# Patient Record
Sex: Female | Born: 1935
Health system: Southern US, Community
[De-identification: ages and names within clinical notes are randomized; demographics above are authoritative.]

## PROBLEM LIST (undated history)

## (undated) DIAGNOSIS — D131 Benign neoplasm of stomach: Secondary | ICD-10-CM

## (undated) DIAGNOSIS — M79606 Pain in leg, unspecified: Secondary | ICD-10-CM

## (undated) DIAGNOSIS — E039 Hypothyroidism, unspecified: Secondary | ICD-10-CM

## (undated) DIAGNOSIS — J40 Bronchitis, not specified as acute or chronic: Secondary | ICD-10-CM

## (undated) DIAGNOSIS — I10 Essential (primary) hypertension: Secondary | ICD-10-CM

## (undated) DIAGNOSIS — D649 Anemia, unspecified: Secondary | ICD-10-CM

## (undated) HISTORY — PX: NASAL SINUS SURGERY: SHX719

## (undated) HISTORY — PX: OTHER SURGICAL HISTORY: SHX169

---

## 1998-07-16 ENCOUNTER — Emergency Department (HOSPITAL_COMMUNITY): Admission: EM | Admit: 1998-07-16 | Discharge: 1998-07-16 | Payer: Self-pay | Admitting: Emergency Medicine

## 1999-11-30 ENCOUNTER — Encounter: Payer: Self-pay | Admitting: Family Medicine

## 1999-11-30 ENCOUNTER — Observation Stay (HOSPITAL_COMMUNITY): Admission: EM | Admit: 1999-11-30 | Discharge: 1999-12-01 | Payer: Self-pay | Admitting: Emergency Medicine

## 2000-09-25 ENCOUNTER — Emergency Department (HOSPITAL_COMMUNITY): Admission: EM | Admit: 2000-09-25 | Discharge: 2000-09-25 | Payer: Self-pay | Admitting: Emergency Medicine

## 2000-09-25 ENCOUNTER — Encounter: Payer: Self-pay | Admitting: Emergency Medicine

## 2000-11-03 ENCOUNTER — Encounter: Payer: Self-pay | Admitting: Emergency Medicine

## 2000-11-03 ENCOUNTER — Emergency Department (HOSPITAL_COMMUNITY): Admission: EM | Admit: 2000-11-03 | Discharge: 2000-11-03 | Payer: Self-pay | Admitting: Emergency Medicine

## 2002-02-09 ENCOUNTER — Encounter: Payer: Self-pay | Admitting: Emergency Medicine

## 2002-02-09 ENCOUNTER — Emergency Department (HOSPITAL_COMMUNITY): Admission: EM | Admit: 2002-02-09 | Discharge: 2002-02-09 | Payer: Self-pay | Admitting: Internal Medicine

## 2002-03-10 ENCOUNTER — Ambulatory Visit (HOSPITAL_COMMUNITY): Admission: RE | Admit: 2002-03-10 | Discharge: 2002-03-10 | Payer: Self-pay | Admitting: Cardiology

## 2002-03-10 ENCOUNTER — Encounter: Payer: Self-pay | Admitting: Cardiology

## 2002-11-09 ENCOUNTER — Inpatient Hospital Stay (HOSPITAL_COMMUNITY): Admission: EM | Admit: 2002-11-09 | Discharge: 2002-11-12 | Payer: Self-pay

## 2002-11-09 ENCOUNTER — Encounter: Payer: Self-pay | Admitting: Emergency Medicine

## 2007-07-15 ENCOUNTER — Ambulatory Visit (HOSPITAL_COMMUNITY): Admission: RE | Admit: 2007-07-15 | Discharge: 2007-07-15 | Payer: Self-pay | Admitting: Cardiology

## 2010-09-18 ENCOUNTER — Inpatient Hospital Stay (HOSPITAL_COMMUNITY): Admission: EM | Admit: 2010-09-18 | Discharge: 2010-09-27 | Payer: Self-pay | Admitting: Emergency Medicine

## 2010-09-21 ENCOUNTER — Encounter (INDEPENDENT_AMBULATORY_CARE_PROVIDER_SITE_OTHER): Payer: Self-pay | Admitting: Cardiology

## 2010-11-12 ENCOUNTER — Emergency Department (HOSPITAL_COMMUNITY)
Admission: EM | Admit: 2010-11-12 | Discharge: 2010-11-13 | Payer: Self-pay | Source: Home / Self Care | Admitting: Emergency Medicine

## 2011-02-06 ENCOUNTER — Other Ambulatory Visit: Payer: Self-pay | Admitting: Gastroenterology

## 2011-02-14 LAB — URINALYSIS, ROUTINE W REFLEX MICROSCOPIC
Ketones, ur: NEGATIVE mg/dL
Nitrite: NEGATIVE
Protein, ur: NEGATIVE mg/dL
pH: 7 (ref 5.0–8.0)

## 2011-02-14 LAB — POCT I-STAT, CHEM 8
Calcium, Ion: 1.21 mmol/L (ref 1.12–1.32)
Chloride: 105 mEq/L (ref 96–112)
HCT: 36 % (ref 36.0–46.0)
TCO2: 25 mmol/L (ref 0–100)

## 2011-02-15 LAB — TYPE AND SCREEN
ABO/RH(D): O POS
Antibody Screen: NEGATIVE
Unit division: 0

## 2011-02-15 LAB — BASIC METABOLIC PANEL
BUN: 1 mg/dL — ABNORMAL LOW (ref 6–23)
BUN: 2 mg/dL — ABNORMAL LOW (ref 6–23)
BUN: 2 mg/dL — ABNORMAL LOW (ref 6–23)
BUN: 5 mg/dL — ABNORMAL LOW (ref 6–23)
Calcium: 8.5 mg/dL (ref 8.4–10.5)
Calcium: 8.7 mg/dL (ref 8.4–10.5)
Calcium: 8.8 mg/dL (ref 8.4–10.5)
Calcium: 9 mg/dL (ref 8.4–10.5)
Calcium: 9.1 mg/dL (ref 8.4–10.5)
Chloride: 107 mEq/L (ref 96–112)
Chloride: 111 mEq/L (ref 96–112)
Chloride: 112 mEq/L (ref 96–112)
Creatinine, Ser: 0.92 mg/dL (ref 0.4–1.2)
Creatinine, Ser: 0.94 mg/dL (ref 0.4–1.2)
Creatinine, Ser: 1.02 mg/dL (ref 0.4–1.2)
Creatinine, Ser: 1.26 mg/dL — ABNORMAL HIGH (ref 0.4–1.2)
GFR calc Af Amer: 47 mL/min — ABNORMAL LOW (ref >60)
GFR calc Af Amer: 49 mL/min — ABNORMAL LOW (ref >60)
GFR calc Af Amer: 56 mL/min — ABNORMAL LOW (ref >60)
GFR calc Af Amer: >60 mL/min (ref >60)
GFR calc non Af Amer: 39 mL/min — ABNORMAL LOW (ref >60)
GFR calc non Af Amer: 47 mL/min — ABNORMAL LOW (ref >60)
GFR calc non Af Amer: 52 mL/min — ABNORMAL LOW (ref >60)
GFR calc non Af Amer: 53 mL/min — ABNORMAL LOW (ref >60)
GFR calc non Af Amer: 57 mL/min — ABNORMAL LOW (ref >60)
GFR calc non Af Amer: 59 mL/min — ABNORMAL LOW (ref >60)
Glucose, Bld: 108 mg/dL — ABNORMAL HIGH (ref 70–99)
Glucose, Bld: 119 mg/dL — ABNORMAL HIGH (ref 70–99)
Glucose, Bld: 91 mg/dL (ref 70–99)
Potassium: 3.4 mEq/L — ABNORMAL LOW (ref 3.5–5.1)
Potassium: 3.7 mEq/L (ref 3.5–5.1)
Potassium: 3.8 mEq/L (ref 3.5–5.1)
Sodium: 140 mEq/L (ref 135–145)
Sodium: 141 mEq/L (ref 135–145)
Sodium: 143 mEq/L (ref 135–145)

## 2011-02-15 LAB — CBC
HCT: 25.1 % — ABNORMAL LOW (ref 36.0–46.0)
HCT: 25.4 % — ABNORMAL LOW (ref 36.0–46.0)
HCT: 27 % — ABNORMAL LOW (ref 36.0–46.0)
Hemoglobin: 8.5 g/dL — ABNORMAL LOW (ref 12.0–15.0)
Hemoglobin: 8.5 g/dL — ABNORMAL LOW (ref 12.0–15.0)
Hemoglobin: 8.6 g/dL — ABNORMAL LOW (ref 12.0–15.0)
Hemoglobin: 9.1 g/dL — ABNORMAL LOW (ref 12.0–15.0)
MCH: 28.9 pg (ref 26.0–34.0)
MCHC: 32.7 g/dL (ref 30.0–36.0)
MCHC: 32.9 g/dL (ref 30.0–36.0)
MCHC: 33.1 g/dL (ref 30.0–36.0)
MCHC: 33.3 g/dL (ref 30.0–36.0)
MCHC: 33.6 g/dL (ref 30.0–36.0)
MCV: 87.4 fL (ref 78.0–100.0)
MCV: 88.5 fL (ref 78.0–100.0)
MCV: 88.9 fL (ref 78.0–100.0)
MCV: 89 fL (ref 78.0–100.0)
Platelets: 132 10*3/uL — ABNORMAL LOW (ref 150–400)
Platelets: 135 10*3/uL — ABNORMAL LOW (ref 150–400)
Platelets: 156 10*3/uL (ref 150–400)
Platelets: 207 10*3/uL (ref 150–400)
Platelets: 248 10*3/uL (ref 150–400)
RBC: 2.37 MIL/uL — ABNORMAL LOW (ref 3.87–5.11)
RBC: 2.87 MIL/uL — ABNORMAL LOW (ref 3.87–5.11)
RBC: 2.93 MIL/uL — ABNORMAL LOW (ref 3.87–5.11)
RBC: 3.09 MIL/uL — ABNORMAL LOW (ref 3.87–5.11)
RDW: 14.5 % (ref 11.5–15.5)
RDW: 14.7 % (ref 11.5–15.5)
RDW: 14.9 % (ref 11.5–15.5)
RDW: 15 % (ref 11.5–15.5)
WBC: 10.4 10*3/uL (ref 4.0–10.5)
WBC: 11.2 10*3/uL — ABNORMAL HIGH (ref 4.0–10.5)
WBC: 7.8 10*3/uL (ref 4.0–10.5)
WBC: 9.4 10*3/uL (ref 4.0–10.5)
WBC: 9.6 10*3/uL (ref 4.0–10.5)

## 2011-02-15 LAB — DIFFERENTIAL
Eosinophils Relative: 1 % (ref 0–5)
Lymphocytes Relative: 11 % — ABNORMAL LOW (ref 12–46)
Monocytes Absolute: 0.3 10*3/uL (ref 0.1–1.0)
Monocytes Relative: 3 % (ref 3–12)
Neutrophils Relative %: 85 % — ABNORMAL HIGH (ref 43–77)

## 2011-02-15 LAB — URINE MICROSCOPIC-ADD ON

## 2011-02-15 LAB — URINALYSIS, ROUTINE W REFLEX MICROSCOPIC
Bilirubin Urine: NEGATIVE
Ketones, ur: NEGATIVE mg/dL
Nitrite: NEGATIVE
Protein, ur: NEGATIVE mg/dL
Specific Gravity, Urine: 1.017 (ref 1.005–1.030)
Urobilinogen, UA: 0.2 mg/dL (ref 0.0–1.0)

## 2011-02-15 LAB — APTT: aPTT: 33 seconds (ref 24–37)

## 2011-02-15 LAB — POCT CARDIAC MARKERS: Myoglobin, poc: 102 ng/mL (ref 12–200)

## 2011-02-15 LAB — RENAL FUNCTION PANEL
Albumin: 2.8 g/dL — ABNORMAL LOW (ref 3.5–5.2)
BUN: 28 mg/dL — ABNORMAL HIGH (ref 6–23)
Chloride: 111 mEq/L (ref 96–112)
Glucose, Bld: 107 mg/dL — ABNORMAL HIGH (ref 70–99)
Potassium: 3.8 mEq/L (ref 3.5–5.1)
Sodium: 139 mEq/L (ref 135–145)

## 2011-02-15 LAB — MAGNESIUM
Magnesium: 1.4 mg/dL — ABNORMAL LOW (ref 1.5–2.5)
Magnesium: 1.7 mg/dL (ref 1.5–2.5)

## 2011-02-15 LAB — MRSA PCR SCREENING: MRSA by PCR: NEGATIVE

## 2011-02-15 LAB — PROTIME-INR: INR: 1.15 (ref 0.00–1.49)

## 2011-02-15 LAB — ABO/RH: ABO/RH(D): O POS

## 2011-02-15 LAB — PHOSPHORUS: Phosphorus: 3.1 mg/dL (ref 2.3–4.6)

## 2011-04-21 NOTE — Discharge Summary (Signed)
NAME:  Monique Lin, Monique Lin                            ACCOUNT NO.:  [id number]   MEDICAL RECORD NO.:  [id number]                   PATIENT TYPE:  INP   LOCATION:  5729                                 FACILITY:  [facility]   PHYSICIAN:  Mohan N. [name], M.D.              DATE OF BIRTH:  1927-04-10   DATE OF ADMISSION:  11/09/2002  DATE OF DISCHARGE:  11/12/2002                                 DISCHARGE SUMMARY   ADMISSION DIAGNOSES:  1. Exacerbation of asthmatic bronchitis.  2. Hypertension.  3. Morbid obesity.   FINAL DIAGNOSES:  1. Status post exacerbation of asthmatic bronchitis.  2. Hypertension.  3. Hypercholesterolemia.  4. Morbid obesity.  5. Hypothyroidism.  6. Elevated blood sugar, rule out diabetes mellitus.   DISCHARGE MEDICATIONS:  1. Hyzaar 100/25 mg one tablet daily.  2. Cardura 2 mg half tablet daily at night for one week and then one tablet     daily at night.  3. Synthroid 0.05 mg one tablet daily.  4. Lipitor 10 mg one tablet daily.  5. Advair Diskus 100/50 mcg one inhalation twice daily.  6. Prednisone 10 mg one tablet twice daily for two days, then one tablet     daily for two days, and then half tablet daily for one week.  7. Tequin 400 mg one tablet daily for five days.   ACTIVITY:  As tolerated.   DIET:  Low salt, low cholesterol, 1800 calorie ADA diet.  The patient has  been advised to avoid sweets.   FOLLOW UP:  The patient will follow up with me in one week.   CONDITION ON DISCHARGE:  Stable.   HISTORY OF PRESENT ILLNESS:  The patient is a 77 year old black female with  past medical history significant for hypertension, hypercholesterolemia,  hypothyroidism, morbid obesity, history of asthmatic bronchitis in the past.  She was admitted by Dr. Doylene Canard on November 09, 2002, with shortness of  breath associated with pleuritic chest pain, cough, and cold.  The patient  denies any angina, chest pain, denies any nausea or vomiting, diaphoresis.  She denies  fever or chills.   PAST MEDICAL HISTORY:  As above.   PAST SURGICAL HISTORY:  She had tubal ligation in 39 years ago.   HOME MEDICATIONS:  1. Coreg 3.125 mg b.i.d.  2. Hyzaar 50/12.5 mg p.o. q.d.  3. Synthroid.  4. Albuterol inhaler.   ALLERGIES:  She is allergic to CODEINE, she has stomach cramps.   SOCIAL HISTORY:  She is widowed.  No history of smoking. She is retired.   FAMILY HISTORY:  Mother died of MI at the age of 35.  Father not known.   PHYSICAL EXAMINATION:  GENERAL APPEARANCE:  She was awake, alert and  oriented x3.  VITAL SIGNS:  Blood pressure 152/68, pulse 93.  HEENT:  Conjunctivae pink.  NECK:  No JVD.  LUNGS:  Clear.  CARDIOVASCULAR:  S1  and S2 was normal.  ABDOMEN:  Soft.  EXTREMITIES:  There was no clubbing or cyanosis.  There was trace edema.  NEUROLOGIC:  Grossly intact.   LABORATORY AND ACCESSORY DATA:  Chest x-ray showed hyperinflation of lungs  and bronchial wall thickening comparable with chronic bronchitis. Hemoglobin  12.6, hematocrit 37.6, white count 5.7.  Glucose 114, BUN 14, creatinine  1.9.  Blood cultures were negative.  EKG showed normal sinus rhythm.   HOSPITAL COURSE:  The patient was admitted to the regular floor and was  started on IV antibiotics and steroids and beta agonist with improvement in  her symptoms.  IV antibiotics were switched to p.o. Tequin.  The patient did  not have any episodes of fever or wheezing during the hospital stay. Steroid  dose gradually being tapered off.  The patient will be discharged home on  the above medications and will be followed up in my office in one week.                                                Allegra Lai. [name], M.D.    MNH/MEDQ  D:  11/12/2002  T:  11/12/2002  Job:  [id number]

## 2012-10-14 ENCOUNTER — Emergency Department (HOSPITAL_COMMUNITY)
Admission: EM | Admit: 2012-10-14 | Discharge: 2012-10-14 | Disposition: A | Discharge status: Home / Self Care | Payer: Medicare Other | Attending: Emergency Medicine | Admitting: Emergency Medicine

## 2012-10-14 ENCOUNTER — Encounter (HOSPITAL_COMMUNITY): Payer: Self-pay

## 2012-10-14 DIAGNOSIS — Z79899 Other long term (current) drug therapy: Secondary | ICD-10-CM | POA: Insufficient documentation

## 2012-10-14 DIAGNOSIS — IMO0002 Reserved for concepts with insufficient information to code with codable children: Secondary | ICD-10-CM | POA: Insufficient documentation

## 2012-10-14 DIAGNOSIS — J40 Bronchitis, not specified as acute or chronic: Secondary | ICD-10-CM | POA: Insufficient documentation

## 2012-10-14 DIAGNOSIS — M79609 Pain in unspecified limb: Secondary | ICD-10-CM | POA: Insufficient documentation

## 2012-10-14 DIAGNOSIS — M5416 Radiculopathy, lumbar region: Secondary | ICD-10-CM

## 2012-10-14 DIAGNOSIS — Z7982 Long term (current) use of aspirin: Secondary | ICD-10-CM | POA: Insufficient documentation

## 2012-10-14 DIAGNOSIS — R609 Edema, unspecified: Secondary | ICD-10-CM | POA: Insufficient documentation

## 2012-10-14 DIAGNOSIS — Z8709 Personal history of other diseases of the respiratory system: Secondary | ICD-10-CM | POA: Insufficient documentation

## 2012-10-14 DIAGNOSIS — M79606 Pain in leg, unspecified: Secondary | ICD-10-CM

## 2012-10-14 DIAGNOSIS — I10 Essential (primary) hypertension: Secondary | ICD-10-CM | POA: Insufficient documentation

## 2012-10-14 HISTORY — DX: Bronchitis, not specified as acute or chronic: J40

## 2012-10-14 HISTORY — DX: Essential (primary) hypertension: I10

## 2012-10-14 MED ORDER — PREDNISONE 20 MG PO TABS: 20.0000 mg | ORAL_TABLET | Freq: Every day | ORAL | Status: DC

## 2012-10-14 NOTE — ED Notes (Addendum)
Pt transported to vascular.  °

## 2012-10-14 NOTE — ED Provider Notes (Signed)
History     CSN: [id number]  Arrival date & time 10/14/12  1349   First MD Initiated Contact with Patient 10/14/12 1751      Chief Complaint  Patient presents with  . Leg Pain    (Consider location/radiation/quality/duration/timing/severity/associated sxs/prior treatment) HPI Comments: 78 year old female with a history of bronchitis and hypertension presents emergency department complaining of left leg pain.  Onset of symptoms began around November 1 and are described as calf cramping.  Pain radiates from calf up to the back of thigh and is worse with walking and weight bearing.  Associated symptoms include bilateral leg swelling. Patient has been evaluated by her primary care physician Dr. [name] who has sent patient to the emergency department to rule out DVT with venous Doppler.  Patient denies any chest pain, shortness of breath, palpitations, cough, hemoptysis, fever, night sweats, chills, recent travel, replacement therapy.  No other complaints at this time.  The history is provided by the patient.    Past Medical History  Diagnosis Date  . Hypertension   . Bronchitis     History reviewed. No pertinent past surgical history.  History reviewed. No pertinent family history.  History  Substance Use Topics  . Smoking status: Never Smoker   . Smokeless tobacco: Not on file  . Alcohol Use: No    OB History    Grav Para Term Preterm Abortions TAB SAB Ect Mult Living                  Review of Systems  All other systems reviewed and are negative.    Allergies  Codeine  Home Medications   Current Outpatient Rx  Name  Route  Sig  Dispense  Refill  . AMLODIPINE BESYLATE 5 MG PO TABS   Oral   Take 5 mg by mouth daily.         . ASPIRIN 81 MG PO CHEW   Oral   Chew 81 mg by mouth daily.         . ATORVASTATIN CALCIUM 20 MG PO TABS   Oral   Take 20 mg by mouth daily.         Marland Kitchen CARVEDILOL 25 MG PO TABS   Oral   Take 25 mg by mouth 2 (two) times daily  with a meal.         . DOXAZOSIN MESYLATE 4 MG PO TABS   Oral   Take 4 mg by mouth at bedtime.         Marland Kitchen GABAPENTIN 300 MG PO CAPS   Oral   Take 300 mg by mouth 2 (two) times daily.         Marland Kitchen LEVOTHYROXINE SODIUM 50 MCG PO TABS   Oral   Take 50 mcg by mouth daily.         Marland Kitchen LOSARTAN POTASSIUM 25 MG PO TABS   Oral   Take 25 mg by mouth daily.         Marland Kitchen RAMIPRIL 10 MG PO CAPS   Oral   Take 10 mg by mouth 2 (two) times daily.           BP 146/76  Pulse 79  Temp 98.4 F (36.9 C) (Oral)  Resp 18  SpO2 95%  Physical Exam  Nursing note and vitals reviewed. Constitutional: She appears well-developed and well-nourished. No distress.  HENT:  Head: Normocephalic and atraumatic.  Eyes: Conjunctivae normal and EOM are normal. Pupils are equal, round, and reactive to  light.  Neck: Normal range of motion. Neck supple. Normal carotid pulses and no JVD present. Carotid bruit is not present. No rigidity. Normal range of motion present.  Cardiovascular: Normal rate, regular rhythm, S1 normal, S2 normal, normal heart sounds, intact distal pulses and normal pulses.  Exam reveals no gallop and no friction rub.   No murmur heard.      2+ pitting edema bilaterally, RRR, no aberrant sounds on auscultations, distal pulses intact, no carotid bruit or JVD.   Pulmonary/Chest: Effort normal and breath sounds normal. No accessory muscle usage or stridor. No respiratory distress. She exhibits no tenderness and no bony tenderness.  Abdominal: Bowel sounds are normal.       Soft non tender. Non pulsatile aorta.   Musculoskeletal:       Tenderness to palpation over left calf, popliteal area and posterior thigh.  No pain with range of motion.  Skin: Skin is warm, dry and intact. No rash noted. She is not diaphoretic. No cyanosis. Nails show no clubbing.    ED Course  Procedures (including critical care time)  Labs Reviewed - No data to display No results found.   No diagnosis  found.  VASCULAR LAB  PRELIMINARY PRELIMINARY PRELIMINARY PRELIMINARY  Left lower extremity venous duplex completed.  Preliminary report: Left leg is negative for deep and superficial vein thrombosis. Negative for Baker's cyst on left.  NICHOLS, FRANCES,  10/14/2012, 6:11 PM   MDM  Left leg pain, ? Lumbar radiculopathy , swelling  78 year old female presented to ER her [facility] care physician recommendation (Dr. [name]) for a venous Doppler to rule out DVT of left lower extremity.  As above no DVT seen.  Patient is to followup with primary care for other possible etiologies of left leg pain.  Patient appears reliable source for followup and is agreeable with plan of discharge.  Dc w 20 mg pred x 5 days for back pain.        Verl Dicker, PA-C 10/14/12 [place], PA-C 10/14/12 [id number]

## 2012-10-14 NOTE — ED Notes (Signed)
Pt c/o pain in the back of her calf radiating up to her lower back x4 weeks. States it is a 3/10 when laying in bed and an 8/10 when she stands up.

## 2012-10-14 NOTE — Progress Notes (Signed)
VASCULAR LAB PRELIMINARY  PRELIMINARY  PRELIMINARY  PRELIMINARY  Left lower extremity venous duplex completed.    Preliminary report:  Left leg is negative for deep and superficial vein thrombosis.  Negative for Baker's cyst on left.  Emmi Wertheim, [place], 10/14/2012, 6:11 PM

## 2012-10-14 NOTE — ED Notes (Signed)
Pt c/o left leg cramping and pain x several weeks intermittently; pt sent here by Dr [name] for eval and venous doppler

## 2012-10-14 NOTE — ED Notes (Signed)
Pt returned from vascular

## 2012-10-14 NOTE — ED Provider Notes (Signed)
Monique Lin is a 78 y.o. female  who is here for a tight feeling in her left leg. No recent trauma. She takes Tylenol with improvement of the pain. On exam, the left leg is nontender to patient. There is mild peripheral edema bh lower legs. Normal range of motion of the low back..  Evaluation is consistent with lumbar radiculopathy, mild. No associated symptoms to be concerning for cauda equina syndrome. She is stable for discharge   Medical screening examination/treatment/procedure(s) were conducted as a shared visit with non-physician practitioner(s) and myself.  I personally evaluated the patient during the encounter  Richarda Blade, MD 10/15/12 [phone]

## 2012-12-19 ENCOUNTER — Emergency Department (HOSPITAL_COMMUNITY): Payer: Medicare Other

## 2012-12-19 ENCOUNTER — Encounter (HOSPITAL_COMMUNITY): Payer: Self-pay | Admitting: Emergency Medicine

## 2012-12-19 DIAGNOSIS — Z7982 Long term (current) use of aspirin: Secondary | ICD-10-CM | POA: Insufficient documentation

## 2012-12-19 DIAGNOSIS — J40 Bronchitis, not specified as acute or chronic: Secondary | ICD-10-CM | POA: Insufficient documentation

## 2012-12-19 DIAGNOSIS — I10 Essential (primary) hypertension: Secondary | ICD-10-CM | POA: Insufficient documentation

## 2012-12-19 DIAGNOSIS — Z79899 Other long term (current) drug therapy: Secondary | ICD-10-CM | POA: Insufficient documentation

## 2012-12-19 NOTE — ED Notes (Addendum)
Pt st's she has bronchitis and it has gotton worse over past 1-2 weeks.  Productive cough onset earlier tonight. Pt speaking in full sentences

## 2012-12-20 ENCOUNTER — Emergency Department (HOSPITAL_COMMUNITY)
Admission: EM | Admit: 2012-12-20 | Discharge: 2012-12-20 | Disposition: A | Discharge status: Home / Self Care | Payer: Medicare Other | Attending: Emergency Medicine | Admitting: Emergency Medicine

## 2012-12-20 DIAGNOSIS — J40 Bronchitis, not specified as acute or chronic: Secondary | ICD-10-CM

## 2012-12-20 LAB — CBC
Hemoglobin: 11.8 g/dL — ABNORMAL LOW (ref 12.0–15.0)
MCH: 30.1 pg (ref 26.0–34.0)
MCV: 87 fL (ref 78.0–100.0)
RBC: 3.92 MIL/uL (ref 3.87–5.11)

## 2012-12-20 LAB — POCT I-STAT, CHEM 8
BUN: 20 mg/dL (ref 6–23)
Calcium, Ion: 1.3 mmol/L (ref 1.13–1.30)
Creatinine, Ser: 1.2 mg/dL — ABNORMAL HIGH (ref 0.50–1.10)
TCO2: 23 mmol/L (ref 0–100)

## 2012-12-20 MED ORDER — PREDNISONE 20 MG PO TABS
60.0000 mg | ORAL_TABLET | Freq: Once | ORAL | Status: AC
  Administered 2012-12-20: 60 mg via ORAL
  Filled 2012-12-20: qty 3

## 2012-12-20 MED ORDER — PREDNISONE 10 MG PO TABS: 50.0000 mg | ORAL_TABLET | Freq: Every day | ORAL | Status: DC

## 2012-12-20 MED ORDER — ALBUTEROL SULFATE (5 MG/ML) 0.5% IN NEBU
2.5000 mg | INHALATION_SOLUTION | Freq: Once | RESPIRATORY_TRACT | Status: AC
  Administered 2012-12-20: 2.5 mg via RESPIRATORY_TRACT
  Filled 2012-12-20: qty 20

## 2012-12-20 NOTE — ED Provider Notes (Addendum)
History     CSN: [id number]  Arrival date & time 12/19/12  2305   First MD Initiated Contact with Patient 12/20/12 0022      Chief Complaint  Patient presents with  . Cough    (Consider location/radiation/quality/duration/timing/severity/associated sxs/prior treatment) Patient is a 79 y.o. female presenting with cough. The history is provided by the patient.  Cough This is a recurrent problem. The current episode started 3 to 5 hours ago. The problem occurs constantly. The cough is non-productive. Pertinent negatives include no chest pain, no chills and no sweats. Treatments tried: albuterol neb. The treatment provided no relief. She is not a smoker. Her past medical history is significant for bronchitis.    Past Medical History  Diagnosis Date  . Hypertension   . Bronchitis     History reviewed. No pertinent past surgical history.  No family history on file.  History  Substance Use Topics  . Smoking status: Never Smoker   . Smokeless tobacco: Not on file  . Alcohol Use: No    OB History    Grav Para Term Preterm Abortions TAB SAB Ect Mult Living                  Review of Systems  Constitutional: Negative for chills.  Respiratory: Positive for cough.   Cardiovascular: Negative for chest pain.  All other systems reviewed and are negative.    Allergies  Codeine  Home Medications   Current Outpatient Rx  Name  Route  Sig  Dispense  Refill  . AMLODIPINE BESYLATE 5 MG PO TABS   Oral   Take 5 mg by mouth daily.         . ASPIRIN 81 MG PO CHEW   Oral   Chew 81 mg by mouth daily.         . ATORVASTATIN CALCIUM 20 MG PO TABS   Oral   Take 20 mg by mouth daily.         Marland Kitchen CARVEDILOL 25 MG PO TABS   Oral   Take 25 mg by mouth 2 (two) times daily with a meal.         . DOXAZOSIN MESYLATE 4 MG PO TABS   Oral   Take 4 mg by mouth at bedtime.         Marland Kitchen GABAPENTIN 300 MG PO CAPS   Oral   Take 300 mg by mouth 2 (two) times daily.         Marland Kitchen  LEVOTHYROXINE SODIUM 50 MCG PO TABS   Oral   Take 50 mcg by mouth daily.         Marland Kitchen LOSARTAN POTASSIUM 25 MG PO TABS   Oral   Take 25 mg by mouth daily.         Marland Kitchen RAMIPRIL 10 MG PO CAPS   Oral   Take 10 mg by mouth 2 (two) times daily.           BP 116/98  Pulse 99  Temp 98.4 F (36.9 C) (Oral)  Resp 20  SpO2 100%  Physical Exam  Constitutional: She is oriented to person, place, and time. She appears well-developed and well-nourished.  HENT:  Head: Normocephalic and atraumatic.  Eyes: Conjunctivae normal and EOM are normal. Pupils are equal, round, and reactive to light.  Neck: Normal range of motion.  Cardiovascular: Normal rate, regular rhythm and normal heart sounds.   Pulmonary/Chest: Effort normal and breath sounds normal.  Abdominal: Soft. Bowel sounds  are normal.  Musculoskeletal: Normal range of motion.  Neurological: She is alert and oriented to person, place, and time.  Skin: Skin is warm and dry.  Psychiatric: She has a normal mood and affect. Her behavior is normal.    ED Course  Procedures (including critical care time)   Labs Reviewed  CBC   Dg Chest 2 View  12/20/2012  *RADIOLOGY REPORT*  Clinical Data: Cough, shortness of breath.  CHEST - 2 VIEW  Comparison: 09/18/2010  Findings: Mild cardiomegaly.  Tortuous atheromatous aorta.  Stable linear scarring or subsegmental atelectasis at the left lung base. No new infiltrate or overt edema.  No effusion.  Mild hyperinflation.  Spurring in the lower thoracic spine.  IMPRESSION:  1.  Stable cardiomegaly.  No acute disease.   Original Report Authenticated By: D. Wallace Going, MD      No diagnosis found.    Date: 12/20/2012  Rate: 81  Rhythm: normal sinus rhythm  QRS Axis: normal  Intervals: normal  ST/T Wave abnormalities: normal  Conduction Disutrbances: none  Narrative Interpretation: unremarkable    MDM  + bronchitis,  Will check labs,  Xray,  reassess  Improved.  Will dc to fu with  Pmd,   Ret new/worsening sxs     Kiarra Kidd Ferne Reus, MD 12/20/12 0129  Oaklen Thiam Ferne Reus, MD 12/20/12 [place], MD 12/20/12 [phone]

## 2012-12-20 NOTE — ED Notes (Signed)
Pt. States "I'm having a bronchitis flare up. I've had bronchitis for 25 years". Pt. States she used nebulizer x4 with no relief. Pt. Breathing labored, o2 100% RA.

## 2012-12-20 NOTE — ED Notes (Signed)
Pt. States "I feel like I can breathe a lot better."

## 2012-12-21 ENCOUNTER — Encounter (HOSPITAL_COMMUNITY): Payer: Self-pay | Admitting: *Deleted

## 2012-12-21 DIAGNOSIS — H9319 Tinnitus, unspecified ear: Secondary | ICD-10-CM | POA: Insufficient documentation

## 2012-12-21 DIAGNOSIS — I10 Essential (primary) hypertension: Secondary | ICD-10-CM | POA: Insufficient documentation

## 2012-12-21 DIAGNOSIS — R42 Dizziness and giddiness: Secondary | ICD-10-CM | POA: Insufficient documentation

## 2012-12-21 DIAGNOSIS — Z8709 Personal history of other diseases of the respiratory system: Secondary | ICD-10-CM | POA: Insufficient documentation

## 2012-12-21 DIAGNOSIS — Z79899 Other long term (current) drug therapy: Secondary | ICD-10-CM | POA: Insufficient documentation

## 2012-12-21 DIAGNOSIS — Z7982 Long term (current) use of aspirin: Secondary | ICD-10-CM | POA: Insufficient documentation

## 2012-12-21 NOTE — ED Notes (Signed)
Dizzy since last Tuesday, loosing in balance; off/on. Comes and goes.  ringing in the ear; having bronchitis for x 2 weeks.

## 2012-12-21 NOTE — ED Notes (Signed)
Patient asked for family to be in room with her.

## 2012-12-22 ENCOUNTER — Emergency Department (HOSPITAL_COMMUNITY)
Admission: EM | Admit: 2012-12-22 | Discharge: 2012-12-22 | Disposition: A | Discharge status: Home / Self Care | Payer: Medicare Other | Attending: Emergency Medicine | Admitting: Emergency Medicine

## 2012-12-22 ENCOUNTER — Emergency Department (HOSPITAL_COMMUNITY): Payer: Medicare Other

## 2012-12-22 DIAGNOSIS — R42 Dizziness and giddiness: Secondary | ICD-10-CM

## 2012-12-22 DIAGNOSIS — H9319 Tinnitus, unspecified ear: Secondary | ICD-10-CM

## 2012-12-22 LAB — POCT I-STAT, CHEM 8
BUN: 20 mg/dL (ref 6–23)
Calcium, Ion: 1.27 mmol/L (ref 1.13–1.30)
Chloride: 108 mEq/L (ref 96–112)
Glucose, Bld: 108 mg/dL — ABNORMAL HIGH (ref 70–99)

## 2012-12-22 MED ORDER — MECLIZINE HCL 50 MG PO TABS: 50.0000 mg | ORAL_TABLET | Freq: Three times a day (TID) | ORAL | Status: DC | PRN

## 2012-12-22 MED ORDER — MECLIZINE HCL 12.5 MG PO TABS: 25.0000 mg | ORAL_TABLET | Freq: Three times a day (TID) | ORAL | Status: DC | PRN

## 2012-12-22 NOTE — ED Notes (Signed)
Patient transported to CT 

## 2012-12-22 NOTE — ED Provider Notes (Signed)
History     CSN: [id number]  Arrival date & time 12/21/12  2152   First MD Initiated Contact with Patient 12/22/12 0031      Chief Complaint  Patient presents with  . Dizziness  . Tinnitus    (Consider location/radiation/quality/duration/timing/severity/associated sxs/prior treatment) HPI Comments: 79 year old female who presents with a complaint of dizziness described as "swimmy headedness" as well as a feeling of fullness behind her bilateral ears and tinnitus. This has been ongoing for approximately one week, she has been having difficulty with maintaining her balance occasionally but this is short lived and she is able to ambulate with a cane despite this feeling. She has no changes in her speech, changes in her vision and denies any focal weakness or numbness to her arms or legs. She does not have any history of stroke though she does have a history of bronchitis for which she was recently seen and treated approximately 36 hours ago.  Her symptoms are mild, intermittent, nothing seems to make it better or worse. She denies vertigo and denies positional changes.  The history is provided by the patient.    Past Medical History  Diagnosis Date  . Hypertension   . Bronchitis     History reviewed. No pertinent past surgical history.  No family history on file.  History  Substance Use Topics  . Smoking status: Never Smoker   . Smokeless tobacco: Not on file  . Alcohol Use: No    OB History    Grav Para Term Preterm Abortions TAB SAB Ect Mult Living                  Review of Systems  All other systems reviewed and are negative.    Allergies  Codeine  Home Medications   Current Outpatient Rx  Name  Route  Sig  Dispense  Refill  . AMLODIPINE BESYLATE 5 MG PO TABS   Oral   Take 5 mg by mouth daily.         . ASPIRIN 81 MG PO CHEW   Oral   Chew 81 mg by mouth daily.         . ATORVASTATIN CALCIUM 20 MG PO TABS   Oral   Take 20 mg by mouth daily.        . AZITHROMYCIN 250 MG PO TABS   Oral   Take 250 mg by mouth daily.         Marland Kitchen CARVEDILOL 25 MG PO TABS   Oral   Take 25 mg by mouth 2 (two) times daily with a meal.         . VITAMIN B-12 PO   Oral   Take 1 tablet by mouth daily.         Marland Kitchen DOXAZOSIN MESYLATE 4 MG PO TABS   Oral   Take 4 mg by mouth at bedtime.         Marland Kitchen FOLIC ACID 1 MG PO TABS   Oral   Take 1 mg by mouth daily.         Marland Kitchen GABAPENTIN 300 MG PO CAPS   Oral   Take 300 mg by mouth 2 (two) times daily.         Marland Kitchen LEVOTHYROXINE SODIUM 50 MCG PO TABS   Oral   Take 50 mcg by mouth daily.         Marland Kitchen LOSARTAN POTASSIUM 25 MG PO TABS   Oral   Take 25 mg by mouth  daily.         Marland Kitchen PREDNISONE 10 MG PO TABS   Oral   Take 5 tablets (50 mg total) by mouth daily.   20 tablet   0   . RAMIPRIL 10 MG PO CAPS   Oral   Take 10 mg by mouth 2 (two) times daily.         Marland Kitchen MECLIZINE HCL 12.5 MG PO TABS   Oral   Take 2 tablets (25 mg total) by mouth 3 (three) times daily as needed.   30 tablet   0     BP 149/72  Pulse 83  Temp 97.9 F (36.6 C) (Oral)  Resp 18  SpO2 94%  Physical Exam  Nursing note and vitals reviewed. Constitutional: She appears well-developed and well-nourished. No distress.  HENT:  Head: Normocephalic and atraumatic.  Mouth/Throat: Oropharynx is clear and moist. No oropharyngeal exudate.       Oropharynx is clear, nostrils are clear, tympanic membranes are clear bilaterally without effusions infections erythema or bulging of the tympanic membranes.  Eyes: Conjunctivae normal and EOM are normal. Pupils are equal, round, and reactive to light. Right eye exhibits no discharge. Left eye exhibits no discharge. No scleral icterus.  Neck: Normal range of motion. Neck supple. No JVD present. No thyromegaly present.  Cardiovascular: Normal rate, regular rhythm, normal heart sounds and intact distal pulses.  Exam reveals no gallop and no friction rub.   No murmur  heard. Pulmonary/Chest: Effort normal and breath sounds normal. No respiratory distress. She has no wheezes. She has no rales.  Abdominal: Soft. Bowel sounds are normal. She exhibits no distension and no mass. There is no tenderness.  Musculoskeletal: Normal range of motion. She exhibits no edema and no tenderness.  Lymphadenopathy:    She has no cervical adenopathy.  Neurological: She is alert. Coordination normal.       Neurologic exam:  Speech clear, pupils equal round reactive to light, extraocular movements intact  Normal peripheral visual fields Cranial nerves III through XII normal including no facial droop Follows commands, moves all extremities x4, normal strength to bilateral upper and lower extremities at all major muscle groups including grip Sensation normal to light touch and pinprick Coordination intact, no limb ataxia, finger-nose-finger normal Rapid alternating movements normal No pronator drift Gait normal, I ambulated with her through the emergency department without any difficulty. No loss of balance. No dizziness.   Skin: Skin is warm and dry. No rash noted. No erythema.  Psychiatric: She has a normal mood and affect. Her behavior is normal.    ED Course  Procedures (including critical care time)  Labs Reviewed  POCT I-STAT, CHEM 8 - Abnormal; Notable for the following:    Glucose, Bld 108 (*)     All other components within normal limits   Ct Head Wo Contrast  12/22/2012  *RADIOLOGY REPORT*  Clinical Data: 79 year old female with severe headache.  CT HEAD WITHOUT CONTRAST  Technique:  Contiguous axial images were obtained from the base of the skull through the vertex without contrast.  Comparison: 11/13/2010 CT  Findings: Mild chronic small vessel white matter ischemic changes are again noted.  No acute intracranial abnormalities are identified, including mass lesion or mass effect, hydrocephalus, extra-axial fluid collection, midline shift, hemorrhage, or acute  infarction.  The visualized bony calvarium is unremarkable. Fluid within the maxillary and sphenoid sinuses noted with opacified left frontal sinus.  IMPRESSION: No evidence of acute intracranial abnormality.  Acute paranasal sinusitis.  Mild chronic small vessel white matter ischemic changes.   Original Report Authenticated By: Margarette Canada, M.D.      1. Tinnitus   2. Dizziness       MDM  Overall the patient appears well, she has mild hypertension but has intermittent neurologic symptoms which are likely related to her in her ear, possibly Mnire's disease. We'll obtain a CT scan and basic labs to rule out other sources of the patient's intermittent off-balance sensation.   The patient symptoms have not been present throughout her entire emergency department stay. Other than some tinnitus. She has a stable gait, CT scan and lab work normal, patient stable for discharge, meclizine when necessary dizziness. Followup with ENT, referral given     Johnna Acosta, MD 12/22/12 [phone]

## 2013-02-03 ENCOUNTER — Other Ambulatory Visit: Payer: Self-pay | Admitting: Otolaryngology

## 2013-02-03 DIAGNOSIS — J329 Chronic sinusitis, unspecified: Secondary | ICD-10-CM

## 2013-05-17 ENCOUNTER — Emergency Department (HOSPITAL_COMMUNITY)
Admission: EM | Admit: 2013-05-17 | Discharge: 2013-05-17 | Disposition: A | Discharge status: Home / Self Care | Payer: Medicare Other | Attending: Emergency Medicine | Admitting: Emergency Medicine

## 2013-05-17 ENCOUNTER — Encounter (HOSPITAL_COMMUNITY): Payer: Self-pay | Admitting: Emergency Medicine

## 2013-05-17 ENCOUNTER — Emergency Department (HOSPITAL_COMMUNITY): Payer: Medicare Other

## 2013-05-17 DIAGNOSIS — R059 Cough, unspecified: Secondary | ICD-10-CM | POA: Insufficient documentation

## 2013-05-17 DIAGNOSIS — Z8709 Personal history of other diseases of the respiratory system: Secondary | ICD-10-CM | POA: Insufficient documentation

## 2013-05-17 DIAGNOSIS — Z79899 Other long term (current) drug therapy: Secondary | ICD-10-CM | POA: Insufficient documentation

## 2013-05-17 DIAGNOSIS — R05 Cough: Secondary | ICD-10-CM | POA: Insufficient documentation

## 2013-05-17 DIAGNOSIS — I10 Essential (primary) hypertension: Secondary | ICD-10-CM | POA: Insufficient documentation

## 2013-05-17 DIAGNOSIS — J9801 Acute bronchospasm: Secondary | ICD-10-CM | POA: Insufficient documentation

## 2013-05-17 DIAGNOSIS — Z85028 Personal history of other malignant neoplasm of stomach: Secondary | ICD-10-CM | POA: Insufficient documentation

## 2013-05-17 DIAGNOSIS — J189 Pneumonia, unspecified organism: Secondary | ICD-10-CM | POA: Insufficient documentation

## 2013-05-17 DIAGNOSIS — Z7982 Long term (current) use of aspirin: Secondary | ICD-10-CM | POA: Insufficient documentation

## 2013-05-17 HISTORY — DX: Benign neoplasm of stomach: D13.1

## 2013-05-17 HISTORY — DX: Pain in leg, unspecified: M79.606

## 2013-05-17 MED ORDER — CEFDINIR 300 MG PO CAPS: 300.0000 mg | ORAL_CAPSULE | Freq: Two times a day (BID) | ORAL | Status: DC

## 2013-05-17 MED ORDER — ALBUTEROL SULFATE (5 MG/ML) 0.5% IN NEBU
2.5000 mg | INHALATION_SOLUTION | Freq: Once | RESPIRATORY_TRACT | Status: AC
  Administered 2013-05-17: 2.5 mg via RESPIRATORY_TRACT
  Filled 2013-05-17: qty 0.5

## 2013-05-17 MED ORDER — PREDNISONE 20 MG PO TABS: ORAL_TABLET | ORAL | Status: DC

## 2013-05-17 MED ORDER — ALBUTEROL SULFATE (5 MG/ML) 0.5% IN NEBU
5.0000 mg | INHALATION_SOLUTION | Freq: Once | RESPIRATORY_TRACT | Status: AC
  Administered 2013-05-17: 5 mg via RESPIRATORY_TRACT

## 2013-05-17 MED ORDER — ALBUTEROL SULFATE (5 MG/ML) 0.5% IN NEBU
INHALATION_SOLUTION | RESPIRATORY_TRACT | Status: AC
  Administered 2013-05-17: 5 mg via RESPIRATORY_TRACT
  Filled 2013-05-17: qty 1

## 2013-05-17 NOTE — ED Notes (Signed)
Respiratory in room.

## 2013-05-17 NOTE — ED Notes (Signed)
NAD noted at time of d/c home 

## 2013-05-17 NOTE — ED Notes (Signed)
Pt in room from XR. Dr.Pollina in room. Pt c/o congestion and cough.

## 2013-05-17 NOTE — ED Provider Notes (Signed)
History     CSN: [id number]  Arrival date & time 05/17/13  [id number]   First MD Initiated Contact with Patient 05/17/13 1112      Chief Complaint  Patient presents with  . Wheezing    (Consider location/radiation/quality/duration/timing/severity/associated sxs/prior treatment) HPI Comments: Patient presents to the ER for evaluation of wheezing, cough and shortness of breath. Patient has had these symptoms for several days. She saw her family doctor 3 days ago and was started on Zithromax. She started to improve today, then the symptoms started all over again. She has not had any fever. She has been using albuterol at home without improvement. She has a nebulizer this morning before coming to the ER and then was given an additional nebulizer arrival to the ER. She reports that her wheezing has improved after these treatments today.  She has been experiencing nasal congestion, difficulty breathing through her nose. This has caused her to feel short of breath.  Patient is a 79 y.o. female presenting with wheezing.  Wheezing Associated symptoms: cough     Past Medical History  Diagnosis Date  . Hypertension   . Bronchitis   . Leg pain   . Stomach tumor (benign)     Past Surgical History  Procedure Laterality Date  . Stomach tumor removal    . Nasal sinus surgery      No family history on file.  History  Substance Use Topics  . Smoking status: Never Smoker   . Smokeless tobacco: Not on file  . Alcohol Use: No    OB History   Grav Para Term Preterm Abortions TAB SAB Ect Mult Living                  Review of Systems  HENT: Positive for congestion.   Respiratory: Positive for cough and wheezing.   All other systems reviewed and are negative.    Allergies  Codeine  Home Medications   Current Outpatient Rx  Name  Route  Sig  Dispense  Refill  . amLODipine (NORVASC) 5 MG tablet   Oral   Take 5 mg by mouth daily.         Marland Kitchen aspirin 81 MG chewable tablet    Oral   Chew 81 mg by mouth daily.         Marland Kitchen atorvastatin (LIPITOR) 20 MG tablet   Oral   Take 20 mg by mouth daily.         . carvedilol (COREG) 25 MG tablet   Oral   Take 25 mg by mouth 2 (two) times daily with a meal.         . Cyanocobalamin (VITAMIN B-12 PO)   Oral   Take 1 tablet by mouth daily.         Marland Kitchen doxazosin (CARDURA) 4 MG tablet   Oral   Take 4 mg by mouth at bedtime.         . gabapentin (NEURONTIN) 300 MG capsule   Oral   Take 300 mg by mouth 2 (two) times daily.         Marland Kitchen levothyroxine (SYNTHROID, LEVOTHROID) 50 MCG tablet   Oral   Take 50 mcg by mouth daily.         . ramipril (ALTACE) 10 MG capsule   Oral   Take 10 mg by mouth 2 (two) times daily.           BP 128/78  Pulse 77  Temp(Src) 98.3 F (36.8  C) (Oral)  Resp 22  SpO2 99%  Physical Exam  Constitutional: She is oriented to person, place, and time. She appears well-developed and well-nourished. No distress.  HENT:  Head: Normocephalic and atraumatic.  Right Ear: Hearing normal.  Left Ear: Hearing normal.  Nose: Nose normal.  Mouth/Throat: Oropharynx is clear and moist and mucous membranes are normal.  Eyes: Conjunctivae and EOM are normal. Pupils are equal, round, and reactive to light.  Neck: Normal range of motion. Neck supple.  Cardiovascular: Regular rhythm, S1 normal and S2 normal.  Exam reveals no gallop and no friction rub.   No murmur heard. Pulmonary/Chest: Effort normal. No respiratory distress. She has wheezes. She exhibits no tenderness.  Faint bilateral expiratory wheezing  Abdominal: Soft. Normal appearance and bowel sounds are normal. There is no hepatosplenomegaly. There is no tenderness. There is no rebound, no guarding, no tenderness at McBurney's point and negative Murphy's sign. No hernia.  Musculoskeletal: Normal range of motion.  Neurological: She is alert and oriented to person, place, and time. She has normal strength. No cranial nerve deficit or  sensory deficit. Coordination normal. GCS eye subscore is 4. GCS verbal subscore is 5. GCS motor subscore is 6.  Skin: Skin is warm, dry and intact. No rash noted. No cyanosis.  Psychiatric: She has a normal mood and affect. Her speech is normal and behavior is normal. Thought content normal.    ED Course  Procedures (including critical care time)  Labs Reviewed - No data to display Dg Chest 2 View (if Patient Has Fever And/or Copd)  05/17/2013   *RADIOLOGY REPORT*  Clinical Data: Wheezing, cough  CHEST - 2 VIEW  Comparison:  12/19/2012  Findings: Cardiomediastinal silhouette is stable.  No pulmonary edema.  There is left basilar atelectasis or infiltrate.  Stable degenerative changes thoracic spine.  IMPRESSION: No pulmonary edema.  Left basilar atelectasis or infiltrate.   Original Report Authenticated By: Lahoma Crocker, M.D.     Diagnosis: Pneumonia versus bronchitis    MDM  Patient presents to ER with respiratory symptoms for several days. She was placed on Zithromax 3 days ago. Patient has persistent symptoms. After 2 nebulizer treatments today, which has almost resolved. An additional treatment given here in the ER before discharge. X-ray shows either atelectasis or infiltrate in the left base on the lateral view. Zithromax likely will cover this if it is pneumonia, but will add Omnicef for broad-spectrum coverage. Her oxygen saturations are 95% on room air, she does not require admission. Will be given prednisone 40 mg daily for 5 days in addition to the antibiotic coverage and continue bronchodilators. Follow up with Doctor [name] this week.        Orpah Greek, MD 05/17/13 1159

## 2013-05-17 NOTE — ED Notes (Signed)
Pt presents with audible wheezing/ Family reports symptoms onset Wednesday. Pt seen by Dr [name] and given medications.

## 2018-03-17 ENCOUNTER — Other Ambulatory Visit: Payer: Self-pay

## 2018-03-17 ENCOUNTER — Emergency Department (HOSPITAL_COMMUNITY): Payer: Medicare Other

## 2018-03-17 ENCOUNTER — Encounter (HOSPITAL_COMMUNITY): Payer: Self-pay | Admitting: Emergency Medicine

## 2018-03-17 ENCOUNTER — Emergency Department (HOSPITAL_COMMUNITY)
Admission: EM | Admit: 2018-03-17 | Discharge: 2018-03-17 | Disposition: A | Discharge status: Home / Self Care | Payer: Medicare Other | Attending: Emergency Medicine | Admitting: Emergency Medicine

## 2018-03-17 DIAGNOSIS — R0602 Shortness of breath: Secondary | ICD-10-CM | POA: Diagnosis present

## 2018-03-17 DIAGNOSIS — J302 Other seasonal allergic rhinitis: Secondary | ICD-10-CM | POA: Diagnosis not present

## 2018-03-17 DIAGNOSIS — Z79899 Other long term (current) drug therapy: Secondary | ICD-10-CM | POA: Diagnosis not present

## 2018-03-17 DIAGNOSIS — Z7982 Long term (current) use of aspirin: Secondary | ICD-10-CM | POA: Diagnosis not present

## 2018-03-17 DIAGNOSIS — J45901 Unspecified asthma with (acute) exacerbation: Secondary | ICD-10-CM | POA: Insufficient documentation

## 2018-03-17 DIAGNOSIS — M2550 Pain in unspecified joint: Secondary | ICD-10-CM | POA: Diagnosis not present

## 2018-03-17 DIAGNOSIS — I10 Essential (primary) hypertension: Secondary | ICD-10-CM | POA: Diagnosis not present

## 2018-03-17 LAB — CBC
HEMATOCRIT: 33.8 % — AB (ref 36.0–46.0)
Hemoglobin: 11.4 g/dL — ABNORMAL LOW (ref 12.0–15.0)
MCH: 29.5 pg (ref 26.0–34.0)
MCHC: 33.7 g/dL (ref 30.0–36.0)
MCV: 87.3 fL (ref 78.0–100.0)
Platelets: 214 10*3/uL (ref 150–400)
RBC: 3.87 MIL/uL (ref 3.87–5.11)
RDW: 14.6 % (ref 11.5–15.5)
WBC: 4.9 10*3/uL (ref 4.0–10.5)

## 2018-03-17 LAB — I-STAT TROPONIN, ED: TROPONIN I, POC: 0.01 ng/mL (ref 0.00–0.08)

## 2018-03-17 LAB — BASIC METABOLIC PANEL
Anion gap: 11 (ref 5–15)
BUN: 8 mg/dL (ref 6–20)
CALCIUM: 9.4 mg/dL (ref 8.9–10.3)
CO2: 20 mmol/L — ABNORMAL LOW (ref 22–32)
CREATININE: 0.92 mg/dL (ref 0.44–1.00)
Chloride: 106 mmol/L (ref 101–111)
GFR calc Af Amer: >60 mL/min (ref >60)
GFR, EST NON AFRICAN AMERICAN: 54 mL/min — AB (ref >60)
Glucose, Bld: 111 mg/dL — ABNORMAL HIGH (ref 65–99)
POTASSIUM: 3.6 mmol/L (ref 3.5–5.1)
SODIUM: 137 mmol/L (ref 135–145)

## 2018-03-17 MED ORDER — PREDNISONE 20 MG PO TABS
60.0000 mg | ORAL_TABLET | Freq: Once | ORAL | Status: AC
Start: 2018-03-17 — End: 2018-03-17
  Administered 2018-03-17: 60 mg via ORAL
  Filled 2018-03-17: qty 3

## 2018-03-17 MED ORDER — PREDNISONE 20 MG PO TABS: 20.0000 mg | ORAL_TABLET | Freq: Two times a day (BID) | ORAL | 0 refills | Status: DC

## 2018-03-17 MED ORDER — ALBUTEROL SULFATE (2.5 MG/3ML) 0.083% IN NEBU
5.0000 mg | INHALATION_SOLUTION | Freq: Once | RESPIRATORY_TRACT | Status: AC
  Administered 2018-03-17: 5 mg via RESPIRATORY_TRACT
  Filled 2018-03-17: qty 6

## 2018-03-17 MED ORDER — TRAMADOL HCL 50 MG PO TABS: 50.0000 mg | ORAL_TABLET | Freq: Four times a day (QID) | ORAL | 0 refills | Status: DC | PRN

## 2018-03-17 MED ORDER — ACETAMINOPHEN 325 MG PO TABS
650.0000 mg | ORAL_TABLET | Freq: Once | ORAL | Status: AC
  Administered 2018-03-17: 650 mg via ORAL
  Filled 2018-03-17: qty 2

## 2018-03-17 NOTE — ED Notes (Signed)
Pt understood dc material. NAD noted. Scripts given dc

## 2018-03-17 NOTE — ED Notes (Signed)
Updated on wait for treatment room.  Warm blankets given.

## 2018-03-17 NOTE — Discharge Instructions (Addendum)
Your symptoms are likely from bronchitis.  We are prescribing prednisone to help with that.  Continue using your nebulizer for cough or trouble breathing.  To help your joint pain we are prescribing tramadol, but the best medicine for joint pain is usually Tylenol.  Also try using heat on your sore joints to help with the discomfort.

## 2018-03-17 NOTE — ED Triage Notes (Signed)
Patient presents to ED for assessment of shortness of breath, cough, congestion, and back and shoulder pain ("I've got arthritis") x 2 weeks, but worse last night.  Patient states she did not get any sleep from coughing last night.  Also states some mid sternal chest pain, intermittent, comes with coughing.

## 2018-03-17 NOTE — ED Provider Notes (Signed)
[facility] EM[facility] DEPARTMENT Provider Note   CSN: [id number] Arrival date & time: 03/17/18  1445     History   Chief Complaint Chief Complaint  Patient presents with  . Shortness of Breath  . Neck Pain    HPI Monique Lin is a 84 y.o. female.  She presents for evaluation of shortness of breath, cough and trouble breathing.  She states that she "always has bronchitis and sinusitis," and has been more troubled by in the last 2 days.  She denies nausea, vomiting, fever, chills, head, neck, chest or back pain.  She has been using her nebulizer at home.  She has been taking her usual medications.  No other recent illnesses and no recent evaluations by her PCP.  There are no other known modifying factors.  HPI  Past Medical History:  Diagnosis Date  . Bronchitis   . Hypertension   . Leg pain   . Stomach tumor (benign)     Patient Active Problem List   Diagnosis Date Noted  . Bronchitis     Past Surgical History:  Procedure Laterality Date  . NASAL SINUS SURGERY    . stomach tumor removal       OB History   None      Home Medications    Prior to Admission medications   Medication Sig Start Date End Date Taking? Authorizing Provider  albuterol (PROVENTIL HFA;VENTOLIN HFA) 108 (90 BASE) MCG/ACT inhaler Inhale 2 puffs into the lungs every 6 (six) hours as needed for wheezing.   Yes [provider]  albuterol (PROVENTIL) (2.5 MG/3ML) 0.083% nebulizer solution Take 2.5 mg by nebulization every 6 (six) hours as needed for wheezing.   Yes [provider]  amLODipine (NORVASC) 5 MG tablet Take 5 mg by mouth daily.   Yes [provider]  aspirin EC 81 MG tablet Take 81 mg by mouth daily.   Yes [provider]  atorvastatin (LIPITOR) 20 MG tablet Take 20 mg by mouth daily.   Yes [provider]  carvedilol (COREG) 25 MG tablet Take 25 mg by mouth 2 (two) times daily with a meal.   Yes [provider]    Cyanocobalamin (VITAMIN B-12 PO) Take 500 mg by mouth daily.    Yes [provider]  doxazosin (CARDURA) 4 MG tablet Take 4 mg by mouth daily.    Yes [provider]  fluticasone (FLONASE) 50 MCG/ACT nasal spray Place 2 sprays into the nose daily.   Yes [provider]  FOLIC ACID PO Take 884 mg by mouth daily.   Yes [provider]  gabapentin (NEURONTIN) 300 MG capsule Take 300 mg by mouth 3 (three) times daily.    Yes [provider]  levothyroxine (SYNTHROID, LEVOTHROID) 50 MCG tablet Take 50 mcg by mouth daily.   Yes [provider]  Liniments (SALONPAS PAIN RELIEF PATCH EX) Apply 1 patch topically daily as needed (pain).   Yes [provider]  losartan-hydrochlorothiazide (HYZAAR) 50-12.5 MG per tablet Take 1 tablet by mouth daily.   Yes [provider]  trolamine salicylate (ASPERCREME) 10 % cream Apply 1 application topically as needed (pain).   Yes [provider]  aspirin 81 MG chewable tablet Chew 81 mg by mouth daily.    [provider]  cefdinir (OMNICEF) 300 MG capsule Take 1 capsule (300 mg total) by mouth 2 (two) times daily. Patient not taking: Reported on 03/17/2018 05/17/13   Orpah Greek,  MD  predniSONE (DELTASONE) 20 MG tablet Take 1 tablet (20 mg total) by mouth 2 (two) times daily. 03/17/18   Daleen Bo, MD  traMADol (ULTRAM) 50 MG tablet Take 1 tablet (50 mg total) by mouth every 6 (six) hours as needed. 03/17/18   Daleen Bo, MD    Family History History reviewed. No pertinent family history.  Social History Social History   Tobacco Use  . Smoking status: Never Smoker  . Smokeless tobacco: Never Used  Substance Use Topics  . Alcohol use: No  . Drug use: No     Allergies   Codeine   Review of Systems Review of Systems  All other systems reviewed and are negative.    Physical Exam Updated Vital Signs BP (!) 175/88   Pulse 99   Temp 97.9 F (36.6  C) (Oral)   Resp (!) 23   SpO2 97%   Physical Exam  Constitutional: She is oriented to person, place, and time. She appears well-developed. She does not appear ill.  Elderly, frail  HENT:  Head: Normocephalic and atraumatic.  Right Ear: External ear normal.  Left Ear: External ear normal.  Eyes: Pupils are equal, round, and reactive to light. Conjunctivae and EOM are normal.  Neck: Normal range of motion and phonation normal. Neck supple.  Cardiovascular: Normal rate, regular rhythm and normal heart sounds.  Pulmonary/Chest: Effort normal. She exhibits no bony tenderness.  Somewhat decreased air movement bilaterally with scattered wheezes.  No rhonchi or rales.  Abdominal: Soft. There is no tenderness.  Musculoskeletal: Normal range of motion. She exhibits no edema or deformity.  Neurological: She is alert and oriented to person, place, and time. No cranial nerve deficit or sensory deficit. She exhibits normal muscle tone. Coordination normal.  Skin: Skin is warm, dry and intact.  Psychiatric: She has a normal mood and affect. Her behavior is normal. Judgment and thought content normal.  Nursing note and vitals reviewed.    ED Treatments / Results  Labs (all labs ordered are listed, but only abnormal results are displayed) Labs Reviewed  BASIC METABOLIC PANEL - Abnormal; Notable for the following components:      Result Value   CO2 20 (*)    Glucose, Bld 111 (*)    GFR calc non Af Amer 54 (*)    All other components within normal limits  CBC - Abnormal; Notable for the following components:   Hemoglobin 11.4 (*)    HCT 33.8 (*)    All other components within normal limits  I-STAT TROPONIN, ED    EKG EKG Interpretation  Date/Time:  Sunday March 17 2018 15:39:23 EDT Ventricular Rate:  96 PR Interval:  156 QRS Duration: 70 QT Interval:  342 QTC Calculation: 432 R Axis:   88 Text Interpretation:  Normal sinus rhythm Cannot rule out Anterior infarct , age undetermined  Abnormal ECG since last tracing no significant change Confirmed by Daleen Bo [phone]) on 03/17/2018 6:47:58 PM   Radiology Dg Chest 2 View  Result Date: 03/17/2018 CLINICAL DATA:  Shortness of breath with cough and congestion EXAM: CHEST - 2 VIEW COMPARISON:  05/17/2013 FINDINGS: Normal heart size and mediastinal contours. Streaky opacity in the retrocardiac and right infrahilar lung that was also seen previously. There is airway thickening and lower lung scarring on a 2011 abdominal CT. No acute osseous finding. IMPRESSION: 1. No acute finding when compared to prior. 2. Chronic lower lobe scarring seen since at least 2011 abdominal CT. Electronically Signed  By: Monte Fantasia M.D.   On: 03/17/2018 16:00    Procedures Procedures (including critical care time)  Medications Ordered in ED Medications  predniSONE (DELTASONE) tablet 60 mg (60 mg Oral Given 03/17/18 1912)  albuterol (PROVENTIL) (2.5 MG/3ML) 0.083% nebulizer solution 5 mg (5 mg Nebulization Given 03/17/18 1913)     Initial Impression / Assessment and Plan / ED Course  I have reviewed the triage vital signs and the nursing notes.  Pertinent labs & imaging results that were available during my care of the patient were reviewed by me and considered in my medical decision making (see chart for details).      Patient Vitals for the past 24 hrs:  BP Temp Temp src Pulse Resp SpO2  03/17/18 2030 (!) 175/88 - - 99 (!) 23 97 %  03/17/18 1900 (!) 198/96 - - (!) 101 16 97 %  03/17/18 1830 (!) 198/90 - - 99 14 99 %  03/17/18 1811 - 97.9 F (36.6 C) Oral - - -  03/17/18 1800 (!) 194/97 - - (!) 105 12 99 %  03/17/18 1754 (!) 192/84 - - (!) 102 - 99 %  03/17/18 1527 (!) 175/81 98.4 F (36.9 C) Oral 98 18 100 %    8:56 PM Reevaluation with update and discussion. After initial assessment and treatment, an updated evaluation reveals at this time the patient has improved air movement on auscultation.  The patient's daughter states  that the patient has diffuse arthralgia and wants her to have a narcotic pain reliever.  We discussed possibilities of treatment and agreed on tramadol.  All questions answered. [place] decision making-evaluation consistent with bronchitis, nonspecific but possibly related to seasonal allergy.  Patient plans on taking Allegra, for seasonal allergy.  She is to use her nebulizer at home, and given a prescription for prednisone.  Nonspecific arthralgias will be treated symptomatically.  Nursing Notes Reviewed/ Care Coordinated Applicable Imaging Reviewed Interpretation of Laboratory Data incorporated into ED treatment  The patient appears reasonably screened and/or stabilized for discharge and I doubt any other medical condition or other [facility] requiring further screening, evaluation, or treatment in the ED at this time prior to discharge.  Plan: Home Medications-continue usual medications, use Allegra for antihistamine; Home Treatments-rest, fluids; return here if the recommended treatment, does not improve the symptoms; Recommended follow up-PCP checkup 1 week and as needed   Final Clinical Impressions(s) / ED Diagnoses   Final diagnoses:  Moderate asthma with exacerbation, unspecified whether persistent  Seasonal allergies  Arthralgia, unspecified joint    ED Discharge Orders        Ordered    predniSONE (DELTASONE) 20 MG tablet  2 times daily     03/17/18 2054    traMADol (ULTRAM) 50 MG tablet  Every 6 hours PRN     03/17/18 2054       Daleen Bo, MD 03/17/18 2057

## 2018-04-01 ENCOUNTER — Inpatient Hospital Stay (HOSPITAL_COMMUNITY): Payer: Medicare Other

## 2018-04-01 ENCOUNTER — Inpatient Hospital Stay (HOSPITAL_COMMUNITY)
Admission: EM | Admit: 2018-04-01 | Discharge: 2018-04-25 | DRG: 871 | Disposition: A | Discharge status: Skilled Nursing Facility | Payer: Medicare Other | Attending: Cardiology | Admitting: Cardiology

## 2018-04-01 ENCOUNTER — Other Ambulatory Visit: Payer: Self-pay

## 2018-04-01 ENCOUNTER — Emergency Department (HOSPITAL_COMMUNITY): Payer: Medicare Other

## 2018-04-01 DIAGNOSIS — Z6835 Body mass index (BMI) 35.0-35.9, adult: Secondary | ICD-10-CM | POA: Diagnosis not present

## 2018-04-01 DIAGNOSIS — R6520 Severe sepsis without septic shock: Secondary | ICD-10-CM

## 2018-04-01 DIAGNOSIS — J90 Pleural effusion, not elsewhere classified: Secondary | ICD-10-CM

## 2018-04-01 DIAGNOSIS — I10 Essential (primary) hypertension: Secondary | ICD-10-CM | POA: Diagnosis present

## 2018-04-01 DIAGNOSIS — E876 Hypokalemia: Secondary | ICD-10-CM | POA: Diagnosis not present

## 2018-04-01 DIAGNOSIS — J189 Pneumonia, unspecified organism: Secondary | ICD-10-CM

## 2018-04-01 DIAGNOSIS — J45998 Other asthma: Secondary | ICD-10-CM | POA: Diagnosis present

## 2018-04-01 DIAGNOSIS — R0902 Hypoxemia: Secondary | ICD-10-CM | POA: Diagnosis present

## 2018-04-01 DIAGNOSIS — E039 Hypothyroidism, unspecified: Secondary | ICD-10-CM | POA: Diagnosis present

## 2018-04-01 DIAGNOSIS — E872 Acidosis: Secondary | ICD-10-CM | POA: Diagnosis present

## 2018-04-01 DIAGNOSIS — G9340 Encephalopathy, unspecified: Secondary | ICD-10-CM | POA: Diagnosis not present

## 2018-04-01 DIAGNOSIS — J9809 Other diseases of bronchus, not elsewhere classified: Secondary | ICD-10-CM | POA: Diagnosis present

## 2018-04-01 DIAGNOSIS — I959 Hypotension, unspecified: Secondary | ICD-10-CM | POA: Diagnosis present

## 2018-04-01 DIAGNOSIS — N179 Acute kidney failure, unspecified: Secondary | ICD-10-CM | POA: Diagnosis present

## 2018-04-01 DIAGNOSIS — R6521 Severe sepsis with septic shock: Secondary | ICD-10-CM | POA: Diagnosis present

## 2018-04-01 DIAGNOSIS — J869 Pyothorax without fistula: Secondary | ICD-10-CM | POA: Diagnosis present

## 2018-04-01 DIAGNOSIS — Z4682 Encounter for fitting and adjustment of non-vascular catheter: Secondary | ICD-10-CM

## 2018-04-01 DIAGNOSIS — J9601 Acute respiratory failure with hypoxia: Secondary | ICD-10-CM

## 2018-04-01 DIAGNOSIS — R0600 Dyspnea, unspecified: Secondary | ICD-10-CM | POA: Diagnosis not present

## 2018-04-01 DIAGNOSIS — Z9889 Other specified postprocedural states: Secondary | ICD-10-CM

## 2018-04-01 DIAGNOSIS — J42 Unspecified chronic bronchitis: Secondary | ICD-10-CM | POA: Diagnosis present

## 2018-04-01 DIAGNOSIS — R0603 Acute respiratory distress: Secondary | ICD-10-CM | POA: Diagnosis present

## 2018-04-01 DIAGNOSIS — J9690 Respiratory failure, unspecified, unspecified whether with hypoxia or hypercapnia: Secondary | ICD-10-CM

## 2018-04-01 DIAGNOSIS — J9811 Atelectasis: Secondary | ICD-10-CM | POA: Diagnosis present

## 2018-04-01 DIAGNOSIS — Z7982 Long term (current) use of aspirin: Secondary | ICD-10-CM

## 2018-04-01 DIAGNOSIS — R509 Fever, unspecified: Secondary | ICD-10-CM

## 2018-04-01 DIAGNOSIS — I2510 Atherosclerotic heart disease of native coronary artery without angina pectoris: Secondary | ICD-10-CM | POA: Diagnosis present

## 2018-04-01 DIAGNOSIS — A419 Sepsis, unspecified organism: Principal | ICD-10-CM

## 2018-04-01 DIAGNOSIS — Z9689 Presence of other specified functional implants: Secondary | ICD-10-CM

## 2018-04-01 DIAGNOSIS — J181 Lobar pneumonia, unspecified organism: Secondary | ICD-10-CM | POA: Diagnosis present

## 2018-04-01 DIAGNOSIS — J918 Pleural effusion in other conditions classified elsewhere: Secondary | ICD-10-CM | POA: Diagnosis present

## 2018-04-01 DIAGNOSIS — I472 Ventricular tachycardia: Secondary | ICD-10-CM | POA: Diagnosis present

## 2018-04-01 DIAGNOSIS — E785 Hyperlipidemia, unspecified: Secondary | ICD-10-CM | POA: Diagnosis present

## 2018-04-01 DIAGNOSIS — R54 Age-related physical debility: Secondary | ICD-10-CM | POA: Diagnosis present

## 2018-04-01 DIAGNOSIS — D638 Anemia in other chronic diseases classified elsewhere: Secondary | ICD-10-CM | POA: Diagnosis present

## 2018-04-01 LAB — DIFFERENTIAL
BASOS ABS: 0 10*3/uL (ref 0.0–0.1)
Basophils Relative: 0 %
Eosinophils Absolute: 0 10*3/uL (ref 0.0–0.7)
Eosinophils Relative: 0 %
LYMPHS ABS: 1.6 10*3/uL (ref 0.7–4.0)
Lymphocytes Relative: 6 %
MONO ABS: 1.8 10*3/uL — AB (ref 0.1–1.0)
Monocytes Relative: 7 %
NEUTROS PCT: 87 %
Neutro Abs: 22.5 10*3/uL — ABNORMAL HIGH (ref 1.7–7.7)

## 2018-04-01 LAB — URINALYSIS, ROUTINE W REFLEX MICROSCOPIC
GLUCOSE, UA: NEGATIVE mg/dL
Hgb urine dipstick: NEGATIVE
Ketones, ur: NEGATIVE mg/dL
LEUKOCYTES UA: NEGATIVE
Nitrite: NEGATIVE
Protein, ur: 30 mg/dL — AB
Specific Gravity, Urine: 1.02 (ref 1.005–1.030)
pH: 5 (ref 5.0–8.0)

## 2018-04-01 LAB — COMPREHENSIVE METABOLIC PANEL
ALBUMIN: 2.3 g/dL — AB (ref 3.5–5.0)
ALT: 16 U/L (ref 14–54)
ANION GAP: 14 (ref 5–15)
AST: 29 U/L (ref 15–41)
Alkaline Phosphatase: 106 U/L (ref 38–126)
BUN: 47 mg/dL — ABNORMAL HIGH (ref 6–20)
CHLORIDE: 100 mmol/L — AB (ref 101–111)
CO2: 20 mmol/L — AB (ref 22–32)
Calcium: 9 mg/dL (ref 8.9–10.3)
Creatinine, Ser: 3.59 mg/dL — ABNORMAL HIGH (ref 0.44–1.00)
GFR calc Af Amer: 12 mL/min — ABNORMAL LOW (ref >60)
GFR calc non Af Amer: 10 mL/min — ABNORMAL LOW (ref >60)
GLUCOSE: 150 mg/dL — AB (ref 65–99)
POTASSIUM: 4.3 mmol/L (ref 3.5–5.1)
SODIUM: 134 mmol/L — AB (ref 135–145)
Total Bilirubin: 1.5 mg/dL — ABNORMAL HIGH (ref 0.3–1.2)
Total Protein: 6.9 g/dL (ref 6.5–8.1)

## 2018-04-01 LAB — I-STAT CHEM 8, ED
BUN: 40 mg/dL — ABNORMAL HIGH (ref 6–20)
CREATININE: 3.5 mg/dL — AB (ref 0.44–1.00)
Calcium, Ion: 1.12 mmol/L — ABNORMAL LOW (ref 1.15–1.40)
Chloride: 104 mmol/L (ref 101–111)
Glucose, Bld: 147 mg/dL — ABNORMAL HIGH (ref 65–99)
HEMATOCRIT: 31 % — AB (ref 36.0–46.0)
HEMOGLOBIN: 10.5 g/dL — AB (ref 12.0–15.0)
POTASSIUM: 4.3 mmol/L (ref 3.5–5.1)
Sodium: 136 mmol/L (ref 135–145)
TCO2: 22 mmol/L (ref 22–32)

## 2018-04-01 LAB — RAPID URINE DRUG SCREEN, HOSP PERFORMED
AMPHETAMINES: NOT DETECTED
BENZODIAZEPINES: NOT DETECTED
Barbiturates: NOT DETECTED
COCAINE: NOT DETECTED
OPIATES: NOT DETECTED
TETRAHYDROCANNABINOL: NOT DETECTED

## 2018-04-01 LAB — CBC
HCT: 29.4 % — ABNORMAL LOW (ref 36.0–46.0)
HEMOGLOBIN: 9.8 g/dL — AB (ref 12.0–15.0)
MCH: 30 pg (ref 26.0–34.0)
MCHC: 33.3 g/dL (ref 30.0–36.0)
MCV: 89.9 fL (ref 78.0–100.0)
Platelets: 268 10*3/uL (ref 150–400)
RBC: 3.27 MIL/uL — ABNORMAL LOW (ref 3.87–5.11)
RDW: 14.4 % (ref 11.5–15.5)
WBC: 25.9 10*3/uL — AB (ref 4.0–10.5)

## 2018-04-01 LAB — CBG MONITORING, ED: Glucose-Capillary: 131 mg/dL — ABNORMAL HIGH (ref 65–99)

## 2018-04-01 LAB — PROCALCITONIN: Procalcitonin: 2.63 ng/mL

## 2018-04-01 LAB — I-STAT TROPONIN, ED: Troponin i, poc: 0 ng/mL (ref 0.00–0.08)

## 2018-04-01 LAB — I-STAT CG4 LACTIC ACID, ED: Lactic Acid, Venous: 1.97 mmol/L — ABNORMAL HIGH (ref 0.5–1.9)

## 2018-04-01 LAB — APTT: APTT: 39 s — AB (ref 24–36)

## 2018-04-01 LAB — ETHANOL

## 2018-04-01 LAB — PROTIME-INR
INR: 1.36
Prothrombin Time: 16.6 seconds — ABNORMAL HIGH (ref 11.4–15.2)

## 2018-04-01 MED ORDER — SODIUM CHLORIDE 0.9 % IV BOLUS
2000.0000 mL | Freq: Once | INTRAVENOUS | Status: AC
  Administered 2018-04-01: 2000 mL via INTRAVENOUS

## 2018-04-01 MED ORDER — SODIUM CHLORIDE 0.9 % IV SOLN
INTRAVENOUS | Status: DC
  Administered 2018-04-02 – 2018-04-08 (×7): via INTRAVENOUS

## 2018-04-01 MED ORDER — ASPIRIN 81 MG PO CHEW
81.0000 mg | CHEWABLE_TABLET | Freq: Every day | ORAL | Status: DC
  Administered 2018-04-02 – 2018-04-25 (×22): 81 mg via ORAL
  Filled 2018-04-01 (×22): qty 1

## 2018-04-01 MED ORDER — LEVOTHYROXINE SODIUM 50 MCG PO TABS
50.0000 ug | ORAL_TABLET | Freq: Every day | ORAL | Status: DC
  Administered 2018-04-03 – 2018-04-25 (×20): 50 ug via ORAL
  Filled 2018-04-01 (×22): qty 1

## 2018-04-01 MED ORDER — ALBUTEROL (5 MG/ML) CONTINUOUS INHALATION SOLN
10.0000 mg/h | INHALATION_SOLUTION | Freq: Once | RESPIRATORY_TRACT | Status: AC
  Administered 2018-04-01: 10 mg/h via RESPIRATORY_TRACT
  Filled 2018-04-01: qty 20

## 2018-04-01 MED ORDER — LEVALBUTEROL HCL 1.25 MG/0.5ML IN NEBU
1.2500 mg | INHALATION_SOLUTION | Freq: Three times a day (TID) | RESPIRATORY_TRACT | Status: DC
  Administered 2018-04-02 – 2018-04-08 (×20): 1.25 mg via RESPIRATORY_TRACT
  Filled 2018-04-01 (×20): qty 0.5

## 2018-04-01 MED ORDER — SODIUM CHLORIDE 0.9 % IV SOLN
500.0000 mg | INTRAVENOUS | Status: DC
  Administered 2018-04-01: 500 mg via INTRAVENOUS
  Filled 2018-04-01: qty 500

## 2018-04-01 MED ORDER — PANTOPRAZOLE SODIUM 40 MG PO TBEC: 40.0000 mg | DELAYED_RELEASE_TABLET | Freq: Every day | ORAL | Status: DC

## 2018-04-01 MED ORDER — SODIUM CHLORIDE 0.9 % IV SOLN
2.0000 g | INTRAVENOUS | Status: DC
  Administered 2018-04-01: 2 g via INTRAVENOUS
  Filled 2018-04-01: qty 20

## 2018-04-01 MED ORDER — ENOXAPARIN SODIUM 30 MG/0.3ML ~~LOC~~ SOLN
30.0000 mg | SUBCUTANEOUS | Status: DC
  Administered 2018-04-02 – 2018-04-03 (×2): 30 mg via SUBCUTANEOUS
  Filled 2018-04-01 (×5): qty 0.3

## 2018-04-01 NOTE — ED Triage Notes (Signed)
Pt's daughter st's pt has increased weakness over past week.  Today they noticed pt leaning to left.  Pt also has had a congested cough.  Grips = bil and strong.  Dr. Sabra Heck to triage to see pt.

## 2018-04-01 NOTE — H&P (Signed)
Monique Lin is an 84 y.o. female.   Chief Complaint: cough associated with shortness of breath and generalized weakness for one week IDP:OEUMPNT is 84 year old female with past medical history significant for hypertension, hypothyroidism, hyperlipidemia, history of bronchial asthma, morbid obesity, history of GI stromal tumor in the past history of upper GI bleed in the past, came to the ER complaining of cough associated with left sided chest pain or difficulty breathing and generalized weakness and poor appetite for last 7 days. Patient denies any fever but states has been feeling cold workup in the ED showed patient was noted to be hypotensive and hypoxic x-ray showed large left-sided pleural effusion with white count of 26,000 patient received 2 L of IV fluids and IV Rocephin and Zithromax with improvement in her blood pressure and her oxygenation. Patient also was noted to have acute renal failure with creatinine of 3.59. Patient was seen approximately 2 weeks ago in ED and was noted to have abnormal x-ray and creatinine and was treated for bronchitis.  Past Medical History:  Diagnosis Date  . Bronchitis   . Hypertension   . Leg pain   . Stomach tumor (benign)     Past Surgical History:  Procedure Laterality Date  . NASAL SINUS SURGERY    . stomach tumor removal      No family history on file. Social History:  reports that she has never smoked. She has never used smokeless tobacco. She reports that she does not drink alcohol or use drugs.  Allergies:  Allergies  Allergen Reactions  . Codeine Other (See Comments)    Stomach cramps  . Penicillins Other (See Comments)    "Bad stomach cramps" Has patient had a PCN reaction causing immediate rash, facial/tongue/throat swelling, SOB or lightheadedness with hypotension: Unk Has patient had a PCN reaction causing severe rash involving mucus membranes or skin necrosis: Unk Has patient had a PCN reaction that required hospitalization:  Unk Has patient had a PCN reaction occurring within the last 10 years: No If all of the above answers are "NO", then may proceed with Cephalosporin use.       (Not in a hospital admission)  Results for orders placed or performed during the hospital encounter of 04/01/18 (from the past 48 hour(s))  CBG monitoring, ED     Status: Abnormal   Collection Time: 04/01/18  7:04 PM  Result Value Ref Range   Glucose-Capillary 131 (H) 65 - 99 mg/dL  Ethanol     Status: None   Collection Time: 04/01/18  7:28 PM  Result Value Ref Range   Alcohol, Ethyl (B) <10 <10 mg/dL    Comment:        LOWEST DETECTABLE LIMIT FOR SERUM ALCOHOL IS 10 mg/dL FOR MEDICAL PURPOSES ONLY Performed at [facility], [place] [address], [place], [place] [address]   Protime-INR     Status: Abnormal   Collection Time: 04/01/18  7:28 PM  Result Value Ref Range   Prothrombin Time 16.6 (H) 11.4 - 15.2 seconds   INR 1.36     Comment: Performed at [facility] [address], [place], [place] [address]  APTT     Status: Abnormal   Collection Time: 04/01/18  7:28 PM  Result Value Ref Range   aPTT 39 (H) 24 - 36 seconds    Comment:        IF BASELINE aPTT IS ELEVATED, SUGGEST PATIENT RISK ASSESSMENT BE USED TO DETERMINE APPROPRIATE ANTICOAGULANT THERAPY. Performed  at [facility], [place] [address] [place], [place] [address]   CBC     Status: Abnormal   Collection Time: 04/01/18  7:28 PM  Result Value Ref Range   WBC 25.9 (H) 4.0 - 10.5 K/uL    Comment: REPEATED TO VERIFY   RBC 3.27 (L) 3.87 - 5.11 MIL/uL   Hemoglobin 9.8 (L) 12.0 - 15.0 g/dL   HCT 29.4 (L) 36.0 - 46.0 %   MCV 89.9 78.0 - 100.0 fL   MCH 30.0 26.0 - 34.0 pg   MCHC 33.3 30.0 - 36.0 g/dL   RDW 14.4 11.5 - 15.5 %   Platelets 268 150 - 400 K/uL    Comment: Performed at [facility], [place] [address], [place], [place] [address]  Differential     Status: Abnormal   Collection Time: 04/01/18  7:28 PM  Result Value Ref Range    Neutrophils Relative % 87 %   Lymphocytes Relative 6 %   Monocytes Relative 7 %   Eosinophils Relative 0 %   Basophils Relative 0 %   Neutro Abs 22.5 (H) 1.7 - 7.7 K/uL   Lymphs Abs 1.6 0.7 - 4.0 K/uL   Monocytes Absolute 1.8 (H) 0.1 - 1.0 K/uL   Eosinophils Absolute 0.0 0.0 - 0.7 K/uL   Basophils Absolute 0.0 0.0 - 0.1 K/uL   WBC Morphology MILD LEFT SHIFT (1-5% METAS, OCC MYELO, OCC BANDS)     Comment: Performed at [facility] [address] [place], [place] [address]  Comprehensive metabolic panel     Status: Abnormal   Collection Time: 04/01/18  7:28 PM  Result Value Ref Range   Sodium 134 (L) 135 - 145 mmol/L   Potassium 4.3 3.5 - 5.1 mmol/L   Chloride 100 (L) 101 - 111 mmol/L   CO2 20 (L) 22 - 32 mmol/L   Glucose, Bld 150 (H) 65 - 99 mg/dL   BUN 47 (H) 6 - 20 mg/dL   Creatinine, Ser 3.59 (H) 0.44 - 1.00 mg/dL   Calcium 9.0 8.9 - 10.3 mg/dL   Total Protein 6.9 6.5 - 8.1 g/dL   Albumin 2.3 (L) 3.5 - 5.0 g/dL   AST 29 15 - 41 U/L   ALT 16 14 - 54 U/L   Alkaline Phosphatase 106 38 - 126 U/L   Total Bilirubin 1.5 (H) 0.3 - 1.2 mg/dL   GFR calc non Af Amer 10 (L) >60 mL/min   GFR calc Af Amer 12 (L) >60 mL/min    Comment: (NOTE) The eGFR has been calculated using the CKD EPI equation. This calculation has not been validated in all clinical situations. eGFR's persistently <60 mL/min signify possible Chronic Kidney Disease.    Anion gap 14 5 - 15    Comment: Performed at [facility] [address], [place], [place] [address]  I-stat troponin, ED     Status: None   Collection Time: 04/01/18  7:35 PM  Result Value Ref Range   Troponin i, poc 0.00 0.00 - 0.08 ng/mL   Comment 3            Comment: Due to the release kinetics of cTnI, a negative result within the first hours of the onset of symptoms does not rule out myocardial infarction with certainty. If myocardial infarction is still suspected, repeat the test at appropriate intervals.   I-Stat Chem 8,  ED     Status: Abnormal   Collection Time: 04/01/18  7:37 PM  Result Value Ref Range   Sodium 136 135 - 145 mmol/L   Potassium 4.3 3.5 - 5.1 mmol/L   Chloride 104 101 - 111 mmol/L   BUN 40 (H) 6 - 20 mg/dL   Creatinine, Ser 3.50 (H) 0.44 - 1.00 mg/dL   Glucose, Bld 147 (H) 65 - 99 mg/dL   Calcium, Ion 1.12 (L) 1.15 - 1.40 mmol/L   TCO2 22 22 - 32 mmol/L   Hemoglobin 10.5 (L) 12.0 - 15.0 g/dL   HCT 31.0 (L) 36.0 - 46.0 %  Urine rapid drug screen (hosp performed)     Status: None   Collection Time: 04/01/18  7:37 PM  Result Value Ref Range   Opiates NONE DETECTED NONE DETECTED   Cocaine NONE DETECTED NONE DETECTED   Benzodiazepines NONE DETECTED NONE DETECTED   Amphetamines NONE DETECTED NONE DETECTED   Tetrahydrocannabinol NONE DETECTED NONE DETECTED   Barbiturates NONE DETECTED NONE DETECTED    Comment: (NOTE) DRUG SCREEN FOR MEDICAL PURPOSES ONLY.  IF CONFIRMATION IS NEEDED FOR ANY PURPOSE, NOTIFY LAB WITHIN 5 DAYS. LOWEST DETECTABLE LIMITS FOR URINE DRUG SCREEN Drug Class                     Cutoff (ng/mL) Amphetamine and metabolites    1000 Barbiturate and metabolites    200 Benzodiazepine                 262 Tricyclics and metabolites     300 Opiates and metabolites        300 Cocaine and metabolites        300 THC                            50 Performed at [facility], [place] [address], [place], [place] [address]   Urinalysis, Routine w reflex microscopic     Status: Abnormal   Collection Time: 04/01/18  7:37 PM  Result Value Ref Range   Color, Urine AMBER (A) YELLOW    Comment: BIOCHEMICALS MAY BE AFFECTED BY COLOR   APPearance CLOUDY (A) CLEAR   Specific Gravity, Urine 1.020 1.005 - 1.030   pH 5.0 5.0 - 8.0   Glucose, UA NEGATIVE NEGATIVE mg/dL   Hgb urine dipstick NEGATIVE NEGATIVE   Bilirubin Urine MODERATE (A) NEGATIVE   Ketones, ur NEGATIVE NEGATIVE mg/dL   Protein, ur 30 (A) NEGATIVE mg/dL   Nitrite NEGATIVE NEGATIVE   Leukocytes, UA NEGATIVE  NEGATIVE   RBC / HPF 0-5 0 - 5 RBC/hpf   WBC, UA 0-5 0 - 5 WBC/hpf   Bacteria, UA RARE (A) NONE SEEN   Squamous Epithelial / LPF 0-5 0 - 5    Comment: Please note change in reference range.   Mucus PRESENT    Hyaline Casts, UA PRESENT    Amorphous Crystal PRESENT     Comment: Performed at [facility], [place] [address], [place], [place] [address]  I-Stat CG4 Lactic Acid, ED  (not at  [facility])     Status: Abnormal   Collection Time: 04/01/18  7:37 PM  Result Value Ref Range   Lactic Acid, Venous 1.97 (H) 0.5 - 1.9 mmol/L   Ct Head Wo Contrast  Result Date: 04/01/2018 CLINICAL DATA:  Increased weakness.  Leaning to the left. EXAM: CT HEAD WITHOUT CONTRAST TECHNIQUE: Contiguous axial images were obtained from the base of the skull through the vertex without  intravenous contrast. COMPARISON:  December 22, 2012 FINDINGS: Brain: No subdural, epidural, or subarachnoid hemorrhage. Cerebellum, brainstem, and basal cisterns are normal. Ventricles and sulci are unchanged. White matter changes are identified. No acute cortical ischemia or infarct. No mass effect or midline shift. Vascular: Calcified atherosclerosis in the intracranial carotids. Skull: Normal. Negative for fracture or focal lesion. Sinuses/Orbits: Fluid and mucosal thickening in the paranasal sinuses is increased since 2014. Mastoid air cells and middle ears are normal. Other: None. IMPRESSION: 1. Significant sinus disease as above. 2. No acute intracranial abnormality.  Chronic changes. Electronically Signed   By: Dorise Bullion III M.D   On: 04/01/2018 21:01   Dg Chest Port 1 View  Result Date: 04/01/2018 CLINICAL DATA:  Cough and hypotension EXAM: PORTABLE CHEST 1 VIEW COMPARISON:  03/17/2018 FINDINGS: Moderate to large left pleural effusion. Atelectasis or pneumonia at the lingula and left base. Cardiomegaly with vascular calcification. No pneumothorax. IMPRESSION: Moderate to large left pleural effusion with atelectasis or pneumonia at  the lingula and left base. Electronically Signed   By: Donavan Foil M.D.   On: 04/01/2018 19:48    Review of Systems  Constitutional: Positive for chills and malaise/fatigue. Negative for fever.  HENT: Negative for hearing loss.   Eyes: Negative for blurred vision.  Respiratory: Positive for cough, sputum production and shortness of breath.   Cardiovascular: Positive for chest pain. Negative for palpitations and orthopnea.  Gastrointestinal: Negative for abdominal pain, nausea and vomiting.  Genitourinary: Negative for dysuria.  Neurological: Positive for dizziness and weakness.    Blood pressure 98/65, pulse 98, resp. rate (!) 22, height 5' 2.5" (1.588 m), SpO2 99 %. Physical Exam  Constitutional: She is oriented to person, place, and time.  Eyes: Pupils are equal, round, and reactive to light. Conjunctivae are normal. Left eye exhibits no discharge.  Neck: Neck supple.  Cardiovascular: Normal rate and regular rhythm.  Murmur (soft systolic murmur noted) heard. Respiratory:  Clear to auscultation right lung field. Left lung dullness to percussion decreased breath sounds and rhonchi noted  GI: Soft. Bowel sounds are normal. She exhibits no distension. There is no tenderness. There is no rebound.  Musculoskeletal:  No clubbing cyanosis trace edema noted  Neurological: She is alert and oriented to person, place, and time.     Assessment/Plan Left lower lobe pneumonia with large  pleural  effusion rule out empyema Marked leukocytosis secondary to above Status post septic shock Acute renal injury History of hypertension Hyperlipidemia Hypothyroidism Anemia of chronic disease History of bronchial asthma History of GI stromal tumor in the past History of GI bleed in the past Plan As per orders Pulmonary consult [name], MD 04/01/2018, 9:13 PM

## 2018-04-01 NOTE — Consult Note (Signed)
PULMONARY / CRITICAL CARE MEDICINE   Name: Monique Lin MRN: [id number] DOB: 04-29-1927    ADMISSION DATE:  04/01/2018 CONSULTATION DATE:  04/01/2018  REFERRING MD:  Dr. [name]   CHIEF COMPLAINT:  SOB  HISTORY OF PRESENT ILLNESS:   Patient is a 84 y.o female with a PMHx significant for GIST s/p resection in 2011, hypothyroidism, HTN, and chronic bronchitis who presented to the ED with 2 weeks of progressive SOB. She was evaluated in the ED on 4/14 when her SOB initially started and at the time CXR was obtained that was without focal infiltration or opacity. She was treated acutely for bronchitis with prednisone and antihistamines. Initially she felt better until 4/21 when she began to develop generalized weakness, decreased appetite, and worsening respiratory symptoms. Today she developed altered mental status which prompted the daughter to bring her to the ED for further evaluation. She otherwise denies fevers, rhinorrhea, sore throat, abdominal pain, diarrhea, dysuria, increased urinary frequency, myalgias, arthralgias.   In the ED with patient was found to be afebrile and hemodynamically stable. Labs were significant for an elevated creatine to 3.5 from a baseline of ~1.0 and a leukocytosis. UA illustrated amorphous crystals and proteinuria but no signs of infection. CXR was remarkable for significant LL infiltrate/opacity with associated pleural effusion.   PAST MEDICAL HISTORY :  She  has a past medical history of Bronchitis, Hypertension, Leg pain, and Stomach tumor (benign).  PAST SURGICAL HISTORY: She  has a past surgical history that includes stomach tumor removal and Nasal sinus surgery.  Allergies  Allergen Reactions  . Codeine Other (See Comments)    Stomach cramps  . Penicillins Other (See Comments)    "Bad stomach cramps" Has patient had a PCN reaction causing immediate rash, facial/tongue/throat swelling, SOB or lightheadedness with hypotension: Unk Has patient had a  PCN reaction causing severe rash involving mucus membranes or skin necrosis: Unk Has patient had a PCN reaction that required hospitalization: Unk Has patient had a PCN reaction occurring within the last 10 years: No If all of the above answers are "NO", then may proceed with Cephalosporin use.     No current facility-administered medications on file prior to encounter.    Current Outpatient Medications on File Prior to Encounter  Medication Sig  . albuterol (PROVENTIL HFA;VENTOLIN HFA) 108 (90 BASE) MCG/ACT inhaler Inhale 2 puffs into the lungs every 6 (six) hours as needed for wheezing or shortness of breath.   Marland Kitchen albuterol (PROVENTIL) (2.5 MG/3ML) 0.083% nebulizer solution Take 2.5 mg by nebulization every 6 (six) hours as needed for wheezing.  Marland Kitchen amLODipine (NORVASC) 5 MG tablet Take 5 mg by mouth daily.  Marland Kitchen aspirin EC 81 MG tablet Take 81 mg by mouth daily.  Marland Kitchen atorvastatin (LIPITOR) 20 MG tablet Take 20 mg by mouth daily.  . carvedilol (COREG) 25 MG tablet Take 25 mg by mouth 2 (two) times daily with a meal.  . Cholecalciferol (VITAMIN D-3 PO) Take 1 capsule by mouth daily.  . Cyanocobalamin (VITAMIN B-12 PO) Take 500 mcg by mouth daily.   Marland Kitchen doxazosin (CARDURA) 4 MG tablet Take 4 mg by mouth at bedtime.   . fluticasone (FLONASE) 50 MCG/ACT nasal spray Place 2 sprays into the nose daily as needed for allergies or rhinitis.   Marland Kitchen FOLIC ACID PO Take 1 tablet by mouth daily.   Marland Kitchen gabapentin (NEURONTIN) 300 MG capsule Take 300 mg by mouth 3 (three) times daily.   Marland Kitchen levothyroxine (SYNTHROID, LEVOTHROID) 50 MCG  tablet Take 50 mcg by mouth daily.  . Liniments (SALONPAS PAIN RELIEF PATCH EX) Apply 1 patch topically daily as needed (pain).  Marland Kitchen losartan-hydrochlorothiazide (HYZAAR) 50-12.5 MG per tablet Take 1 tablet by mouth daily.  . traMADol (ULTRAM) 50 MG tablet Take 1 tablet (50 mg total) by mouth every 6 (six) hours as needed. (Patient taking differently: Take 50 mg by mouth See admin  instructions. Take 50 mg by mouth once a day as needed for pain and 50 mg at bedtime scheduled)  . trolamine salicylate (ASPERCREME) 10 % cream Apply 1 application topically as needed (pain).  . cefdinir (OMNICEF) 300 MG capsule Take 1 capsule (300 mg total) by mouth 2 (two) times daily. (Patient not taking: Reported on 04/01/2018)   FAMILY HISTORY:  Her has no family status information on file.   SOCIAL HISTORY: She  reports that she has never smoked. She has never used smokeless tobacco. She reports that she does not drink alcohol or use drugs.  REVIEW OF SYSTEMS:   12 point ROS preformed. All negative aside from those mentioned in the HPI.  SUBJECTIVE:  Patient is feeling well.   VITAL SIGNS: BP (!) 98/54   Pulse 93   Resp (!) 21   Ht 5' 2.5" (1.588 m)   SpO2 93%   HEMODYNAMICS:    VENTILATOR SETTINGS:    INTAKE / OUTPUT: No intake/output data recorded.  PHYSICAL EXAMINATION: General: Well nourished female in no acute distress HENT: Normocephalic, atraumatic, moist mucus membranes, hypopigmentation around the right orbit  Pulm: Diminished air movement throughout with absent breath sounds in the lower half of the left hemidiaphragm   CV: RRR, no murmurs, no rubs  Abdomen: Active bowel sounds, soft, non-distended, no tenderness to palpation  Extremities: Pulses palpable in all extremities, no LE edema  Skin: Warm and dry  Neuro: Alert and oriented x 3  LABS: BMET Recent Labs  Lab 04/01/18 1928 04/01/18 1937  NA 134* 136  K 4.3 4.3  CL 100* 104  CO2 20*  --   BUN 47* 40*  CREATININE 3.59* 3.50*  GLUCOSE 150* 147*   Electrolytes Recent Labs  Lab 04/01/18 1928  CALCIUM 9.0   CBC Recent Labs  Lab 04/01/18 1928 04/01/18 1937  WBC 25.9*  --   HGB 9.8* 10.5*  HCT 29.4* 31.0*  PLT 268  --    Coag's Recent Labs  Lab 04/01/18 1928  APTT 39*  INR 1.36   Sepsis Markers Recent Labs  Lab 04/01/18 1937  LATICACIDVEN 1.97*   ABG No results for  input(s): PHART, PCO2ART, PO2ART in the last 168 hours.  Liver Enzymes Recent Labs  Lab 04/01/18 1928  AST 29  ALT 16  ALKPHOS 106  BILITOT 1.5*  ALBUMIN 2.3*   Cardiac Enzymes No results for input(s): TROPONINI, PROBNP in the last 168 hours.  Glucose Recent Labs  Lab 04/01/18 1904  GLUCAP 131*   Imaging Ct Head Wo Contrast  Result Date: 04/01/2018 CLINICAL DATA:  Increased weakness.  Leaning to the left. EXAM: CT HEAD WITHOUT CONTRAST TECHNIQUE: Contiguous axial images were obtained from the base of the skull through the vertex without intravenous contrast. COMPARISON:  December 22, 2012 FINDINGS: Brain: No subdural, epidural, or subarachnoid hemorrhage. Cerebellum, brainstem, and basal cisterns are normal. Ventricles and sulci are unchanged. White matter changes are identified. No acute cortical ischemia or infarct. No mass effect or midline shift. Vascular: Calcified atherosclerosis in the intracranial carotids. Skull: Normal. Negative for fracture or focal  lesion. Sinuses/Orbits: Fluid and mucosal thickening in the paranasal sinuses is increased since 2014. Mastoid air cells and middle ears are normal. Other: None. IMPRESSION: 1. Significant sinus disease as above. 2. No acute intracranial abnormality.  Chronic changes. Electronically Signed   By: Dorise Bullion III M.D   On: 04/01/2018 21:01   Dg Chest Port 1 View  Result Date: 04/01/2018 CLINICAL DATA:  Cough and hypotension EXAM: PORTABLE CHEST 1 VIEW COMPARISON:  03/17/2018 FINDINGS: Moderate to large left pleural effusion. Atelectasis or pneumonia at the lingula and left base. Cardiomegaly with vascular calcification. No pneumothorax. IMPRESSION: Moderate to large left pleural effusion with atelectasis or pneumonia at the lingula and left base. Electronically Signed   By: Donavan Foil M.D.   On: 04/01/2018 19:48   CULTURES: 4/29: Blood cultures  4/29: Urine cultures  ANTIBIOTICS: Azithromycin 4/29 -> Ceftriaxone 4/29  ->  SIGNIFICANT EVENTS: Admitted on 4/29 with SOB secondary to a left pleural effusion >>>  LINES/TUBES: Right peripheral IV  Left peripheral IV  DISCUSSION: Discussed with patient and family at bedside the patient's presenting symptoms, treatment course, and code status. All questions and concerns addressed.   ASSESSMENT / PLAN: Patient is a 84 y.o female with a PMHx significant for GIST s/p resection in 2011, hypothyroidism, HTN, and chronic bronchitis who presented to the ED with 2 weeks of progressive SOB. She was subsequently found to have a left pleural effusion, leukocytosis, and acute kidney injury.   LLL PNA Pleural Effusion  - Continue empiric treatment with Ceftriaxone and Azithromycin  - Differential for a pleural new pleural effusion is large. Will need thoracentesis to further investigate. If parapneumonic this will help to decide treatment course (complicated vs uncomplicated). - Follow blood cultures  - Check urine strep and urine legionella antigens  - Check procalcitonin  - Check viral respiratory panel   Acute Kidney Injury  - Creatinine elevated to ~3.5 from a baseline of ~1.0  - Obtain urine studies and renal ultrasound  - Continue to trend creatinine and monitor urine output  - Continue IVF  Rest per primary.  Pulmonary and [place] Pager: [phone]  04/01/2018, 10:00 PM

## 2018-04-01 NOTE — ED Notes (Signed)
Patient transported to Ultrasound 

## 2018-04-01 NOTE — ED Provider Notes (Signed)
[facility] Provider Note   CSN: [id number] Arrival date & time: 04/01/18  7209     History   Chief Complaint Chief Complaint  Patient presents with  . Weakness    HPI Monique Lin is a 84 y.o. female.  HPI  The patient is an 84 year old female, history of hypertension, history of a benign stomach tumor, history of bronchitis who recently had a visit approximately 2 weeks ago for some shortness of breath during which time she was treated and released.  The patient presents with her daughter who is the primary historian and reports that over the last 7 days her mother has had a significant progressive weakness with mostly generalized weakness however she has noted that today she is falling to the left and unable to hold herself up in the chair.  She is also noticed that she has had some slurred speech.  It is unclear whether the speech started in the afternoon or whether she woke up with it, the family is unable to give me a clear history on that.  In fact she reports that she may have had some difficulty with focal weakness and speech yesterday.  The timing is not clear, the patient is not anticoagulated, the patient is unable to give much in the way of information other than stating that she is continuing to cough after a couple of weeks.  Symptoms have been persistent, nothing seems to make this better or worse, no associated fevers or vomiting or diarrhea.  Past Medical History:  Diagnosis Date  . Bronchitis   . Hypertension   . Leg pain   . Stomach tumor (benign)     Patient Active Problem List   Diagnosis Date Noted  . Bronchitis     Past Surgical History:  Procedure Laterality Date  . NASAL SINUS SURGERY    . stomach tumor removal       OB History   None      Home Medications    Prior to Admission medications   Medication Sig Start Date End Date Taking? Authorizing Provider  albuterol (PROVENTIL HFA;VENTOLIN HFA) 108 (90  BASE) MCG/ACT inhaler Inhale 2 puffs into the lungs every 6 (six) hours as needed for wheezing.    [provider]  albuterol (PROVENTIL) (2.5 MG/3ML) 0.083% nebulizer solution Take 2.5 mg by nebulization every 6 (six) hours as needed for wheezing.    [provider]  amLODipine (NORVASC) 5 MG tablet Take 5 mg by mouth daily.    [provider]  aspirin 81 MG chewable tablet Chew 81 mg by mouth daily.    [provider]  aspirin EC 81 MG tablet Take 81 mg by mouth daily.    [provider]  atorvastatin (LIPITOR) 20 MG tablet Take 20 mg by mouth daily.    [provider]  carvedilol (COREG) 25 MG tablet Take 25 mg by mouth 2 (two) times daily with a meal.    [provider]  cefdinir (OMNICEF) 300 MG capsule Take 1 capsule (300 mg total) by mouth 2 (two) times daily. Patient not taking: Reported on 03/17/2018 05/17/13   Orpah Greek, MD  Cyanocobalamin (VITAMIN B-12 PO) Take 500 mg by mouth daily.     [provider]  doxazosin (CARDURA) 4 MG tablet Take 4 mg by mouth daily.     [provider]  fluticasone (FLONASE) 50 MCG/ACT nasal spray Place 2 sprays into the nose daily.  [provider]  FOLIC ACID PO Take 956 mg by mouth daily.    [provider]  gabapentin (NEURONTIN) 300 MG capsule Take 300 mg by mouth 3 (three) times daily.     [provider]  levothyroxine (SYNTHROID, LEVOTHROID) 50 MCG tablet Take 50 mcg by mouth daily.    [provider]  Liniments (SALONPAS PAIN RELIEF PATCH EX) Apply 1 patch topically daily as needed (pain).    [provider]  losartan-hydrochlorothiazide (HYZAAR) 50-12.5 MG per tablet Take 1 tablet by mouth daily.    [provider]  predniSONE (DELTASONE) 20 MG tablet Take 1 tablet (20 mg total) by mouth 2 (two) times daily. 03/17/18   Daleen Bo, MD  traMADol (ULTRAM) 50 MG tablet Take 1 tablet (50 mg total) by mouth  every 6 (six) hours as needed. 03/17/18   Daleen Bo, MD  trolamine salicylate (ASPERCREME) 10 % cream Apply 1 application topically as needed (pain).    [provider]    Family History No family history on file.  Social History Social History   Tobacco Use  . Smoking status: Never Smoker  . Smokeless tobacco: Never Used  Substance Use Topics  . Alcohol use: No  . Drug use: No     Allergies   Codeine   Review of Systems Review of Systems  All other systems reviewed and are negative.    Physical Exam Updated Vital Signs BP (!) 122/58   Pulse 94   Resp 19   Ht 5' 2.5" (1.588 m)   SpO2 99%   Physical Exam  Constitutional: She appears well-developed and well-nourished.  Generally ill-appearing and weak  HENT:  Head: Normocephalic and atraumatic.  Mouth/Throat: Oropharynx is clear and moist. No oropharyngeal exudate.  Eyes: Pupils are equal, round, and reactive to light. EOM are normal. Right eye exhibits no discharge. Left eye exhibits no discharge. No scleral icterus.  Pale conjunctive a  Neck: Normal range of motion. Neck supple. No JVD present. No thyromegaly present.  Cardiovascular: Normal rate, regular rhythm, normal heart sounds and intact distal pulses. Exam reveals no gallop and no friction rub.  No murmur heard. Pulmonary/Chest: Effort normal and breath sounds normal. No respiratory distress. She has no wheezes. She has no rales.  The patient has some rhonchi, no increased work of breathing  Abdominal: Soft. Bowel sounds are normal. She exhibits no distension and no mass. There is no tenderness.  Musculoskeletal: Normal range of motion. She exhibits edema. She exhibits no tenderness.  Lymphadenopathy:    She has no cervical adenopathy.  Neurological: She is alert. Coordination normal.  The patient is able to smile symmetrically, she slumped to the left and has some left-sided weakness of the arm, her leg seems to fall out to the left and she  has difficulty holding her legs on the wheelchair.  Her speech does not appear to be slurred but she is diffusely generally weak  Skin: Skin is warm and dry. No rash noted. No erythema.  Psychiatric: She has a normal mood and affect. Her behavior is normal.  Nursing note and vitals reviewed.    ED Treatments / Results  Labs (all labs ordered are listed, but only abnormal results are displayed) Labs Reviewed  PROTIME-INR - Abnormal; Notable for the following components:      Result Value   Prothrombin Time 16.6 (*)    All other components within normal limits  APTT - Abnormal; Notable for the following components:  aPTT 39 (*)    All other components within normal limits  CBC - Abnormal; Notable for the following components:   WBC 25.9 (*)    RBC 3.27 (*)    Hemoglobin 9.8 (*)    HCT 29.4 (*)    All other components within normal limits  CBG MONITORING, ED - Abnormal; Notable for the following components:   Glucose-Capillary 131 (*)    All other components within normal limits  I-STAT CHEM 8, ED - Abnormal; Notable for the following components:   BUN 40 (*)    Creatinine, Ser 3.50 (*)    Glucose, Bld 147 (*)    Calcium, Ion 1.12 (*)    Hemoglobin 10.5 (*)    HCT 31.0 (*)    All other components within normal limits  I-STAT CG4 LACTIC ACID, ED - Abnormal; Notable for the following components:   Lactic Acid, Venous 1.97 (*)    All other components within normal limits  CULTURE, BLOOD (ROUTINE X 2)  CULTURE, BLOOD (ROUTINE X 2)  URINE CULTURE  DIFFERENTIAL  ETHANOL  COMPREHENSIVE METABOLIC PANEL  RAPID URINE DRUG SCREEN, HOSP PERFORMED  URINALYSIS, ROUTINE W REFLEX MICROSCOPIC  I-STAT TROPONIN, ED    EKG EKG Interpretation  Date/Time:  Monday April 01 2018 19:23:44 EDT Ventricular Rate:  86 PR Interval:    QRS Duration: 89 QT Interval:  328 QTC Calculation: 393 R Axis:   70 Text Interpretation:  Sinus rhythm Poor R wave progression Abnormal ekg since last  tracing no significant change Confirmed by Noemi Chapel [phone]) on 04/01/2018 7:29:29 PM   Radiology Dg Chest Port 1 View  Result Date: 04/01/2018 CLINICAL DATA:  Cough and hypotension EXAM: PORTABLE CHEST 1 VIEW COMPARISON:  03/17/2018 FINDINGS: Moderate to large left pleural effusion. Atelectasis or pneumonia at the lingula and left base. Cardiomegaly with vascular calcification. No pneumothorax. IMPRESSION: Moderate to large left pleural effusion with atelectasis or pneumonia at the lingula and left base. Electronically Signed   By: Donavan Foil M.D.   On: 04/01/2018 19:48    Procedures .Critical Care Performed by: Noemi Chapel, MD Authorized by: Noemi Chapel, MD   Critical care provider statement:    Critical care time (minutes):  35   Critical care time was exclusive of:  Separately billable procedures and treating other patients and teaching time   Critical care was necessary to treat or prevent imminent or life-threatening deterioration of the following conditions:  Sepsis   Critical care was time spent personally by me on the following activities:  Blood draw for specimens, development of treatment plan with patient or surrogate, discussions with consultants, evaluation of patient's response to treatment, examination of patient, obtaining history from patient or surrogate, ordering and performing treatments and interventions, ordering and review of laboratory studies, ordering and review of radiographic studies, pulse oximetry, re-evaluation of patient's condition and review of old charts   (including critical care time)  Medications Ordered in ED Medications  sodium chloride 0.9 % bolus 2,000 mL (has no administration in time range)  cefTRIAXone (ROCEPHIN) 2 g in sodium chloride 0.9 % 100 mL IVPB (has no administration in time range)  azithromycin (ZITHROMAX) 500 mg in sodium chloride 0.9 % 250 mL IVPB (has no administration in time range)  albuterol (PROVENTIL,VENTOLIN)  solution continuous neb (10 mg/hr Nebulization Given 04/01/18 1945)     Initial Impression / Assessment and Plan / ED Course  I have reviewed the triage vital signs and the nursing notes.  Pertinent labs &  imaging results that were available during my care of the patient were reviewed by me and considered in my medical decision making (see chart for details).     Given the patient's decline over the week with what appears to be focal weakness on the left side I am suspicious for stroke, this could also be related to metabolic issue or progressive respiratory issue given her generalized weakness and progressive cough.  We will proceed with stroke work-up though she does not fit in the window for a code stroke.  Her peripheral visual fields seem okay and she is able to move her eyes right to left, there is no neglect and it is difficult to pinpoint the start of the symptoms but does not seem to be within the first 24 hours  Patient has a very abnormal chest x-ray with a left-sided large effusion and likely infiltrate, she also has a very high white blood cell count of over 25,000 and given her hypotension I suspect that the patient is septic and likely in severe sepsis though thankfully her lactic acid was just less than 2 and her blood pressure has responded to IV fluid bolus resuscitation.  On a repeat exam the patient does not fact have better pulses, better color, she still has decreased lung sounds on the left and is requiring supplemental oxygen.  Creatinine is up to 3-1/2 whereas it used to be normal (0.9)  2 weeks ago.  The patient is critically ill, consultation was requested from her internal medicine physician for admission, Dr. [name].  Dr. [name] to admit - d/w him at 8:30 PM  Final Clinical Impressions(s) / ED Diagnoses   Final diagnoses:  Severe sepsis ([place])  Pleural effusion on left  Community acquired pneumonia of left lower lobe of lung ([place])  Acute kidney injury ([place])       Noemi Chapel, MD 04/01/18 2035

## 2018-04-01 NOTE — ED Notes (Signed)
Called phlebotomy to draw labs.

## 2018-04-02 ENCOUNTER — Inpatient Hospital Stay (HOSPITAL_COMMUNITY): Payer: Medicare Other

## 2018-04-02 DIAGNOSIS — A419 Sepsis, unspecified organism: Principal | ICD-10-CM

## 2018-04-02 DIAGNOSIS — J181 Lobar pneumonia, unspecified organism: Secondary | ICD-10-CM

## 2018-04-02 DIAGNOSIS — N179 Acute kidney failure, unspecified: Secondary | ICD-10-CM

## 2018-04-02 DIAGNOSIS — J90 Pleural effusion, not elsewhere classified: Secondary | ICD-10-CM

## 2018-04-02 DIAGNOSIS — R6520 Severe sepsis without septic shock: Secondary | ICD-10-CM

## 2018-04-02 DIAGNOSIS — G9340 Encephalopathy, unspecified: Secondary | ICD-10-CM

## 2018-04-02 LAB — RESPIRATORY PANEL BY PCR
ADENOVIRUS-RVPPCR: NOT DETECTED
Bordetella pertussis: NOT DETECTED
CORONAVIRUS HKU1-RVPPCR: NOT DETECTED
CORONAVIRUS OC43-RVPPCR: NOT DETECTED
Chlamydophila pneumoniae: NOT DETECTED
Coronavirus 229E: NOT DETECTED
Coronavirus NL63: NOT DETECTED
Influenza A: NOT DETECTED
Influenza B: NOT DETECTED
METAPNEUMOVIRUS-RVPPCR: NOT DETECTED
Mycoplasma pneumoniae: NOT DETECTED
PARAINFLUENZA VIRUS 1-RVPPCR: NOT DETECTED
PARAINFLUENZA VIRUS 2-RVPPCR: NOT DETECTED
PARAINFLUENZA VIRUS 3-RVPPCR: NOT DETECTED
Parainfluenza Virus 4: NOT DETECTED
RHINOVIRUS / ENTEROVIRUS - RVPPCR: NOT DETECTED
Respiratory Syncytial Virus: NOT DETECTED

## 2018-04-02 LAB — I-STAT ARTERIAL BLOOD GAS, ED
Acid-base deficit: 5 mmol/L — ABNORMAL HIGH (ref 0.0–2.0)
BICARBONATE: 17.5 mmol/L — AB (ref 20.0–28.0)
O2 Saturation: 100 %
Patient temperature: 97.9
TCO2: 18 mmol/L — AB (ref 22–32)
pCO2 arterial: 23.8 mmHg — ABNORMAL LOW (ref 32.0–48.0)
pH, Arterial: 7.472 — ABNORMAL HIGH (ref 7.350–7.450)
pO2, Arterial: 191 mmHg — ABNORMAL HIGH (ref 83.0–108.0)

## 2018-04-02 LAB — BASIC METABOLIC PANEL
ANION GAP: 9 (ref 5–15)
BUN: 46 mg/dL — ABNORMAL HIGH (ref 6–20)
CHLORIDE: 107 mmol/L (ref 101–111)
CO2: 20 mmol/L — ABNORMAL LOW (ref 22–32)
Calcium: 8.1 mg/dL — ABNORMAL LOW (ref 8.9–10.3)
Creatinine, Ser: 2.92 mg/dL — ABNORMAL HIGH (ref 0.44–1.00)
GFR calc Af Amer: 15 mL/min — ABNORMAL LOW (ref >60)
GFR, EST NON AFRICAN AMERICAN: 13 mL/min — AB (ref >60)
Glucose, Bld: 158 mg/dL — ABNORMAL HIGH (ref 65–99)
POTASSIUM: 3.9 mmol/L (ref 3.5–5.1)
SODIUM: 136 mmol/L (ref 135–145)

## 2018-04-02 LAB — CBC
HEMATOCRIT: 25.1 % — AB (ref 36.0–46.0)
HEMATOCRIT: 26.8 % — AB (ref 36.0–46.0)
HEMOGLOBIN: 8.3 g/dL — AB (ref 12.0–15.0)
HEMOGLOBIN: 8.7 g/dL — AB (ref 12.0–15.0)
MCH: 29.1 pg (ref 26.0–34.0)
MCH: 29.6 pg (ref 26.0–34.0)
MCHC: 32.5 g/dL (ref 30.0–36.0)
MCHC: 33.1 g/dL (ref 30.0–36.0)
MCV: 89.6 fL (ref 78.0–100.0)
MCV: 89.6 fL (ref 78.0–100.0)
Platelets: 238 10*3/uL (ref 150–400)
Platelets: 252 10*3/uL (ref 150–400)
RBC: 2.8 MIL/uL — ABNORMAL LOW (ref 3.87–5.11)
RBC: 2.99 MIL/uL — ABNORMAL LOW (ref 3.87–5.11)
RDW: 14.4 % (ref 11.5–15.5)
RDW: 14.4 % (ref 11.5–15.5)
WBC: 27.4 10*3/uL — ABNORMAL HIGH (ref 4.0–10.5)
WBC: 28.2 10*3/uL — AB (ref 4.0–10.5)

## 2018-04-02 LAB — CREATININE, SERUM
Creatinine, Ser: 2.98 mg/dL — ABNORMAL HIGH (ref 0.44–1.00)
GFR calc non Af Amer: 13 mL/min — ABNORMAL LOW (ref >60)
GFR, EST AFRICAN AMERICAN: 15 mL/min — AB (ref >60)

## 2018-04-02 LAB — MRSA PCR SCREENING: MRSA BY PCR: NEGATIVE

## 2018-04-02 LAB — GLUCOSE, CAPILLARY: GLUCOSE-CAPILLARY: 126 mg/dL — AB (ref 65–99)

## 2018-04-02 LAB — I-STAT CG4 LACTIC ACID, ED: Lactic Acid, Venous: 0.94 mmol/L (ref 0.5–1.9)

## 2018-04-02 LAB — HIV ANTIBODY (ROUTINE TESTING W REFLEX): HIV Screen 4th Generation wRfx: NONREACTIVE

## 2018-04-02 LAB — TSH: TSH: 0.977 u[IU]/mL (ref 0.350–4.500)

## 2018-04-02 LAB — STREP PNEUMONIAE URINARY ANTIGEN: STREP PNEUMO URINARY ANTIGEN: NEGATIVE

## 2018-04-02 LAB — INFLUENZA PANEL BY PCR (TYPE A & B)
INFLBPCR: NEGATIVE
Influenza A By PCR: NEGATIVE

## 2018-04-02 LAB — LACTIC ACID, PLASMA: LACTIC ACID, VENOUS: 0.9 mmol/L (ref 0.5–1.9)

## 2018-04-02 LAB — SODIUM, URINE, RANDOM

## 2018-04-02 LAB — CREATININE, URINE, RANDOM: CREATININE, URINE: 338.27 mg/dL

## 2018-04-02 MED ORDER — PIPERACILLIN-TAZOBACTAM IN DEX 2-0.25 GM/50ML IV SOLN
2.2500 g | Freq: Three times a day (TID) | INTRAVENOUS | Status: DC
  Filled 2018-04-02: qty 50

## 2018-04-02 MED ORDER — SODIUM CHLORIDE 0.9 % IV BOLUS
500.0000 mL | Freq: Once | INTRAVENOUS | Status: AC
  Administered 2018-04-02: 500 mL via INTRAVENOUS

## 2018-04-02 MED ORDER — SODIUM CHLORIDE 0.9 % IV BOLUS
1000.0000 mL | Freq: Once | INTRAVENOUS | Status: AC
  Administered 2018-04-02: 1000 mL via INTRAVENOUS

## 2018-04-02 MED ORDER — VANCOMYCIN HCL IN DEXTROSE 1-5 GM/200ML-% IV SOLN: 1000.0000 mg | INTRAVENOUS | Status: DC

## 2018-04-02 MED ORDER — ORAL CARE MOUTH RINSE
15.0000 mL | Freq: Two times a day (BID) | OROMUCOSAL | Status: DC
  Administered 2018-04-02 – 2018-04-25 (×30): 15 mL via OROMUCOSAL

## 2018-04-02 MED ORDER — DEXTROSE 5 % IV SOLN
0.0000 ug/min | Freq: Once | INTRAVENOUS | Status: AC
  Administered 2018-04-02: 2 ug/min via INTRAVENOUS
  Filled 2018-04-02: qty 4

## 2018-04-02 MED ORDER — DOCUSATE SODIUM 100 MG PO CAPS
100.0000 mg | ORAL_CAPSULE | Freq: Every day | ORAL | Status: DC
  Administered 2018-04-02 – 2018-04-24 (×20): 100 mg via ORAL
  Filled 2018-04-02 (×23): qty 1

## 2018-04-02 MED ORDER — POLYETHYLENE GLYCOL 3350 17 G PO PACK
17.0000 g | PACK | Freq: Every day | ORAL | Status: DC | PRN
  Administered 2018-04-06 – 2018-04-07 (×2): 17 g via ORAL
  Filled 2018-04-02 (×3): qty 1

## 2018-04-02 MED ORDER — SODIUM CHLORIDE 0.9 % IV SOLN
2.0000 g | INTRAVENOUS | Status: DC
  Administered 2018-04-02 – 2018-04-05 (×4): 2 g via INTRAVENOUS
  Filled 2018-04-02 (×4): qty 20

## 2018-04-02 MED ORDER — OXYCODONE HCL 5 MG PO TABS
5.0000 mg | ORAL_TABLET | ORAL | Status: DC | PRN
  Administered 2018-04-02 – 2018-04-06 (×8): 5 mg via ORAL
  Filled 2018-04-02 (×10): qty 1

## 2018-04-02 MED ORDER — NOREPINEPHRINE BITARTRATE 1 MG/ML IV SOLN
0.0000 ug/min | INTRAVENOUS | Status: DC
  Administered 2018-04-02: 2 ug/min via INTRAVENOUS
  Administered 2018-04-03: 1 ug/min via INTRAVENOUS
  Filled 2018-04-02: qty 4

## 2018-04-02 MED ORDER — SODIUM CHLORIDE 0.9 % IV SOLN
500.0000 mg | INTRAVENOUS | Status: AC
  Administered 2018-04-02 – 2018-04-07 (×6): 500 mg via INTRAVENOUS
  Filled 2018-04-02 (×8): qty 500

## 2018-04-02 NOTE — Procedures (Signed)
Thoracentesis.  After obtaining informed consent and performing a timeout a thoracentesis was attempted suspecting an underlying empyema.  Her ultrasound guidance and echo-free zone was identified beneath the pleura at approximately the T7 level posteriorly.  The skin was prepped and the area sterilely draped.  A small quantity of 1% lidocaine was used for local anesthesia.  The skin was sharply incised and a thoracentesis catheter placed.  It popped through the pleura was palpated and the catheter gently advanced.  I was unable to aspirate any significant amount of material from the catheter I only got about 1/4 cc of very viscous material.    I am concerned that she is going to require a large bore drainage and have ordered a CT scan of the chest to plan that procedure.

## 2018-04-02 NOTE — ED Notes (Signed)
Respiratory called for arterial blood gas  

## 2018-04-02 NOTE — Progress Notes (Signed)
PULMONARY / CRITICAL CARE MEDICINE   Name: Monique Lin MRN: [id number] DOB: [birth year]    ADMISSION DATE:  04/01/2018 CONSULTATION DATE:  04/01/2018  REFERRING MD:  Dr. [name]   CHIEF COMPLAINT:  SOB  BRIEF SUMMARY:  84 y/o F with a PMHx significant for GIST s/p resection in 2011, hypothyroidism, HTN, and chronic bronchitis who presented to the ED 4/29 with 2 weeks of progressive SOB. She was evaluated in the ED on 4/14 when her SOB initially started and at the time CXR was obtained that was without focal infiltration or opacity. She was treated acutely for bronchitis with prednisone and antihistamines. Initially she felt better until 4/21 when she began to develop generalized weakness, decreased appetite, and worsening respiratory symptoms. On 4/29 she developed altered mental status which prompted the daughter to bring her to the ED for further evaluation.   In the ED with patient was found to be afebrile and hemodynamically stable. Labs were significant for an elevated creatine to 3.5 from a baseline of ~1.0 and a leukocytosis. UA illustrated amorphous crystals and proteinuria but no signs of infection. CXR was remarkable for significant LL infiltrate/opacity with associated pleural effusion.    SUBJECTIVE: Pt reports feeling "rotten".  Continues to have pain in left side.  Levophed at 7 mcg's, MAP's > 65.     VITAL SIGNS: BP (!) 107/52   Pulse 85   Temp 97.9 F (36.6 C)   Resp (!) 21   Ht 5' 2.5" (1.588 m)   SpO2 94%   HEMODYNAMICS:    VENTILATOR SETTINGS:    INTAKE / OUTPUT: I/O last 3 completed shifts: In: 3500 [I.V.:1000; IV Piggyback:2500] Out: -   PHYSICAL EXAMINATION: General: frail elderly female in NAD lying in bed HEENT: MM pink/moist, good dentition Neuro: Awakens to voice, alert, MAE, generalized weakness  CV: s1s2 rrr, no m/r/g PULM: even/non-labored, clear on R, diminished L lower  GG:EZMO, non-tender, bsx4 active  Extremities: warm/dry, no edema   Skin: no rashes or lesions.  Area of decreased pigmentation to nose, R eye  LABS: BMET Recent Labs  Lab 04/01/18 1928 04/01/18 1937 04/01/18 2347 04/02/18 0155  NA 134* 136  --  136  K 4.3 4.3  --  3.9  CL 100* 104  --  107  CO2 20*  --   --  20*  BUN 47* 40*  --  46*  CREATININE 3.59* 3.50* 2.98* 2.92*  GLUCOSE 150* 147*  --  158*   Electrolytes Recent Labs  Lab 04/01/18 1928 04/02/18 0155  CALCIUM 9.0 8.1*   CBC Recent Labs  Lab 04/01/18 1928 04/01/18 1937 04/01/18 2347 04/02/18 0155  WBC 25.9*  --  27.4* 28.2*  HGB 9.8* 10.5* 8.7* 8.3*  HCT 29.4* 31.0* 26.8* 25.1*  PLT 268  --  252 238   Coag's Recent Labs  Lab 04/01/18 1928  APTT 39*  INR 1.36   Sepsis Markers Recent Labs  Lab 04/01/18 1928 04/01/18 1937 04/02/18 0104 04/02/18 0155  LATICACIDVEN  --  1.97* 0.94 0.9  PROCALCITON 2.63  --   --   --    ABG Recent Labs  Lab 04/02/18 0158  PHART 7.472*  PCO2ART 23.8*  PO2ART 191.0*    Liver Enzymes Recent Labs  Lab 04/01/18 1928  AST 29  ALT 16  ALKPHOS 106  BILITOT 1.5*  ALBUMIN 2.3*   Cardiac Enzymes No results for input(s): TROPONINI, PROBNP in the last 168 hours.  Glucose Recent Labs  Lab  04/01/18 1904  GLUCAP 131*   Imaging Ct Head Wo Contrast  Result Date: 04/01/2018 CLINICAL DATA:  Increased weakness.  Leaning to the left. EXAM: CT HEAD WITHOUT CONTRAST TECHNIQUE: Contiguous axial images were obtained from the base of the skull through the vertex without intravenous contrast. COMPARISON:  December 22, 2012 FINDINGS: Brain: No subdural, epidural, or subarachnoid hemorrhage. Cerebellum, brainstem, and basal cisterns are normal. Ventricles and sulci are unchanged. White matter changes are identified. No acute cortical ischemia or infarct. No mass effect or midline shift. Vascular: Calcified atherosclerosis in the intracranial carotids. Skull: Normal. Negative for fracture or focal lesion. Sinuses/Orbits: Fluid and mucosal  thickening in the paranasal sinuses is increased since 2014. Mastoid air cells and middle ears are normal. Other: None. IMPRESSION: 1. Significant sinus disease as above. 2. No acute intracranial abnormality.  Chronic changes. Electronically Signed   By: Dorise Bullion III M.D   On: 04/01/2018 21:01   US Renal  Result Date: 04/02/2018 CLINICAL DATA:  Acute kidney injury EXAM: RENAL / URINARY TRACT ULTRASOUND COMPLETE COMPARISON:  CT 09/20/2010 FINDINGS: Right Kidney: Length: 12.1 cm.  Slightly echogenic cortex.  No hydronephrosis. Left Kidney: Length: 10.2 cm. Slightly echogenic cortex. No hydronephrosis. Cyst in the midpole measuring 2.5 x 2.7 x 2.7 cm. Cyst in the upper pole measuring 1.6 x 1 x 1.3 cm. Bladder: Appears normal for degree of bladder distention. Small amount of ascites in the pelvis IMPRESSION: 1. Negative for hydronephrosis. Slightly echogenic cortex suggesting medical renal disease 2. Cysts in the left kidney 3. Small amount of free fluid in the pelvis Electronically Signed   By: Donavan Foil M.D.   On: 04/02/2018 00:37   Dg Chest Port 1 View  Result Date: 04/01/2018 CLINICAL DATA:  Cough and hypotension EXAM: PORTABLE CHEST 1 VIEW COMPARISON:  03/17/2018 FINDINGS: Moderate to large left pleural effusion. Atelectasis or pneumonia at the lingula and left base. Cardiomegaly with vascular calcification. No pneumothorax. IMPRESSION: Moderate to large left pleural effusion with atelectasis or pneumonia at the lingula and left base. Electronically Signed   By: Donavan Foil M.D.   On: 04/01/2018 19:48   STUDIES CT Head/Neck 4/29 >> no significant sinus disease, no acute abnormality  Renal US 4/29 >> negative for hydronephrosis  CULTURES: BCx2 4/29 >>  UC 4/29 >>  RVP 4/29 >> negative  U. Strep antigen 4/29 >> negative  U. Legionella 4/29 >>  HIV 4/29 >>   ANTIBIOTICS: Azithromycin 4/29 >>  Ceftriaxone 4/29 >>    SIGNIFICANT EVENTS: 4/29  Admit with SOB secondary to a left  pleural effusion, possible PNA    LINES/TUBES:   DISCUSSION: 84 y.o female with a PMHx significant for GIST s/p resection in 2011, hypothyroidism, HTN, and chronic bronchitis who presented to the ED with 2 weeks of progressive SOB. She was subsequently found to have a left pleural effusion, leukocytosis, and acute kidney injury.  Developed hypotension requiring levophed.  Lactic cleared, AKI resolving.   ASSESSMENT / PLAN:  A: LLL PNA / CAP - strep antigen negative, cultures pending Pleural Effusion  P: Continue rocephin / azithromycin for CAP (she has not had antibiotics in the last 3 months) O2 as needed to support sats >90%  Follow cultures as above Assess left pleural space with [place] for thoracentesis (consolidation vs effusion) Pulmonary hygiene - flutter, mobilize   A: Septic Shock secondary to CAP  P: Levophed for MAP > 65  Hold home antihypertensives  Trend PCT   A: Acute Kidney Injury -  in setting of sepsis secondary to CAP.  Renal US negative, sr cr improving Lactic Acidosis  P: Trend BMP / urinary output Replace electrolytes as indicated Avoid nephrotoxic agents, ensure adequate renal perfusion NS @ 141ml/hr    Noe Gens, NP-C [facility] Pgr: [phone] or if no answer [phone] 04/02/2018, 9:13 AM

## 2018-04-02 NOTE — ED Notes (Addendum)
Pt becoming more hypotensive paged admitting and verbal ordered 500 ml bolus. Pt still A=O x4 and states she feels normal

## 2018-04-02 NOTE — ED Notes (Signed)
Pt repositioned in bed, propped onto L hip with pillows behind her back. Pt appears comfortable at present

## 2018-04-02 NOTE — ED Notes (Signed)
Pt assisted to bedside commode

## 2018-04-02 NOTE — Progress Notes (Signed)
Subjective:  Patient awake complains of left-sided pleuritic chest pain. Remains hypotensive was started on Levophed  last night.  Objective:  Vital Signs in the last 24 hours: Temp:  [97.9 F (36.6 C)] 97.9 F (36.6 C) (04/30 0105) Pulse Rate:  [74-98] 85 (04/30 0855) Resp:  [17-27] 21 (04/30 0855) BP: (73-123)/(34-99) 107/52 (04/30 0855) SpO2:  [90 %-100 %] 94 % (04/30 0855)  Intake/Output from previous day: 04/29 0701 - 04/30 0700 In: 3500 [I.V.:1000; IV Piggyback:2500] Out: -  Intake/Output from this shift: No intake/output data recorded.  Physical Exam: Neck: no adenopathy, no carotid bruit, no JVD and supple, symmetrical, trachea midline Lungs: Right lung clear to auscultation left lung field decreased breath sounds with rhonchi noted Heart: regular rate and rhythm, S1, S2 normal and Soft systolic murmur noted Abdomen: soft, non-tender; bowel sounds normal; no masses,  no organomegaly Extremities: extremities normal, atraumatic, no cyanosis or edema  Lab Results: Recent Labs    04/01/18 2347 04/02/18 0155  WBC 27.4* 28.2*  HGB 8.7* 8.3*  PLT 252 238   Recent Labs    04/01/18 1928 04/01/18 1937 04/01/18 2347 04/02/18 0155  NA 134* 136  --  136  K 4.3 4.3  --  3.9  CL 100* 104  --  107  CO2 20*  --   --  20*  GLUCOSE 150* 147*  --  158*  BUN 47* 40*  --  46*  CREATININE 3.59* 3.50* 2.98* 2.92*   No results for input(s): TROPONINI in the last 72 hours.  Invalid input(s): CK, MB Hepatic Function Panel Recent Labs    04/01/18 1928  PROT 6.9  ALBUMIN 2.3*  AST 29  ALT 16  ALKPHOS 106  BILITOT 1.5*   No results for input(s): CHOL in the last 72 hours. No results for input(s): PROTIME in the last 72 hours.  Imaging: Imaging results have been reviewed and Ct Head Wo Contrast  Result Date: 04/01/2018 CLINICAL DATA:  Increased weakness.  Leaning to the left. EXAM: CT HEAD WITHOUT CONTRAST TECHNIQUE: Contiguous axial images were obtained from the base  of the skull through the vertex without intravenous contrast. COMPARISON:  December 22, 2012 FINDINGS: Brain: No subdural, epidural, or subarachnoid hemorrhage. Cerebellum, brainstem, and basal cisterns are normal. Ventricles and sulci are unchanged. White matter changes are identified. No acute cortical ischemia or infarct. No mass effect or midline shift. Vascular: Calcified atherosclerosis in the intracranial carotids. Skull: Normal. Negative for fracture or focal lesion. Sinuses/Orbits: Fluid and mucosal thickening in the paranasal sinuses is increased since 2014. Mastoid air cells and middle ears are normal. Other: None. IMPRESSION: 1. Significant sinus disease as above. 2. No acute intracranial abnormality.  Chronic changes. Electronically Signed   By: Dorise Bullion III M.D   On: 04/01/2018 21:01   US Renal  Result Date: 04/02/2018 CLINICAL DATA:  Acute kidney injury EXAM: RENAL / URINARY TRACT ULTRASOUND COMPLETE COMPARISON:  CT 09/20/2010 FINDINGS: Right Kidney: Length: 12.1 cm.  Slightly echogenic cortex.  No hydronephrosis. Left Kidney: Length: 10.2 cm. Slightly echogenic cortex. No hydronephrosis. Cyst in the midpole measuring 2.5 x 2.7 x 2.7 cm. Cyst in the upper pole measuring 1.6 x 1 x 1.3 cm. Bladder: Appears normal for degree of bladder distention. Small amount of ascites in the pelvis IMPRESSION: 1. Negative for hydronephrosis. Slightly echogenic cortex suggesting medical renal disease 2. Cysts in the left kidney 3. Small amount of free fluid in the pelvis Electronically Signed   By: Donavan Foil  M.D.   On: 04/02/2018 00:37   Dg Chest Port 1 View  Result Date: 04/01/2018 CLINICAL DATA:  Cough and hypotension EXAM: PORTABLE CHEST 1 VIEW COMPARISON:  03/17/2018 FINDINGS: Moderate to large left pleural effusion. Atelectasis or pneumonia at the lingula and left base. Cardiomegaly with vascular calcification. No pneumothorax. IMPRESSION: Moderate to large left pleural effusion with atelectasis  or pneumonia at the lingula and left base. Electronically Signed   By: Donavan Foil M.D.   On: 04/01/2018 19:48    Cardiac Studies:  Assessment/Plan:  Left lower lobe pneumonia with large  pleural  effusion rule out empyema Marked leukocytosis secondary to above  septic shock Acute renal injury History of hypertension Hyperlipidemia Hypothyroidism Acute on chronic anemia probably secondary to hydration rule out GI loss History of bronchial asthma History of GI stromal tumor in the past History of GI bleed in the past Plan IV fluid 500 mL normal saline bolus Change IV antibiotics to vancomycin and Zosyn pending culture results Wean off levophed as blood pressure tolerates Transfer to ICU Check labs in a.m.  LOS: 1 day    [name] 04/02/2018, 9:07 AM

## 2018-04-02 NOTE — Progress Notes (Signed)
Pharmacy Antibiotic Note  Monique Lin is a 84 y.o. female admitted on 04/01/2018 with pneumonia.  Pharmacy has been consulted for Vancomycin / Zosyn dosing.  Ceftriaxone / Azithromycin discontinued  With AKI -> Scr = 2.92  Plan: Zosyn 2.25 grams iv Q 8 hours Vancomycin 1 gram iv Q 8 hours  Height: 5' 2.5" (158.8 cm) Weight: 170 lb (77.1 kg) IBW/kg (Calculated) : 51.25  Temp (24hrs), Avg:97.9 F (36.6 C), Min:97.9 F (36.6 C), Max:97.9 F (36.6 C)  Recent Labs  Lab 04/01/18 1928 04/01/18 1937 04/01/18 2347 04/02/18 0104 04/02/18 0155  WBC 25.9*  --  27.4*  --  28.2*  CREATININE 3.59* 3.50* 2.98*  --  2.92*  LATICACIDVEN  --  1.97*  --  0.94 0.9    Estimated Creatinine Clearance: 13 mL/min (A) (by C-G formula based on SCr of 2.92 mg/dL (H)).    Allergies  Allergen Reactions  . Codeine Other (See Comments)    Stomach cramps  . Penicillins Other (See Comments)    "Bad stomach cramps" Has patient had a PCN reaction causing immediate rash, facial/tongue/throat swelling, SOB or lightheadedness with hypotension: Unk Has patient had a PCN reaction causing severe rash involving mucus membranes or skin necrosis: Unk Has patient had a PCN reaction that required hospitalization: Unk Has patient had a PCN reaction occurring within the last 10 years: No If all of the above answers are "NO", then may proceed with Cephalosporin use.       Thank you Anette Guarneri, PharmD [phone] 04/02/2018 9:58 AM

## 2018-04-02 NOTE — ED Notes (Signed)
Patient placed on bedside commode.

## 2018-04-02 NOTE — ED Notes (Signed)
Pt given water and peanut butter crackers

## 2018-04-03 ENCOUNTER — Inpatient Hospital Stay (HOSPITAL_COMMUNITY): Payer: Medicare Other

## 2018-04-03 LAB — BASIC METABOLIC PANEL
ANION GAP: 11 (ref 5–15)
BUN: 32 mg/dL — AB (ref 6–20)
CALCIUM: 8.4 mg/dL — AB (ref 8.9–10.3)
CO2: 19 mmol/L — AB (ref 22–32)
CREATININE: 1.68 mg/dL — AB (ref 0.44–1.00)
Chloride: 109 mmol/L (ref 101–111)
GFR calc Af Amer: 30 mL/min — ABNORMAL LOW (ref >60)
GFR, EST NON AFRICAN AMERICAN: 26 mL/min — AB (ref >60)
GLUCOSE: 96 mg/dL (ref 65–99)
Potassium: 3.9 mmol/L (ref 3.5–5.1)
Sodium: 139 mmol/L (ref 135–145)

## 2018-04-03 LAB — PROCALCITONIN: Procalcitonin: 1.47 ng/mL

## 2018-04-03 LAB — CBC
HEMATOCRIT: 25.2 % — AB (ref 36.0–46.0)
Hemoglobin: 8.6 g/dL — ABNORMAL LOW (ref 12.0–15.0)
MCH: 31 pg (ref 26.0–34.0)
MCHC: 34.1 g/dL (ref 30.0–36.0)
MCV: 91 fL (ref 78.0–100.0)
PLATELETS: 268 10*3/uL (ref 150–400)
RBC: 2.77 MIL/uL — ABNORMAL LOW (ref 3.87–5.11)
RDW: 14.8 % (ref 11.5–15.5)
WBC: 22.4 10*3/uL — AB (ref 4.0–10.5)

## 2018-04-03 LAB — URINE CULTURE: Culture: NO GROWTH

## 2018-04-03 LAB — LEGIONELLA PNEUMOPHILA SEROGP 1 UR AG: L. pneumophila Serogp 1 Ur Ag: NEGATIVE

## 2018-04-03 MED ORDER — GUAIFENESIN ER 600 MG PO TB12
1200.0000 mg | ORAL_TABLET | Freq: Two times a day (BID) | ORAL | Status: DC
  Administered 2018-04-03 – 2018-04-25 (×43): 1200 mg via ORAL
  Filled 2018-04-03 (×44): qty 2

## 2018-04-03 MED ORDER — SODIUM CHLORIDE 3 % IN NEBU
4.0000 mL | INHALATION_SOLUTION | Freq: Two times a day (BID) | RESPIRATORY_TRACT | Status: AC
  Administered 2018-04-03 – 2018-04-05 (×6): 4 mL via RESPIRATORY_TRACT
  Filled 2018-04-03 (×6): qty 4

## 2018-04-03 NOTE — Progress Notes (Signed)
[facility]  Repeated ultrasound chest: only a very small pocket of fluid in the superior left upper lobe which is about 2cm in depth.  Not amenable to drainage.  There is a pocket of what appears to be fluid in the left lower lobe correlating where Dr. Pearline Cables performed a thoracentesis, but as we learned from that procedure and the CT scan there is no collection of fluid there to drain.  May need bronch in AM, will keep NPO, will follow up CXR in AM  Roselie Awkward, MD [place] [facility] Pager: [phone] Cell: [phone] After 3pm or if no response, call [phone]

## 2018-04-03 NOTE — Progress Notes (Signed)
[facility]  Brief:  84 yo female with  GIST tumor resected in 2011 admitted here on 4/29 here with dyspnea after bronchitis, found to have a left lower lobe pneumonia, possible pleural effusion.   Subjective: Quiet night Was off an on levophed last night  Past Medical History:  Diagnosis Date  . Bronchitis   . Hypertension   . Leg pain   . Stomach tumor (benign)      Vitals:   04/03/18 0600 04/03/18 0700 04/03/18 0800 04/03/18 0900  BP: (!) 113/54 (!) 114/52 (!) 123/56   Pulse: 80 82 84 81  Resp: 15 20 19 18   Temp:   98.4 F (36.9 C)   TempSrc:   Oral   SpO2: 97% 97% 97% 95%  Weight:      Height:       4 L [place]  General:  Resting comfortably in bed HENT: NCAT OP clear PULM: Rhonchi on R, diminished on left B, normal effort CV: RRR, no mgr GI: BS+, soft, nontender MSK: normal bulk and tone Neuro: awake, alert, no distress, MAEW    CBC    Component Value Date/Time   WBC 22.4 (H) 04/03/2018 0340   RBC 2.77 (L) 04/03/2018 0340   HGB 8.6 (L) 04/03/2018 0340   HCT 25.2 (L) 04/03/2018 0340   PLT 268 04/03/2018 0340   MCV 91.0 04/03/2018 0340   MCH 31.0 04/03/2018 0340   MCHC 34.1 04/03/2018 0340   RDW 14.8 04/03/2018 0340   LYMPHSABS 1.6 04/01/2018 1928   MONOABS 1.8 (H) 04/01/2018 1928   EOSABS 0.0 04/01/2018 1928   BASOSABS 0.0 04/01/2018 1928   BMET    Component Value Date/Time   NA 139 04/03/2018 0340   K 3.9 04/03/2018 0340   CL 109 04/03/2018 0340   CO2 19 (L) 04/03/2018 0340   GLUCOSE 96 04/03/2018 0340   BUN 32 (H) 04/03/2018 0340   CREATININE 1.68 (H) 04/03/2018 0340   CALCIUM 8.4 (L) 04/03/2018 0340   GFRNONAA 26 (L) 04/03/2018 0340   GFRAA 30 (L) 04/03/2018 0340   CT chest images reviewed: loculated effusion in the upper lobe on left, consolidation noted left lower lobe  CXR complete white out left lung  Cultures: 4/29 urine neg 4/29 Resp viral panel neg 4/29 blood > ngtd  Antibiotics Ceftriaxone 4/29 >  Azithro 4/29 >      Impression/plan:  Severe CAP with loculated pleural effusion: pleural fluid is in a difficult position to facilitate drainage without surgery (apex/upper lobe) and I don't think she is a good surgical candidate at age 74.   > will re-ultrasound her chest today > continue ceftriaxone/azithro  Likely mucus plugging LUL > add mucociliary clearance measures: hypertonic saline, chest PT, guaifenesin > repeat CXR in AM > consider bronchoscopy if no improvement and stable enough to tolerate that  Shock: resolved  AKI: better  Can eat  Roselie Awkward, MD [place] [facility] Pager: [phone] Cell: [phone] After 3pm or if no response, call [phone]

## 2018-04-03 NOTE — Progress Notes (Signed)
Subjective:  More alert and awake.  Patient is awfully low fat renal function improving.  Chest x-ray this a.m. Showed complete white out of left lung  Objective:  Vital Signs in the last 24 hours: Temp:  [98.2 F (36.8 C)-99.1 F (37.3 C)] 98.4 F (36.9 C) (05/01 0800) Pulse Rate:  [46-90] 81 (05/01 0900) Resp:  [14-29] 18 (05/01 0900) BP: (83-124)/(44-91) 123/56 (05/01 0800) SpO2:  [88 %-99 %] 95 % (05/01 0900) Weight:  [77.1 kg (170 lb)-80.4 kg (177 lb 4 oz)] 80.4 kg (177 lb 4 oz) (05/01 0500)  Intake/Output from previous day: 04/30 0701 - 05/01 0700 In: 3174.6 [I.V.:2824.6; IV Piggyback:350] Out: 475 [Urine:475] Intake/Output from this shift: Total I/O In: 250 [I.V.:250] Out: -   Physical Exam: Neck: no adenopathy, no carotid bruit, no JVD and supple, symmetrical, trachea midline Lungs: clear to auscultation.  Right lung field.  Left lung decreased breath sounds and rhonchi noted Heart: regular rate and rhythm, S1, S2 normal and soft systolic murmur noted Abdomen: soft, non-tender; bowel sounds normal; no masses,  no organomegaly Extremities: extremities normal, atraumatic, no cyanosis or edema  Lab Results: Recent Labs    04/02/18 0155 04/03/18 0340  WBC 28.2* 22.4*  HGB 8.3* 8.6*  PLT 238 268   Recent Labs    04/02/18 0155 04/03/18 0340  NA 136 139  K 3.9 3.9  CL 107 109  CO2 20* 19*  GLUCOSE 158* 96  BUN 46* 32*  CREATININE 2.92* 1.68*   No results for input(s): TROPONINI in the last 72 hours.  Invalid input(s): CK, MB Hepatic Function Panel Recent Labs    04/01/18 1928  PROT 6.9  ALBUMIN 2.3*  AST 29  ALT 16  ALKPHOS 106  BILITOT 1.5*   No results for input(s): CHOL in the last 72 hours. No results for input(s): PROTIME in the last 72 hours.  Imaging: Imaging results have been reviewed and Ct Head Wo Contrast  Result Date: 04/01/2018 CLINICAL DATA:  Increased weakness.  Leaning to the left. EXAM: CT HEAD WITHOUT CONTRAST TECHNIQUE:  Contiguous axial images were obtained from the base of the skull through the vertex without intravenous contrast. COMPARISON:  December 22, 2012 FINDINGS: Brain: No subdural, epidural, or subarachnoid hemorrhage. Cerebellum, brainstem, and basal cisterns are normal. Ventricles and sulci are unchanged. White matter changes are identified. No acute cortical ischemia or infarct. No mass effect or midline shift. Vascular: Calcified atherosclerosis in the intracranial carotids. Skull: Normal. Negative for fracture or focal lesion. Sinuses/Orbits: Fluid and mucosal thickening in the paranasal sinuses is increased since 2014. Mastoid air cells and middle ears are normal. Other: None. IMPRESSION: 1. Significant sinus disease as above. 2. No acute intracranial abnormality.  Chronic changes. Electronically Signed   By: Dorise Bullion III M.D   On: 04/01/2018 21:01   Ct Chest Wo Contrast  Result Date: 04/02/2018 CLINICAL DATA:  Shortness of breath. Attempted thoracentesis with inability to remove any fluid. EXAM: CT CHEST WITHOUT CONTRAST TECHNIQUE: Multidetector CT imaging of the chest was performed following the standard protocol without IV contrast. COMPARISON:  Chest x-ray from yesterday. FINDINGS: Cardiovascular: Normal heart size. No pericardial effusion. Normal caliber thoracic aorta. Coronary, aortic arch, and branch vessel atherosclerotic vascular disease. Mediastinum/Nodes: No enlarged mediastinal or axillary lymph nodes. Thyroid gland, trachea, and esophagus demonstrate no significant findings. Lungs/Pleura: Moderate loculated left-sided pleural effusion with fluid extending into the major fissure. There is volume loss in the left hemithorax with narrowing of the lower lobe bronchi  and near complete collapse of the left lower lobe. Patchy consolidation in the lingula. No pneumothorax. Trace right pleural effusion with mild right lower lobe atelectasis. Upper Abdomen: No acute abnormality. Prior partial  gastrectomy. Stable simple cyst in the left liver. Musculoskeletal: No acute or significant osseous finding. IMPRESSION: 1. Volume loss in the left hemithorax with narrowing of the lower lobe bronchi and near complete collapse of the left lower lobe. There is likely a component of underlying pneumonia within the left lower lobe as well. Patchy pneumonia within the lingula. An obstructing central hilar mass is not excluded. Consider bronchoscopy for further evaluation. Follow-up to resolution is recommended. 2. Moderate loculated left-sided pleural effusion. Empyema is not excluded. 3. Trace right pleural effusion with adjacent lower lobe atelectasis. 4.  Aortic atherosclerosis (ICD10-I70.0). Electronically Signed   By: Titus Dubin M.D.   On: 04/02/2018 16:55   US Renal  Result Date: 04/02/2018 CLINICAL DATA:  Acute kidney injury EXAM: RENAL / URINARY TRACT ULTRASOUND COMPLETE COMPARISON:  CT 09/20/2010 FINDINGS: Right Kidney: Length: 12.1 cm.  Slightly echogenic cortex.  No hydronephrosis. Left Kidney: Length: 10.2 cm. Slightly echogenic cortex. No hydronephrosis. Cyst in the midpole measuring 2.5 x 2.7 x 2.7 cm. Cyst in the upper pole measuring 1.6 x 1 x 1.3 cm. Bladder: Appears normal for degree of bladder distention. Small amount of ascites in the pelvis IMPRESSION: 1. Negative for hydronephrosis. Slightly echogenic cortex suggesting medical renal disease 2. Cysts in the left kidney 3. Small amount of free fluid in the pelvis Electronically Signed   By: Donavan Foil M.D.   On: 04/02/2018 00:37   Dg Chest Port 1 View  Result Date: 04/01/2018 CLINICAL DATA:  Cough and hypotension EXAM: PORTABLE CHEST 1 VIEW COMPARISON:  03/17/2018 FINDINGS: Moderate to large left pleural effusion. Atelectasis or pneumonia at the lingula and left base. Cardiomegaly with vascular calcification. No pneumothorax. IMPRESSION: Moderate to large left pleural effusion with atelectasis or pneumonia at the lingula and left base.  Electronically Signed   By: Donavan Foil M.D.   On: 04/01/2018 19:48    Cardiac Studies:  Assessment/Plan:  Left lung pneumonia with complete collapse of the left lung, rule out endobronchial mass/mucous plugging/empyema, status post thoracentesis Marked leukocytosis secondary to above. Status post septic shock. Resolving acute renal injury History of hypertension Hyperlipidemia Hypothyroidism Acute on chronic anemia probably secondary to hydration rule out GI loss History of bronchial asthma History of GI stromal tumor in the past History of GI bleed in the past Plan Continue present management. Bronchoscopy/Further management per CCM Check labs in a.m. Check cultures  LOS: 2 days    [name] 04/03/2018, 9:14 AM

## 2018-04-04 DIAGNOSIS — R0600 Dyspnea, unspecified: Secondary | ICD-10-CM

## 2018-04-04 LAB — BASIC METABOLIC PANEL
Anion gap: 9 (ref 5–15)
Anion gap: 11 (ref 5–15)
BUN: 22 mg/dL — ABNORMAL HIGH (ref 6–20)
BUN: 21 mg/dL — AB (ref 6–20)
CALCIUM: 8.4 mg/dL — AB (ref 8.9–10.3)
CHLORIDE: 113 mmol/L — AB (ref 101–111)
CO2: 18 mmol/L — AB (ref 22–32)
CO2: 19 mmol/L — ABNORMAL LOW (ref 22–32)
CREATININE: 1.17 mg/dL — AB (ref 0.44–1.00)
Calcium: 8.8 mg/dL — ABNORMAL LOW (ref 8.9–10.3)
Chloride: 110 mmol/L (ref 101–111)
Creatinine, Ser: 1.22 mg/dL — ABNORMAL HIGH (ref 0.44–1.00)
GFR calc Af Amer: 44 mL/min — ABNORMAL LOW (ref >60)
GFR calc non Af Amer: 40 mL/min — ABNORMAL LOW (ref >60)
GFR, EST AFRICAN AMERICAN: 47 mL/min — AB (ref >60)
GFR, EST NON AFRICAN AMERICAN: 38 mL/min — AB (ref >60)
GLUCOSE: 110 mg/dL — AB (ref 65–99)
GLUCOSE: 97 mg/dL (ref 65–99)
Potassium: 3.9 mmol/L (ref 3.5–5.1)
Potassium: 4.3 mmol/L (ref 3.5–5.1)
SODIUM: 140 mmol/L (ref 135–145)
Sodium: 140 mmol/L (ref 135–145)

## 2018-04-04 LAB — CBC
HCT: 26.6 % — ABNORMAL LOW (ref 36.0–46.0)
HEMATOCRIT: 29.1 % — AB (ref 36.0–46.0)
Hemoglobin: 8.7 g/dL — ABNORMAL LOW (ref 12.0–15.0)
Hemoglobin: 9.6 g/dL — ABNORMAL LOW (ref 12.0–15.0)
MCH: 29.4 pg (ref 26.0–34.0)
MCH: 30.2 pg (ref 26.0–34.0)
MCHC: 32.7 g/dL (ref 30.0–36.0)
MCHC: 33 g/dL (ref 30.0–36.0)
MCV: 89.9 fL (ref 78.0–100.0)
MCV: 91.5 fL (ref 78.0–100.0)
PLATELETS: 309 10*3/uL (ref 150–400)
Platelets: 327 10*3/uL (ref 150–400)
RBC: 2.96 MIL/uL — AB (ref 3.87–5.11)
RBC: 3.18 MIL/uL — ABNORMAL LOW (ref 3.87–5.11)
RDW: 14.6 % (ref 11.5–15.5)
RDW: 14.8 % (ref 11.5–15.5)
WBC: 21.8 10*3/uL — ABNORMAL HIGH (ref 4.0–10.5)
WBC: 23.5 10*3/uL — AB (ref 4.0–10.5)

## 2018-04-04 LAB — ADENOVIRUS ANTIBODIES: Adenovirus Antibody: NEGATIVE

## 2018-04-04 LAB — PROCALCITONIN: Procalcitonin: 0.84 ng/mL

## 2018-04-04 MED ORDER — MIDAZOLAM HCL 2 MG/2ML IJ SOLN
INTRAMUSCULAR | Status: AC
  Administered 2018-04-04: 2 mg
  Filled 2018-04-04: qty 4

## 2018-04-04 MED ORDER — METOPROLOL TARTRATE 12.5 MG HALF TABLET
12.5000 mg | ORAL_TABLET | Freq: Two times a day (BID) | ORAL | Status: DC
  Administered 2018-04-04 – 2018-04-25 (×40): 12.5 mg via ORAL
  Filled 2018-04-04 (×40): qty 1

## 2018-04-04 MED ORDER — DEXTROSE 50 % IV SOLN
INTRAVENOUS | Status: AC
  Filled 2018-04-04: qty 50

## 2018-04-04 MED ORDER — FENTANYL CITRATE (PF) 100 MCG/2ML IJ SOLN
INTRAMUSCULAR | Status: AC
  Administered 2018-04-04: 50 ug
  Filled 2018-04-04: qty 2

## 2018-04-04 NOTE — Progress Notes (Signed)
Subjective:  Patient denies any chest pain states breathing slightly improved. Renal function back to baseline. Repeat chest x-ray showed improved aeration in left upper lobe  Objective:  Vital Signs in the last 24 hours: Temp:  [98.5 F (36.9 C)-99 F (37.2 C)] 98.8 F (37.1 C) (05/02 0800) Pulse Rate:  [68-96] 95 (05/02 0800) Resp:  [11-28] 14 (05/02 0800) BP: (101-156)/(43-98) 148/76 (05/02 0800) SpO2:  [79 %-100 %] 87 % (05/02 0909) Weight:  [80.9 kg (178 lb 5.6 oz)] 80.9 kg (178 lb 5.6 oz) (05/02 0500)  Intake/Output from previous day: 05/01 0701 - 05/02 0700 In: 3350 [I.V.:3000; IV Piggyback:350] Out: 725 [Urine:725] Intake/Output from this shift: Total I/O In: 250 [I.V.:250] Out: -   Physical Exam: Neck: no adenopathy, no carotid bruit, no JVD and supple, symmetrical, trachea midline Lungs: Absent breath sounds left lower lobe with occasional rhonchi. Heart: regular rate and rhythm, S1, S2 normal and Soft systolic murmur noted Abdomen: soft, non-tender; bowel sounds normal; no masses,  no organomegaly Extremities: No clubbing cyanosis trace edema noted  Lab Results: Recent Labs    04/03/18 0340 04/04/18 0336  WBC 22.4* 21.8*  HGB 8.6* 8.7*  PLT 268 309   Recent Labs    04/03/18 0340 04/04/18 0336  NA 139 140  K 3.9 3.9  CL 109 113*  CO2 19* 18*  GLUCOSE 96 110*  BUN 32* 22*  CREATININE 1.68* 1.17*   No results for input(s): TROPONINI in the last 72 hours.  Invalid input(s): CK, MB Hepatic Function Panel Recent Labs    04/01/18 1928  PROT 6.9  ALBUMIN 2.3*  AST 29  ALT 16  ALKPHOS 106  BILITOT 1.5*   No results for input(s): CHOL in the last 72 hours. No results for input(s): PROTIME in the last 72 hours.  Imaging: Imaging results have been reviewed and Ct Chest Wo Contrast  Result Date: 04/02/2018 CLINICAL DATA:  Shortness of breath. Attempted thoracentesis with inability to remove any fluid. EXAM: CT CHEST WITHOUT CONTRAST TECHNIQUE:  Multidetector CT imaging of the chest was performed following the standard protocol without IV contrast. COMPARISON:  Chest x-ray from yesterday. FINDINGS: Cardiovascular: Normal heart size. No pericardial effusion. Normal caliber thoracic aorta. Coronary, aortic arch, and branch vessel atherosclerotic vascular disease. Mediastinum/Nodes: No enlarged mediastinal or axillary lymph nodes. Thyroid gland, trachea, and esophagus demonstrate no significant findings. Lungs/Pleura: Moderate loculated left-sided pleural effusion with fluid extending into the major fissure. There is volume loss in the left hemithorax with narrowing of the lower lobe bronchi and near complete collapse of the left lower lobe. Patchy consolidation in the lingula. No pneumothorax. Trace right pleural effusion with mild right lower lobe atelectasis. Upper Abdomen: No acute abnormality. Prior partial gastrectomy. Stable simple cyst in the left liver. Musculoskeletal: No acute or significant osseous finding. IMPRESSION: 1. Volume loss in the left hemithorax with narrowing of the lower lobe bronchi and near complete collapse of the left lower lobe. There is likely a component of underlying pneumonia within the left lower lobe as well. Patchy pneumonia within the lingula. An obstructing central hilar mass is not excluded. Consider bronchoscopy for further evaluation. Follow-up to resolution is recommended. 2. Moderate loculated left-sided pleural effusion. Empyema is not excluded. 3. Trace right pleural effusion with adjacent lower lobe atelectasis. 4.  Aortic atherosclerosis (ICD10-I70.0). Electronically Signed   By: Titus Dubin M.D.   On: 04/02/2018 16:55   Dg Chest Port 1 View  Result Date: 04/03/2018 CLINICAL DATA:  Respiratory failure  EXAM: PORTABLE CHEST 1 VIEW COMPARISON:  04/03/2018 FINDINGS: Moderate to large left pleural effusion. Increased aeration of the left upper hemithorax. Right lung is clear. No pneumothorax. The heart is  normal in size. Degenerative changes of the thoracic spine. IMPRESSION: Increased aeration of the left upper hemithorax. Moderate to large left pleural effusion. Electronically Signed   By: Julian Hy M.D.   On: 04/03/2018 18:29   Dg Chest Port 1 View  Result Date: 04/03/2018 CLINICAL DATA:  Community-acquired pneumonia EXAM: PORTABLE CHEST 1 VIEW COMPARISON:  CT scan chest of April 02, 2018 and chest x-ray of April 01, 2018 FINDINGS: There is now total opacification of the left hemithorax. There is no significant mediastinal shift. The right lung is well-expanded. There is hazy increased density at the right base however. The heart borders are obscured. The pulmonary vascularity is not engorged. There is calcification in the wall of the aortic arch. IMPRESSION: Complete opacification of the left hemithorax consistent with further lung atelectasis and pleural fluid accumulation. Right basilar atelectasis or pneumonia. Thoracic aortic atherosclerosis. Electronically Signed   By: David  Martinique M.D.   On: 04/03/2018 09:43    Cardiac Studies:  Assessment/Plan:    LOS: 3 days  Left lung pneumonia with complete collapse of the left lung, rule out endobronchial mass/mucous plugging/empyema, status post thoracentesis Marked leukocytosis secondary to above. Status post septic shock. Status post acute renal injury History of hypertension Hyperlipidemia Hypothyroidism Acute on chronic anemia probably secondary to hydration rule out GI loss History of bronchial asthma History of GI stromal tumor in the past History of GI bleed in the past Plan Continue present management Decrease IV fluid to 50 mL per hour Restart low-dose beta blockers as per orders Scheduled for possible bronchoscopy today Check labs in a.m.   [name] 04/04/2018, 9:27 AM

## 2018-04-04 NOTE — Progress Notes (Signed)
[facility]  Bronch showed bronchomalacia which means that the airway collapses easily making her susceptible to pneumonia in the left lung. No airway lesion or mass, no secretions noted.  Roselie Awkward, MD [place] [facility] Pager: [phone] Cell: [phone] After 3pm or if no response, call [phone]

## 2018-04-04 NOTE — Progress Notes (Signed)
[facility]  S: felt a little better this mornign but since 10 AM has had more labored breathing and cough with congestion Coughing up a lot of mucus today Ultrasound chest 5/1 > didn't show significant fluid collection  O:  Vitals:   04/04/18 0800 04/04/18 0900 04/04/18 0909 04/04/18 1000  BP: (!) 148/76 133/73  140/85  Pulse: 95 92  89  Resp: 14 19  (!) 22  Temp: 98.8 F (37.1 C)     TempSrc: Oral     SpO2: (!) 79% 100% (!) 87% 98%  Weight:      Height:       4L [place]  General:  Resting comfortably in bed HENT: NCAT OP clear PULM: Diminished left base, normal effort CV: RRR, no mgr GI: BS+, soft, nontender MSK: normal bulk and tone Neuro: awake, alert, no distress, MAEW   CXR images reviewed: left lower lobe infiltrate vs effusion  CBC    Component Value Date/Time   WBC 21.8 (H) 04/04/2018 0336   RBC 2.96 (L) 04/04/2018 0336   HGB 8.7 (L) 04/04/2018 0336   HCT 26.6 (L) 04/04/2018 0336   PLT 309 04/04/2018 0336   MCV 89.9 04/04/2018 0336   MCH 29.4 04/04/2018 0336   MCHC 32.7 04/04/2018 0336   RDW 14.6 04/04/2018 0336   LYMPHSABS 1.6 04/01/2018 1928   MONOABS 1.8 (H) 04/01/2018 1928   EOSABS 0.0 04/01/2018 1928   BASOSABS 0.0 04/01/2018 1928   BMET    Component Value Date/Time   NA 140 04/04/2018 0336   K 3.9 04/04/2018 0336   CL 113 (H) 04/04/2018 0336   CO2 18 (L) 04/04/2018 0336   GLUCOSE 110 (H) 04/04/2018 0336   BUN 22 (H) 04/04/2018 0336   CREATININE 1.17 (H) 04/04/2018 0336   CALCIUM 8.4 (L) 04/04/2018 0336   GFRNONAA 40 (L) 04/04/2018 0336   GFRAA 47 (L) 04/04/2018 0336    Impression/plan:  Left lower lobe community acquired pneumonia: > continue ceftriaxone and azithromycin for 7 days  Chest congestion/left upper lobe atelectasis: > better on CXR but the presence of this raises suspicion for a left mainstem mass or bronchomalacia (could just be mucus plugging) > will perform a bronchoscopy today if her dyspnea improves  Roselie Awkward,  MD [place] [facility] Pager: [phone] Cell: [phone] After 3pm or if no response, call [phone]

## 2018-04-04 NOTE — Procedures (Signed)
PCCM Video Bronchoscopy Procedure Note  The patient was informed of the risks (including but not limited to bleeding, infection, respiratory failure, lung injury, tooth/oral injury) and benefits of the procedure and gave consent, see chart.  Indication: Airway collapse/left lung obstruction  Post Procedure Diagnosis: tracheobronchomalacia   Location: [facility] ICU  Condition pre procedure: stable, in ICU  Medications for procedure: versed 1mg  IV, Fentanyl 41mcg IV   Procedure description: The bronchoscope was introduced through the mouth,  and passed to the bilateral lungs to the level of the subsegmental bronchi throughout the tracheobronchial tree.  Airway exam revealed normal appearing and functioning vocal cords, normal trachea proximally but by the distal trachea/carina there was significant tracheomalacia.  The left mainstem partially collapsed with respiration and the left upper lobe and left lower lobe orifice had complete collapse with respiration but the scope could be passed into the lumen of both lobes and inspection showed no airway lesion or mass.  There was actually scant mucus seen there.  The right tracheobronchial tree was also inspected and was free of airway lesion or mass.    Procedures performed: none  Specimens sent: none  Condition post procedure: stable, in ICU  EBL: none from procedure  Complications: none immediate  Roselie Awkward, MD [place] PCCM Pager: [phone] Cell: [phone] After 3pm or if no response, call [phone]

## 2018-04-04 NOTE — Progress Notes (Signed)
[place] Progress Note Patient Name: Monique Lin DOB: 12-Jan-1927 MRN: [id number]   Date of Service  04/04/2018  HPI/Events of Note  Needs diet order.  eICU Interventions  Cardiac diet order        Frederik Pear 04/04/2018, 11:52 PM

## 2018-04-05 ENCOUNTER — Inpatient Hospital Stay (HOSPITAL_COMMUNITY): Payer: Medicare Other

## 2018-04-05 ENCOUNTER — Encounter (HOSPITAL_COMMUNITY): Payer: Self-pay | Admitting: Interventional Radiology

## 2018-04-05 HISTORY — PX: IR THORACENTESIS ASP PLEURAL SPACE W/IMG GUIDE: IMG5380

## 2018-04-05 LAB — BODY FLUID CELL COUNT WITH DIFFERENTIAL
Eos, Fluid: 0 %
Lymphs, Fluid: 1 %
MONOCYTE-MACROPHAGE-SEROUS FLUID: 0 % — AB (ref 50–90)
Neutrophil Count, Fluid: 99 % — ABNORMAL HIGH (ref 0–25)
Total Nucleated Cell Count, Fluid: 1140 cu mm — ABNORMAL HIGH (ref 0–1000)

## 2018-04-05 LAB — LACTATE DEHYDROGENASE, PLEURAL OR PERITONEAL FLUID: LD FL: 678 U/L — AB (ref 3–23)

## 2018-04-05 LAB — PROTEIN, PLEURAL OR PERITONEAL FLUID: Total protein, fluid: 3.7 g/dL

## 2018-04-05 LAB — GLUCOSE, PLEURAL OR PERITONEAL FLUID

## 2018-04-05 LAB — BORDETELLA PERTUSSIS PCR
B PARAPERTUSSIS, DNA: NEGATIVE
B PERTUSSIS, DNA: NEGATIVE

## 2018-04-05 MED ORDER — LIDOCAINE HCL (PF) 2 % IJ SOLN
INTRAMUSCULAR | Status: AC
  Filled 2018-04-05: qty 20

## 2018-04-05 MED ORDER — LIDOCAINE HCL (PF) 1 % IJ SOLN
INTRAMUSCULAR | Status: AC
  Filled 2018-04-05: qty 30

## 2018-04-05 MED ORDER — LIDOCAINE HCL (PF) 1 % IJ SOLN
INTRAMUSCULAR | Status: AC | PRN
  Administered 2018-04-05: 5 mL

## 2018-04-05 MED ORDER — ENOXAPARIN SODIUM 40 MG/0.4ML ~~LOC~~ SOLN
40.0000 mg | SUBCUTANEOUS | Status: DC
  Administered 2018-04-05: 40 mg via SUBCUTANEOUS
  Filled 2018-04-05 (×2): qty 0.4

## 2018-04-05 NOTE — Progress Notes (Signed)
[facility] PCCM  Bedside ultrasound: I cannot appreciate a distinct pocket of fluid in the left lung but have asked IR to evaluate her left chest as well for possible drain.  There appears to be notable consolidation and perhaps small pockets of fluid in the left lung.  I'd like IR to take a look as well with their equipment as perhaps they will see something I cannot.  Roselie Awkward, MD [place] PCCM Pager: [phone] Cell: [phone] After 3pm or if no response, call [phone]

## 2018-04-05 NOTE — Progress Notes (Signed)
CPT not done at this time due to the pt being off the unit.

## 2018-04-05 NOTE — Progress Notes (Signed)
Subjective:  Appreciated CCM consult and help.  Bronchoscopy results noted Patient denies any chest pain.  Complains of pleuritic left-sided pain.  States overall feels better.  Objective:  Vital Signs in the last 24 hours: Temp:  [97.8 F (36.6 C)-99.5 F (37.5 C)] 98.9 F (37.2 C) (05/03 0900) Pulse Rate:  [77-99] 81 (05/03 1000) Resp:  [15-27] 20 (05/03 1000) BP: (100-157)/(47-84) 134/69 (05/03 1000) SpO2:  [93 %-100 %] 100 % (05/03 1000) Weight:  [80.3 kg (177 lb 0.5 oz)] 80.3 kg (177 lb 0.5 oz) (05/03 0500)  Intake/Output from previous day: 05/02 0701 - 05/03 0700 In: 1900 [I.V.:1550; IV Piggyback:350] Out: 5852 [Urine:1125] Intake/Output from this shift: Total I/O In: 100 [I.V.:100] Out: -   Physical Exam: Neck: no adenopathy, no carotid bruit, no JVD and supple, symmetrical, trachea midline Lungs: absent breath sounds left lower lobe  Occasional rhonchi.  Decreased breath sounds at right base Heart: regular rate and rhythm, S1, S2 normal and soft systolic murmur noted Abdomen: soft, non-tender; bowel sounds normal; no masses,  no organomegaly Extremities: extremities normal, atraumatic, no cyanosis or edema  Lab Results: Recent Labs    04/04/18 0336 04/04/18 1022  WBC 21.8* 23.5*  HGB 8.7* 9.6*  PLT 309 327   Recent Labs    04/04/18 0336 04/04/18 1022  NA 140 140  K 3.9 4.3  CL 113* 110  CO2 18* 19*  GLUCOSE 110* 97  BUN 22* 21*  CREATININE 1.17* 1.22*   No results for input(s): TROPONINI in the last 72 hours.  Invalid input(s): CK, MB Hepatic Function Panel No results for input(s): PROT, ALBUMIN, AST, ALT, ALKPHOS, BILITOT, BILIDIR, IBILI in the last 72 hours. No results for input(s): CHOL in the last 72 hours. No results for input(s): PROTIME in the last 72 hours.  Imaging: Imaging results have been reviewed and Dg Chest Port 1 View  Result Date: 04/03/2018 CLINICAL DATA:  Respiratory failure EXAM: PORTABLE CHEST 1 VIEW COMPARISON:  04/03/2018  FINDINGS: Moderate to large left pleural effusion. Increased aeration of the left upper hemithorax. Right lung is clear. No pneumothorax. The heart is normal in size. Degenerative changes of the thoracic spine. IMPRESSION: Increased aeration of the left upper hemithorax. Moderate to large left pleural effusion. Electronically Signed   By: Julian Hy M.D.   On: 04/03/2018 18:29   Dg Chest Port 1v Same Day  Result Date: 04/05/2018 CLINICAL DATA:  Pneumonia. EXAM: PORTABLE CHEST 1 VIEW COMPARISON:  Radiograph of Apr 03, 2018. FINDINGS: No pneumothorax is noted. Atherosclerosis of thoracic aorta is noted. Mild right basilar subsegmental atelectasis is noted. Large left pleural effusion is again noted and increased compared to prior exam, with significantly decreased aeration of left upper lobe compared to prior exam. Bony thorax is unremarkable. IMPRESSION: Increased left pleural effusion is noted compared to prior exam, with significantly decreased aeration of left upper lobe compared to prior exam as well. This may be due to pneumonia or atelectasis. Mild right basilar subsegmental atelectasis is noted. Electronically Signed   By: Marijo Conception, M.D.   On: 04/05/2018 07:20    Cardiac Studies:  Assessment/Plan:  Left lung pneumonia with complete collapse of lung, rule out empyema.  Status post bronchial noted to have bronchomalacia. Chronic leukocytosis secondary to above Status post septic shock. Status post acute renal injury History of hypertension Hyperlipidemia Hypothyroidism Acute on chronic anemia probably secondary to hydration rule out GI loss History of bronchial asthma History of GI stromal tumor in the  past History of GI bleed in the past Plan Discussed with CCM regarding CT-guided thoracentesis versus CVTS consult for possible VATS procedure. CCM.  plan to repeat ultrasound to see if fluid can be localized and drained Dr.Kadakia on call for me for weekend.   LOS: 4 days     [name] 04/05/2018, 11:09 AM

## 2018-04-05 NOTE — Care Management Note (Signed)
Case Management Note  Patient Details  Name: [name] MRN: [id number] Date of Birth: 1927-11-20  Subjective/Objective:    Pt admitted with lower lobe PNA                 Action/Plan:   PTA independent from home with daughter.  Pt was able to perform ADLS and only used a cane occasionally.  Pt had second attempt for paracentesis today.  Per Attending; pt is now appropriate for PT eval - eval ordered.  SNF if recommended.    Expected Discharge Date:                  Expected Discharge Plan:  [place]  In-House Referral:     Discharge planning Services  CM Consult  Post Acute Care Choice:    Choice offered to:     DME Arranged:    DME Agency:     HH Arranged:    [place] Agency:     Status of Service:  In process, will continue to follow  If discussed at Long Length of Stay Meetings, dates discussed:    Additional Comments:  Maryclare Labrador, RN 04/05/2018, 3:48 PM

## 2018-04-05 NOTE — Progress Notes (Signed)
[facility] PCCM  S: CXR worse but she feels better Bronch yesterday: bronchomalacia  O:  Vitals:   04/05/18 0806 04/05/18 0829 04/05/18 0900 04/05/18 0901  BP:   (!) 143/79 (!) 143/79  Pulse:   79 87  Resp:   (!) 22   Temp: 99.5 F (37.5 C)  98.9 F (37.2 C)   TempSrc: Axillary  Oral   SpO2:  98% 99%   Weight:      Height:       4L [place]  General:  Resting comfortably in bed HENT: NCAT OP clear PULM: CTA B, normal effort CV: RRR, no mgr GI: BS+, soft, nontender MSK: normal bulk and tone Neuro: awake, alert, no distress, MAEW  CXR from 5/3 reviewed: white out left lung, some atelectasis and increased pleural effusion  Impression/PLan  Left lower lobe pneumonia: continue ceftriaxone and azithro Bronchomalacia: out of bed, pulmonary toilet issues with hypertonic saline, bronchodilators Left pleural effusion: may be bigger on today's CXR will ultrasound chest to see if there is drainable fluid  Roselie Awkward, MD [place] PCCM Pager: [phone] Cell: [phone] After 3pm or if no response, call [phone]

## 2018-04-05 NOTE — Procedures (Addendum)
Interventional Radiology Procedure Note  Procedure: US guided left thoracentesis  Complications: None  Estimated Blood Loss: None  Findings: Posterior loculation of complex left pleural effusion yielded initially more clear yellow fluid which turned more overtly cloudy/purulent towards end of aspiration.  Via 6 Fr catheter, only 90 mL of total fluid able to be drained from the posterior pocket.  Samples sent for lab analysis.  Venetia Night. Kathlene Cote, M.D Pager:  [phone]

## 2018-04-06 ENCOUNTER — Inpatient Hospital Stay (HOSPITAL_COMMUNITY): Payer: Medicare Other

## 2018-04-06 DIAGNOSIS — J869 Pyothorax without fistula: Secondary | ICD-10-CM

## 2018-04-06 LAB — BASIC METABOLIC PANEL
Anion gap: 12 (ref 5–15)
BUN: 13 mg/dL (ref 6–20)
CALCIUM: 9.1 mg/dL (ref 8.9–10.3)
CO2: 17 mmol/L — AB (ref 22–32)
CREATININE: 1.01 mg/dL — AB (ref 0.44–1.00)
Chloride: 111 mmol/L (ref 101–111)
GFR calc Af Amer: 56 mL/min — ABNORMAL LOW (ref >60)
GFR, EST NON AFRICAN AMERICAN: 48 mL/min — AB (ref >60)
GLUCOSE: 135 mg/dL — AB (ref 65–99)
Potassium: 3.6 mmol/L (ref 3.5–5.1)
Sodium: 140 mmol/L (ref 135–145)

## 2018-04-06 LAB — CULTURE, BLOOD (ROUTINE X 2)
CULTURE: NO GROWTH
Culture: NO GROWTH
SPECIAL REQUESTS: ADEQUATE
Special Requests: ADEQUATE

## 2018-04-06 MED ORDER — SODIUM CHLORIDE 0.9 % IV SOLN
2.0000 g | INTRAVENOUS | Status: DC
  Administered 2018-04-06 – 2018-04-08 (×3): 2 g via INTRAVENOUS
  Filled 2018-04-06 (×5): qty 20

## 2018-04-06 MED ORDER — ACETAMINOPHEN 325 MG PO TABS
650.0000 mg | ORAL_TABLET | Freq: Four times a day (QID) | ORAL | Status: DC | PRN
  Administered 2018-04-06 – 2018-04-24 (×32): 650 mg via ORAL
  Filled 2018-04-06 (×34): qty 2

## 2018-04-06 MED ORDER — ENOXAPARIN SODIUM 40 MG/0.4ML ~~LOC~~ SOLN
40.0000 mg | SUBCUTANEOUS | Status: DC
  Administered 2018-04-08 – 2018-04-12 (×5): 40 mg via SUBCUTANEOUS
  Filled 2018-04-06 (×5): qty 0.4

## 2018-04-06 MED ORDER — IOPAMIDOL (ISOVUE-300) INJECTION 61%
75.0000 mL | Freq: Once | INTRAVENOUS | Status: AC | PRN
  Administered 2018-04-06: 75 mL via INTRAVENOUS

## 2018-04-06 MED ORDER — FUROSEMIDE 10 MG/ML IJ SOLN
40.0000 mg | Freq: Once | INTRAMUSCULAR | Status: DC
  Filled 2018-04-06: qty 4

## 2018-04-06 MED ORDER — ENSURE ENLIVE PO LIQD
237.0000 mL | Freq: Two times a day (BID) | ORAL | Status: DC
  Administered 2018-04-06 – 2018-04-11 (×6): 237 mL via ORAL

## 2018-04-06 NOTE — Progress Notes (Signed)
Ref: [name], MD   Subjective:  Gm. Negative rods on pleural fluid. Awaiting CT chest for possible smaller drain by interventional radiologist followed by TPA/Pulmozyme per CCM. Bronchomalacia on bronchoscopy.  Objective:  Vital Signs in the last 24 hours: Temp:  [97.9 F (36.6 C)-99.2 F (37.3 C)] 97.9 F (36.6 C) (05/04 0834) Pulse Rate:  [77-97] 96 (05/04 1054) Cardiac Rhythm: Normal sinus rhythm (05/04 0800) Resp:  [17-26] 19 (05/04 0935) BP: (81-166)/(56-92) 166/70 (05/04 1054) SpO2:  [96 %-100 %] 97 % (05/04 0953) FiO2 (%):  [28 %] 28 % (05/03 1226) Weight:  [82.4 kg (181 lb 10.5 oz)] 82.4 kg (181 lb 10.5 oz) (05/04 0500)  Physical Exam: BP Readings from Last 1 Encounters:  04/06/18 (!) 166/70     Wt Readings from Last 1 Encounters:  04/06/18 82.4 kg (181 lb 10.5 oz)    Weight change: 2.1 kg (4 lb 10.1 oz) Body mass index is 32.7 kg/m. HEENT: [place]/AT, Eyes-Brown, Conjunctiva-Pink, Sclera-Non-icteric Neck: No JVD, No bruit, Trachea midline. Lungs:  Clear anteriorly, Bilateral. Decreased breath sounds at bases. Cardiac:  Regular rhythm, normal S1 and S2, no S3. II/VI systolic murmur. Abdomen:  Soft, non-tender. BS present. Extremities:  No edema present. No cyanosis. No clubbing. CNS: AxOx2, Cranial nerves grossly intact, moves all 4 extremities.  Skin: Warm and dry.   Intake/Output from previous day: 05/03 0701 - 05/04 0700 In: 1486.7 [I.V.:1136.7; IV Piggyback:350] Out: 375 [Urine:375]    Lab Results: BMET    Component Value Date/Time   NA 140 04/04/2018 1022   NA 140 04/04/2018 0336   NA 139 04/03/2018 0340   K 4.3 04/04/2018 1022   K 3.9 04/04/2018 0336   K 3.9 04/03/2018 0340   CL 110 04/04/2018 1022   CL 113 (H) 04/04/2018 0336   CL 109 04/03/2018 0340   CO2 19 (L) 04/04/2018 1022   CO2 18 (L) 04/04/2018 0336   CO2 19 (L) 04/03/2018 0340   GLUCOSE 97 04/04/2018 1022   GLUCOSE 110 (H) 04/04/2018 0336   GLUCOSE 96 04/03/2018 0340   BUN 21  (H) 04/04/2018 1022   BUN 22 (H) 04/04/2018 0336   BUN 32 (H) 04/03/2018 0340   CREATININE 1.22 (H) 04/04/2018 1022   CREATININE 1.17 (H) 04/04/2018 0336   CREATININE 1.68 (H) 04/03/2018 0340   CALCIUM 8.8 (L) 04/04/2018 1022   CALCIUM 8.4 (L) 04/04/2018 0336   CALCIUM 8.4 (L) 04/03/2018 0340   GFRNONAA 38 (L) 04/04/2018 1022   GFRNONAA 40 (L) 04/04/2018 0336   GFRNONAA 26 (L) 04/03/2018 0340   GFRAA 44 (L) 04/04/2018 1022   GFRAA 47 (L) 04/04/2018 0336   GFRAA 30 (L) 04/03/2018 0340   CBC    Component Value Date/Time   WBC 23.5 (H) 04/04/2018 1022   RBC 3.18 (L) 04/04/2018 1022   HGB 9.6 (L) 04/04/2018 1022   HCT 29.1 (L) 04/04/2018 1022   PLT 327 04/04/2018 1022   MCV 91.5 04/04/2018 1022   MCH 30.2 04/04/2018 1022   MCHC 33.0 04/04/2018 1022   RDW 14.8 04/04/2018 1022   LYMPHSABS 1.6 04/01/2018 1928   MONOABS 1.8 (H) 04/01/2018 1928   EOSABS 0.0 04/01/2018 1928   BASOSABS 0.0 04/01/2018 1928   HEPATIC Function Panel Recent Labs    04/01/18 1928  PROT 6.9   HEMOGLOBIN A1C No components found for: HGA1C,  MPG CARDIAC ENZYMES No results found for: CKTOTAL, CKMB, CKMBINDEX, TROPONINI BNP No results for input(s): PROBNP in the last 8760 hours.  TSH Recent Labs    04/01/18 2347  TSH 0.977   CHOLESTEROL No results for input(s): CHOL in the last 8760 hours.  Scheduled Meds: . aspirin  81 mg Oral Daily  . docusate sodium  100 mg Oral Daily  . enoxaparin (LOVENOX) injection  40 mg Subcutaneous Q24H  . feeding supplement (ENSURE ENLIVE)  237 mL Oral BID BM  . guaiFENesin  1,200 mg Oral BID  . levalbuterol  1.25 mg Nebulization TID  . levothyroxine  50 mcg Oral QAC breakfast  . mouth rinse  15 mL Mouth Rinse BID  . metoprolol tartrate  12.5 mg Oral BID   Continuous Infusions: . sodium chloride Stopped (04/06/18 0815)  . azithromycin Stopped (04/05/18 2200)  . cefTRIAXone (ROCEPHIN)  IV     PRN Meds:.acetaminophen, lidocaine (PF), polyethylene  glycol  Assessment/Plan: Empyema of left lung Left lung pneumonia Bronchomalacia Septic shock- improving Acute kidney injury Hypertension Hypothyroidism Anemia of chronic disease  Follow with CCM. Discussed care with family and patient.    LOS: 5 days    Dixie Dials  MD  04/06/2018, 11:56 AM

## 2018-04-06 NOTE — Progress Notes (Addendum)
Monique Lin PCCM Progress Note    Brief Summary: 84 y/o F admitted 4/29 with 2 week hx of progressive SOB.  Seen 4/21 and treated for bronchitis with prednisone + antihistamines without improvement. CXR clear at that time.  Returned 4/29 with generalized weakness, decreased appetite and AMS.  Work up concerning for LLL opacity with associated pleural effusion & hypotension requiring vasopressors.  CXR has had intermittent white out on left despite bronchoscopy and attempts at thoracentesis (limited fluid returned).  Bronchoscopy demonstrated bronchomalacia.     S:  No acute events overnight.  RN reports pt remains on 2L O2, weaning.  Concerned for generalized weakness.  Daughter at bedside, reports significant overall improvement since admit.     O: Blood pressure (!) 160/82, pulse 87, temperature 97.9 F (36.6 C), temperature source Oral, resp. rate (!) 22, height 5' 2.5" (1.588 m), weight 181 lb 10.5 oz (82.4 kg), SpO2 100 %.  General: frail elderly female in NAD lying in bed HEENT: MM pink/moist Neuro: Awakens to voice, speech clear, appropriate  CV: s1s2 rrr, no m/r/g PULM: even/non-labored, clear on R, left with wheezing ZO:XWRU, non-tender, bsx4 active  Extremities: warm/dry, 1-2+ generalized pitting edema in all 4 extremities  Skin: no rashes or lesions  CBC Latest Ref Rng & Units 04/04/2018 04/04/2018 04/03/2018  WBC 4.0 - 10.5 K/uL 23.5(H) 21.8(H) 22.4(H)  Hemoglobin 12.0 - 15.0 g/dL 9.6(L) 8.7(L) 8.6(L)  Hematocrit 36.0 - 46.0 % 29.1(L) 26.6(L) 25.2(L)  Platelets 150 - 400 K/uL 327 309 268    BMP Latest Ref Rng & Units 04/04/2018 04/04/2018 04/03/2018  Glucose 65 - 99 mg/dL 97 110(H) 96  BUN 6 - 20 mg/dL 21(H) 22(H) 32(H)  Creatinine 0.44 - 1.00 mg/dL 1.22(H) 1.17(H) 1.68(H)  Sodium 135 - 145 mmol/L 140 140 139  Potassium 3.5 - 5.1 mmol/L 4.3 3.9 3.9  Chloride 101 - 111 mmol/L 110 113(H) 109  CO2 22 - 32 mmol/L 19(L) 18(L) 19(L)  Calcium 8.9 - 10.3 mg/dL 8.8(L) 8.4(L) 8.4(L)   CXR  5/3 - slight improvement in LLL effusion   Antibiotics Rocephin 4/29 >>  Azithro 4/29 >>   Cultures Pleural fluid 5/3 >> rare WBC, moderate GNR >>  BCx2 4/29 >>   A: LLL PNA  Concern for LLL Empyema - GNR on gram stain of pleural fluid  Bronchomalacia  Left Pleural Effusion  Septic Shock - resolved  P: Mobilize - OOB, PT consult  Pulmonary hygiene- IS, mobilize  Tx to tele  D6/X abx, stop date added to azithro.  Keep rocephin until pleural culture mature Continue mucinex BID  3% hypertonic saline nebulized for 3 days Follow cultures / ABX Follow up CXR in am  Lasix 40 mg IVx1    Global - rest of care per primary MD.  Transfer to telemetry floor.    Noe Gens, NP-C [facility] Pgr: [phone] or if no answer [phone] 04/06/2018, 9:06 AM

## 2018-04-06 NOTE — Progress Notes (Signed)
Pt up to bedside commode.  Rn to consult IV Team once back to bed.

## 2018-04-06 NOTE — Evaluation (Signed)
Physical Therapy Evaluation Patient Details Name: Monique Lin MRN: [id number] DOB: [birth year] Today's Date: 04/06/2018   History of Present Illness  84 y/o F admitted 4/29 with 2 week hx of progressive SOB.  Seen 4/21 and treated for bronchitis with prednisone + antihistamines without improvement. CXR clear at that time.  Returned 4/29 with generalized weakness, decreased appetite and AMS.  Work up concerning for LLL opacity with associated pleural effusion & hypotension requiring vasopressors.  CXR has had intermittent white out on left despite bronchoscopy and attempts at thoracentesis (limited fluid returned).  Bronchoscopy demonstrated bronchomalacia, plan for chest CT 5/4.     Clinical Impression  Pt admitted with above diagnosis. Pt currently with functional limitations due to the deficits listed below (see PT Problem List). PTA, pt living with daughter, mod I with mobility utilizing SPC for community and household ambulation. Upon eval patient presents with generalized weakness and functional decline from baseline. Currently mod A x2 for all mobility and unable to ambulate this visit due to weakness. SpO2 remained at 92% on 1L during stand pivot transfers to [facility] today. Daughter expresses strong desire to have patient return home. Patient does not have 24/7 support as daughter works during day, and is currently increased risk of falling requiring physical hands on assistance. Discussed with family that PT will continue to progress patient as tolerated towards baseline and recommend HHPT if appropriate.  Pt will benefit from skilled PT to increase their independence and safety with mobility to allow discharge to the venue listed below.       Follow Up Recommendations SNF;Supervision/Assistance - 24 hour     Equipment Recommendations  Rolling walker with 5" wheels;3in1 (PT)    Recommendations for Other Services OT consult     Precautions / Restrictions Precautions Precautions:  Fall Restrictions Weight Bearing Restrictions: No      Mobility  Bed Mobility Overal bed mobility: Needs Assistance Bed Mobility: Supine to Sit     Supine to sit: Mod assist     General bed mobility comments: Mod A and use of bed pad to pivot patient to EOB, bring legs over EOB and support trunk   Transfers Overall transfer level: Needs assistance Equipment used: 2 person hand held assist Transfers: Sit to/from [facility] Sit to Stand: Mod assist;+2 physical assistance Stand pivot transfers: Mod assist;+2 physical assistance       General transfer comment: Mod A x 2 to stand and pivot to bedside commode. patient could tolerate standing for 20 seconds.   Ambulation/Gait             General Gait Details: unable this visit  Stairs            Wheelchair Mobility    Modified Rankin (Stroke Patients Only)       Balance Overall balance assessment: Needs assistance   Sitting balance-Leahy Scale: Fair       Standing balance-Leahy Scale: Poor                               Pertinent Vitals/Pain Pain Assessment: No/denies pain    Home Living Family/patient expects to be discharged to:: Private residence Living Arrangements: Children Available Help at Discharge: Family;Available PRN/intermittently Type of Home: House Home Access: Stairs to enter Entrance Stairs-Rails: Psychiatric nurse of Steps: 2 Home Layout: One level Home Equipment: Cane - single point      Prior Function Level of Independence: Needs assistance  Gait / Transfers Assistance Needed: independent with [facility] community and household.   ADL's / Homemaking Assistance Needed: daughter helped patient with bathing        Hand Dominance        Extremity/Trunk Assessment   Upper Extremity Assessment Upper Extremity Assessment: Defer to OT evaluation;Overall [facility] for tasks assessed    Lower Extremity Assessment Lower Extremity  Assessment: Generalized weakness       Communication   Communication: No difficulties  Cognition Arousal/Alertness: Awake/alert Behavior During Therapy: WFL for tasks assessed/performed Overall Cognitive Status: Within Functional Limits for tasks assessed                                        General Comments      Exercises General Exercises - Lower Extremity Ankle Circles/Pumps: 20 reps Long Arc Quad: 10 reps   Assessment/Plan    PT Assessment Patient needs continued PT services  PT Problem List Decreased strength;Decreased range of motion;Decreased activity tolerance;Decreased balance;Decreased mobility;Decreased coordination;Decreased cognition;Decreased knowledge of use of DME       PT Treatment Interventions DME instruction;Gait training;Stair training;Functional mobility training;Therapeutic activities;Therapeutic exercise;Balance training    PT Goals (Current goals can be found in the Care Plan section)  Acute Rehab PT Goals Patient Stated Goal: patient and family wish to return home PT Goal Formulation: With patient/family Time For Goal Achievement: 04/20/18 Potential to Achieve Goals: Fair    Frequency Min 3X/week(family hoping to progress to HHPT)   Barriers to discharge Decreased caregiver support(family at work during day)      Co-evaluation               AM-PAC PT "6 Clicks" Daily Activity  Outcome Measure Difficulty turning over in bed (including adjusting bedclothes, sheets and blankets)?: Unable Difficulty moving from lying on back to sitting on the side of the bed? : Unable Difficulty sitting down on and standing up from a chair with arms (e.g., wheelchair, bedside commode, etc,.)?: Unable Help needed moving to and from a bed to chair (including a wheelchair)?: A Lot Help needed walking in hospital room?: A Lot Help needed climbing 3-5 steps with a railing? : Total 6 Click Score: 8    End of Session Equipment Utilized  During Treatment: Gait belt;Oxygen Activity Tolerance: Patient limited by fatigue Patient left: in chair;with call bell/phone within reach;with nursing/sitter in room;with family/visitor present Nurse Communication: Mobility status PT Visit Diagnosis: Unsteadiness on feet (R26.81);History of falling (Z91.81);Muscle weakness (generalized) (M62.81)    Time: 1975-8832 PT Time Calculation (min) (ACUTE ONLY): 34 min   Charges:   PT Evaluation $PT Eval Low Complexity: 1 Low PT Treatments $Therapeutic Activity: 8-22 mins   PT G Codes:       Reinaldo Berber, PT, DPT Acute Rehab Services Pager: [phone]    Reinaldo Berber 04/06/2018, 1:34 PM

## 2018-04-06 NOTE — Plan of Care (Signed)
Monique Lin [id number] Admission Data: 04/06/2018 8:08 PM Attending Provider: [name], MD  [id number], Prudencio Burly, MD Consults/ Treatment Team: Treatment Team:  [name], MD  Monique Lin is a 84 y.o. female patient admitted from ICU awake, alert  & orientated  X 3,  Full Code, VSS - Blood pressure (!) 153/68, pulse 94, temperature 98.4 F (36.9 C), temperature source Oral, resp. rate 18, height 5' 2.5" (1.588 m), weight 82.4 kg (181 lb 10.5 oz), SpO2 98 %., O2    2 L nasal cannular, no c/o shortness of breath, no c/o chest pain, no distress noted. Tele # 13 placed and pt is currently running:normal sinus rhythm.   IV site WDL:  hand left, condition patent and no redness with a transparent dsg that's clean dry and intact.  Allergies:   Allergies  Allergen Reactions   Codeine Other (See Comments)    Stomach cramps   Penicillins Other (See Comments)    "Bad stomach cramps" Has patient had a PCN reaction causing immediate rash, facial/tongue/throat swelling, SOB or lightheadedness with hypotension: Unk Has patient had a PCN reaction causing severe rash involving mucus membranes or skin necrosis: Unk Has patient had a PCN reaction that required hospitalization: Unk Has patient had a PCN reaction occurring within the last 10 years: No If all of the above answers are "NO", then may proceed with Cephalosporin use.       Past Medical History:  Diagnosis Date   Bronchitis    Hypertension    Leg pain    Stomach tumor (benign)     History:  obtained from ICU Nurse . Tobacco/alcohol: denied none  Pt orientation to unit, room and routine. Information packet given to patient/family and safety video watched.  Admission INP armband ID verified with patient/family, and in place. SR up x 2, fall risk assessment complete with Patient and family verbalizing understanding of risks associated with falls. Pt verbalizes an understanding of how to use the call bell and to call for help before  getting out of bed.  Skin, clean-dry- intact without evidence of bruising, or skin tears.   No evidence of skin break down noted on exam. no rashes, no ecchymoses, no petechiae, no nodules, no jaundice, no purpura MASD under breast. Old sacral wounds foam for prophylaxis    Will cont to monitor and assist as needed.  Salley Slaughter, RN 04/06/2018 8:08 PM

## 2018-04-07 ENCOUNTER — Other Ambulatory Visit: Payer: Self-pay

## 2018-04-07 ENCOUNTER — Encounter (HOSPITAL_COMMUNITY): Payer: Self-pay

## 2018-04-07 ENCOUNTER — Inpatient Hospital Stay (HOSPITAL_COMMUNITY): Payer: Medicare Other

## 2018-04-07 DIAGNOSIS — J181 Lobar pneumonia, unspecified organism: Secondary | ICD-10-CM

## 2018-04-07 DIAGNOSIS — J869 Pyothorax without fistula: Secondary | ICD-10-CM

## 2018-04-07 LAB — BASIC METABOLIC PANEL
ANION GAP: 10 (ref 5–15)
BUN: 13 mg/dL (ref 6–20)
CHLORIDE: 111 mmol/L (ref 101–111)
CO2: 22 mmol/L (ref 22–32)
Calcium: 8.7 mg/dL — ABNORMAL LOW (ref 8.9–10.3)
Creatinine, Ser: 0.9 mg/dL (ref 0.44–1.00)
GFR calc Af Amer: >60 mL/min (ref >60)
GFR calc non Af Amer: 55 mL/min — ABNORMAL LOW (ref >60)
GLUCOSE: 142 mg/dL — AB (ref 65–99)
Potassium: 3.4 mmol/L — ABNORMAL LOW (ref 3.5–5.1)
Sodium: 143 mmol/L (ref 135–145)

## 2018-04-07 LAB — CBC
HEMATOCRIT: 24.8 % — AB (ref 36.0–46.0)
Hemoglobin: 7.9 g/dL — ABNORMAL LOW (ref 12.0–15.0)
MCH: 28.4 pg (ref 26.0–34.0)
MCHC: 31.9 g/dL (ref 30.0–36.0)
MCV: 89.2 fL (ref 78.0–100.0)
Platelets: 439 10*3/uL — ABNORMAL HIGH (ref 150–400)
RBC: 2.78 MIL/uL — ABNORMAL LOW (ref 3.87–5.11)
RDW: 14.9 % (ref 11.5–15.5)
WBC: 12.5 10*3/uL — AB (ref 4.0–10.5)

## 2018-04-07 LAB — CULTURE, BLOOD (ROUTINE X 2)
CULTURE: NO GROWTH
Culture: NO GROWTH
SPECIAL REQUESTS: ADEQUATE

## 2018-04-07 MED ORDER — LIDOCAINE HCL 1 % IJ SOLN
INTRAMUSCULAR | Status: AC
  Filled 2018-04-07: qty 20

## 2018-04-07 MED ORDER — POTASSIUM CHLORIDE CRYS ER 10 MEQ PO TBCR
10.0000 meq | EXTENDED_RELEASE_TABLET | Freq: Two times a day (BID) | ORAL | Status: AC
  Administered 2018-04-07 – 2018-04-09 (×6): 10 meq via ORAL
  Filled 2018-04-07 (×7): qty 1

## 2018-04-07 MED ORDER — OXYCODONE HCL 5 MG PO TABS
5.0000 mg | ORAL_TABLET | Freq: Four times a day (QID) | ORAL | Status: DC | PRN
  Administered 2018-04-07 – 2018-04-25 (×46): 5 mg via ORAL
  Filled 2018-04-07 (×49): qty 1

## 2018-04-07 MED ORDER — FENTANYL CITRATE (PF) 100 MCG/2ML IJ SOLN
INTRAMUSCULAR | Status: AC | PRN
  Administered 2018-04-07: 25 ug via INTRAVENOUS

## 2018-04-07 MED ORDER — MIDAZOLAM HCL 2 MG/2ML IJ SOLN
INTRAMUSCULAR | Status: AC
  Filled 2018-04-07: qty 2

## 2018-04-07 MED ORDER — FENTANYL CITRATE (PF) 100 MCG/2ML IJ SOLN
INTRAMUSCULAR | Status: AC
  Filled 2018-04-07: qty 2

## 2018-04-07 MED ORDER — MIDAZOLAM HCL 2 MG/2ML IJ SOLN
INTRAMUSCULAR | Status: AC | PRN
  Administered 2018-04-07 (×2): 0.5 mg via INTRAVENOUS

## 2018-04-07 NOTE — Procedures (Signed)
Interventional Radiology Procedure Note  Procedure: CT guided left pleural drainage catheter placement  Complications: None  Estimated Blood Loss: None  Findings: 12 Fr pigtail drain placed from left posterior intercostal approach.  Return of amber colored fluid.  Connected to [employer].  Venetia Night. Kathlene Cote, M.D Pager:  [phone]

## 2018-04-07 NOTE — Progress Notes (Signed)
Pt transferred from [facility] this evening. Chest tube to suction. Condition stable

## 2018-04-07 NOTE — Progress Notes (Signed)
MD ordered ok to transfer to IR off monitor to CT/IR. CT called and asked to place on monitor once down there.

## 2018-04-07 NOTE — Progress Notes (Signed)
[facility] Progress Note    Brief Summary: 84 y/o F admitted 4/29 with 2 week hx of progressive SOB.  Seen 4/21 and treated for bronchitis with prednisone + antihistamines without improvement. CXR clear at that time.  Returned 4/29 with generalized weakness, decreased appetite and AMS.  Work up concerning for LLL opacity with associated pleural effusion & hypotension requiring vasopressors.  CXR has had intermittent white out on left despite bronchoscopy and attempts at thoracentesis (limited fluid returned).  Bronchoscopy demonstrated bronchomalacia.     S:  CT chest completed 5/4.  RN reports no acute events overnight.  Daughter states tylenol does not seem to be holding her in regards to pain.  Not much sleep overnight.   O: Blood pressure (!) 145/66, pulse 85, temperature 99.4 F (37.4 C), temperature source Oral, resp. rate 16, height 5' 2.5" (1.588 m), weight 195 lb 12.3 oz (88.8 kg), SpO2 100 %.  General:  Elderly female in NAD HEENT: MM pink/moist Neuro: AAOx4, speech clear, MAE  CV: s1s2 rrr, no m/r/g PULM: even/non-labored, clear on R, diminished with rhonchi on L QJ:JHER, non-tender, bsx4 active  Extremities: warm/dry, generalized 2+ edema  Skin: no rashes or lesions   CBC Latest Ref Rng & Units 04/07/2018 04/04/2018 04/04/2018  WBC 4.0 - 10.5 K/uL 12.5(H) 23.5(H) 21.8(H)  Hemoglobin 12.0 - 15.0 g/dL 7.9(L) 9.6(L) 8.7(L)  Hematocrit 36.0 - 46.0 % 24.8(L) 29.1(L) 26.6(L)  Platelets 150 - 400 K/uL 439(H) 327 309    BMP Latest Ref Rng & Units 04/07/2018 04/06/2018 04/04/2018  Glucose 65 - 99 mg/dL 142(H) 135(H) 97  BUN 6 - 20 mg/dL 13 13 21(H)  Creatinine 0.44 - 1.00 mg/dL 0.90 1.01(H) 1.22(H)  Sodium 135 - 145 mmol/L 143 140 140  Potassium 3.5 - 5.1 mmol/L 3.4(L) 3.6 4.3  Chloride 101 - 111 mmol/L 111 111 110  CO2 22 - 32 mmol/L 22 17(L) 19(L)  Calcium 8.9 - 10.3 mg/dL 8.7(L) 9.1 8.8(L)   CXR 5/3 - slight improvement in LLL effusion   Antibiotics Rocephin 4/29 >>  Azithro  4/29 >>   Cultures Pleural fluid 5/3 >> rare WBC, moderate GNR >>  BCx2 4/29 >> negative  BCx2 4/29 >>   A: LLL PNA  Concern for LLL Empyema - GNR on gram stain of pleural fluid  Bronchomalacia  Left Pleural Effusion  Septic Shock - resolved  P: Will ask IR to place CT guided small bore chest tube.  Once placed, will proceed with tPA, pulmozyme.  Appreciate IR assistance > plan for insertion 5/5 per PA.  Mobilize - OOB / PT efforts D7/x abx.  Await pleural cultures. Stop date in place for azithro.  Continue rocephin.  Follow cultures  Continue mucinex BID  3% hypertonic saline for 3 days Follow intermittent CXR    Global - rest per primary MD.    Noe Gens, NP-C [facility] Pgr: [phone] or if no answer [phone] 04/07/2018, 8:48 AM

## 2018-04-07 NOTE — Progress Notes (Signed)
Ref: [name], MD   Subjective:  Awake. T max 100.3  She had successful left pleural drainage catheter placement by Interventional Radiologist, Dr. Kathlene Cote.  Mild respiratory distress continues.  Objective:  Vital Signs in the last 24 hours: Temp:  [98.4 F (36.9 C)-100.3 F (37.9 C)] 99 F (37.2 C) (05/05 1233) Pulse Rate:  [85-98] 90 (05/05 1140) Cardiac Rhythm: Normal sinus rhythm (05/05 1140) Resp:  [16-28] 20 (05/05 1233) BP: (120-155)/(49-84) 153/84 (05/05 1233) SpO2:  [98 %-100 %] 100 % (05/05 1233) Weight:  [88.8 kg (195 lb 12.3 oz)] 88.8 kg (195 lb 12.3 oz) (05/05 0135)  Physical Exam: BP Readings from Last 1 Encounters:  04/07/18 (!) 153/84     Wt Readings from Last 1 Encounters:  04/07/18 88.8 kg (195 lb 12.3 oz)    Weight change: 6.4 kg (14 lb 1.8 oz) Body mass index is 35.24 kg/m. HEENT: [place]/AT, Eyes-Brown, PERL, EOMI, Conjunctiva-Pale, Sclera-Non-icteric Neck: No JVD, No bruit, Trachea midline. Lungs:  Clear, anteriorly. Bilateral. Decreased breath sounds at bases. Left sided chest tube. Cardiac:  Regular rhythm, normal S1 and S2, no S3. II/VI systolic murmur. Abdomen:  Soft, non-tender. BS present. Extremities:  No edema present. No cyanosis. No clubbing. Right hand edema for IV infiltration. CNS: AxOx2, Cranial nerves grossly intact, moves all 4 extremities.  Skin: Warm and dry.   Intake/Output from previous day: 05/04 0701 - 05/05 0700 In: 1129.5 [P.O.:717; I.V.:62.5; IV Piggyback:350] Out: 930 [Urine:930]    Lab Results: BMET    Component Value Date/Time   NA 143 04/07/2018 0407   NA 140 04/06/2018 1141   NA 140 04/04/2018 1022   K 3.4 (L) 04/07/2018 0407   K 3.6 04/06/2018 1141   K 4.3 04/04/2018 1022   CL 111 04/07/2018 0407   CL 111 04/06/2018 1141   CL 110 04/04/2018 1022   CO2 22 04/07/2018 0407   CO2 17 (L) 04/06/2018 1141   CO2 19 (L) 04/04/2018 1022   GLUCOSE 142 (H) 04/07/2018 0407   GLUCOSE 135 (H) 04/06/2018 1141   GLUCOSE 97 04/04/2018 1022   BUN 13 04/07/2018 0407   BUN 13 04/06/2018 1141   BUN 21 (H) 04/04/2018 1022   CREATININE 0.90 04/07/2018 0407   CREATININE 1.01 (H) 04/06/2018 1141   CREATININE 1.22 (H) 04/04/2018 1022   CALCIUM 8.7 (L) 04/07/2018 0407   CALCIUM 9.1 04/06/2018 1141   CALCIUM 8.8 (L) 04/04/2018 1022   GFRNONAA 55 (L) 04/07/2018 0407   GFRNONAA 48 (L) 04/06/2018 1141   GFRNONAA 38 (L) 04/04/2018 1022   GFRAA >60 04/07/2018 0407   GFRAA 56 (L) 04/06/2018 1141   GFRAA 44 (L) 04/04/2018 1022   CBC    Component Value Date/Time   WBC 12.5 (H) 04/07/2018 0407   RBC 2.78 (L) 04/07/2018 0407   HGB 7.9 (L) 04/07/2018 0407   HCT 24.8 (L) 04/07/2018 0407   PLT 439 (H) 04/07/2018 0407   MCV 89.2 04/07/2018 0407   MCH 28.4 04/07/2018 0407   MCHC 31.9 04/07/2018 0407   RDW 14.9 04/07/2018 0407   LYMPHSABS 1.6 04/01/2018 1928   MONOABS 1.8 (H) 04/01/2018 1928   EOSABS 0.0 04/01/2018 1928   BASOSABS 0.0 04/01/2018 1928   HEPATIC Function Panel Recent Labs    04/01/18 1928  PROT 6.9   HEMOGLOBIN A1C No components found for: HGA1C,  MPG CARDIAC ENZYMES No results found for: CKTOTAL, CKMB, CKMBINDEX, TROPONINI BNP No results for input(s): PROBNP in the last 8760 hours. TSH Recent  Labs    04/01/18 2347  TSH 0.977   CHOLESTEROL No results for input(s): CHOL in the last 8760 hours.  Scheduled Meds: . aspirin  81 mg Oral Daily  . docusate sodium  100 mg Oral Daily  . [START ON 04/08/2018] enoxaparin (LOVENOX) injection  40 mg Subcutaneous Q24H  . feeding supplement (ENSURE ENLIVE)  237 mL Oral BID BM  . fentaNYL      . guaiFENesin  1,200 mg Oral BID  . levalbuterol  1.25 mg Nebulization TID  . levothyroxine  50 mcg Oral QAC breakfast  . lidocaine      . mouth rinse  15 mL Mouth Rinse BID  . metoprolol tartrate  12.5 mg Oral BID  . midazolam      . potassium chloride  10 mEq Oral BID   Continuous Infusions: . sodium chloride 50 mL/hr at 04/07/18 0800  .  azithromycin 500 mg (04/06/18 2107)  . cefTRIAXone (ROCEPHIN)  IV 2 g (04/06/18 2251)   PRN Meds:.acetaminophen, oxyCODONE, polyethylene glycol  Assessment/Plan: Empyema of left lung Left lung pneumonia Bronchomalacia Septic shock-improved Acute kidney injury- improved Hypertension Hypothyroidism Anemia of chronic disease  Continue medical treatment. Appreciate radiology help.    LOS: 6 days    Dixie Dials  MD  04/07/2018, 12:55 PM

## 2018-04-07 NOTE — Progress Notes (Signed)
OT Cancellation Note  Patient Details Name: Monique Lin MRN: [id number] DOB: 01/22/1927   Cancelled Treatment:    Reason Eval/Treat Not Completed: Patient at procedure or test/ unavailable(CT). OT will check back for evaluation as schedule allows.   [place] 04/07/2018, 9:58 AM  Hulda Humphrey OTR/L [phone]

## 2018-04-07 NOTE — H&P (Signed)
Chief Complaint: Empyema  Referring Physician(s): Donita Brooks  Supervising Physician: Aletta Edouard  Patient Status: [facility] - In-pt  History of Present Illness: Monique Lin is a 84 y.o. female with past medical history significant for hypertension, hypothyroidism, hyperlipidemia, history of bronchial asthma, morbid obesity, history of GI stromal tumor in the past history of upper GI bleed in the past.  She came to the ER on 04/01/18 complaining of cough associated with left sided chest pain , difficulty breathing, generalized weakness, and poor appetite for last 7 days.   She denied fever but states has been feeling cold.  Workup in the ED showed the patient was hypotensive and hypoxic.  X-ray showed large left-sided pleural effusion with white count of 26,000.  She was started on IV Rocephin and Zithromax.   She was also found to have acute renal failure with creatinine of 3.59.   Patient was seen approximately 2 weeks ago in ED and was noted to have abnormal x-ray and creatinine and was treated for bronchitis.  She had a thoracentesis by Dr. Kathlene Cote on 04/05/2018 which revealed purulent fluid.  Pleural fluid was positive for gram negative rods.  We are asked to place a pigtail chest catheter to drain the empyema.  She is NPO.   Past Medical History:  Diagnosis Date  . Bronchitis   . Hypertension   . Leg pain   . Stomach tumor (benign)     Past Surgical History:  Procedure Laterality Date  . IR THORACENTESIS ASP PLEURAL SPACE W/IMG GUIDE  04/05/2018  . NASAL SINUS SURGERY    . stomach tumor removal      Allergies: Codeine and Penicillins  Medications: Prior to Admission medications   Medication Sig Start Date End Date Taking? Authorizing Provider  albuterol (PROVENTIL HFA;VENTOLIN HFA) 108 (90 BASE) MCG/ACT inhaler Inhale 2 puffs into the lungs every 6 (six) hours as needed for wheezing or shortness of breath.    Yes [provider]    albuterol (PROVENTIL) (2.5 MG/3ML) 0.083% nebulizer solution Take 2.5 mg by nebulization every 6 (six) hours as needed for wheezing.   Yes [provider]  amLODipine (NORVASC) 5 MG tablet Take 5 mg by mouth daily.   Yes [provider]  aspirin EC 81 MG tablet Take 81 mg by mouth daily.   Yes [provider]  atorvastatin (LIPITOR) 20 MG tablet Take 20 mg by mouth daily.   Yes [provider]  carvedilol (COREG) 25 MG tablet Take 25 mg by mouth 2 (two) times daily with a meal.   Yes [provider]  Cholecalciferol (VITAMIN D-3 PO) Take 1 capsule by mouth daily.   Yes [provider]  Cyanocobalamin (VITAMIN B-12 PO) Take 500 mcg by mouth daily.    Yes [provider]  doxazosin (CARDURA) 4 MG tablet Take 4 mg by mouth at bedtime.    Yes [provider]  fluticasone (FLONASE) 50 MCG/ACT nasal spray Place 2 sprays into the nose daily as needed for allergies or rhinitis.    Yes [provider]  FOLIC ACID PO Take 1 tablet by mouth daily.    Yes [provider]  gabapentin (NEURONTIN) 300 MG capsule Take 300 mg by mouth 3 (three) times daily.    Yes [provider]  levothyroxine (SYNTHROID, LEVOTHROID) 50 MCG tablet Take 50 mcg by mouth daily.   Yes [provider]  Liniments (SALONPAS PAIN RELIEF PATCH EX) Apply 1 patch topically  daily as needed (pain).   Yes [provider]  losartan-hydrochlorothiazide (HYZAAR) 50-12.5 MG per tablet Take 1 tablet by mouth daily.   Yes [provider]  traMADol (ULTRAM) 50 MG tablet Take 1 tablet (50 mg total) by mouth every 6 (six) hours as needed. Patient taking differently: Take 50 mg by mouth See admin instructions. Take 50 mg by mouth once a day as needed for pain and 50 mg at bedtime scheduled 03/17/18  Yes Daleen Bo, MD  trolamine salicylate (ASPERCREME) 10 % cream Apply 1 application topically as needed (pain).   Yes [provider]  cefdinir (OMNICEF) 300 MG capsule Take 1 capsule (300 mg total) by mouth 2 (two) times daily. Patient not taking: Reported on 04/01/2018 05/17/13   Orpah Greek, MD     History reviewed. No pertinent family history.  Social History   Socioeconomic History  . Marital status: Widowed    Spouse name: Not on file  . Number of children: Not on file  . Years of education: Not on file  . Highest education level: Not on file  Occupational History  . Not on file  Social Needs  . Financial resource strain: Not on file  . Food insecurity:    Worry: Not on file    Inability: Not on file  . Transportation needs:    Medical: Not on file    Non-medical: Not on file  Tobacco Use  . Smoking status: Never Smoker  . Smokeless tobacco: Never Used  Substance and Sexual Activity  . Alcohol use: No  . Drug use: No  . Sexual activity: Not on file  Lifestyle  . Physical activity:    Days per week: Not on file    Minutes per session: Not on file  . Stress: Not on file  Relationships  . Social connections:    Talks on phone: Not on file    Gets together: Not on file    Attends religious service: Not on file    Active member of club or organization: Not on file    Attends meetings of clubs or organizations: Not on file    Relationship status: Not on file  Other Topics Concern  . Not on file  Social History Narrative  . Not on file     Review of Systems: A 12 point ROS discussed and pertinent positives are indicated in the HPI above.  All other systems are negative. Review of Systems  Vital Signs: BP (!) 145/66 (BP Location: Left Arm)   Pulse 85   Temp 99.4 F (37.4 C) (Oral)   Resp 16   Ht 5' 2.5" (1.588 m)   Wt 195 lb 12.3 oz (88.8 kg)   SpO2 100%   BMI 35.24 kg/m   Physical Exam  Constitutional: She is oriented to person, place, and time.  Elderly, obese, NAD  HENT:  Head: Normocephalic and atraumatic.  Eyes: EOM are normal.  Neck: Normal range  of motion.  Cardiovascular: Normal rate, regular rhythm and normal heart sounds.  Pulmonary/Chest: Effort normal.  Breath sounds diminished left base.  Abdominal: Soft. She exhibits no distension.  Musculoskeletal: Normal range of motion.  Neurological: She is alert and oriented to person, place, and time.  Skin: Skin is warm and dry.  Psychiatric: She has a normal mood and affect. Her behavior is normal. Judgment and thought content normal.  Vitals reviewed.   Imaging: Dg Chest 1 View  Result Date: 04/05/2018 CLINICAL DATA:  84 year old  female status post ultrasound-guided left side thoracentesis this afternoon. EXAM: CHEST  1 VIEW COMPARISON:  Thoracentesis ultrasound images 1636 hours today. Portable chest 0450 hours today, and earlier. FINDINGS: Portable AP semi upright view at 1656 hours. Reduced left pleural effusion volume and improved left upper lung ventilation following thoracentesis. No pneumothorax identified. Continued moderate opacification of the left mid and lower lung, with evidence of some left lung volume loss resulting in leftward shift of the mediastinum as seen on the CT 04/02/2018. Calcified aortic atherosclerosis. Visible mediastinal contours are within normal limits. Allowing for portable technique the right lung remains clear. IMPRESSION: 1. Regressed left pleural fluid and improved left upper lung ventilation following thoracentesis with no pneumothorax or adverse features. 2. Continued combination of left lower lung opacification and volume loss. 3. No new cardiopulmonary abnormality identified. Electronically Signed   By: Genevie Ann M.D.   On: 04/05/2018 17:26   Dg Chest 2 View  Result Date: 03/17/2018 CLINICAL DATA:  Shortness of breath with cough and congestion EXAM: CHEST - 2 VIEW COMPARISON:  05/17/2013 FINDINGS: Normal heart size and mediastinal contours. Streaky opacity in the retrocardiac and right infrahilar lung that was also seen previously. There is airway  thickening and lower lung scarring on a 2011 abdominal CT. No acute osseous finding. IMPRESSION: 1. No acute finding when compared to prior. 2. Chronic lower lobe scarring seen since at least 2011 abdominal CT. Electronically Signed   By: Monte Fantasia M.D.   On: 03/17/2018 16:00   Ct Head Wo Contrast  Result Date: 04/01/2018 CLINICAL DATA:  Increased weakness.  Leaning to the left. EXAM: CT HEAD WITHOUT CONTRAST TECHNIQUE: Contiguous axial images were obtained from the base of the skull through the vertex without intravenous contrast. COMPARISON:  December 22, 2012 FINDINGS: Brain: No subdural, epidural, or subarachnoid hemorrhage. Cerebellum, brainstem, and basal cisterns are normal. Ventricles and sulci are unchanged. White matter changes are identified. No acute cortical ischemia or infarct. No mass effect or midline shift. Vascular: Calcified atherosclerosis in the intracranial carotids. Skull: Normal. Negative for fracture or focal lesion. Sinuses/Orbits: Fluid and mucosal thickening in the paranasal sinuses is increased since 2014. Mastoid air cells and middle ears are normal. Other: None. IMPRESSION: 1. Significant sinus disease as above. 2. No acute intracranial abnormality.  Chronic changes. Electronically Signed   By: Dorise Bullion III M.D   On: 04/01/2018 21:01   Ct Chest Wo Contrast  Result Date: 04/02/2018 CLINICAL DATA:  Shortness of breath. Attempted thoracentesis with inability to remove any fluid. EXAM: CT CHEST WITHOUT CONTRAST TECHNIQUE: Multidetector CT imaging of the chest was performed following the standard protocol without IV contrast. COMPARISON:  Chest x-ray from yesterday. FINDINGS: Cardiovascular: Normal heart size. No pericardial effusion. Normal caliber thoracic aorta. Coronary, aortic arch, and branch vessel atherosclerotic vascular disease. Mediastinum/Nodes: No enlarged mediastinal or axillary lymph nodes. Thyroid gland, trachea, and esophagus demonstrate no significant  findings. Lungs/Pleura: Moderate loculated left-sided pleural effusion with fluid extending into the major fissure. There is volume loss in the left hemithorax with narrowing of the lower lobe bronchi and near complete collapse of the left lower lobe. Patchy consolidation in the lingula. No pneumothorax. Trace right pleural effusion with mild right lower lobe atelectasis. Upper Abdomen: No acute abnormality. Prior partial gastrectomy. Stable simple cyst in the left liver. Musculoskeletal: No acute or significant osseous finding. IMPRESSION: 1. Volume loss in the left hemithorax with narrowing of the lower lobe bronchi and near complete collapse of the left lower lobe.  There is likely a component of underlying pneumonia within the left lower lobe as well. Patchy pneumonia within the lingula. An obstructing central hilar mass is not excluded. Consider bronchoscopy for further evaluation. Follow-up to resolution is recommended. 2. Moderate loculated left-sided pleural effusion. Empyema is not excluded. 3. Trace right pleural effusion with adjacent lower lobe atelectasis. 4.  Aortic atherosclerosis (ICD10-I70.0). Electronically Signed   By: Titus Dubin M.D.   On: 04/02/2018 16:55   Ct Chest W Contrast  Result Date: 04/07/2018 CLINICAL DATA:  Left pneumonia and empyema with limited thoracentesis demonstrating gram-negative rods in left pleural fluid. Evaluation by chest CT to determine approach for possible percutaneous pleural drainage catheter placement. EXAM: CT CHEST WITH CONTRAST TECHNIQUE: Multidetector CT imaging of the chest was performed during intravenous contrast administration. CONTRAST:  56 mL ISOVUE-300 IOPAMIDOL (ISOVUE-300) INJECTION 61% COMPARISON:  CT of the chest on 04/02/2018 FINDINGS: Cardiovascular: The heart size is stable and within normal limits. Stable coronary artery calcified plaque in a 3 vessel distribution. Trace pericardial fluid. Mediastinum/Nodes: No enlarged mediastinal or hilar  lymph nodes. Stable small mediastinal nodes. Lungs/Pleura: Aeration of the lingula has improved since the prior study. There remains dense consolidation of the left lower lobe with associated pleural effusion. Volume of pleural fluid in the upper hemithorax shows slight enlargement and is predominantly posterior. There is a small amount of fluid in the major fissure. Small loculation is seen laterally towards the base. There is an additional fairly stable basilar/subpulmonic component. No significant pleural enhancement or suggestion of any pleural masses by CT. No pneumothorax or discrete pulmonary nodules identified. Upper Abdomen: No acute abnormality. Musculoskeletal: No chest wall abnormality. No acute or significant osseous findings. IMPRESSION: Slight enlargement in loculated left pleural effusion along the posterior upper hemithorax compared to the prior CT. This would likely be the most appropriate area to target for percutaneous catheter drainage. This component also appears to be in communication with the fluid extending into the major fissure. The basilar/subpulmonic component and small lateral loculated component appears stable. Stable dense consolidation of the left lower lobe. Aeration of the lingula appears slightly improved since the prior CT. Electronically Signed   By: Aletta Edouard M.D.   On: 04/07/2018 08:21   US Renal  Result Date: 04/02/2018 CLINICAL DATA:  Acute kidney injury EXAM: RENAL / URINARY TRACT ULTRASOUND COMPLETE COMPARISON:  CT 09/20/2010 FINDINGS: Right Kidney: Length: 12.1 cm.  Slightly echogenic cortex.  No hydronephrosis. Left Kidney: Length: 10.2 cm. Slightly echogenic cortex. No hydronephrosis. Cyst in the midpole measuring 2.5 x 2.7 x 2.7 cm. Cyst in the upper pole measuring 1.6 x 1 x 1.3 cm. Bladder: Appears normal for degree of bladder distention. Small amount of ascites in the pelvis IMPRESSION: 1. Negative for hydronephrosis. Slightly echogenic cortex suggesting  medical renal disease 2. Cysts in the left kidney 3. Small amount of free fluid in the pelvis Electronically Signed   By: Donavan Foil M.D.   On: 04/02/2018 00:37   Dg Chest Port 1 View  Result Date: 04/07/2018 CLINICAL DATA:  84 year old female with history of pneumonia. EXAM: PORTABLE CHEST 1 VIEW COMPARISON:  Chest x-ray 04/05/2018. FINDINGS: Complete opacification throughout the mid to lower left hemithorax which may reflect atelectasis and/or consolidation with superimposed large left pleural effusion. Right lung appears clear. No right pleural effusion. No evidence of pulmonary edema. No pneumothorax. Heart size is borderline enlarged. Upper mediastinal contours are within normal limits. Aortic atherosclerosis. IMPRESSION: 1. Atelectasis and/or consolidation throughout the left lung  base with large left parapneumonic pleural effusion. 2. Aortic atherosclerosis. Electronically Signed   By: Vinnie Langton M.D.   On: 04/07/2018 09:09   Dg Chest Port 1 View  Result Date: 04/03/2018 CLINICAL DATA:  Respiratory failure EXAM: PORTABLE CHEST 1 VIEW COMPARISON:  04/03/2018 FINDINGS: Moderate to large left pleural effusion. Increased aeration of the left upper hemithorax. Right lung is clear. No pneumothorax. The heart is normal in size. Degenerative changes of the thoracic spine. IMPRESSION: Increased aeration of the left upper hemithorax. Moderate to large left pleural effusion. Electronically Signed   By: Julian Hy M.D.   On: 04/03/2018 18:29   Dg Chest Port 1 View  Result Date: 04/03/2018 CLINICAL DATA:  Community-acquired pneumonia EXAM: PORTABLE CHEST 1 VIEW COMPARISON:  CT scan chest of April 02, 2018 and chest x-ray of April 01, 2018 FINDINGS: There is now total opacification of the left hemithorax. There is no significant mediastinal shift. The right lung is well-expanded. There is hazy increased density at the right base however. The heart borders are obscured. The pulmonary vascularity is  not engorged. There is calcification in the wall of the aortic arch. IMPRESSION: Complete opacification of the left hemithorax consistent with further lung atelectasis and pleural fluid accumulation. Right basilar atelectasis or pneumonia. Thoracic aortic atherosclerosis. Electronically Signed   By: David  Martinique M.D.   On: 04/03/2018 09:43   Dg Chest Port 1 View  Result Date: 04/01/2018 CLINICAL DATA:  Cough and hypotension EXAM: PORTABLE CHEST 1 VIEW COMPARISON:  03/17/2018 FINDINGS: Moderate to large left pleural effusion. Atelectasis or pneumonia at the lingula and left base. Cardiomegaly with vascular calcification. No pneumothorax. IMPRESSION: Moderate to large left pleural effusion with atelectasis or pneumonia at the lingula and left base. Electronically Signed   By: Donavan Foil M.D.   On: 04/01/2018 19:48   Dg Chest Port 1v Same Day  Result Date: 04/05/2018 CLINICAL DATA:  Pneumonia. EXAM: PORTABLE CHEST 1 VIEW COMPARISON:  Radiograph of Apr 03, 2018. FINDINGS: No pneumothorax is noted. Atherosclerosis of thoracic aorta is noted. Mild right basilar subsegmental atelectasis is noted. Large left pleural effusion is again noted and increased compared to prior exam, with significantly decreased aeration of left upper lobe compared to prior exam. Bony thorax is unremarkable. IMPRESSION: Increased left pleural effusion is noted compared to prior exam, with significantly decreased aeration of left upper lobe compared to prior exam as well. This may be due to pneumonia or atelectasis. Mild right basilar subsegmental atelectasis is noted. Electronically Signed   By: Marijo Conception, M.D.   On: 04/05/2018 07:20   Ir Thoracentesis Asp Pleural Space W/img Guide  Result Date: 04/05/2018 CLINICAL DATA:  Left lung pneumonia with associated pleural effusion. EXAM: ULTRASOUND GUIDED LEFT THORACENTESIS COMPARISON:  CT of the chest on 04/02/2018 as well as multiple recent chest x-rays. PROCEDURE: An ultrasound  guided thoracentesis was thoroughly discussed with the patient and questions answered. The benefits, risks, alternatives and complications were also discussed. The patient understands and wishes to proceed with the procedure. Written consent was obtained. Ultrasound was performed to localize and mark an adequate pocket of fluid in the left chest. The area was then prepped and draped in the normal sterile fashion. 1% Lidocaine was used for local anesthesia. Under ultrasound guidance a 6 French Safe-T-Centesis catheter was introduced. Thoracentesis was performed. The catheter was removed and a dressing applied. COMPLICATIONS: None FINDINGS: Ultrasound shows a complex left pleural effusion with posterior pocket visualized by ultrasound. After advancement of  a catheter into this pocket, there was initial return of fairly thin, yellowish fluid. This turned into more overtly purulent fluid towards the end of aspiration with ability to remove only 90 mL of fluid from the single pleural access. Fluid samples were sent for requested laboratory tests. IMPRESSION: Complex left pleural effusion. 90 mL of fluid was able to be aspirated from a posterior pocket. Nature of fluid changed from initially clear appearing fluid to more overtly purulent fluid during aspiration. Electronically Signed   By: Aletta Edouard M.D.   On: 04/05/2018 18:02    Labs:  CBC: Recent Labs    04/03/18 0340 04/04/18 0336 04/04/18 1022 04/07/18 0407  WBC 22.4* 21.8* 23.5* 12.5*  HGB 8.6* 8.7* 9.6* 7.9*  HCT 25.2* 26.6* 29.1* 24.8*  PLT 268 309 327 439*    COAGS: Recent Labs    04/01/18 1928  INR 1.36  APTT 39*    BMP: Recent Labs    04/04/18 0336 04/04/18 1022 04/06/18 1141 04/07/18 0407  NA 140 140 140 143  K 3.9 4.3 3.6 3.4*  CL 113* 110 111 111  CO2 18* 19* 17* 22  GLUCOSE 110* 97 135* 142*  BUN 22* 21* 13 13  CALCIUM 8.4* 8.8* 9.1 8.7*  CREATININE 1.17* 1.22* 1.01* 0.90  GFRNONAA 40* 38* 48* 55*  GFRAA 47*  44* 56* >60    LIVER FUNCTION TESTS: Recent Labs    04/01/18 1928  BILITOT 1.5*  AST 29  ALT 16  ALKPHOS 106  PROT 6.9  ALBUMIN 2.3*    TUMOR MARKERS: No results for input(s): AFPTM, CEA, CA199, CHROMGRNA in the last 8760 hours.  Assessment and Plan:  Empyema  Will proceed with image guided placement of pigtail chest catheter today by Dr. Kathlene Cote.  Risks and benefits discussed with the patient including bleeding, infection, damage to adjacent structures, and sepsis.  All of the patient's questions were answered, patient is agreeable to proceed. Consent signed and in chart.  Thank you for this interesting consult.  I greatly enjoyed meeting Monique Lin and look forward to participating in their care.  A copy of this report was sent to the requesting provider on this date.  Electronically Signed: Murrell Redden, PA-C   04/07/2018, 9:20 AM      I spent a total of  25 Minutes in face to face in clinical consultation, greater than 50% of which was counseling/coordinating care for pigtail chest catheter.

## 2018-04-08 ENCOUNTER — Inpatient Hospital Stay (HOSPITAL_COMMUNITY): Payer: Medicare Other

## 2018-04-08 DIAGNOSIS — J869 Pyothorax without fistula: Secondary | ICD-10-CM

## 2018-04-08 DIAGNOSIS — J90 Pleural effusion, not elsewhere classified: Secondary | ICD-10-CM

## 2018-04-08 DIAGNOSIS — J189 Pneumonia, unspecified organism: Secondary | ICD-10-CM

## 2018-04-08 LAB — PH, BODY FLUID: pH, Body Fluid: 6.6

## 2018-04-08 LAB — CREATININE, SERUM: Creatinine, Ser: 0.82 mg/dL (ref 0.44–1.00)

## 2018-04-08 MED ORDER — STERILE WATER FOR INJECTION IJ SOLN
5.0000 mg | Freq: Every day | RESPIRATORY_TRACT | Status: DC
  Administered 2018-04-09: 5 mg via INTRAPLEURAL
  Filled 2018-04-08 (×3): qty 5

## 2018-04-08 MED ORDER — SODIUM CHLORIDE 0.9 % IJ SOLN
10.0000 mg | Freq: Two times a day (BID) | INTRAMUSCULAR | Status: DC
  Filled 2018-04-08: qty 10

## 2018-04-08 MED ORDER — STERILE WATER FOR INJECTION IJ SOLN
5.0000 mg | Freq: Two times a day (BID) | RESPIRATORY_TRACT | Status: DC
  Filled 2018-04-08: qty 5

## 2018-04-08 MED ORDER — SODIUM CHLORIDE 0.9 % IJ SOLN
10.0000 mg | Freq: Every day | INTRAMUSCULAR | Status: DC
  Administered 2018-04-09: 10 mg via INTRAPLEURAL
  Filled 2018-04-08 (×3): qty 10

## 2018-04-08 NOTE — Plan of Care (Signed)
°  Problem: Clinical Measurements: °Goal: Will remain free from infection °Outcome: Progressing °Goal: Respiratory complications will improve °Outcome: Progressing °  °

## 2018-04-08 NOTE — Progress Notes (Signed)
Subjective:  Events of weekend noted patient denies any chest pain states breathing has improved patient underwent CT-guided left pleural drain catheter placement.pleural fluid positive for gram-negative rods final culture pending  Objective:  Vital Signs in the last 24 hours: Temp:  [98.1 F (36.7 C)-99.4 F (37.4 C)] 98.5 F (36.9 C) (05/06 0354) Pulse Rate:  [81-97] 86 (05/06 0354) Resp:  [18-20] 18 (05/05 1652) BP: (121-175)/(57-84) 144/66 (05/06 0354) SpO2:  [95 %-100 %] 97 % (05/06 0911) Weight:  [91.4 kg (201 lb 8 oz)] 91.4 kg (201 lb 8 oz) (05/06 1007)  Intake/Output from previous day: 05/05 0701 - 05/06 0700 In: 1481.5 [P.O.:240; I.V.:1141.5; IV Piggyback:100] Out: 1530 [Urine:1100; Chest Tube:430] Intake/Output from this shift: Total I/O In: -  Out: 100 [Urine:100]  Physical Exam: Neck: no adenopathy, no carotid bruit, no JVD and supple, symmetrical, trachea midline Lungs: Decrease stress on her left bases air entry has improved with rhonchi noted Heart: regular rate and rhythm, S1, S2 normal and Soft systolic murmur noted Abdomen: soft, non-tender; bowel sounds normal; no masses,  no organomegaly Extremities: extremities normal, atraumatic, no cyanosis or edema  Lab Results: Recent Labs    04/07/18 0407  WBC 12.5*  HGB 7.9*  PLT 439*   Recent Labs    04/06/18 1141 04/07/18 0407 04/08/18 0435  NA 140 143  --   K 3.6 3.4*  --   CL 111 111  --   CO2 17* 22  --   GLUCOSE 135* 142*  --   BUN 13 13  --   CREATININE 1.01* 0.90 0.82   No results for input(s): TROPONINI in the last 72 hours.  Invalid input(s): CK, MB Hepatic Function Panel No results for input(s): PROT, ALBUMIN, AST, ALT, ALKPHOS, BILITOT, BILIDIR, IBILI in the last 72 hours. No results for input(s): CHOL in the last 72 hours. No results for input(s): PROTIME in the last 72 hours.  Imaging: Imaging results have been reviewed and Ct Chest W Contrast  Result Date: 04/07/2018 CLINICAL  DATA:  Left pneumonia and empyema with limited thoracentesis demonstrating gram-negative rods in left pleural fluid. Evaluation by chest CT to determine approach for possible percutaneous pleural drainage catheter placement. EXAM: CT CHEST WITH CONTRAST TECHNIQUE: Multidetector CT imaging of the chest was performed during intravenous contrast administration. CONTRAST:  56 mL ISOVUE-300 IOPAMIDOL (ISOVUE-300) INJECTION 61% COMPARISON:  CT of the chest on 04/02/2018 FINDINGS: Cardiovascular: The heart size is stable and within normal limits. Stable coronary artery calcified plaque in a 3 vessel distribution. Trace pericardial fluid. Mediastinum/Nodes: No enlarged mediastinal or hilar lymph nodes. Stable small mediastinal nodes. Lungs/Pleura: Aeration of the lingula has improved since the prior study. There remains dense consolidation of the left lower lobe with associated pleural effusion. Volume of pleural fluid in the upper hemithorax shows slight enlargement and is predominantly posterior. There is a small amount of fluid in the major fissure. Small loculation is seen laterally towards the base. There is an additional fairly stable basilar/subpulmonic component. No significant pleural enhancement or suggestion of any pleural masses by CT. No pneumothorax or discrete pulmonary nodules identified. Upper Abdomen: No acute abnormality. Musculoskeletal: No chest wall abnormality. No acute or significant osseous findings. IMPRESSION: Slight enlargement in loculated left pleural effusion along the posterior upper hemithorax compared to the prior CT. This would likely be the most appropriate area to target for percutaneous catheter drainage. This component also appears to be in communication with the fluid extending into the major fissure. The basilar/subpulmonic  component and small lateral loculated component appears stable. Stable dense consolidation of the left lower lobe. Aeration of the lingula appears slightly  improved since the prior CT. Electronically Signed   By: Aletta Edouard M.D.   On: 04/07/2018 08:21   Dg Chest Port 1 View  Result Date: 04/08/2018 CLINICAL DATA:  Follow-up left-sided empyema with chest tube treatment. EXAM: PORTABLE CHEST 1 VIEW COMPARISON:  Chest x-ray of Apr 07, 2018 FINDINGS: A small caliber chest tube has been placed on the left. The tip projects just above the aortic arch. There is a less than 5% left apical pneumothorax. There is persistent increased density at the left lung base with obscuration of the left hemidiaphragm. There is increased density in the right infrahilar region compatible with pneumonia and small effusion. There is calcification in the wall of the aortic arch. The trachea is midline. The left heart border is partially obscured. The pulmonary vascularity is not clearly engorged. IMPRESSION: Persistent left basilar atelectasis or pneumonia with left pleural effusion-empyema. Newly placed left sided chest tube. Less than 5% left apical pneumothorax appears new. Increasing density at the right lung base worrisome for atelectasis or pneumonia. Thoracic aortic atherosclerosis. Electronically Signed   By: David  Martinique M.D.   On: 04/08/2018 09:38   Dg Chest Port 1 View  Result Date: 04/07/2018 CLINICAL DATA:  84 year old female with history of pneumonia. EXAM: PORTABLE CHEST 1 VIEW COMPARISON:  Chest x-ray 04/05/2018. FINDINGS: Complete opacification throughout the mid to lower left hemithorax which may reflect atelectasis and/or consolidation with superimposed large left pleural effusion. Right lung appears clear. No right pleural effusion. No evidence of pulmonary edema. No pneumothorax. Heart size is borderline enlarged. Upper mediastinal contours are within normal limits. Aortic atherosclerosis. IMPRESSION: 1. Atelectasis and/or consolidation throughout the left lung base with large left parapneumonic pleural effusion. 2. Aortic atherosclerosis. Electronically Signed    By: Vinnie Langton M.D.   On: 04/07/2018 09:09   Ct Image Guided Drainage By Percutaneous Catheter  Result Date: 04/07/2018 CLINICAL DATA:  Left lung pneumonia with associated empyema. EXAM: CT GUIDED CATHETER DRAINAGE OF LEFT PLEURAL EMPYEMA ANESTHESIA/SEDATION: 1.0 mg IV Versed 25 mcg IV Fentanyl Total Moderate Sedation Time:  22 minutes The patient's level of consciousness and physiologic status were continuously monitored during the procedure by Radiology nursing. PROCEDURE: The procedure, risks, benefits, and alternatives were explained to the patient's daughter. Questions regarding the procedure were encouraged and answered. The patient's daughter understands and consents to the procedure. A time out was performed prior to initiating the procedure. CT was performed in prone followed by decubitus positions with the left side up. The posterior left chest wall was prepped with chlorhexidine in a sterile fashion, and a sterile drape was applied covering the operative field. A sterile gown and sterile gloves were used for the procedure. Local anesthesia was provided with 1% Lidocaine. An 18 gauge trocar needle was advanced into the posterior left pleural space under CT guidance. Fluid was aspirated. A guidewire was advanced through the needle. The needle was removed. The tract was dilated and a 12 French pigtail drainage catheter advanced into the pleural space. Catheter positioning was confirmed by CT. The catheter was connected to a [employer] device. The catheter exit site was secured with a Prolene retention suture and Vaseline gauze dressing. COMPLICATIONS: None FINDINGS: Initial prone imaging showed movement of pleural fluid anteriorly in the left pleural space. From a decubitus position, the pleural fluid was able to be accessed to allow pleural  drainage catheter placement. There was return of amber colored fluid. IMPRESSION: CT-guided placement of left-sided 12 French pleural drain from a  posterior approach into a pleural empyema. The drain was connected to a Pleur-evac device which will be connected to wall suction at -20 cm of water. Electronically Signed   By: Aletta Edouard M.D.   On: 04/07/2018 13:43    Cardiac Studies:  Assessment/Plan:  Left lung pneumonia complicated by empyema   Status post bronchial noted to have bronchomalacia. Status post CT-guided drain left pleural space Status post septic shock. 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

## 2018-04-08 NOTE — Progress Notes (Signed)
PCCM Interval Progress Note  Intrapleural tPA administered through chest tube at 1215pm and stop cock closed. Intrapleural pulmozyme later administered through tube at 1320 and stop cock closed.  Will plan for 1 hour dwell time.  RN asked to open stop cock at 1430.   Montey Hora, [facility] Pager: [phone]  or [phone] 04/08/2018, 1:23 PM

## 2018-04-08 NOTE — Progress Notes (Signed)
Referring Physician(s): Dr Vilinda Boehringer  Supervising Physician: Sandi Mariscal  Patient Status:  [facility] - In-pt  Chief Complaint:  Left empyema  Subjective:  Left chest tube drain placed 5/5 Draining well 450 cc in Pleurvac now Serous output  Feels some better today  Allergies: Codeine and Penicillins  Medications: Prior to Admission medications   Medication Sig Start Date End Date Taking? Authorizing Provider  albuterol (PROVENTIL HFA;VENTOLIN HFA) 108 (90 BASE) MCG/ACT inhaler Inhale 2 puffs into the lungs every 6 (six) hours as needed for wheezing or shortness of breath.    Yes [provider]  albuterol (PROVENTIL) (2.5 MG/3ML) 0.083% nebulizer solution Take 2.5 mg by nebulization every 6 (six) hours as needed for wheezing.   Yes [provider]  amLODipine (NORVASC) 5 MG tablet Take 5 mg by mouth daily.   Yes [provider]  aspirin EC 81 MG tablet Take 81 mg by mouth daily.   Yes [provider]  atorvastatin (LIPITOR) 20 MG tablet Take 20 mg by mouth daily.   Yes [provider]  carvedilol (COREG) 25 MG tablet Take 25 mg by mouth 2 (two) times daily with a meal.   Yes [provider]  Cholecalciferol (VITAMIN D-3 PO) Take 1 capsule by mouth daily.   Yes [provider]  Cyanocobalamin (VITAMIN B-12 PO) Take 500 mcg by mouth daily.    Yes [provider]  doxazosin (CARDURA) 4 MG tablet Take 4 mg by mouth at bedtime.    Yes [provider]  fluticasone (FLONASE) 50 MCG/ACT nasal spray Place 2 sprays into the nose daily as needed for allergies or rhinitis.    Yes [provider]  FOLIC ACID PO Take 1 tablet by mouth daily.    Yes [provider]  gabapentin (NEURONTIN) 300 MG capsule Take 300 mg by mouth 3 (three) times daily.    Yes [provider]  levothyroxine (SYNTHROID, LEVOTHROID) 50 MCG tablet Take 50 mcg by mouth daily.   Yes [provider]    Liniments (SALONPAS PAIN RELIEF PATCH EX) Apply 1 patch topically daily as needed (pain).   Yes [provider]  losartan-hydrochlorothiazide (HYZAAR) 50-12.5 MG per tablet Take 1 tablet by mouth daily.   Yes [provider]  traMADol (ULTRAM) 50 MG tablet Take 1 tablet (50 mg total) by mouth every 6 (six) hours as needed. Patient taking differently: Take 50 mg by mouth See admin instructions. Take 50 mg by mouth once a day as needed for pain and 50 mg at bedtime scheduled 03/17/18  Yes Daleen Bo, MD  trolamine salicylate (ASPERCREME) 10 % cream Apply 1 application topically as needed (pain).   Yes [provider]  cefdinir (OMNICEF) 300 MG capsule Take 1 capsule (300 mg total) by mouth 2 (two) times daily. Patient not taking: Reported on 04/01/2018 05/17/13   Orpah Greek, MD     Vital Signs: BP (!) 144/66 (BP Location: Right Arm)   Pulse 86   Temp 98.5 F (36.9 C) (Oral)   Resp 18   Ht 5' 2.5" (1.588 m)   Wt 195 lb 12.3 oz (88.8 kg)   SpO2 100%   BMI 35.24 kg/m   Physical Exam  Pulmonary/Chest: No respiratory distress.  Decreased breath sounds on left  Musculoskeletal: Normal range of motion.  Neurological: She is alert.  Skin: Skin is warm and dry.  Site of drain is clean and dry NT no bleeding OP 450 cc in  Pleurvac serous OP  No air leak  Psychiatric: She has a normal mood and affect.  Nursing note and vitals reviewed.  CXR done this am-- pending read   Imaging: Dg Chest 1 View  Result Date: 04/05/2018 CLINICAL DATA:  84 year old female status post ultrasound-guided left side thoracentesis this afternoon. EXAM: CHEST  1 VIEW COMPARISON:  Thoracentesis ultrasound images 1636 hours today. Portable chest 0450 hours today, and earlier. FINDINGS: Portable AP semi upright view at 1656 hours. Reduced left pleural effusion volume and improved left upper lung ventilation following thoracentesis. No pneumothorax identified. Continued  moderate opacification of the left mid and lower lung, with evidence of some left lung volume loss resulting in leftward shift of the mediastinum as seen on the CT 04/02/2018. Calcified aortic atherosclerosis. Visible mediastinal contours are within normal limits. Allowing for portable technique the right lung remains clear. IMPRESSION: 1. Regressed left pleural fluid and improved left upper lung ventilation following thoracentesis with no pneumothorax or adverse features. 2. Continued combination of left lower lung opacification and volume loss. 3. No new cardiopulmonary abnormality identified. Electronically Signed   By: Genevie Ann M.D.   On: 04/05/2018 17:26   Ct Chest W Contrast  Result Date: 04/07/2018 CLINICAL DATA:  Left pneumonia and empyema with limited thoracentesis demonstrating gram-negative rods in left pleural fluid. Evaluation by chest CT to determine approach for possible percutaneous pleural drainage catheter placement. EXAM: CT CHEST WITH CONTRAST TECHNIQUE: Multidetector CT imaging of the chest was performed during intravenous contrast administration. CONTRAST:  56 mL ISOVUE-300 IOPAMIDOL (ISOVUE-300) INJECTION 61% COMPARISON:  CT of the chest on 04/02/2018 FINDINGS: Cardiovascular: The heart size is stable and within normal limits. Stable coronary artery calcified plaque in a 3 vessel distribution. Trace pericardial fluid. Mediastinum/Nodes: No enlarged mediastinal or hilar lymph nodes. Stable small mediastinal nodes. Lungs/Pleura: Aeration of the lingula has improved since the prior study. There remains dense consolidation of the left lower lobe with associated pleural effusion. Volume of pleural fluid in the upper hemithorax shows slight enlargement and is predominantly posterior. There is a small amount of fluid in the major fissure. Small loculation is seen laterally towards the base. There is an additional fairly stable basilar/subpulmonic component. No significant pleural enhancement or  suggestion of any pleural masses by CT. No pneumothorax or discrete pulmonary nodules identified. Upper Abdomen: No acute abnormality. Musculoskeletal: No chest wall abnormality. No acute or significant osseous findings. IMPRESSION: Slight enlargement in loculated left pleural effusion along the posterior upper hemithorax compared to the prior CT. This would likely be the most appropriate area to target for percutaneous catheter drainage. This component also appears to be in communication with the fluid extending into the major fissure. The basilar/subpulmonic component and small lateral loculated component appears stable. Stable dense consolidation of the left lower lobe. Aeration of the lingula appears slightly improved since the prior CT. Electronically Signed   By: Aletta Edouard M.D.   On: 04/07/2018 08:21   Dg Chest Port 1 View  Result Date: 04/07/2018 CLINICAL DATA:  84 year old female with history of pneumonia. EXAM: PORTABLE CHEST 1 VIEW COMPARISON:  Chest x-ray 04/05/2018. FINDINGS: Complete opacification throughout the mid to lower left hemithorax which may reflect atelectasis and/or consolidation with superimposed large left pleural effusion. Right lung appears clear. No right pleural effusion. No evidence of pulmonary edema. No pneumothorax. Heart size is borderline enlarged. Upper mediastinal contours are within normal limits. Aortic atherosclerosis. IMPRESSION: 1. Atelectasis and/or consolidation throughout the left lung base with large left parapneumonic  pleural effusion. 2. Aortic atherosclerosis. Electronically Signed   By: Vinnie Langton M.D.   On: 04/07/2018 09:09   Dg Chest Port 1v Same Day  Result Date: 04/05/2018 CLINICAL DATA:  Pneumonia. EXAM: PORTABLE CHEST 1 VIEW COMPARISON:  Radiograph of Apr 03, 2018. FINDINGS: No pneumothorax is noted. Atherosclerosis of thoracic aorta is noted. Mild right basilar subsegmental atelectasis is noted. Large left pleural effusion is again noted and  increased compared to prior exam, with significantly decreased aeration of left upper lobe compared to prior exam. Bony thorax is unremarkable. IMPRESSION: Increased left pleural effusion is noted compared to prior exam, with significantly decreased aeration of left upper lobe compared to prior exam as well. This may be due to pneumonia or atelectasis. Mild right basilar subsegmental atelectasis is noted. Electronically Signed   By: Marijo Conception, M.D.   On: 04/05/2018 07:20   Ct Image Guided Drainage By Percutaneous Catheter  Result Date: 04/07/2018 CLINICAL DATA:  Left lung pneumonia with associated empyema. EXAM: CT GUIDED CATHETER DRAINAGE OF LEFT PLEURAL EMPYEMA ANESTHESIA/SEDATION: 1.0 mg IV Versed 25 mcg IV Fentanyl Total Moderate Sedation Time:  22 minutes The patient's level of consciousness and physiologic status were continuously monitored during the procedure by Radiology nursing. PROCEDURE: The procedure, risks, benefits, and alternatives were explained to the patient's daughter. Questions regarding the procedure were encouraged and answered. The patient's daughter understands and consents to the procedure. A time out was performed prior to initiating the procedure. CT was performed in prone followed by decubitus positions with the left side up. The posterior left chest wall was prepped with chlorhexidine in a sterile fashion, and a sterile drape was applied covering the operative field. A sterile gown and sterile gloves were used for the procedure. Local anesthesia was provided with 1% Lidocaine. An 18 gauge trocar needle was advanced into the posterior left pleural space under CT guidance. Fluid was aspirated. A guidewire was advanced through the needle. The needle was removed. The tract was dilated and a 12 French pigtail drainage catheter advanced into the pleural space. Catheter positioning was confirmed by CT. The catheter was connected to a [employer] device. The catheter exit site  was secured with a Prolene retention suture and Vaseline gauze dressing. COMPLICATIONS: None FINDINGS: Initial prone imaging showed movement of pleural fluid anteriorly in the left pleural space. From a decubitus position, the pleural fluid was able to be accessed to allow pleural drainage catheter placement. There was return of amber colored fluid. IMPRESSION: CT-guided placement of left-sided 12 French pleural drain from a posterior approach into a pleural empyema. The drain was connected to a Pleur-evac device which will be connected to wall suction at -20 cm of water. Electronically Signed   By: Aletta Edouard M.D.   On: 04/07/2018 13:43   Ir Thoracentesis Asp Pleural Space W/img Guide  Result Date: 04/05/2018 CLINICAL DATA:  Left lung pneumonia with associated pleural effusion. EXAM: ULTRASOUND GUIDED LEFT THORACENTESIS COMPARISON:  CT of the chest on 04/02/2018 as well as multiple recent chest x-rays. PROCEDURE: An ultrasound guided thoracentesis was thoroughly discussed with the patient and questions answered. The benefits, risks, alternatives and complications were also discussed. The patient understands and wishes to proceed with the procedure. Written consent was obtained. Ultrasound was performed to localize and mark an adequate pocket of fluid in the left chest. The area was then prepped and draped in the normal sterile fashion. 1% Lidocaine was used for local anesthesia. Under ultrasound guidance a 6 [place]  Safe-T-Centesis catheter was introduced. Thoracentesis was performed. The catheter was removed and a dressing applied. COMPLICATIONS: None FINDINGS: Ultrasound shows a complex left pleural effusion with posterior pocket visualized by ultrasound. After advancement of a catheter into this pocket, there was initial return of fairly thin, yellowish fluid. This turned into more overtly purulent fluid towards the end of aspiration with ability to remove only 90 mL of fluid from the single pleural  access. Fluid samples were sent for requested laboratory tests. IMPRESSION: Complex left pleural effusion. 90 mL of fluid was able to be aspirated from a posterior pocket. Nature of fluid changed from initially clear appearing fluid to more overtly purulent fluid during aspiration. Electronically Signed   By: Aletta Edouard M.D.   On: 04/05/2018 18:02    Labs:  CBC: Recent Labs    04/03/18 0340 04/04/18 0336 04/04/18 1022 04/07/18 0407  WBC 22.4* 21.8* 23.5* 12.5*  HGB 8.6* 8.7* 9.6* 7.9*  HCT 25.2* 26.6* 29.1* 24.8*  PLT 268 309 327 439*    COAGS: Recent Labs    04/01/18 1928  INR 1.36  APTT 39*    BMP: Recent Labs    04/04/18 0336 04/04/18 1022 04/06/18 1141 04/07/18 0407 04/08/18 0435  NA 140 140 140 143  --   K 3.9 4.3 3.6 3.4*  --   CL 113* 110 111 111  --   CO2 18* 19* 17* 22  --   GLUCOSE 110* 97 135* 142*  --   BUN 22* 21* 13 13  --   CALCIUM 8.4* 8.8* 9.1 8.7*  --   CREATININE 1.17* 1.22* 1.01* 0.90 0.82  GFRNONAA 40* 38* 48* 55* >60  GFRAA 47* 44* 56* >60 >60    LIVER FUNCTION TESTS: Recent Labs    04/01/18 1928  BILITOT 1.5*  AST 29  ALT 16  ALKPHOS 106  PROT 6.9  ALBUMIN 2.3*    Assessment and Plan:  Left empyema Wbc down No airleak at chest tube OP 450 cc in Pleurvac Feeling better Will follow  Electronically Signed: Babs Dabbs A, PA-C 04/08/2018, 8:09 AM   I spent a total of 15 Minutes at the the patient's bedside AND on the patient's hospital floor or unit, greater than 50% of which was counseling/coordinating care for left empyema drain

## 2018-04-08 NOTE — Care Management Important Message (Signed)
Important Message  Patient Details  Name: Monique Lin MRN: [id number] Date of Birth: 05-04-1927   Medicare Important Message Given:  Yes    Zannie Locastro 04/08/2018, 11:56 AM

## 2018-04-08 NOTE — Progress Notes (Addendum)
Physical Therapy Treatment Patient Details Name: Monique Lin MRN: [id number] DOB: 1927/08/29 Today's Date: 04/08/2018    History of Present Illness 84 y/o F admitted 4/29 with 2 week hx of progressive SOB.  Seen 4/21 and treated for bronchitis with prednisone + antihistamines without improvement. CXR clear at that time.  Returned 4/29 with generalized weakness, decreased appetite and AMS.  Work up concerning for LLL opacity with associated pleural effusion & hypotension requiring vasopressors.  CXR has had intermittent white out on left despite bronchoscopy and attempts at thoracentesis (limited fluid returned).  Bronchoscopy demonstrated bronchomalacia    PT Comments    Pt performed transfer from bed to recliner chair with max VCs for encouragement.  Pt initially resistant to moving into chair but after education and encouragement from daughter pt is agreeable.  Educated patient and daughter to sit up x2 hours and to use IS/flutter everyhour to improve productions when coughing.  Plan remains safest for short term rehab at SNF to improve strength and function.  Pt remains to want to return home.     Follow Up Recommendations  SNF;Supervision/Assistance - 24 hour     Equipment Recommendations  Rolling walker with 5" wheels;3in1 (PT)    Recommendations for Other Services       Precautions / Restrictions Precautions Precautions: Fall Restrictions Weight Bearing Restrictions: No    Mobility  Bed Mobility Overal bed mobility: Needs Assistance Bed Mobility: Supine to Sit     Supine to sit: Max assist;+2 for physical assistance     General bed mobility comments: Pt required assistance to advance B LEs, use of chux pad to scoot hips and assistance to elevate trunk into sitting edge of bed.    Transfers Overall transfer level: Needs assistance Equipment used: Rolling walker (2 wheeled) Transfers: Sit to/from Stand Sit to Stand: Mod assist;+2 physical assistance Stand pivot  transfers: Mod assist;+2 physical assistance       General transfer comment: Pt required cues for hand placement and increased time to follow these commands.  Pt required assistance to power up into standing, to turn RW and return to seated position.  Pt with poor hand placement returning to seated surface.    Ambulation/Gait Ambulation/Gait assistance: Mod assist;+2 physical assistance Ambulation Distance (Feet): 6 Feet Assistive device: Rolling walker (2 wheeled) Gait Pattern/deviations: Step-to pattern;Trunk flexed(Pt with seriers of forwad and side steps to turn and sit in recliner chair.  )        Stairs             Wheelchair Mobility    Modified Rankin (Stroke Patients Only)       Balance Overall balance assessment: Needs assistance   Sitting balance-Leahy Scale: Fair       Standing balance-Leahy Scale: Poor                              Cognition Arousal/Alertness: Awake/alert Behavior During Therapy: WFL for tasks assessed/performed Overall Cognitive Status: Difficult to assess Area of Impairment: Attention;Following commands;Safety/judgement;Problem solving                   Current Attention Level: Selective   Following Commands: Follows one step commands with increased time Safety/Judgement: Decreased awareness of safety   Problem Solving: Slow processing General Comments: Pt requiring increased time to follow commands throughout session today.      Exercises      General Comments  Pertinent Vitals/Pain Pain Assessment: No/denies pain    Home Living                      Prior Function            PT Goals (current goals can now be found in the care plan section) Acute Rehab PT Goals Patient Stated Goal: to take it slow and recover Potential to Achieve Goals: Fair Progress towards PT goals: Progressing toward goals    Frequency    Min 3X/week(Family hoping to progress to HHPT.  )      PT  Plan Current plan remains appropriate    Co-evaluation              AM-PAC PT "6 Clicks" Daily Activity  Outcome Measure  Difficulty turning over in bed (including adjusting bedclothes, sheets and blankets)?: Unable Difficulty moving from lying on back to sitting on the side of the bed? : Unable Difficulty sitting down on and standing up from a chair with arms (e.g., wheelchair, bedside commode, etc,.)?: Unable Help needed moving to and from a bed to chair (including a wheelchair)?: A Lot Help needed walking in hospital room?: A Lot Help needed climbing 3-5 steps with a railing? : Total 6 Click Score: 8    End of Session Equipment Utilized During Treatment: Gait belt;Oxygen Activity Tolerance: Patient limited by fatigue Patient left: in chair;with call bell/phone within reach;with nursing/sitter in room;with family/visitor present Nurse Communication: Mobility status PT Visit Diagnosis: Unsteadiness on feet (R26.81);History of falling (Z91.81);Muscle weakness (generalized) (M62.81)     Time: 4098-1191 PT Time Calculation (min) (ACUTE ONLY): 24 min  Charges:  $Therapeutic Activity: 23-37 mins                    G CodesGovernor Lin, PTA pager [phone]    Monique Lin 04/08/2018, 1:19 PM

## 2018-04-08 NOTE — Progress Notes (Signed)
[place] Progress Note    Brief Summary: 84 y/o F admitted 4/29 with 2 week hx of progressive SOB.  Seen 4/21 and treated for bronchitis with prednisone + antihistamines without improvement. CXR clear at that time.  Returned 4/29 with generalized weakness, decreased appetite and AMS.  Work up concerning for LLL opacity with associated pleural effusion & hypotension requiring vasopressors.  CXR has had intermittent white out on left despite bronchoscopy and attempts at thoracentesis (limited fluid returned).  Bronchoscopy demonstrated bronchomalacia.     S:  Chest tube placed 5/5, had 430cc drainage overnight.  tPA just instilled in tube at 1215 today. Plan for pulmozyme at 1315.  O: Blood pressure (!) 145/66, pulse 85, temperature 99.4 F (37.4 C), temperature source Oral, resp. rate 16, height 5' 2.5" (1.588 m), weight 195 lb 12.3 oz (88.8 kg), SpO2 100 %.  General:  Elderly female, resting in bed, in NAD HEENT: MM pink/moist Neuro: AAOx4, speech clear, MAE  CV: s1s2 rrr, no m/r/g PULM: even/non-labored, clear on R, diminished on L DV:VOHY, non-tender, bsx4 active  Extremities: warm/dry, generalized 2+ edema  Skin: no rashes or lesions   CBC Latest Ref Rng & Units 04/07/2018 04/04/2018 04/04/2018  WBC 4.0 - 10.5 K/uL 12.5(H) 23.5(H) 21.8(H)  Hemoglobin 12.0 - 15.0 g/dL 7.9(L) 9.6(L) 8.7(L)  Hematocrit 36.0 - 46.0 % 24.8(L) 29.1(L) 26.6(L)  Platelets 150 - 400 K/uL 439(H) 327 309    BMP Latest Ref Rng & Units 04/08/2018 04/07/2018 04/06/2018  Glucose 65 - 99 mg/dL - 142(H) 135(H)  BUN 6 - 20 mg/dL - 13 13  Creatinine 0.44 - 1.00 mg/dL 0.82 0.90 1.01(H)  Sodium 135 - 145 mmol/L - 143 140  Potassium 3.5 - 5.1 mmol/L - 3.4(L) 3.6  Chloride 101 - 111 mmol/L - 111 111  CO2 22 - 32 mmol/L - 22 17(L)  Calcium 8.9 - 10.3 mg/dL - 8.7(L) 9.1   CXR 5/3 - slight improvement in LLL effusion   Antibiotics Rocephin 4/29 >>  Azithro 4/29 >> 4/30  Cultures Pleural fluid 5/3 >> rare WBC, moderate  GNR >>  BCx2 4/29 >> negative  BCx2 4/29 >>   A: LLL PNA  Concern for LLL Empyema - GNR on gram stain of pleural fluid  Bronchomalacia  Left Pleural Effusion  Septic Shock - resolved  P: tPA instilled in chest tube at 1215, plan for pulmozyme at 1315.  Then plan 1hr dwell time before resuming suction on chest tube.  Will plan for daily dosing x 3 days vs q12. Mobilize - OOB / PT efforts D8/x abx.  Await pleural cultures - Continue rocephin.  Follow cultures   Continue mucinex BID  Follow intermittent CXR    Global - rest per primary MD.     Montey Hora, PA - C [facility] Pager: [phone]  or [phone] 04/08/2018, 12:16 PM

## 2018-04-08 NOTE — Evaluation (Signed)
Occupational Therapy Evaluation Patient Details Name: Monique Lin MRN: [id number] DOB: [birth year] Today's Date: 04/08/2018    History of Present Illness 84 y/o F admitted 4/29 with 2 week hx of progressive SOB.  Seen 4/21 and treated for bronchitis with prednisone + antihistamines without improvement. CXR clear at that time.  Returned 4/29 with generalized weakness, decreased appetite and AMS.  Work up concerning for LLL opacity with associated pleural effusion & hypotension requiring vasopressors.  CXR has had intermittent white out on left despite bronchoscopy and attempts at thoracentesis (limited fluid returned).  Bronchoscopy demonstrated bronchomalacia   Clinical Impression   PTA, pt reports independence with cane for basic ADL and functional mobility. Do note discrepancy between report to OT and PT concerning independence as pt reports she is able to bathe herself to OT but per PT note daughter assists with this. Pt currently presents with severely limited activity tolerance for ADL participation. She additionally reports L knee arthritis pain and is concerned that her knees will buckle during mobility. She was able to stand at EOB with mod assist with RW and take lateral steps at EOB but declined transfer to chair at this time. She requires total assistance for LB ADL, mod assist for seated UB ADL, and min assist for grooming tasks. Pt demonstrating decreased R shoulder AROM. No family present to determine baseline cognition but pt noted to have decreased attention and requiring increased processing time. Pt would benefit from continued OT services while admitted to improve independence and safety with ADL and functional mobility. At current functional level, feel pt will need short-term SNF placement for continued rehabilitation. However, note family preference to take pt home. If she is to D/C home, she will need 24 hour hands on assistance as well as home health therapy follow-up.     Follow Up  Recommendations  SNF;Supervision/Assistance - 24 hour    Equipment Recommendations  3 in 1 bedside commode    Recommendations for Other Services       Precautions / Restrictions Precautions Precautions: Fall Restrictions Weight Bearing Restrictions: No      Mobility Bed Mobility Overal bed mobility: Needs Assistance Bed Mobility: Supine to Sit     Supine to sit: Mod assist     General bed mobility comments: Mod assist to bring BLE to EOB and to pivot hips. Assist also to power trunk from [facility].   Transfers Overall transfer level: Needs assistance Equipment used: Rolling walker (2 wheeled) Transfers: Sit to/from Stand Sit to Stand: Mod assist         General transfer comment: Pt able to stand at EOB with mod assist to power up and maintain standing with RW. She was able to complete 3x for pericare and take lateral steps at EOB but declined transfer to chair due to concern over knees buckling as they did yesterday per her report.     Balance Overall balance assessment: Needs assistance Sitting-balance support: No upper extremity supported;Feet supported Sitting balance-Leahy Scale: Fair     Standing balance support: Bilateral upper extremity supported;During functional activity Standing balance-Leahy Scale: Poor                             ADL either performed or assessed with clinical judgement   ADL Overall ADL's : Needs assistance/impaired Eating/Feeding: Minimal assistance;Bed level   Grooming: Min guard;Bed level   Upper Body Bathing: Moderate assistance;Sitting   Lower Body Bathing: Total assistance;Sit to/from stand  Upper Body Dressing : Moderate assistance;Sitting   Lower Body Dressing: Total assistance;Sit to/from Health and safety inspector Details (indicate cue type and reason): Pt stood at EOB with mod assist and took lateral steps at EOB but declined attempt to pivot to chair due to concern over potential knee buckling. Although OT  noted no buckling during session.  Toileting- Clothing Manipulation and Hygiene: Total assistance;Sit to/from stand         General ADL Comments: Pt limited in her ability to participate in ADL secondary to L knee pain and significantly diminished activity tolerance for ADL. She was able to stand 3x at EOB for pericare but declined transfer to chair reporting fearful of knee buckling.      Vision Patient Visual Report: No change from baseline Vision Assessment?: No apparent visual deficits Additional Comments: Will continue assessment. No deficits during basic ADL tasks performed today.      Perception     Praxis      Pertinent Vitals/Pain Pain Assessment: No/denies pain     Hand Dominance     Extremity/Trunk Assessment Upper Extremity Assessment Upper Extremity Assessment: Generalized weakness;RUE deficits/detail RUE Deficits / Details: Decreased active shoulder ROM   Lower Extremity Assessment Lower Extremity Assessment: Generalized weakness       Communication Communication Communication: No difficulties   Cognition Arousal/Alertness: Awake/alert Behavior During Therapy: WFL for tasks assessed/performed Overall Cognitive Status: No family/caregiver present to determine baseline cognitive functioning Area of Impairment: Attention;Following commands;Safety/judgement;Problem solving                   Current Attention Level: Selective   Following Commands: Follows one step commands with increased time Safety/Judgement: Decreased awareness of safety   Problem Solving: Slow processing General Comments: Pt requiring increased time to follow commands throughout session today.   General Comments  Pt with shortness of breath throughout session and wheezing at end of session. RT arrived for breathing treatment. SpO2 90-91% prior to session when supine in bed on 2L O2 via [place]. When seated at EOB and standing SpO2 93-97% on 2L O2 via [place].     Exercises     Shoulder  Instructions      Home Living Family/patient expects to be discharged to:: Private residence Living Arrangements: Children Available Help at Discharge: Family;Available PRN/intermittently(working on finding family members for 24 hour) Type of Home: House Home Access: Stairs to enter [employer] of Steps: 2 Entrance Stairs-Rails: Right;Left Home Layout: One level     Bathroom Shower/Tub: Tub only   Biochemist, clinical: Standard Bathroom Accessibility: Yes   Home Equipment: Cane - single point   Additional Comments: Need to clarify if pt has tub only or tub shower      Prior Functioning/Environment Level of Independence: Needs assistance  Gait / Transfers Assistance Needed: independent with [facility] community and household.  ADL's / Homemaking Assistance Needed: Pt reported to OT that she was independent with all ADL but to PT reporting that daughter assists with bathing.    Comments: Daughter works during the day. Another daughter is close by but cannot provide 24 hour assist.         OT Problem List: Decreased strength;Decreased activity tolerance;Impaired balance (sitting and/or standing);Decreased safety awareness;Decreased knowledge of use of DME or AE;Decreased knowledge of precautions;Decreased range of motion;Cardiopulmonary status limiting activity;Pain;Impaired UE functional use      OT Treatment/Interventions: Self-care/ADL training;Therapeutic exercise;Energy conservation;DME and/or AE instruction;Therapeutic activities;Patient/family education;Balance training    OT Goals(Current goals can  be found in the care plan section) Acute Rehab OT Goals Patient Stated Goal: to take it slow and recover OT Goal Formulation: With patient Time For Goal Achievement: 04/22/18 Potential to Achieve Goals: Good ADL Goals Pt Will Perform Grooming: with set-up;sitting Pt Will Perform Upper Body Bathing: with min guard assist;sitting Pt Will Perform Lower Body Bathing: with min  assist;sit to/from stand Pt Will Perform Lower Body Dressing: with min assist;sit to/from stand Pt Will Transfer to Toilet: with min assist;ambulating;bedside commode(BSC over toilet) Pt Will Perform Toileting - Clothing Manipulation and hygiene: with min assist;sit to/from stand  OT Frequency: Min 2X/week   Barriers to D/C:            Co-evaluation              AM-PAC PT "6 Clicks" Daily Activity     Outcome Measure Help from another person eating meals?: A Little Help from another person taking care of personal grooming?: A Little Help from another person toileting, which includes using toliet, bedpan, or urinal?: A Lot Help from another person bathing (including washing, rinsing, drying)?: A Lot Help from another person to put on and taking off regular upper body clothing?: A Lot Help from another person to put on and taking off regular lower body clothing?: Total 6 Click Score: 13   End of Session Equipment Utilized During Treatment: Rolling walker;Gait belt Nurse Communication: Mobility status  Activity Tolerance: Patient tolerated treatment well Patient left: in bed;with call bell/phone within reach  OT Visit Diagnosis: Other abnormalities of gait and mobility (R26.89);Pain;Muscle weakness (generalized) (M62.81) Pain - Right/Left: Left Pain - part of body: Knee                Time: 8891-6945 OT Time Calculation (min): 44 min Charges:  OT General Charges $OT Visit: 1 Visit OT Evaluation $OT Eval Moderate Complexity: 1 Mod OT Treatments $Self Care/Home Management : 23-37 mins G-Codes:     Norman Herrlich, MS OTR/L  Pager: Storey A Rossi Silvestro 04/08/2018, 11:06 AM

## 2018-04-09 ENCOUNTER — Inpatient Hospital Stay (HOSPITAL_COMMUNITY): Payer: Medicare Other

## 2018-04-09 LAB — CBC
HEMATOCRIT: 25.3 % — AB (ref 36.0–46.0)
Hemoglobin: 8.1 g/dL — ABNORMAL LOW (ref 12.0–15.0)
MCH: 28.9 pg (ref 26.0–34.0)
MCHC: 32 g/dL (ref 30.0–36.0)
MCV: 90.4 fL (ref 78.0–100.0)
Platelets: 434 10*3/uL — ABNORMAL HIGH (ref 150–400)
RBC: 2.8 MIL/uL — ABNORMAL LOW (ref 3.87–5.11)
RDW: 14.7 % (ref 11.5–15.5)
WBC: 12.7 10*3/uL — AB (ref 4.0–10.5)

## 2018-04-09 LAB — BASIC METABOLIC PANEL
ANION GAP: 6 (ref 5–15)
BUN: 6 mg/dL (ref 6–20)
CALCIUM: 8.2 mg/dL — AB (ref 8.9–10.3)
CO2: 26 mmol/L (ref 22–32)
Chloride: 107 mmol/L (ref 101–111)
Creatinine, Ser: 0.78 mg/dL (ref 0.44–1.00)
GFR calc Af Amer: >60 mL/min (ref >60)
Glucose, Bld: 112 mg/dL — ABNORMAL HIGH (ref 65–99)
POTASSIUM: 3.6 mmol/L (ref 3.5–5.1)
SODIUM: 139 mmol/L (ref 135–145)

## 2018-04-09 MED ORDER — LEVALBUTEROL HCL 1.25 MG/0.5ML IN NEBU
1.2500 mg | INHALATION_SOLUTION | Freq: Four times a day (QID) | RESPIRATORY_TRACT | Status: DC
  Administered 2018-04-09 (×2): 1.25 mg via RESPIRATORY_TRACT
  Filled 2018-04-09 (×2): qty 0.5

## 2018-04-09 MED ORDER — SODIUM CHLORIDE 0.9 % IV SOLN
2.0000 g | INTRAVENOUS | Status: DC
  Administered 2018-04-09 – 2018-04-10 (×2): 2 g via INTRAVENOUS
  Filled 2018-04-09 (×2): qty 20

## 2018-04-09 MED ORDER — SODIUM CHLORIDE 0.9 % IV SOLN
INTRAVENOUS | Status: DC | PRN
Start: 2018-04-09 — End: 2018-04-25
  Administered 2018-04-11 – 2018-04-20 (×2): via INTRAVENOUS

## 2018-04-09 MED ORDER — LEVALBUTEROL HCL 1.25 MG/0.5ML IN NEBU: 1.2500 mg | INHALATION_SOLUTION | Freq: Four times a day (QID) | RESPIRATORY_TRACT | Status: DC

## 2018-04-09 MED ORDER — METRONIDAZOLE IN NACL 5-0.79 MG/ML-% IV SOLN
500.0000 mg | Freq: Three times a day (TID) | INTRAVENOUS | Status: DC
  Administered 2018-04-09 – 2018-04-13 (×12): 500 mg via INTRAVENOUS
  Filled 2018-04-09 (×13): qty 100

## 2018-04-09 MED ORDER — ALBUTEROL SULFATE (2.5 MG/3ML) 0.083% IN NEBU
2.5000 mg | INHALATION_SOLUTION | RESPIRATORY_TRACT | Status: DC | PRN
  Administered 2018-04-09 – 2018-04-24 (×28): 2.5 mg via RESPIRATORY_TRACT
  Filled 2018-04-09 (×30): qty 3

## 2018-04-09 NOTE — Progress Notes (Signed)
Subjective:  Appreciate CCM help. Patient states she is improving slowly denies any chest pain or shortness of breath  Objective:  Vital Signs in the last 24 hours: Temp:  [98.8 F (37.1 C)-99.5 F (37.5 C)] 99.3 F (37.4 C) (05/07 0558) Pulse Rate:  [77-88] 84 (05/07 0558) Resp:  [16-20] 16 (05/07 0558) BP: (142-159)/(53-73) 142/67 (05/07 0558) SpO2:  [98 %-100 %] 100 % (05/07 0558) Weight:  [93.3 kg (205 lb 11 oz)] 93.3 kg (205 lb 11 oz) (05/07 1020)  Intake/Output from previous day: 05/06 0701 - 05/07 0700 In: 480 [P.O.:480] Out: 980 [Urine:800; Chest Tube:180] Intake/Output from this shift: Total I/O In: -  Out: 300 [Urine:300]  Physical Exam: Neck: no adenopathy, no carotid bruit, no JVD and supple, symmetrical, trachea midline Lungs: Decreased breath sound at bases left more than the right with occasional rhonchi Heart: regular rate and rhythm, S1, S2 normal and Soft systolic murmur noted Abdomen: soft, non-tender; bowel sounds normal; no masses,  no organomegaly Extremities: extremities normal, atraumatic, no cyanosis or edema  Lab Results: Recent Labs    04/07/18 0407 04/09/18 0424  WBC 12.5* 12.7*  HGB 7.9* 8.1*  PLT 439* 434*   Recent Labs    04/07/18 0407 04/08/18 0435 04/09/18 0424  NA 143  --  139  K 3.4*  --  3.6  CL 111  --  107  CO2 22  --  26  GLUCOSE 142*  --  112*  BUN 13  --  6  CREATININE 0.90 0.82 0.78   No results for input(s): TROPONINI in the last 72 hours.  Invalid input(s): CK, MB Hepatic Function Panel No results for input(s): PROT, ALBUMIN, AST, ALT, ALKPHOS, BILITOT, BILIDIR, IBILI in the last 72 hours. No results for input(s): CHOL in the last 72 hours. No results for input(s): PROTIME in the last 72 hours.  Imaging: Imaging results have been reviewed and Dg Chest 2 View  Result Date: 04/09/2018 CLINICAL DATA:  Status post left chest tube placement EXAM: CHEST - 2 VIEW COMPARISON:  04/08/2018 FINDINGS: Cardiac shadow  remains enlarged but stable. Left chest tube is again identified. No definitive pneumothorax is seen. The left-sided pleural effusion and basilar consolidation is again identified. Small right-sided pleural effusion is seen. No acute bony abnormality is noted. IMPRESSION: Stable appearance of the chest when compared with the previous exam. Electronically Signed   By: Inez Catalina M.D.   On: 04/09/2018 09:41   Dg Chest Port 1 View  Result Date: 04/08/2018 CLINICAL DATA:  Follow-up left-sided empyema with chest tube treatment. EXAM: PORTABLE CHEST 1 VIEW COMPARISON:  Chest x-ray of Apr 07, 2018 FINDINGS: A small caliber chest tube has been placed on the left. The tip projects just above the aortic arch. There is a less than 5% left apical pneumothorax. There is persistent increased density at the left lung base with obscuration of the left hemidiaphragm. There is increased density in the right infrahilar region compatible with pneumonia and small effusion. There is calcification in the wall of the aortic arch. The trachea is midline. The left heart border is partially obscured. The pulmonary vascularity is not clearly engorged. IMPRESSION: Persistent left basilar atelectasis or pneumonia with left pleural effusion-empyema. Newly placed left sided chest tube. Less than 5% left apical pneumothorax appears new. Increasing density at the right lung base worrisome for atelectasis or pneumonia. Thoracic aortic atherosclerosis. Electronically Signed   By: David  Martinique M.D.   On: 04/08/2018 09:38   Ct Image  Guided Drainage By Percutaneous Catheter  Result Date: 04/07/2018 CLINICAL DATA:  Left lung pneumonia with associated empyema. EXAM: CT GUIDED CATHETER DRAINAGE OF LEFT PLEURAL EMPYEMA ANESTHESIA/SEDATION: 1.0 mg IV Versed 25 mcg IV Fentanyl Total Moderate Sedation Time:  22 minutes The patient's level of consciousness and physiologic status were continuously monitored during the procedure by Radiology nursing.  PROCEDURE: The procedure, risks, benefits, and alternatives were explained to the patient's daughter. Questions regarding the procedure were encouraged and answered. The patient's daughter understands and consents to the procedure. A time out was performed prior to initiating the procedure. CT was performed in prone followed by decubitus positions with the left side up. The posterior left chest wall was prepped with chlorhexidine in a sterile fashion, and a sterile drape was applied covering the operative field. A sterile gown and sterile gloves were used for the procedure. Local anesthesia was provided with 1% Lidocaine. An 18 gauge trocar needle was advanced into the posterior left pleural space under CT guidance. Fluid was aspirated. A guidewire was advanced through the needle. The needle was removed. The tract was dilated and a 12 French pigtail drainage catheter advanced into the pleural space. Catheter positioning was confirmed by CT. The catheter was connected to a [employer] device. The catheter exit site was secured with a Prolene retention suture and Vaseline gauze dressing. COMPLICATIONS: None FINDINGS: Initial prone imaging showed movement of pleural fluid anteriorly in the left pleural space. From a decubitus position, the pleural fluid was able to be accessed to allow pleural drainage catheter placement. There was return of amber colored fluid. IMPRESSION: CT-guided placement of left-sided 12 French pleural drain from a posterior approach into a pleural empyema. The drain was connected to a Pleur-evac device which will be connected to wall suction at -20 cm of water. Electronically Signed   By: Aletta Edouard M.D.   On: 04/07/2018 13:43    Cardiac Studies:  Assessment/Plan:  Resolving Left lung pneumonia complicated by empyema  Status post bronchial noted to have bronchomalacia. Status post CT-guided drain left pleural space Status post septic shock. 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

## 2018-04-09 NOTE — Progress Notes (Addendum)
[facility] P[facility]M Progress Note    Brief Summary: 84 y/o F admitted 4/29 with 2 week hx of progressive SOB.  Seen 4/21 and treated for bronchitis with prednisone + antihistamines without improvement. CXR clear at that time.  Returned 4/29 with generalized weakness, decreased appetite and AMS.  Work up concerning for LLL opacity with associated pleural effusion & hypotension requiring vasopressors.  CXR has had intermittent white out on left despite bronchoscopy and attempts at thoracentesis (limited fluid returned).  Bronchoscopy demonstrated bronchomalacia.     S:  tPA / pulmozyme instilled 5/6 as first treatment.  Chest tube now with 750cc output. Just repeat 2nd tPA at 10am today, plan for pulmozyme at 11am then 1hr dwell and place back to suction. Pleural cultures growing GNR anaerobe (Parvimonas micra).  Adding flagyl to rocephin. O: Blood pressure (!) 145/66, pulse 85, temperature 99.4 F (37.4 C), temperature source Oral, resp. rate 16, height 5' 2.5" (1.588 m), weight 195 lb 12.3 oz (88.8 kg), SpO2 100 %.  General:  Elderly female, resting in bed, in NAD, family at bedside HEENT: MM pink/moist Neuro: AAOx4, speech clear, MAE  CV: s1s2 rrr, no m/r/g PULM: even/non-labored, clear on R, diminished on L RW:ERXV, non-tender, bsx4 active  Extremities: warm/dry, generalized 2+ edema  Skin: no rashes or lesions   CBC Latest Ref Rng & Units 04/09/2018 04/07/2018 04/04/2018  WBC 4.0 - 10.5 K/uL 12.7(H) 12.5(H) 23.5(H)  Hemoglobin 12.0 - 15.0 g/dL 8.1(L) 7.9(L) 9.6(L)  Hematocrit 36.0 - 46.0 % 25.3(L) 24.8(L) 29.1(L)  Platelets 150 - 400 K/uL 434(H) 439(H) 327    BMP Latest Ref Rng & Units 04/09/2018 04/08/2018 04/07/2018  Glucose 65 - 99 mg/dL 112(H) - 142(H)  BUN 6 - 20 mg/dL 6 - 13  Creatinine 0.44 - 1.00 mg/dL 0.78 0.82 0.90  Sodium 135 - 145 mmol/L 139 - 143  Potassium 3.5 - 5.1 mmol/L 3.6 - 3.4(L)  Chloride 101 - 111 mmol/L 107 - 111  CO2 22 - 32 mmol/L 26 - 22  Calcium 8.9 - 10.3 mg/dL  8.2(L) - 8.7(L)    CXR 5/3 - slight improvement in LLL effusion   Antibiotics Rocephin 4/29 >>  Azithro 4/29 >> 4/30 Flagyl 5/7 >   Cultures Pleural fluid 5/3 >> rare WBC, moderate GNR > Parvimonas Micra BCx2 4/29 >> negative  BCx2 4/29 >>   A: LLL PNA  Concern for LLL Empyema - GNR on gram stain of pleural fluid  Bronchomalacia  Left Pleural Effusion  Septic Shock - resolved  P: Continue intrapleural tPA / pulmozyme with 1hr dwell time before resuming suction on chest tube - will plan for daily dosing x 3 days vs q12 Mobilize - OOB / PT efforts D9/x abx; however, adding flagyl for further anaerobic coverage  Follow cultures through completion Continue mucinex BID  Follow intermittent CXR    Global - rest per primary MD.     Montey Hora, PA - [facility] Pager: [phone]  or [phone] 04/09/2018, 10:10 AM

## 2018-04-09 NOTE — Progress Notes (Signed)
PCCM Interval Progress Note  Pulmozyme instilled at 11am.  Plan 1hr dwell time and RN asked to then place chest tube back to suction.   Montey Hora, [facility] Pager: [phone]  or [phone] 04/09/2018, 11:18 AM

## 2018-04-09 NOTE — Progress Notes (Signed)
Referring Physician(s): CCM/Yacoub  Supervising Physician: Daryll Brod  Patient Status:  [facility] - In-pt  Chief Complaint:  Left empyema  Subjective:  Chest tube drain placed 5/5 CCM - tpa dwell yesterday; and pulmozyme dwell Now 750 cc in pleurcvac (450)   Allergies: Codeine and Penicillins  Medications: Prior to Admission medications   Medication Sig Start Date End Date Taking? Authorizing Provider  albuterol (PROVENTIL HFA;VENTOLIN HFA) 108 (90 BASE) MCG/ACT inhaler Inhale 2 puffs into the lungs every 6 (six) hours as needed for wheezing or shortness of breath.    Yes [provider]  albuterol (PROVENTIL) (2.5 MG/3ML) 0.083% nebulizer solution Take 2.5 mg by nebulization every 6 (six) hours as needed for wheezing.   Yes [provider]  amLODipine (NORVASC) 5 MG tablet Take 5 mg by mouth daily.   Yes [provider]  aspirin EC 81 MG tablet Take 81 mg by mouth daily.   Yes [provider]  atorvastatin (LIPITOR) 20 MG tablet Take 20 mg by mouth daily.   Yes [provider]  carvedilol (COREG) 25 MG tablet Take 25 mg by mouth 2 (two) times daily with a meal.   Yes [provider]  Cholecalciferol (VITAMIN D-3 PO) Take 1 capsule by mouth daily.   Yes [provider]  Cyanocobalamin (VITAMIN B-12 PO) Take 500 mcg by mouth daily.    Yes [provider]  doxazosin (CARDURA) 4 MG tablet Take 4 mg by mouth at bedtime.    Yes [provider]  fluticasone (FLONASE) 50 MCG/ACT nasal spray Place 2 sprays into the nose daily as needed for allergies or rhinitis.    Yes [provider]  FOLIC ACID PO Take 1 tablet by mouth daily.    Yes [provider]  gabapentin (NEURONTIN) 300 MG capsule Take 300 mg by mouth 3 (three) times daily.    Yes [provider]  levothyroxine (SYNTHROID, LEVOTHROID) 50 MCG tablet Take 50 mcg by mouth daily.   Yes [provider]  Liniments  (SALONPAS PAIN RELIEF PATCH EX) Apply 1 patch topically daily as needed (pain).   Yes [provider]  losartan-hydrochlorothiazide (HYZAAR) 50-12.5 MG per tablet Take 1 tablet by mouth daily.   Yes [provider]  traMADol (ULTRAM) 50 MG tablet Take 1 tablet (50 mg total) by mouth every 6 (six) hours as needed. Patient taking differently: Take 50 mg by mouth See admin instructions. Take 50 mg by mouth once a day as needed for pain and 50 mg at bedtime scheduled 03/17/18  Yes Daleen Bo, MD  trolamine salicylate (ASPERCREME) 10 % cream Apply 1 application topically as needed (pain).   Yes [provider]  cefdinir (OMNICEF) 300 MG capsule Take 1 capsule (300 mg total) by mouth 2 (two) times daily. Patient not taking: Reported on 04/01/2018 05/17/13   Orpah Greek, MD     Vital Signs: BP (!) 142/67 (BP Location: Right Arm)   Pulse 84   Temp 99.3 F (37.4 C) (Oral)   Resp 16   Ht 5' 2.5" (1.588 m)   Wt 201 lb 8 oz (91.4 kg)   SpO2 100%   BMI 36.27 kg/m   Physical Exam  Pulmonary/Chest: She has rales.  Musculoskeletal: Normal range of motion.  Neurological: She is alert.  Skin: Skin is warm and dry.  Site is clean and dry NT no bleeding No airleak 750 cc serous color fluid/ blood tinged  Nursing note and vitals reviewed.  Imaging: Dg Chest 1 View  Result Date: 04/05/2018 CLINICAL DATA:  84 year old female status post ultrasound-guided left side thoracentesis this afternoon. EXAM: CHEST  1 VIEW COMPARISON:  Thoracentesis ultrasound images 1636 hours today. Portable chest 0450 hours today, and earlier. FINDINGS: Portable AP semi upright view at 1656 hours. Reduced left pleural effusion volume and improved left upper lung ventilation following thoracentesis. No pneumothorax identified. Continued moderate opacification of the left mid and lower lung, with evidence of some left lung volume loss resulting in leftward shift of the mediastinum as seen  on the CT 04/02/2018. Calcified aortic atherosclerosis. Visible mediastinal contours are within normal limits. Allowing for portable technique the right lung remains clear. IMPRESSION: 1. Regressed left pleural fluid and improved left upper lung ventilation following thoracentesis with no pneumothorax or adverse features. 2. Continued combination of left lower lung opacification and volume loss. 3. No new cardiopulmonary abnormality identified. Electronically Signed   By: Genevie Ann M.D.   On: 04/05/2018 17:26   Ct Chest W Contrast  Result Date: 04/07/2018 CLINICAL DATA:  Left pneumonia and empyema with limited thoracentesis demonstrating gram-negative rods in left pleural fluid. Evaluation by chest CT to determine approach for possible percutaneous pleural drainage catheter placement. EXAM: CT CHEST WITH CONTRAST TECHNIQUE: Multidetector CT imaging of the chest was performed during intravenous contrast administration. CONTRAST:  56 mL ISOVUE-300 IOPAMIDOL (ISOVUE-300) INJECTION 61% COMPARISON:  CT of the chest on 04/02/2018 FINDINGS: Cardiovascular: The heart size is stable and within normal limits. Stable coronary artery calcified plaque in a 3 vessel distribution. Trace pericardial fluid. Mediastinum/Nodes: No enlarged mediastinal or hilar lymph nodes. Stable small mediastinal nodes. Lungs/Pleura: Aeration of the lingula has improved since the prior study. There remains dense consolidation of the left lower lobe with associated pleural effusion. Volume of pleural fluid in the upper hemithorax shows slight enlargement and is predominantly posterior. There is a small amount of fluid in the major fissure. Small loculation is seen laterally towards the base. There is an additional fairly stable basilar/subpulmonic component. No significant pleural enhancement or suggestion of any pleural masses by CT. No pneumothorax or discrete pulmonary nodules identified. Upper Abdomen: No acute abnormality. Musculoskeletal: No  chest wall abnormality. No acute or significant osseous findings. IMPRESSION: Slight enlargement in loculated left pleural effusion along the posterior upper hemithorax compared to the prior CT. This would likely be the most appropriate area to target for percutaneous catheter drainage. This component also appears to be in communication with the fluid extending into the major fissure. The basilar/subpulmonic component and small lateral loculated component appears stable. Stable dense consolidation of the left lower lobe. Aeration of the lingula appears slightly improved since the prior CT. Electronically Signed   By: Aletta Edouard M.D.   On: 04/07/2018 08:21   Dg Chest Port 1 View  Result Date: 04/08/2018 CLINICAL DATA:  Follow-up left-sided empyema with chest tube treatment. EXAM: PORTABLE CHEST 1 VIEW COMPARISON:  Chest x-ray of Apr 07, 2018 FINDINGS: A small caliber chest tube has been placed on the left. The tip projects just above the aortic arch. There is a less than 5% left apical pneumothorax. There is persistent increased density at the left lung base with obscuration of the left hemidiaphragm. There is increased density in the right infrahilar region compatible with pneumonia and small effusion. There is calcification in the wall of the aortic arch. The trachea is midline. The left heart border is partially obscured. The pulmonary vascularity is not clearly engorged. IMPRESSION: Persistent left basilar  atelectasis or pneumonia with left pleural effusion-empyema. Newly placed left sided chest tube. Less than 5% left apical pneumothorax appears new. Increasing density at the right lung base worrisome for atelectasis or pneumonia. Thoracic aortic atherosclerosis. Electronically Signed   By: David  Martinique M.D.   On: 04/08/2018 09:38   Dg Chest Port 1 View  Result Date: 04/07/2018 CLINICAL DATA:  84 year old female with history of pneumonia. EXAM: PORTABLE CHEST 1 VIEW COMPARISON:  Chest x-ray  04/05/2018. FINDINGS: Complete opacification throughout the mid to lower left hemithorax which may reflect atelectasis and/or consolidation with superimposed large left pleural effusion. Right lung appears clear. No right pleural effusion. No evidence of pulmonary edema. No pneumothorax. Heart size is borderline enlarged. Upper mediastinal contours are within normal limits. Aortic atherosclerosis. IMPRESSION: 1. Atelectasis and/or consolidation throughout the left lung base with large left parapneumonic pleural effusion. 2. Aortic atherosclerosis. Electronically Signed   By: Vinnie Langton M.D.   On: 04/07/2018 09:09   Ct Image Guided Drainage By Percutaneous Catheter  Result Date: 04/07/2018 CLINICAL DATA:  Left lung pneumonia with associated empyema. EXAM: CT GUIDED CATHETER DRAINAGE OF LEFT PLEURAL EMPYEMA ANESTHESIA/SEDATION: 1.0 mg IV Versed 25 mcg IV Fentanyl Total Moderate Sedation Time:  22 minutes The patient's level of consciousness and physiologic status were continuously monitored during the procedure by Radiology nursing. PROCEDURE: The procedure, risks, benefits, and alternatives were explained to the patient's daughter. Questions regarding the procedure were encouraged and answered. The patient's daughter understands and consents to the procedure. A time out was performed prior to initiating the procedure. CT was performed in prone followed by decubitus positions with the left side up. The posterior left chest wall was prepped with chlorhexidine in a sterile fashion, and a sterile drape was applied covering the operative field. A sterile gown and sterile gloves were used for the procedure. Local anesthesia was provided with 1% Lidocaine. An 18 gauge trocar needle was advanced into the posterior left pleural space under CT guidance. Fluid was aspirated. A guidewire was advanced through the needle. The needle was removed. The tract was dilated and a 12 French pigtail drainage catheter advanced into  the pleural space. Catheter positioning was confirmed by CT. The catheter was connected to a [employer] device. The catheter exit site was secured with a Prolene retention suture and Vaseline gauze dressing. COMPLICATIONS: None FINDINGS: Initial prone imaging showed movement of pleural fluid anteriorly in the left pleural space. From a decubitus position, the pleural fluid was able to be accessed to allow pleural drainage catheter placement. There was return of amber colored fluid. IMPRESSION: CT-guided placement of left-sided 12 French pleural drain from a posterior approach into a pleural empyema. The drain was connected to a Pleur-evac device which will be connected to wall suction at -20 cm of water. Electronically Signed   By: Aletta Edouard M.D.   On: 04/07/2018 13:43   Ir Thoracentesis Asp Pleural Space W/img Guide  Result Date: 04/05/2018 CLINICAL DATA:  Left lung pneumonia with associated pleural effusion. EXAM: ULTRASOUND GUIDED LEFT THORACENTESIS COMPARISON:  CT of the chest on 04/02/2018 as well as multiple recent chest x-rays. PROCEDURE: An ultrasound guided thoracentesis was thoroughly discussed with the patient and questions answered. The benefits, risks, alternatives and complications were also discussed. The patient understands and wishes to proceed with the procedure. Written consent was obtained. Ultrasound was performed to localize and mark an adequate pocket of fluid in the left chest. The area was then prepped and draped in the normal  sterile fashion. 1% Lidocaine was used for local anesthesia. Under ultrasound guidance a 6 French Safe-T-Centesis catheter was introduced. Thoracentesis was performed. The catheter was removed and a dressing applied. COMPLICATIONS: None FINDINGS: Ultrasound shows a complex left pleural effusion with posterior pocket visualized by ultrasound. After advancement of a catheter into this pocket, there was initial return of fairly thin, yellowish fluid. This  turned into more overtly purulent fluid towards the end of aspiration with ability to remove only 90 mL of fluid from the single pleural access. Fluid samples were sent for requested laboratory tests. IMPRESSION: Complex left pleural effusion. 90 mL of fluid was able to be aspirated from a posterior pocket. Nature of fluid changed from initially clear appearing fluid to more overtly purulent fluid during aspiration. Electronically Signed   By: Aletta Edouard M.D.   On: 04/05/2018 18:02    Labs:  CBC: Recent Labs    04/04/18 0336 04/04/18 1022 04/07/18 0407 04/09/18 0424  WBC 21.8* 23.5* 12.5* 12.7*  HGB 8.7* 9.6* 7.9* 8.1*  HCT 26.6* 29.1* 24.8* 25.3*  PLT 309 327 439* 434*    COAGS: Recent Labs    04/01/18 1928  INR 1.36  APTT 39*    BMP: Recent Labs    04/04/18 1022 04/06/18 1141 04/07/18 0407 04/08/18 0435 04/09/18 0424  NA 140 140 143  --  139  K 4.3 3.6 3.4*  --  3.6  CL 110 111 111  --  107  CO2 19* 17* 22  --  26  GLUCOSE 97 135* 142*  --  112*  BUN 21* 13 13  --  6  CALCIUM 8.8* 9.1 8.7*  --  8.2*  CREATININE 1.22* 1.01* 0.90 0.82 0.78  GFRNONAA 38* 48* 55* >60 >60  GFRAA 44* 56* >60 >60 >60    LIVER FUNCTION TESTS: Recent Labs    04/01/18 1928  BILITOT 1.5*  AST 29  ALT 16  ALKPHOS 106  PROT 6.9  ALBUMIN 2.3*    Assessment and Plan:  Left empyema CT drain intact tpa and pulmozyme dwell yesterday - per CCM 750 cc in pleurvac (450) Will follow CXR pending  Electronically Signed: Leightyn Cina A, PA-C 04/09/2018, 7:53 AM   I spent a total of 15 Minutes at the the patient's bedside AND on the patient's hospital floor or unit, greater than 50% of which was counseling/coordinating care for left chest tube drain

## 2018-04-10 ENCOUNTER — Inpatient Hospital Stay (HOSPITAL_COMMUNITY): Payer: Medicare Other

## 2018-04-10 LAB — PATHOLOGIST SMEAR REVIEW

## 2018-04-10 LAB — BODY FLUID CULTURE

## 2018-04-10 NOTE — Procedures (Signed)
Left chest tube cleaned stop cock and Pulmozyme 25cc instill. Chest tube clamped and will instruct RN to un clamp at 1130 am.  Richardson Landry Shavonna Corella ACNP [facility] Pager [phone] till 1 pm If no answer page [phone] 04/10/2018, 10:38 AM

## 2018-04-10 NOTE — Progress Notes (Signed)
Subjective:  Complains of left-sided pleuritic chest pain.  Denies any shortness of breath.  Chest x-ray showed worsening effusion.  Objective:  Vital Signs in the last 24 hours: Temp:  [97.9 F (36.6 C)-98.9 F (37.2 C)] 98.6 F (37 C) (05/08 1314) Pulse Rate:  [82-94] 82 (05/08 1314) Resp:  [12-29] 12 (05/08 1232) BP: (99-130)/(46-61) 99/53 (05/08 1314) SpO2:  [97 %-100 %] 98 % (05/08 1438)  Intake/Output from previous day: 05/07 0701 - 05/08 0700 In: 726.3 [I.V.:626.3; IV Piggyback:100] Out: 920 [Urine:610; Chest Tube:310] Intake/Output from this shift: No intake/output data recorded.  Physical Exam: Neck: no adenopathy, no carotid bruit, no JVD and supple, symmetrical, trachea midline Lungs: decreased breath sounds in bases, left more than right with rhonchi noted Heart: regular rate and rhythm, S1, S2 normal and soft systolic murmur noted Abdomen: soft, non-tender; bowel sounds normal; no masses,  no organomegaly Extremities: extremities normal, atraumatic, no cyanosis or edema  Lab Results: Recent Labs    04/09/18 0424  WBC 12.7*  HGB 8.1*  PLT 434*   Recent Labs    04/08/18 0435 04/09/18 0424  NA  --  139  K  --  3.6  CL  --  107  CO2  --  26  GLUCOSE  --  112*  BUN  --  6  CREATININE 0.82 0.78   No results for input(s): TROPONINI in the last 72 hours.  Invalid input(s): CK, MB Hepatic Function Panel No results for input(s): PROT, ALBUMIN, AST, ALT, ALKPHOS, BILITOT, BILIDIR, IBILI in the last 72 hours. No results for input(s): CHOL in the last 72 hours. No results for input(s): PROTIME in the last 72 hours.  Imaging: Imaging results have been reviewed and Dg Chest 2 View  Result Date: 04/09/2018 CLINICAL DATA:  Status post left chest tube placement EXAM: CHEST - 2 VIEW COMPARISON:  04/08/2018 FINDINGS: Cardiac shadow remains enlarged but stable. Left chest tube is again identified. No definitive pneumothorax is seen. The left-sided pleural effusion  and basilar consolidation is again identified. Small right-sided pleural effusion is seen. No acute bony abnormality is noted. IMPRESSION: Stable appearance of the chest when compared with the previous exam. Electronically Signed   By: Inez Catalina M.D.   On: 04/09/2018 09:41   Dg Chest Port 1 View  Result Date: 04/10/2018 CLINICAL DATA:  Followup left empyema and chest tube. EXAM: PORTABLE CHEST 1 VIEW COMPARISON:  04/09/2018 and older exams. FINDINGS: Opacity of the left appears mildly increased consistent with an increase in pleural fluid. A past at the right lung base is similar to previous day's study consistent with a small pleural effusion. Lung base parenchymal opacity is noted consistent with atelectasis of the right and either atelectasis pneumonia or comminution on the left. Left upper a door X pigtail chest tube is stable.  No pneumothorax. IMPRESSION: 1. Left pleural effusion has mildly increased from the previous exam. No other change. 2. Persistent left lung base consolidation. 3. No change in the left chest tube and no pneumothorax visualized. Electronically Signed   By: Lajean Manes M.D.   On: 04/10/2018 09:51    Cardiac Studies:  Assessment/Plan:  Resolving Left lung pneumoniacomplicated by empyemaStatus post bronchial noted to have bronchomalacia. Status post CT-guided drain left pleural space Status post septic shock. 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 We'll get CVTS consult, in view of worsening effusion   LOS: 9 days    [name] 04/10/2018, 5:41 PM

## 2018-04-10 NOTE — Progress Notes (Signed)
Chest tube unclamped at 1130hrs as instructed by critical care NP

## 2018-04-10 NOTE — Progress Notes (Signed)
Physical Therapy Treatment Patient Details Name: Monique Lin MRN: [id number] DOB: 03-Jun-1927 Today's Date: 04/10/2018    History of Present Illness 84 y/o F admitted 4/29 with 2 week hx of progressive SOB.  Seen 4/21 and treated for bronchitis with prednisone + antihistamines without improvement. CXR clear at that time.  Returned 4/29 with generalized weakness, decreased appetite and AMS.  Work up concerning for LLL opacity with associated pleural effusion & hypotension requiring vasopressors.  CXR has had intermittent white out on left despite bronchoscopy and attempts at thoracentesis (limited fluid returned).  Bronchoscopy demonstrated bronchomalacia    PT Comments    Pt remains to require cues for encouragement to participate in session this pm.  Pt remains +2 assistance for all functional mobility.  Pt is a high risk for repeated falls if she return home and therefore would continue to benefit from skilled placement at SNF to improve strength and function.     Follow Up Recommendations  SNF;Supervision/Assistance - 24 hour     Equipment Recommendations  Rolling walker with 5" wheels;3in1 (PT)    Recommendations for Other Services       Precautions / Restrictions Precautions Precautions: Fall Restrictions Weight Bearing Restrictions: No    Mobility  Bed Mobility Overal bed mobility: Needs Assistance Bed Mobility: Sit to Supine       Sit to supine: Max assist;+2 for physical assistance   General bed mobility comments: Pt required assistance to lower trunk and Lift B LEs back into bed.  Pt grimmacing with pain at chest tube site.    Transfers Overall transfer level: Needs assistance Equipment used: Rolling walker (2 wheeled) Transfers: Sit to/from Stand Sit to Stand: Mod assist;+2 physical assistance         General transfer comment: Cues for hand placement to and from seated surface.  Pt slow to ascend and intially reaches for RW for support.    Ambulation/Gait Ambulation/Gait assistance: Mod assist;+2 physical assistance Ambulation Distance (Feet): 12 Feet Assistive device: Rolling walker (2 wheeled) Gait Pattern/deviations: Step-to pattern;Trunk flexed     General Gait Details: Cues for RW safety with assistance to turn and maintain position of RW.  Pt slow and guarded but able to advance gait distance.     Stairs             Wheelchair Mobility    Modified Rankin (Stroke Patients Only)       Balance Overall balance assessment: Needs assistance Sitting-balance support: No upper extremity supported;Feet supported Sitting balance-Leahy Scale: Fair     Standing balance support: Bilateral upper extremity supported;During functional activity Standing balance-Leahy Scale: Poor                              Cognition Arousal/Alertness: Awake/alert Behavior During Therapy: WFL for tasks assessed/performed Overall Cognitive Status: Difficult to assess Area of Impairment: Attention;Following commands;Safety/judgement;Problem solving                   Current Attention Level: Selective   Following Commands: Follows one step commands with increased time Safety/Judgement: Decreased awareness of safety   Problem Solving: Slow processing General Comments: Pt requiring increased time to follow commands throughout session today.      Exercises      General Comments        Pertinent Vitals/Pain Pain Assessment: Faces Faces Pain Scale: Hurts even more Pain Location: L side at chest tube site Pain Descriptors / Indicators: Discomfort;Grimacing;Guarding  Pain Intervention(s): Monitored during session;Repositioned    Home Living                      Prior Function            PT Goals (current goals can now be found in the care plan section) Acute Rehab PT Goals Patient Stated Goal: to take it slow and recover Potential to Achieve Goals: Fair Progress towards PT goals:  Progressing toward goals    Frequency    Min 3X/week(Family is hoping to progress to HHPT.  )      PT Plan Current plan remains appropriate    Co-evaluation              AM-PAC PT "6 Clicks" Daily Activity  Outcome Measure  Difficulty turning over in bed (including adjusting bedclothes, sheets and blankets)?: Unable Difficulty moving from lying on back to sitting on the side of the bed? : Unable Difficulty sitting down on and standing up from a chair with arms (e.g., wheelchair, bedside commode, etc,.)?: Unable Help needed moving to and from a bed to chair (including a wheelchair)?: A Lot Help needed walking in hospital room?: A Lot Help needed climbing 3-5 steps with a railing? : Total 6 Click Score: 8    End of Session Equipment Utilized During Treatment: Gait belt;Oxygen Activity Tolerance: Patient limited by fatigue Patient left: in chair;with call bell/phone within reach;with nursing/sitter in room;with family/visitor present Nurse Communication: Mobility status PT Visit Diagnosis: Unsteadiness on feet (R26.81);History of falling (Z91.81);Muscle weakness (generalized) (M62.81)     Time: 3532-9924 PT Time Calculation (min) (ACUTE ONLY): 26 min  Charges:  $Gait Training: 8-22 mins $Therapeutic Activity: 8-22 mins                    G Codes:       Governor Rooks, PTA pager [phone]    Cristela Blue 04/10/2018, 5:22 PM

## 2018-04-10 NOTE — Procedures (Signed)
Second component instilled in chest tube 1220.  Richardson Landry Minor ACNP Maryanna Shape PCCM Pager [phone] till 1 pm If no answer page [phone] 04/10/2018, 12:29 PM

## 2018-04-10 NOTE — Progress Notes (Signed)
[place] PCCM Progress Note    Brief Summary: 84 y/o F admitted 4/29 with 2 week hx of progressive SOB.  Seen 4/21 and treated for bronchitis with prednisone + antihistamines without improvement. CXR clear at that time.  Returned 4/29 with generalized weakness, decreased appetite and AMS.  Work up concerning for LLL opacity with associated pleural effusion & hypotension requiring vasopressors.  CXR has had intermittent white out on left despite bronchoscopy and attempts at thoracentesis (limited fluid returned).  Bronchoscopy demonstrated bronchomalacia.     S:   No c/o.   tPA / pulmozyme instilled earlier today.   370ml total outpt 5/7  O: Vitals:   04/09/18 2116 04/09/18 2316 04/10/18 0618 04/10/18 1120  BP: (!) 113/54  (!) 130/51   Pulse: 94  94   Resp: 17  16 (!) 27  Temp: 98.9 F (37.2 C)  98.4 F (36.9 C)   TempSrc: Oral  Oral   SpO2: 97% 98% 97% 97%  Weight:      Height:        General:  Pleasant, elderly female, NAD in bed  HEENT: MM pink/moist Neuro: AAOx4, speech clear, MAE, gen weakness  CV: s1s2 rrr, no m/r/g PULM: resps even non labored on RA, diminished L, L chest tube in place  QP:RFFM, non-tender, bsx4 active  Extremities: warm/dry, generalized 1-2+ edema  Skin: no rashes or lesions   CBC Latest Ref Rng & Units 04/09/2018 04/07/2018 04/04/2018  WBC 4.0 - 10.5 K/uL 12.7(H) 12.5(H) 23.5(H)  Hemoglobin 12.0 - 15.0 g/dL 8.1(L) 7.9(L) 9.6(L)  Hematocrit 36.0 - 46.0 % 25.3(L) 24.8(L) 29.1(L)  Platelets 150 - 400 K/uL 434(H) 439(H) 327    BMP Latest Ref Rng & Units 04/09/2018 04/08/2018 04/07/2018  Glucose 65 - 99 mg/dL 112(H) - 142(H)  BUN 6 - 20 mg/dL 6 - 13  Creatinine 0.44 - 1.00 mg/dL 0.78 0.82 0.90  Sodium 135 - 145 mmol/L 139 - 143  Potassium 3.5 - 5.1 mmol/L 3.6 - 3.4(L)  Chloride 101 - 111 mmol/L 107 - 111  CO2 22 - 32 mmol/L 26 - 22  Calcium 8.9 - 10.3 mg/dL 8.2(L) - 8.7(L)    CXR 5/8>>> 1. Left pleural effusion has mildly increased from the previous exam.  No other change. 2. Persistent left lung base consolidation. 3. No change in the left chest tube and no pneumothorax visualized.   Antibiotics Rocephin 4/29 >>  Azithro 4/29 >> 4/30 Flagyl 5/7 >   Cultures L Pleural fluid 5/3 >> rare WBC, moderate GNR > Parvimonas Micra, prevotella, B lactam POS BCx2 4/29 >> negative  BCx2 4/29 >>   Assessment/Plan  LLL PNA  L empyema with parvimonas micra in culture, B lactam POS Bronchomalacia  Left Pleural Effusion  Septic Shock - resolved  P: Continue chest tube  Last day of planned intrapleural tPA / pulmozyme  Mobilize - OOB / PT efforts D10/x abx; however, flagyl added 5/7 for further anaerobic coverage  Follow cultures for sens Continue mucinex BID  Follow intermittent CXR    Global - rest per primary MD.     Nickolas Madrid, NP 04/10/2018  12:28 PM Pager: [phone] or [phone]

## 2018-04-10 NOTE — NC FL2 (Addendum)
[place] LEVEL OF CARE SCREENING TOOL     IDENTIFICATION  Patient Name: Monique Lin Birthdate: 1927/05/11 Sex: female Admission Date (Current Location): 04/01/2018  [facility] and [place] Number:  Herbalist and Address:  [facility], [place] [address], [place], [place] [address]      Provider Number: [id number]  Attending Physician Name and Address:  [name], MD  Relative Name and Phone Number:       Current Level of Care: Hospital Recommended Level of Care: [place] Prior Approval Number:    Date Approved/Denied:   PASRR Number: [id number]  Discharge Plan: SNF    Current Diagnoses: Patient Active Problem List   Diagnosis Date Noted  . CAP (community acquired pneumonia)   . Empyema ([place])   . Pleural effusion, left   . Left lower lobe pneumonia ([place]) 04/01/2018  . Bronchitis     Orientation RESPIRATION BLADDER Height & Weight     Self, Time, Situation, Place  Normal, O2(intermittant nasal canula 2L) Continent, External catheter Weight: 205 lb 11 oz (93.3 kg) Height:  5' 2.5" (158.8 cm)  BEHAVIORAL SYMPTOMS/MOOD NEUROLOGICAL BOWEL NUTRITION STATUS      Continent Diet(see discharge summary)  AMBULATORY STATUS COMMUNICATION OF NEEDS Skin   Extensive Assist Verbally Skin abrasions, Other (Comment)(MASD on breast and perineum; skin tear on sacrum with foam)                       Personal Care Assistance Level of Assistance  Bathing, Feeding, Dressing Bathing Assistance: Maximum assistance Feeding assistance: Independent Dressing Assistance: Maximum assistance     Functional Limitations Info  Speech, Hearing, Sight Sight Info: Adequate Hearing Info: Adequate Speech Info: Adequate    SPECIAL CARE FACTORS FREQUENCY  PT (By licensed PT), OT (By licensed OT)     PT Frequency: 5x week OT Frequency: 5x week            Contractures Contractures Info: Not present    Additional  Factors Info  Code Status, Allergies Code Status Info: Full Code Allergies Info: Codeine; Penicillins           Current Medications (04/10/2018):  This is the current hospital active medication list Current Facility-Administered Medications  Medication Dose Route Frequency Provider Last Rate Last Dose  . 0.9 %  sodium chloride infusion   Intravenous PRN Chesley Mires, MD      . acetaminophen (TYLENOL) tablet 650 mg  650 mg Oral Q6H PRN Noe Gens L, NP   650 mg at 04/10/18 0651  . albuterol (PROVENTIL) (2.5 MG/3ML) 0.083% nebulizer solution 2.5 mg  2.5 mg Nebulization Q4H PRN Chesley Mires, MD   2.5 mg at 04/10/18 1438  . alteplase (CATHFLO ACTIVASE) 10 mg in sodium chloride 0.9 % 20 mL  10 mg Intrapleural Daily Desai, Rahul P, PA-C   10 mg at 04/09/18 1015   And  . dornase alpha (PULMOZYME) 5 mg in sterile water (preservative free) 25 mL  5 mg Intrapleural Daily Desai, Rahul P, PA-C   5 mg at 04/09/18 1115  . aspirin chewable tablet 81 mg  81 mg Oral Daily [name], MD   81 mg at 04/10/18 1041  . cefTRIAXone (ROCEPHIN) 2 g in sodium chloride 0.9 % 100 mL IVPB  2 g Intravenous Q24H Desai, Rahul P, PA-C   Stopped at 04/09/18 2118  . docusate sodium (COLACE) capsule 100 mg  100 mg Oral Daily Marolyn Hammock  J, MD   100 mg at 04/10/18 1041  . enoxaparin (LOVENOX) injection 40 mg  40 mg Subcutaneous Q24H Ardis Rowan, PA-C   40 mg at 04/10/18 1040  . feeding supplement (ENSURE ENLIVE) (ENSURE ENLIVE) liquid 237 mL  237 mL Oral BID BM Ollis, Brandi L, NP   237 mL at 04/10/18 1339  . guaiFENesin (MUCINEX) 12 hr tablet 1,200 mg  1,200 mg Oral BID Simonne Maffucci B, MD   1,200 mg at 04/10/18 1046  . levothyroxine (SYNTHROID, LEVOTHROID) tablet 50 mcg  50 mcg Oral QAC breakfast [name], MD   50 mcg at 04/10/18 0751  . MEDLINE mouth rinse  15 mL Mouth Rinse BID Ollis, Brandi L, NP   15 mL at 04/10/18 1045  . metoprolol tartrate (LOPRESSOR) tablet 12.5 mg  12.5 mg Oral BID [name], MD   12.5 mg at 04/10/18 1041  . metroNIDAZOLE (FLAGYL) IVPB 500 mg  500 mg Intravenous Q8H Desai, Rahul P, PA-C   Stopped at 04/10/18 1150  . oxyCODONE (Oxy IR/ROXICODONE) immediate release tablet 5 mg  5 mg Oral Q6H PRN Noe Gens L, NP   5 mg at 04/10/18 0651  . polyethylene glycol (MIRALAX / GLYCOLAX) packet 17 g  17 g Oral Daily PRN Sampson Goon, MD   17 g at 04/07/18 1249     Discharge Medications: Please see discharge summary for a list of discharge medications.  Relevant Imaging Results:  Relevant Lab Results:   Additional Information SS# [place], [place]

## 2018-04-10 NOTE — Progress Notes (Signed)
Referring Physician(s): CCM/Yacoub  Supervising Physician: Marybelle Killings  Patient Status:  [facility] - In-pt  Chief Complaint:  Left empyema  Subjective:  Chest tube drain placed 5/5 1L serous fluid in Pleurvac (750) Feeling better today  Allergies: Codeine and Penicillins  Medications: Prior to Admission medications   Medication Sig Start Date End Date Taking? Authorizing Provider  albuterol (PROVENTIL HFA;VENTOLIN HFA) 108 (90 BASE) MCG/ACT inhaler Inhale 2 puffs into the lungs every 6 (six) hours as needed for wheezing or shortness of breath.    Yes [provider]  albuterol (PROVENTIL) (2.5 MG/3ML) 0.083% nebulizer solution Take 2.5 mg by nebulization every 6 (six) hours as needed for wheezing.   Yes [provider]  amLODipine (NORVASC) 5 MG tablet Take 5 mg by mouth daily.   Yes [provider]  aspirin EC 81 MG tablet Take 81 mg by mouth daily.   Yes [provider]  atorvastatin (LIPITOR) 20 MG tablet Take 20 mg by mouth daily.   Yes [provider]  carvedilol (COREG) 25 MG tablet Take 25 mg by mouth 2 (two) times daily with a meal.   Yes [provider]  Cholecalciferol (VITAMIN D-3 PO) Take 1 capsule by mouth daily.   Yes [provider]  Cyanocobalamin (VITAMIN B-12 PO) Take 500 mcg by mouth daily.    Yes [provider]  doxazosin (CARDURA) 4 MG tablet Take 4 mg by mouth at bedtime.    Yes [provider]  fluticasone (FLONASE) 50 MCG/ACT nasal spray Place 2 sprays into the nose daily as needed for allergies or rhinitis.    Yes [provider]  FOLIC ACID PO Take 1 tablet by mouth daily.    Yes [provider]  gabapentin (NEURONTIN) 300 MG capsule Take 300 mg by mouth 3 (three) times daily.    Yes [provider]  levothyroxine (SYNTHROID, LEVOTHROID) 50 MCG tablet Take 50 mcg by mouth daily.   Yes [provider]  Liniments (SALONPAS PAIN RELIEF PATCH  EX) Apply 1 patch topically daily as needed (pain).   Yes [provider]  losartan-hydrochlorothiazide (HYZAAR) 50-12.5 MG per tablet Take 1 tablet by mouth daily.   Yes [provider]  traMADol (ULTRAM) 50 MG tablet Take 1 tablet (50 mg total) by mouth every 6 (six) hours as needed. Patient taking differently: Take 50 mg by mouth See admin instructions. Take 50 mg by mouth once a day as needed for pain and 50 mg at bedtime scheduled 03/17/18  Yes Daleen Bo, MD  trolamine salicylate (ASPERCREME) 10 % cream Apply 1 application topically as needed (pain).   Yes [provider]  cefdinir (OMNICEF) 300 MG capsule Take 1 capsule (300 mg total) by mouth 2 (two) times daily. Patient not taking: Reported on 04/01/2018 05/17/13   Orpah Greek, MD     Vital Signs: BP (!) 130/51 (BP Location: Left Arm)   Pulse 94   Temp 98.4 F (36.9 C) (Oral)   Resp 16   Ht 5' 2.5" (1.588 m)   Wt 205 lb 11 oz (93.3 kg)   SpO2 97%   BMI 37.02 kg/m   Physical Exam  Pulmonary/Chest: Effort normal. She has rales.  Neurological: She is alert.  Skin: Skin is dry.  Site of CT is clean and dry NT No bleeding No air leak 1 L serous fluid in [place]   Nursing note and vitals reviewed.  Imaging: Dg Chest 2 View  Result Date: 04/09/2018 CLINICAL DATA:  Status post left chest tube placement EXAM: CHEST - 2 VIEW COMPARISON:  04/08/2018 FINDINGS: Cardiac shadow remains enlarged but stable. Left chest tube is again identified. No definitive pneumothorax is seen. The left-sided pleural effusion and basilar consolidation is again identified. Small right-sided pleural effusion is seen. No acute bony abnormality is noted. IMPRESSION: Stable appearance of the chest when compared with the previous exam. Electronically Signed   By: Inez Catalina M.D.   On: 04/09/2018 09:41   Ct Chest W Contrast  Result Date: 04/07/2018 CLINICAL DATA:  Left  pneumonia and empyema with limited thoracentesis demonstrating gram-negative rods in left pleural fluid. Evaluation by chest CT to determine approach for possible percutaneous pleural drainage catheter placement. EXAM: CT CHEST WITH CONTRAST TECHNIQUE: Multidetector CT imaging of the chest was performed during intravenous contrast administration. CONTRAST:  56 mL ISOVUE-300 IOPAMIDOL (ISOVUE-300) INJECTION 61% COMPARISON:  CT of the chest on 04/02/2018 FINDINGS: Cardiovascular: The heart size is stable and within normal limits. Stable coronary artery calcified plaque in a 3 vessel distribution. Trace pericardial fluid. Mediastinum/Nodes: No enlarged mediastinal or hilar lymph nodes. Stable small mediastinal nodes. Lungs/Pleura: Aeration of the lingula has improved since the prior study. There remains dense consolidation of the left lower lobe with associated pleural effusion. Volume of pleural fluid in the upper hemithorax shows slight enlargement and is predominantly posterior. There is a small amount of fluid in the major fissure. Small loculation is seen laterally towards the base. There is an additional fairly stable basilar/subpulmonic component. No significant pleural enhancement or suggestion of any pleural masses by CT. No pneumothorax or discrete pulmonary nodules identified. Upper Abdomen: No acute abnormality. Musculoskeletal: No chest wall abnormality. No acute or significant osseous findings. IMPRESSION: Slight enlargement in loculated left pleural effusion along the posterior upper hemithorax compared to the prior CT. This would likely be the most appropriate area to target for percutaneous catheter drainage. This component also appears to be in communication with the fluid extending into the major fissure. The basilar/subpulmonic component and small lateral loculated component appears stable. Stable dense consolidation of the left lower lobe. Aeration of the lingula appears slightly improved since the  prior CT. Electronically Signed   By: Aletta Edouard M.D.   On: 04/07/2018 08:21   Dg Chest Port 1 View  Result Date: 04/08/2018 CLINICAL DATA:  Follow-up left-sided empyema with chest tube treatment. EXAM: PORTABLE CHEST 1 VIEW COMPARISON:  Chest x-ray of Apr 07, 2018 FINDINGS: A small caliber chest tube has been placed on the left. The tip projects just above the aortic arch. There is a less than 5% left apical pneumothorax. There is persistent increased density at the left lung base with obscuration of the left hemidiaphragm. There is increased density in the right infrahilar region compatible with pneumonia and small effusion. There is calcification in the wall of the aortic arch. The trachea is midline. The left heart border is partially obscured. The pulmonary vascularity is not clearly engorged. IMPRESSION: Persistent left basilar atelectasis or pneumonia with left pleural effusion-empyema. Newly placed left sided chest tube. Less than 5% left apical pneumothorax appears new. Increasing density at the right lung base worrisome for atelectasis or pneumonia. Thoracic aortic atherosclerosis. Electronically Signed   By: David  Martinique M.D.   On: 04/08/2018 09:38   Dg Chest Port 1 View  Result Date: 04/07/2018 CLINICAL DATA:  84 year old female with history of pneumonia. EXAM: PORTABLE CHEST 1 VIEW COMPARISON:  Chest x-ray 04/05/2018. FINDINGS: Complete opacification throughout the mid to lower left hemithorax which may reflect atelectasis and/or consolidation with superimposed large left pleural effusion. Right lung appears clear. No right pleural effusion. No evidence of pulmonary edema. No pneumothorax. Heart size is borderline enlarged. Upper mediastinal contours are within normal limits. Aortic atherosclerosis. IMPRESSION: 1. Atelectasis and/or consolidation throughout the left lung base with large left parapneumonic pleural effusion. 2. Aortic atherosclerosis. Electronically Signed   By: Vinnie Langton M.D.   On: 04/07/2018 09:09   Ct Image Guided Drainage By Percutaneous Catheter  Result Date: 04/07/2018 CLINICAL DATA:  Left lung pneumonia with associated empyema. EXAM: CT GUIDED CATHETER DRAINAGE OF LEFT PLEURAL EMPYEMA ANESTHESIA/SEDATION: 1.0 mg IV Versed 25 mcg IV Fentanyl Total Moderate Sedation Time:  22 minutes The patient's level of consciousness and physiologic status were continuously monitored during the procedure by Radiology nursing. PROCEDURE: The procedure, risks, benefits, and alternatives were explained to the patient's daughter. Questions regarding the procedure were encouraged and answered. The patient's daughter understands and consents to the procedure. A time out was performed prior to initiating the procedure. CT was performed in prone followed by decubitus positions with the left side up. The posterior left chest wall was prepped with chlorhexidine in a sterile fashion, and a sterile drape was applied covering the operative field. A sterile gown and sterile gloves were used for the procedure. Local anesthesia was provided with 1% Lidocaine. An 18 gauge trocar needle was advanced into the posterior left pleural space under CT guidance. Fluid was aspirated. A guidewire was advanced through the needle. The needle was removed. The tract was dilated and a 12 French pigtail drainage catheter advanced into the pleural space. Catheter positioning was confirmed by CT. The catheter was connected to a [employer] device. The catheter exit site was secured with a Prolene retention suture and Vaseline gauze dressing. COMPLICATIONS: None FINDINGS: Initial prone imaging showed movement of pleural fluid anteriorly in the left pleural space. From a decubitus position, the pleural fluid was able to be accessed to allow pleural drainage catheter placement. There was return of amber colored fluid. IMPRESSION: CT-guided placement of left-sided 12 French pleural drain from a posterior approach  into a pleural empyema. The drain was connected to a Pleur-evac device which will be connected to wall suction at -20 cm of water. Electronically Signed   By: Aletta Edouard M.D.   On: 04/07/2018 13:43    Labs:  CBC: Recent Labs    04/04/18 0336 04/04/18 1022 04/07/18 0407 04/09/18 0424  WBC 21.8* 23.5* 12.5* 12.7*  HGB 8.7* 9.6* 7.9* 8.1*  HCT 26.6* 29.1* 24.8* 25.3*  PLT 309 327 439* 434*    COAGS: Recent Labs    04/01/18 1928  INR 1.36  APTT 39*    BMP: Recent Labs    04/04/18 1022 04/06/18 1141 04/07/18 0407 04/08/18 0435 04/09/18 0424  NA 140 140 143  --  139  K 4.3 3.6 3.4*  --  3.6  CL 110 111 111  --  107  CO2 19* 17* 22  --  26  GLUCOSE 97 135* 142*  --  112*  BUN 21* 13 13  --  6  CALCIUM 8.8* 9.1 8.7*  --  8.2*  CREATININE 1.22* 1.01* 0.90 0.82 0.78  GFRNONAA 38* 48* 55* >60 >60  GFRAA 44* 56* >60 >60 >60    LIVER FUNCTION TESTS: Recent Labs    04/01/18 1928  BILITOT 1.5*  AST 29  ALT 16  ALKPHOS 106  PROT 6.9  ALBUMIN 2.3*    Assessment and Plan:  Left empyema Some better CT in place  Electronically Signed: Makenzye Troutman A, PA-C 04/10/2018, 8:49 AM   I spent a total of 15 Minutes at the the patient's bedside AND on the patient's hospital floor or unit, greater than 50% of which was counseling/coordinating care for left empyema CT drain

## 2018-04-10 NOTE — Progress Notes (Signed)
Chest tube unclamped at 1330hrs

## 2018-04-11 ENCOUNTER — Inpatient Hospital Stay (HOSPITAL_COMMUNITY): Payer: Medicare Other

## 2018-04-11 DIAGNOSIS — J869 Pyothorax without fistula: Secondary | ICD-10-CM

## 2018-04-11 MED ORDER — POTASSIUM CHLORIDE CRYS ER 20 MEQ PO TBCR
40.0000 meq | EXTENDED_RELEASE_TABLET | Freq: Once | ORAL | Status: AC
  Administered 2018-04-11: 40 meq via ORAL
  Filled 2018-04-11: qty 2

## 2018-04-11 MED ORDER — FUROSEMIDE 10 MG/ML IJ SOLN
40.0000 mg | Freq: Once | INTRAMUSCULAR | Status: AC
  Administered 2018-04-11: 40 mg via INTRAVENOUS
  Filled 2018-04-11: qty 4

## 2018-04-11 MED ORDER — LEVALBUTEROL HCL 1.25 MG/0.5ML IN NEBU
1.2500 mg | INHALATION_SOLUTION | Freq: Three times a day (TID) | RESPIRATORY_TRACT | Status: DC
  Administered 2018-04-11 – 2018-04-13 (×8): 1.25 mg via RESPIRATORY_TRACT
  Filled 2018-04-11 (×8): qty 0.5

## 2018-04-11 NOTE — Progress Notes (Signed)
Occupational Therapy Treatment Patient Details Name: Monique Lin MRN: [id number] DOB: 28-Dec-1926 Today's Date: 04/11/2018    History of present illness 84 y/o F admitted 4/29 with 2 week hx of progressive SOB.  Seen 4/21 and treated for bronchitis with prednisone + antihistamines without improvement. CXR clear at that time.  Returned 4/29 with generalized weakness, decreased appetite and AMS.  Work up concerning for LLL opacity with associated pleural effusion & hypotension requiring vasopressors.  CXR has had intermittent white out on left despite bronchoscopy and attempts at thoracentesis (limited fluid returned).  Bronchoscopy demonstrated bronchomalacia   OT comments  Pt required max encouragement to participate in OOB activity. Pt participated in toileting on BSC, bathing and dressing. OT will continue to follow acutely  Follow Up Recommendations  SNF;Supervision/Assistance - 24 hour    Equipment Recommendations  3 in 1 bedside commode    Recommendations for Other Services      Precautions / Restrictions Precautions Precautions: Fall Restrictions Weight Bearing Restrictions: No       Mobility Bed Mobility Overal bed mobility: Needs Assistance Bed Mobility: Sit to Supine     Supine to sit: Max assist;+2 for physical assistance     General bed mobility comments: assist with LEs and to elevate trunk to sit EOB  Transfers Overall transfer level: Needs assistance Equipment used: Rolling walker (2 wheeled) Transfers: Sit to/from Stand Sit to Stand: Mod assist;+2 physical assistance Stand pivot transfers: Mod assist;+2 physical assistance       General transfer comment: Cues for hand placement to and from seated surface.  Pt slow to ascend and intially reaches for RW for support.     Balance Overall balance assessment: Needs assistance Sitting-balance support: No upper extremity supported;Feet supported Sitting balance-Leahy Scale: Fair     Standing balance support:  Bilateral upper extremity supported;During functional activity Standing balance-Leahy Scale: Poor                             ADL either performed or assessed with clinical judgement   ADL       Grooming: Min guard;Sitting   Upper Body Bathing: Minimal assistance;Sitting   Lower Body Bathing: Maximal assistance;Sitting/lateral leans;Sit to/from stand   Upper Body Dressing : Sitting;Minimal assistance           Toileting- Clothing Manipulation and Hygiene: Total assistance;Sit to/from stand         General ADL Comments: pt required max encouragement to participate in toileting on [facility] and bathing/dressing     Vision Patient Visual Report: No change from baseline     Perception     Praxis      Cognition Arousal/Alertness: Awake/alert Behavior During Therapy: WFL for tasks assessed/performed Overall Cognitive Status: No family/caregiver present to determine baseline cognitive functioning Area of Impairment: Attention;Following commands;Safety/judgement;Problem solving                       Following Commands: Follows one step commands with increased time Safety/Judgement: Decreased awareness of safety   Problem Solving: Slow processing General Comments: Pt requiring increased time to follow commands throughout session today.        Exercises     Shoulder Instructions       General Comments      Pertinent Vitals/ Pain       Pain Assessment: Faces Faces Pain Scale: Hurts little more Pain Location: R LE Pain Descriptors / Indicators: Discomfort;Grimacing;Guarding Pain Intervention(s):  Monitored during session;Repositioned  Home Living                                          Prior Functioning/Environment              Frequency  Min 2X/week        Progress Toward Goals  OT Goals(current goals can now be found in the care plan section)  Progress towards OT goals: Progressing toward goals     Plan  Discharge plan remains appropriate    Co-evaluation                 AM-PAC PT "6 Clicks" Daily Activity     Outcome Measure   Help from another person eating meals?: A Little Help from another person taking care of personal grooming?: A Little Help from another person toileting, which includes using toliet, bedpan, or urinal?: A Lot Help from another person bathing (including washing, rinsing, drying)?: A Lot Help from another person to put on and taking off regular upper body clothing?: A Lot Help from another person to put on and taking off regular lower body clothing?: Total 6 Click Score: 13    End of Session Equipment Utilized During Treatment: Rolling walker;Gait belt;Other (comment)(BSC)  OT Visit Diagnosis: Other abnormalities of gait and mobility (R26.89);Pain;Muscle weakness (generalized) (M62.81) Pain - Right/Left: Right Pain - part of body: Leg   Activity Tolerance Patient tolerated treatment well   Patient Left with call bell/phone within reach;in chair;with chair alarm set   Nurse Communication      Functional Assessment Tool Used: AM-PAC 6 Clicks Daily Activity   Time: 1030-1109 OT Time Calculation (min): 39 min  Charges: OT G-codes **NOT FOR INPATIENT CLASS** Functional Assessment Tool Used: AM-PAC 6 Clicks Daily Activity OT General Charges $OT Visit: 1 Visit OT Treatments $Self Care/Home Management : 23-37 mins $Therapeutic Activity: 8-22 mins     Britt Bottom 04/11/2018, 1:40 PM

## 2018-04-11 NOTE — Progress Notes (Signed)
Referring Physician(s): CCM/Yacoub  Supervising Physician: Jacqulynn Cadet  Patient Status:  [facility] - In-pt  Chief Complaint: Left empyema  Subjective: Becoming less sore with better pain control related to tube.   Allergies: Codeine and Penicillins  Medications: Prior to Admission medications   Medication Sig Start Date End Date Taking? Authorizing Provider  albuterol (PROVENTIL HFA;VENTOLIN HFA) 108 (90 BASE) MCG/ACT inhaler Inhale 2 puffs into the lungs every 6 (six) hours as needed for wheezing or shortness of breath.    Yes [provider]  albuterol (PROVENTIL) (2.5 MG/3ML) 0.083% nebulizer solution Take 2.5 mg by nebulization every 6 (six) hours as needed for wheezing.   Yes [provider]  amLODipine (NORVASC) 5 MG tablet Take 5 mg by mouth daily.   Yes [provider]  aspirin EC 81 MG tablet Take 81 mg by mouth daily.   Yes [provider]  atorvastatin (LIPITOR) 20 MG tablet Take 20 mg by mouth daily.   Yes [provider]  carvedilol (COREG) 25 MG tablet Take 25 mg by mouth 2 (two) times daily with a meal.   Yes [provider]  Cholecalciferol (VITAMIN D-3 PO) Take 1 capsule by mouth daily.   Yes [provider]  Cyanocobalamin (VITAMIN B-12 PO) Take 500 mcg by mouth daily.    Yes [provider]  doxazosin (CARDURA) 4 MG tablet Take 4 mg by mouth at bedtime.    Yes [provider]  fluticasone (FLONASE) 50 MCG/ACT nasal spray Place 2 sprays into the nose daily as needed for allergies or rhinitis.    Yes [provider]  FOLIC ACID PO Take 1 tablet by mouth daily.    Yes [provider]  gabapentin (NEURONTIN) 300 MG capsule Take 300 mg by mouth 3 (three) times daily.    Yes [provider]  levothyroxine (SYNTHROID, LEVOTHROID) 50 MCG tablet Take 50 mcg by mouth daily.   Yes [provider]  Liniments (SALONPAS PAIN RELIEF PATCH EX) Apply 1 patch  topically daily as needed (pain).   Yes [provider]  losartan-hydrochlorothiazide (HYZAAR) 50-12.5 MG per tablet Take 1 tablet by mouth daily.   Yes [provider]  traMADol (ULTRAM) 50 MG tablet Take 1 tablet (50 mg total) by mouth every 6 (six) hours as needed. Patient taking differently: Take 50 mg by mouth See admin instructions. Take 50 mg by mouth once a day as needed for pain and 50 mg at bedtime scheduled 03/17/18  Yes Daleen Bo, MD  trolamine salicylate (ASPERCREME) 10 % cream Apply 1 application topically as needed (pain).   Yes [provider]  cefdinir (OMNICEF) 300 MG capsule Take 1 capsule (300 mg total) by mouth 2 (two) times daily. Patient not taking: Reported on 04/01/2018 05/17/13   Orpah Greek, MD     Vital Signs: BP (!) 135/54 (BP Location: Left Arm)   Pulse 89   Temp 98.8 F (37.1 C) (Oral)   Resp 17   Ht 5' 2.5" (1.588 m)   Wt 210 lb 5.1 oz (95.4 kg)   SpO2 100%   BMI 37.85 kg/m   Physical Exam  Constitutional: She is oriented to person, place, and time.  Cardiovascular: Normal rate.  Pulmonary/Chest: Effort normal. No respiratory distress.  Chest tube in place.  Now with 1.3L of output in pleurvac, largely appears serous.   Neurological: She is alert and oriented to person, place, and time.  Skin: Skin is dry.  Psychiatric: She  has a normal mood and affect. Her behavior is normal.  Nursing note and vitals reviewed.   Imaging: Dg Chest 2 View  Result Date: 04/09/2018 CLINICAL DATA:  Status post left chest tube placement EXAM: CHEST - 2 VIEW COMPARISON:  04/08/2018 FINDINGS: Cardiac shadow remains enlarged but stable. Left chest tube is again identified. No definitive pneumothorax is seen. The left-sided pleural effusion and basilar consolidation is again identified. Small right-sided pleural effusion is seen. No acute bony abnormality is noted. IMPRESSION: Stable appearance of the chest when compared with the  previous exam. Electronically Signed   By: Inez Catalina M.D.   On: 04/09/2018 09:41   Dg Chest Port 1 View  Result Date: 04/11/2018 CLINICAL DATA:  Respiratory failure, cough. EXAM: PORTABLE CHEST 1 VIEW COMPARISON:  Radiograph of Apr 10, 2018. FINDINGS: Stable cardiomegaly. Left-sided chest tube is unchanged in position. No definite pneumothorax is noted. Stable moderate left pleural effusion is noted with probable underlying atelectasis or infiltrate. Stable mild right pleural effusion is noted with associated atelectasis. Bony thorax is unremarkable. IMPRESSION: Stable position of left-sided chest tube with no definite pneumothorax. Stable moderate left pleural effusion is noted with probable underlying atelectasis or infiltrate. Stable mild right pleural effusion is noted with associated atelectasis. Electronically Signed   By: Marijo Conception, M.D.   On: 04/11/2018 10:31   Dg Chest Port 1 View  Result Date: 04/10/2018 CLINICAL DATA:  Followup left empyema and chest tube. EXAM: PORTABLE CHEST 1 VIEW COMPARISON:  04/09/2018 and older exams. FINDINGS: Opacity of the left appears mildly increased consistent with an increase in pleural fluid. A past at the right lung base is similar to previous day's study consistent with a small pleural effusion. Lung base parenchymal opacity is noted consistent with atelectasis of the right and either atelectasis pneumonia or comminution on the left. Left upper a door X pigtail chest tube is stable.  No pneumothorax. IMPRESSION: 1. Left pleural effusion has mildly increased from the previous exam. No other change. 2. Persistent left lung base consolidation. 3. No change in the left chest tube and no pneumothorax visualized. Electronically Signed   By: Lajean Manes M.D.   On: 04/10/2018 09:51   Dg Chest Port 1 View  Result Date: 04/08/2018 CLINICAL DATA:  Follow-up left-sided empyema with chest tube treatment. EXAM: PORTABLE CHEST 1 VIEW COMPARISON:  Chest x-ray of Apr 07, 2018 FINDINGS: A small caliber chest tube has been placed on the left. The tip projects just above the aortic arch. There is a less than 5% left apical pneumothorax. There is persistent increased density at the left lung base with obscuration of the left hemidiaphragm. There is increased density in the right infrahilar region compatible with pneumonia and small effusion. There is calcification in the wall of the aortic arch. The trachea is midline. The left heart border is partially obscured. The pulmonary vascularity is not clearly engorged. IMPRESSION: Persistent left basilar atelectasis or pneumonia with left pleural effusion-empyema. Newly placed left sided chest tube. Less than 5% left apical pneumothorax appears new. Increasing density at the right lung base worrisome for atelectasis or pneumonia. Thoracic aortic atherosclerosis. Electronically Signed   By: David  Martinique M.D.   On: 04/08/2018 09:38   Ct Image Guided Drainage By Percutaneous Catheter  Result Date: 04/07/2018 CLINICAL DATA:  Left lung pneumonia with associated empyema. EXAM: CT GUIDED CATHETER DRAINAGE OF LEFT PLEURAL EMPYEMA ANESTHESIA/SEDATION: 1.0 mg IV Versed 25 mcg IV Fentanyl Total Moderate Sedation Time:  22  minutes The patient's level of consciousness and physiologic status were continuously monitored during the procedure by Radiology nursing. PROCEDURE: The procedure, risks, benefits, and alternatives were explained to the patient's daughter. Questions regarding the procedure were encouraged and answered. The patient's daughter understands and consents to the procedure. A time out was performed prior to initiating the procedure. CT was performed in prone followed by decubitus positions with the left side up. The posterior left chest wall was prepped with chlorhexidine in a sterile fashion, and a sterile drape was applied covering the operative field. A sterile gown and sterile gloves were used for the procedure. Local anesthesia was  provided with 1% Lidocaine. An 18 gauge trocar needle was advanced into the posterior left pleural space under CT guidance. Fluid was aspirated. A guidewire was advanced through the needle. The needle was removed. The tract was dilated and a 12 French pigtail drainage catheter advanced into the pleural space. Catheter positioning was confirmed by CT. The catheter was connected to a [employer] device. The catheter exit site was secured with a Prolene retention suture and Vaseline gauze dressing. COMPLICATIONS: None FINDINGS: Initial prone imaging showed movement of pleural fluid anteriorly in the left pleural space. From a decubitus position, the pleural fluid was able to be accessed to allow pleural drainage catheter placement. There was return of amber colored fluid. IMPRESSION: CT-guided placement of left-sided 12 French pleural drain from a posterior approach into a pleural empyema. The drain was connected to a Pleur-evac device which will be connected to wall suction at -20 cm of water. Electronically Signed   By: Aletta Edouard M.D.   On: 04/07/2018 13:43    Labs:  CBC: Recent Labs    04/04/18 0336 04/04/18 1022 04/07/18 0407 04/09/18 0424  WBC 21.8* 23.5* 12.5* 12.7*  HGB 8.7* 9.6* 7.9* 8.1*  HCT 26.6* 29.1* 24.8* 25.3*  PLT 309 327 439* 434*    COAGS: Recent Labs    04/01/18 1928  INR 1.36  APTT 39*    BMP: Recent Labs    04/04/18 1022 04/06/18 1141 04/07/18 0407 04/08/18 0435 04/09/18 0424  NA 140 140 143  --  139  K 4.3 3.6 3.4*  --  3.6  CL 110 111 111  --  107  CO2 19* 17* 22  --  26  GLUCOSE 97 135* 142*  --  112*  BUN 21* 13 13  --  6  CALCIUM 8.8* 9.1 8.7*  --  8.2*  CREATININE 1.22* 1.01* 0.90 0.82 0.78  GFRNONAA 38* 48* 55* >60 >60  GFRAA 44* 56* >60 >60 >60    LIVER FUNCTION TESTS: Recent Labs    04/01/18 1928  BILITOT 1.5*  AST 29  ALT 16  ALKPHOS 106  PROT 6.9  ALBUMIN 2.3*    Assessment and Plan: Left empyema S/p chest tube  placement 5/5.  Draining small amount of serous/serosanguinous fluid daily with assistance from intrapleural thrombolytics. Per TCTS note, considering repeat CT.  IR following.  Electronically Signed: Docia Barrier, PA 04/11/2018, 11:34 AM   I spent a total of 15 Minutes at the the patient's bedside AND on the patient's hospital floor or unit, greater than 50% of which was counseling/coordinating care for left empyema CT drain

## 2018-04-11 NOTE — Consult Note (Signed)
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  HPI: 84 yo woman with past history of hypertension, hyperlipidemia, hypothyroidism, morbid obesity gastric tumor and bronchitis. Presented 4/29 with cough, left sided pleuritic CP, shortness of breath and general malaise. Was found to have a loculated pleural effusion and WBC of 26K. Started on IV antibiotics. CT showed a loculated effusion. Thoracentesis drained a small amount of fluid and cultures grew GNR. Effusion progressed and IR placed a pigtail catheter on 5/5. CCM has started thrombolytics.   She still has left sided Cp and shortness of breath although better than on admission.  Past Medical History:  Diagnosis Date  . Bronchitis   . Hypertension   . Leg pain   . Stomach tumor (benign)     Past Surgical History:  Procedure Laterality Date  . IR THORACENTESIS ASP PLEURAL SPACE W/IMG GUIDE  04/05/2018  . NASAL SINUS SURGERY    . stomach tumor removal      History reviewed. No pertinent family history.  Social History:  reports that she has never smoked. She has never used smokeless tobacco. She reports that she does not drink alcohol or use drugs.  Allergies:  Allergies  Allergen Reactions  . Codeine Other (See Comments)    Stomach cramps  . Penicillins Other (See Comments)    "Bad stomach cramps" Has patient had a PCN reaction causing immediate rash, facial/tongue/throat swelling, SOB or lightheadedness with hypotension: Unk Has patient had a PCN reaction causing severe rash involving mucus membranes or skin necrosis: Unk Has patient had a PCN reaction that required hospitalization: Unk Has patient had a PCN reaction occurring within the last 10 years: No If all of the above answers are "NO", then may proceed with Cephalosporin use.      Medications:  Scheduled: . alteplase (TPA) for intrapleural administration  10 mg Intrapleural Daily   And  . pulmozyme (DORNASE) for  intrapleural administration  5 mg Intrapleural Daily  . aspirin  81 mg Oral Daily  . docusate sodium  100 mg Oral Daily  . enoxaparin (LOVENOX) injection  40 mg Subcutaneous Q24H  . feeding supplement (ENSURE ENLIVE)  237 mL Oral BID BM  . guaiFENesin  1,200 mg Oral BID  . levothyroxine  50 mcg Oral QAC breakfast  . mouth rinse  15 mL Mouth Rinse BID  . metoprolol tartrate  12.5 mg Oral BID    No results found for this or any previous visit (from the past 48 hour(s)).  Dg Chest Port 1 View  Result Date: 04/10/2018 CLINICAL DATA:  Followup left empyema and chest tube. EXAM: PORTABLE CHEST 1 VIEW COMPARISON:  04/09/2018 and older exams. FINDINGS: Opacity of the left appears mildly increased consistent with an increase in pleural fluid. A past at the right lung base is similar to previous day's study consistent with a small pleural effusion. Lung base parenchymal opacity is noted consistent with atelectasis of the right and either atelectasis pneumonia or comminution on the left. Left upper a door X pigtail chest tube is stable.  No pneumothorax. IMPRESSION: 1. Left pleural effusion has mildly increased from the previous exam. No other change. 2. Persistent left lung base consolidation. 3. No change in the left chest tube and no pneumothorax visualized. Electronically Signed   By: Lajean Manes M.D.   On: 04/10/2018 09:51   I personally reviewed her CXR and CT images and concur with the findings noted above  Review of  Systems  Constitutional: Positive for malaise/fatigue. Negative for fever.  Respiratory: Positive for cough, shortness of breath and wheezing.   Cardiovascular: Positive for chest pain (left sided pleuritic).  Neurological: Positive for dizziness.   Blood pressure (!) 135/54, pulse 89, temperature 98.8 F (37.1 C), temperature source Oral, resp. rate 17, height 5' 2.5" (1.588 m), weight 210 lb 5.1 oz (95.4 kg), SpO2 100 %. Physical Exam  Vitals reviewed. Constitutional: She is  oriented to person, place, and time. No distress.  elderly  HENT:  Head: Normocephalic and atraumatic.  Mouth/Throat: No oropharyngeal exudate.  Eyes: Conjunctivae and EOM are normal. No scleral icterus.  Neck: Neck supple.  Cardiovascular: Normal rate and regular rhythm.  Murmur (faint systolic) heard. Respiratory: No respiratory distress. She has no wheezes.  Absent BS left base  GI: Soft. She exhibits no distension. There is no tenderness.  Lymphadenopathy:    She has no cervical adenopathy.  Neurological: She is alert and oriented to person, place, and time. No cranial nerve deficit.    Assessment/Plan: 84 yo woman with a history of hypertension, hyperlipidemia, hypothyroidism, obesity and bronchitis. Presented with pneumonia complicated by an empyema. She has been treated appropriately with IV antibiotics. She had a pigtail catheter placed and has received intrapleuraal thrombolytics.   On CT she had a loculated effusion with superior and inferior components and dense left lower lobe consolidation and atelectasis. The pigtail has effectively drained the superior collection but the inferior collection appears to still be present. A CT could help clarify how much of the left basilar opacity is effusion and how much is atelectasis.   She will likely need a basilar tube and thrombolytics to clear the remaining fluid.  Monique Lin 04/11/2018, 9:39 AM

## 2018-04-11 NOTE — Progress Notes (Signed)
Subjective: Appreciate CVTS consult Patient complains of pleuritic left-sided chest pain associated shortness of breath  Objective:  Vital Signs in the last 24 hours: Temp:  [97.9 F (36.6 C)-98.8 F (37.1 C)] 98.8 F (37.1 C) (05/09 0531) Pulse Rate:  [82-94] 89 (05/09 0531) Resp:  [12-29] 17 (05/09 0531) BP: (99-135)/(46-61) 135/54 (05/09 0531) SpO2:  [97 %-100 %] 100 % (05/09 0531) Weight:  [95.4 kg (210 lb 5.1 oz)] 95.4 kg (210 lb 5.1 oz) (05/09 0531)  Intake/Output from previous day: 05/08 0701 - 05/09 0700 In: 60 [P.O.:60] Out: 1110 [Urine:750; Chest Tube:360] Intake/Output from this shift: No intake/output data recorded.  Physical Exam: Neck: no adenopathy, no carotid bruit, no JVD and supple, symmetrical, trachea midline Lungs: decreased breath sound at bases left more than right with occasional rhonchi left lung Heart: regular rate and rhythm, S1, S2 normal and soft systolic murmur noted no pericardial rub Abdomen: soft, non-tender; bowel sounds normal; no masses,  no organomegaly Extremities: extremities normal, atraumatic, no cyanosis or edema  Lab Results: Recent Labs    04/09/18 0424  WBC 12.7*  HGB 8.1*  PLT 434*   Recent Labs    04/09/18 0424  NA 139  K 3.6  CL 107  CO2 26  GLUCOSE 112*  BUN 6  CREATININE 0.78   No results for input(s): TROPONINI in the last 72 hours.  Invalid input(s): CK, MB Hepatic Function Panel No results for input(s): PROT, ALBUMIN, AST, ALT, ALKPHOS, BILITOT, BILIDIR, IBILI in the last 72 hours. No results for input(s): CHOL in the last 72 hours. No results for input(s): PROTIME in the last 72 hours.  Imaging: Imaging results have been reviewed and Dg Chest Port 1 View  Result Date: 04/11/2018 CLINICAL DATA:  Respiratory failure, cough. EXAM: PORTABLE CHEST 1 VIEW COMPARISON:  Radiograph of Apr 10, 2018. FINDINGS: Stable cardiomegaly. Left-sided chest tube is unchanged in position. No definite pneumothorax is noted.  Stable moderate left pleural effusion is noted with probable underlying atelectasis or infiltrate. Stable mild right pleural effusion is noted with associated atelectasis. Bony thorax is unremarkable. IMPRESSION: Stable position of left-sided chest tube with no definite pneumothorax. Stable moderate left pleural effusion is noted with probable underlying atelectasis or infiltrate. Stable mild right pleural effusion is noted with associated atelectasis. Electronically Signed   By: Marijo Conception, M.D.   On: 04/11/2018 10:31   Dg Chest Port 1 View  Result Date: 04/10/2018 CLINICAL DATA:  Followup left empyema and chest tube. EXAM: PORTABLE CHEST 1 VIEW COMPARISON:  04/09/2018 and older exams. FINDINGS: Opacity of the left appears mildly increased consistent with an increase in pleural fluid. A past at the right lung base is similar to previous day's study consistent with a small pleural effusion. Lung base parenchymal opacity is noted consistent with atelectasis of the right and either atelectasis pneumonia or comminution on the left. Left upper a door X pigtail chest tube is stable.  No pneumothorax. IMPRESSION: 1. Left pleural effusion has mildly increased from the previous exam. No other change. 2. Persistent left lung base consolidation. 3. No change in the left chest tube and no pneumothorax visualized. Electronically Signed   By: Lajean Manes M.D.   On: 04/10/2018 09:51    Cardiac Studies:  Assessment/Plan:  ResolvingLeft lung pneumoniacomplicated by empyemaStatus post bronchial noted to have bronchomalacia. Status post CT-guided drain left pleural space Status post septic shock. Status postacute renal injury History of hypertension Hyperlipidemia Hypothyroidism Acute on chronic anemia probably secondary to  hydration rule out GI loss History of bronchial asthma History of GI stromal tumor in the past History of GI bleed in the past Plan As per orders Lasix 40 mg IV 1 Xopenex as  per orders Check labs in a.m. Ambulate as tolerated   LOS: 10 days    [name] 04/11/2018, 11:03 AM

## 2018-04-11 NOTE — Clinical Social Work Note (Signed)
Clinical Social Work Assessment  Patient Details  Name: Monique Lin MRN: [id number] Date of Birth: 25-Aug-1927  Date of referral:  04/11/18               Reason for consult:  Facility Placement, Discharge Planning                Permission sought to share information with:  Facility Sport and exercise psychologist, Family Supports Permission granted to share information::  Yes, Verbal Permission Granted(but pt wants to discuss with daughters first)  Name::      [name] and Pharmacist, hospital::     Relationship::  daughters  Contact Information:   [name]  Housing/Transportation Living arrangements for the past 2 months:  [place] of Information:  Patient Patient Interpreter Needed:    Criminal Activity/Legal Involvement Pertinent to Current Situation/Hospitalization:  No - Comment as needed Significant Relationships:  Adult Children, Warehouse manager, [place], Other Family Members Lives with:  Adult Children Do you feel safe going back to the place where you live?  Yes Need for family participation in patient care:  Yes (Comment)  Care giving concerns:  Pt lives with daughters who work. Is requiring max assist for ambulation and most ADLs.   Social Worker assessment / plan:  CSW met with pt at bedside, pt was sitting up in chair and still has chest tube/cough. Pt was amenable to speaking with CSW, she states that she lives with one of her adult daughters and the other lives across the street. Both are supportive and assist her at home. Pt states she also has good support from church members but attends church in [place] so she generally is at home by herself during the day. Pt states that she uses a cane to get around in the community due to previously weak arthritic knee. Pt was mobilizing around her home by herself without the cane and states "i'm not a lady that sits around and watches tv all day."   CSW discussed recommendations for SNF placement with pt, she states  that she will need to discuss with her daughters and that they are not able to talk on the phone at work during the day.  Of note- pt and pt family would like pt to progress to HHPT per PT note, however this writer has been approached by therapy staff as well as Conservation officer, historic buildings that pt requests a high level of assistance for things such as pulling her blanket up, which when encouraged she is able to do independently. When this writer was completing assessment pt request CSW to wipe her mouth. CSW provided pt with wet cloth and encouragement and pt was able to wipe her mouth skillfully with independence. Therapy and RN staff worry about pt being along during the day if pt daughters are not present and being able to get around and take care of ADLs.  CSW will follow up as pt progresses medically in regards to discharge plans.    Employment status:  Retired Forensic scientist:  [employer] PT Recommendations:  [place], [place] / Referral to community resources:  [place]  Patient/Family's Response to care:  Pt would like to speak with daughters about recommendations, pt amenable to [place] visit and speaking with this Probation officer about current living situations and information about options moving forward.   Patient/Family's Understanding of and Emotional Response to Diagnosis, Current Treatment, and Prognosis:  Pt states understanding of diagnosis, current treatment, but perhaps does  not fully recognize her prognosis and need for assistance as she discharges. Pt was pleasant but not overly chatty during CSW visit, pt states that she wants to talk with daughters and they do not like calls during the day, she expressed that she had no further questions but as this writer left she said "I've never been in the hospital like this before this is all new to me." CSW provided validation that oftentimes we may not know what the future holds and what questions to ask but this  writer is available for support and guidance with discharge process.   Emotional Assessment Appearance:  Appears stated age Attitude/Demeanor/Rapport:  Gracious(Soft Spoken) Affect (typically observed):  Appropriate, Quiet, Pleasant Orientation:  Oriented to Self, Oriented to Place, Oriented to  Time, Oriented to Situation Alcohol / Substance use:  Not Applicable Psych involvement (Current and /or in the community):  No (Comment)  Discharge Needs  Concerns to be addressed:  Care Coordination Readmission within the last 30 days:  No Current discharge risk:  Physical Impairment, Dependent with Mobility Barriers to Discharge:  Continued Medical Work up   [employer], [place] 04/11/2018, 2:42 PM

## 2018-04-12 ENCOUNTER — Inpatient Hospital Stay (HOSPITAL_COMMUNITY): Payer: Medicare Other

## 2018-04-12 LAB — BASIC METABOLIC PANEL
ANION GAP: 11 (ref 5–15)
BUN: 10 mg/dL (ref 6–20)
CO2: 27 mmol/L (ref 22–32)
CREATININE: 1.13 mg/dL — AB (ref 0.44–1.00)
Calcium: 8.1 mg/dL — ABNORMAL LOW (ref 8.9–10.3)
Chloride: 103 mmol/L (ref 101–111)
GFR calc Af Amer: 49 mL/min — ABNORMAL LOW (ref >60)
GFR calc non Af Amer: 42 mL/min — ABNORMAL LOW (ref >60)
Glucose, Bld: 77 mg/dL (ref 65–99)
POTASSIUM: 3.6 mmol/L (ref 3.5–5.1)
Sodium: 141 mmol/L (ref 135–145)

## 2018-04-12 LAB — ECHOCARDIOGRAM COMPLETE
Height: 62.5 in
Weight: 3365.1 oz

## 2018-04-12 LAB — CBC
HEMATOCRIT: 25.5 % — AB (ref 36.0–46.0)
Hemoglobin: 8.4 g/dL — ABNORMAL LOW (ref 12.0–15.0)
MCH: 29.4 pg (ref 26.0–34.0)
MCHC: 32.9 g/dL (ref 30.0–36.0)
MCV: 89.2 fL (ref 78.0–100.0)
Platelets: 534 10*3/uL — ABNORMAL HIGH (ref 150–400)
RBC: 2.86 MIL/uL — ABNORMAL LOW (ref 3.87–5.11)
RDW: 14.9 % (ref 11.5–15.5)
WBC: 22.2 10*3/uL — AB (ref 4.0–10.5)

## 2018-04-12 MED ORDER — ADULT MULTIVITAMIN W/MINERALS CH
1.0000 | ORAL_TABLET | Freq: Every day | ORAL | Status: DC
Start: 2018-04-12 — End: 2018-04-25
  Administered 2018-04-12 – 2018-04-25 (×13): 1 via ORAL
  Filled 2018-04-12 (×16): qty 1

## 2018-04-12 MED ORDER — PRO-STAT SUGAR FREE PO LIQD
30.0000 mL | Freq: Three times a day (TID) | ORAL | Status: DC
  Administered 2018-04-12 – 2018-04-25 (×37): 30 mL via ORAL
  Filled 2018-04-12 (×37): qty 30

## 2018-04-12 MED ORDER — POTASSIUM CHLORIDE CRYS ER 20 MEQ PO TBCR
20.0000 meq | EXTENDED_RELEASE_TABLET | Freq: Every day | ORAL | Status: DC
  Administered 2018-04-14 – 2018-04-25 (×12): 20 meq via ORAL
  Filled 2018-04-12 (×2): qty 1
  Filled 2018-04-12: qty 2
  Filled 2018-04-12 (×10): qty 1
  Filled 2018-04-12: qty 2
  Filled 2018-04-12: qty 1

## 2018-04-12 MED ORDER — FUROSEMIDE 10 MG/ML IJ SOLN
40.0000 mg | Freq: Every day | INTRAMUSCULAR | Status: DC
  Administered 2018-04-14 – 2018-04-25 (×12): 40 mg via INTRAVENOUS
  Filled 2018-04-12 (×12): qty 4

## 2018-04-12 NOTE — Progress Notes (Signed)
PT Cancellation Note  Patient Details Name: Monique Lin MRN: [id number] DOB: August 06, 1927   Cancelled Treatment:    Reason Eval/Treat Not Completed: (P) Medical issues which prohibited therapy(Pt presents with decreased respiratory function.  )   Tarri Guilfoil Eli Hose 04/12/2018, 3:06 PM  Governor [place], PTA pager [phone]

## 2018-04-12 NOTE — Progress Notes (Signed)
  Echocardiogram 2D Echocardiogram has been performed.  Monique Lin 04/12/2018, 4:53 PM

## 2018-04-12 NOTE — Progress Notes (Signed)
CT chest reviewed.  Not sure if current pig tail is doing anything, and probably can be removed.  Might need additional pig tail by IR to lower fluid collection.  Will await review by TCTS to determine best approach for additional fluid drainage.  Chesley Mires, MD [facility] Pulmonary/Critical Care 04/12/2018, 11:09 AM

## 2018-04-12 NOTE — Progress Notes (Signed)
Initial Nutrition Assessment  DOCUMENTATION CODES:   Obesity unspecified  INTERVENTION:   -D/c Ensure Enlive po BID, each supplement provides 350 kcal and 20 grams of protein -30 ml Prostat TID, each supplement provides 100 kcals and 15 grams protein -MVI daily  NUTRITION DIAGNOSIS:   Inadequate oral intake related to decreased appetite as evidenced by meal completion < 50%, per patient/family report.  GOAL:   Patient will meet greater than or equal to 90% of their needs  MONITOR:   PO intake, Supplement acceptance, Labs, Weight trends, Skin, I & O's  REASON FOR ASSESSMENT:   LOS    ASSESSMENT:   84 year old female with past medical history significant for hypertension, hypothyroidism, hyperlipidemia, history of bronchial asthma, morbid obesity, history of GI stromal tumor in the past history of upper GI bleed in the past, came to the ER complaining of cough associated with left sided chest pain or difficulty breathing and generalized weakness and poor appetite for last 7 days.  Pt admitted with lt empyema.   4/30- s/p thoracentesis (aspirated minimal drainage- approximately 1/4 ml of very viscous material) 5/1- CXR revealed improved aeration in lt upper lobe  5/2- s/p bronch revealed bronchomalacia  5/3- s/p lt thoracentesis- 90 ml drained and sent to lab for analysis 5/5- s/p lt chest tube placement per IR  Reviewed I/O's: -1.8 L x 24 hours and +7.3 L since admission. Noted 40 ml chest tube output x 24 hours.   Reviewed CTS notes; pt not an operative candidate for formal decortication and will attempt left lower chest pigtail catheter drainage as a palliative maneuver.   RD pulled to pt chart due to LOS and also seen per request of mobility tech secondary to decreased oral intake and medical complexity.   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 I had a procedure where my stomach shrunk and I can't eat a lot". Pt reports that she was  consuming 3 meals per day PTA, but mainly consumes fruits and vegetables and minimal meat. She has been following a similar meal pattern here in the hospital. Noted meal completion 25-50%. Boost Breeze supplement at bedside was untouched; pt shares that she does not like the taste of sweet flavored foods and beverages ("I don't even eat cakes and pies at home"). She also has trialed Ensure and Boost supplements, which she does not like.   Pt denies any weight loss. Noted wt hx available to assess. She is unsure of UBW. Noted wt increase since admission, likely due to edema.   Discussed with pt importance of good nutritional intake to promote healing (especially protein). Pt amenable to prostat, due to dislike of other supplements trialed.   Per CSW notes, plan for SNF vs home health.  Labs reviewed.   NUTRITION - FOCUSED PHYSICAL EXAM:    Most Recent Value  Orbital Region  No depletion  Upper Arm Region  No depletion  Thoracic and Lumbar Region  No depletion  Buccal Region  No depletion  Temple Region  No depletion  Clavicle Bone Region  No depletion  Clavicle and Acromion Bone Region  No depletion  Scapular Bone Region  No depletion  Dorsal Hand  No depletion  Patellar Region  No depletion  Anterior Thigh Region  No depletion  Posterior Calf Region  No depletion  Edema (RD Assessment)  Mild  Hair  Reviewed  Eyes  Reviewed  Mouth  Reviewed  Skin  Reviewed  Nails  Reviewed  Diet Order:   Diet Order           Diet Heart Room service appropriate? Yes; Fluid consistency: Thin  Diet effective now          EDUCATION NEEDS:   Education needs have been addressed  Skin:  Skin Assessment: Reviewed RN Assessment  Last BM:  04/08/18  Height:   Ht Readings from Last 1 Encounters:  04/01/18 5' 2.5" (1.588 m)    Weight:   Wt Readings from Last 1 Encounters:  04/11/18 210 lb 5.1 oz (95.4 kg)    Ideal Body Weight:  51.1 kg  BMI:  Body mass index is 37.85  kg/m.  Estimated Nutritional Needs:   Kcal:  1600-1800  Protein:  65-80 grams  Fluid:  > 1.6 L    Orville Mena A. Jimmye Norman, RD, LDN, CDE Pager: [phone] After hours Pager: [phone]

## 2018-04-12 NOTE — Progress Notes (Signed)
Referring Physician(s): CCM/Yacoub  Supervising Physician: Jacqulynn Cadet  Patient Status:  [facility] - In-pt  Chief Complaint: Left empyema  Subjective: Less alert.  Arousable.  Follows commands.  Not currently in distress.   Allergies: Codeine and Penicillins  Medications: Prior to Admission medications   Medication Sig Start Date End Date Taking? Authorizing Provider  albuterol (PROVENTIL HFA;VENTOLIN HFA) 108 (90 BASE) MCG/ACT inhaler Inhale 2 puffs into the lungs every 6 (six) hours as needed for wheezing or shortness of breath.    Yes [provider]  albuterol (PROVENTIL) (2.5 MG/3ML) 0.083% nebulizer solution Take 2.5 mg by nebulization every 6 (six) hours as needed for wheezing.   Yes [provider]  amLODipine (NORVASC) 5 MG tablet Take 5 mg by mouth daily.   Yes [provider]  aspirin EC 81 MG tablet Take 81 mg by mouth daily.   Yes [provider]  atorvastatin (LIPITOR) 20 MG tablet Take 20 mg by mouth daily.   Yes [provider]  carvedilol (COREG) 25 MG tablet Take 25 mg by mouth 2 (two) times daily with a meal.   Yes [provider]  Cholecalciferol (VITAMIN D-3 PO) Take 1 capsule by mouth daily.   Yes [provider]  Cyanocobalamin (VITAMIN B-12 PO) Take 500 mcg by mouth daily.    Yes [provider]  doxazosin (CARDURA) 4 MG tablet Take 4 mg by mouth at bedtime.    Yes [provider]  fluticasone (FLONASE) 50 MCG/ACT nasal spray Place 2 sprays into the nose daily as needed for allergies or rhinitis.    Yes [provider]  FOLIC ACID PO Take 1 tablet by mouth daily.    Yes [provider]  gabapentin (NEURONTIN) 300 MG capsule Take 300 mg by mouth 3 (three) times daily.    Yes [provider]  levothyroxine (SYNTHROID, LEVOTHROID) 50 MCG tablet Take 50 mcg by mouth daily.   Yes [provider]  Liniments (SALONPAS PAIN RELIEF PATCH EX) Apply 1  patch topically daily as needed (pain).   Yes [provider]  losartan-hydrochlorothiazide (HYZAAR) 50-12.5 MG per tablet Take 1 tablet by mouth daily.   Yes [provider]  traMADol (ULTRAM) 50 MG tablet Take 1 tablet (50 mg total) by mouth every 6 (six) hours as needed. Patient taking differently: Take 50 mg by mouth See admin instructions. Take 50 mg by mouth once a day as needed for pain and 50 mg at bedtime scheduled 03/17/18  Yes Daleen Bo, MD  trolamine salicylate (ASPERCREME) 10 % cream Apply 1 application topically as needed (pain).   Yes [provider]  cefdinir (OMNICEF) 300 MG capsule Take 1 capsule (300 mg total) by mouth 2 (two) times daily. Patient not taking: Reported on 04/01/2018 05/17/13   Orpah Greek, MD     Vital Signs: BP (!) 147/72 (BP Location: Right Arm)   Pulse 98   Temp 97.6 F (36.4 C) (Oral)   Resp 15   Ht 5' 2.5" (1.588 m)   Wt 210 lb 5.1 oz (95.4 kg)   SpO2 97%   BMI 37.85 kg/m   Physical Exam  Constitutional: She is oriented to person, place, and time.  Cardiovascular: Normal rate.  Pulmonary/Chest: Effort normal. No respiratory distress.  On 3L [place]. Chest tube in place.  Output in pleurvac has slowed. Congestion.   Neurological: She is alert and oriented to person, place, and time.  Skin: Skin is dry.  Psychiatric:  She has a normal mood and affect. Her behavior is normal.  Nursing note and vitals reviewed.   Imaging: Dg Chest 2 View  Result Date: 04/09/2018 CLINICAL DATA:  Status post left chest tube placement EXAM: CHEST - 2 VIEW COMPARISON:  04/08/2018 FINDINGS: Cardiac shadow remains enlarged but stable. Left chest tube is again identified. No definitive pneumothorax is seen. The left-sided pleural effusion and basilar consolidation is again identified. Small right-sided pleural effusion is seen. No acute bony abnormality is noted. IMPRESSION: Stable appearance of the chest when compared with the  previous exam. Electronically Signed   By: Inez Catalina M.D.   On: 04/09/2018 09:41   Ct Chest Wo Contrast  Result Date: 04/12/2018 CLINICAL DATA:  Post drain evaluation. EXAM: CT CHEST WITHOUT CONTRAST TECHNIQUE: Multidetector CT imaging of the chest was performed following the standard protocol without IV contrast. COMPARISON:  04/06/2018 CT FINDINGS: Cardiovascular: Top-normal size heart with about pericardial effusion or thickening. Left main and three-vessel coronary arteriosclerosis. Moderate aortic atherosclerosis without aneurysm. The unenhanced pulmonary vasculature is unremarkable. Conventional branch pattern of the great vessels. Mediastinum/Nodes: Stable nonpathologic sized mediastinal lymph nodes. No thyromegaly or mass. Midline trachea and patent mainstem bronchi. The esophagus is unremarkable. Lungs/Pleura: There is a percutaneous left-sided posterior pigtail drainage catheter noted adjacent to the proximal descending thoracic aorta with interval decrease in loculated pleural effusion in its vicinity since prior. Residual loculated pleural fluid and thickening is seen along the periphery of the left hemithorax. Some decrease in pleural fluid at the left lung base though there is a small loculated collection medially measuring approximately 4.9 x 4.8 cm, series 3/75. Increase in the volume of left paramediastinal and pericardial loculated fluid measuring 6.2 x 3.7 x 7.8 cm is identified since prior exam, series 3/66 and series 6/62. Minimally larger right pleural effusion with adjacent compressive atelectasis is seen since prior. Upper Abdomen: Colonic diverticulosis is noted along the left colon. No acute abnormality within the upper abdomen. Musculoskeletal: No chest wall mass or suspicious bone lesions identified. IMPRESSION: 1. Left-sided percutaneous pigtail drainage catheter is seen adjacent to the proximal descending aorta with interval decrease in localized fluid adjacent to the catheter  tip. 2. Interval increase in left pericardial and paramediastinal loculated fluid since prior exam measuring 6.2 x 3.7 x 7.8 cm. Relatively stable loculated pleural fluid and thickening along the periphery of the left hemithorax since prior. Some residual loculated pleural fluid along the left lung base medially measuring 4.9 x 4.8 cm. 3. Slight increase in small right pleural effusion associated compressive atelectasis. 4. Top-normal size heart with left main and three-vessel coronary arteriosclerosis. Aortic Atherosclerosis (ICD10-I70.0). Electronically Signed   By: Ashley Royalty M.D.   On: 04/12/2018 03:44   Dg Chest Port 1 View  Result Date: 04/11/2018 CLINICAL DATA:  Respiratory failure, cough. EXAM: PORTABLE CHEST 1 VIEW COMPARISON:  Radiograph of Apr 10, 2018. FINDINGS: Stable cardiomegaly. Left-sided chest tube is unchanged in position. No definite pneumothorax is noted. Stable moderate left pleural effusion is noted with probable underlying atelectasis or infiltrate. Stable mild right pleural effusion is noted with associated atelectasis. Bony thorax is unremarkable. IMPRESSION: Stable position of left-sided chest tube with no definite pneumothorax. Stable moderate left pleural effusion is noted with probable underlying atelectasis or infiltrate. Stable mild right pleural effusion is noted with associated atelectasis. Electronically Signed   By: Marijo Conception, M.D.   On: 04/11/2018 10:31   Dg Chest Port 1 View  Result Date: 04/10/2018 CLINICAL DATA:  Followup left empyema and chest tube. EXAM: PORTABLE CHEST 1 VIEW COMPARISON:  04/09/2018 and older exams. FINDINGS: Opacity of the left appears mildly increased consistent with an increase in pleural fluid. A past at the right lung base is similar to previous day's study consistent with a small pleural effusion. Lung base parenchymal opacity is noted consistent with atelectasis of the right and either atelectasis pneumonia or comminution on the left. Left  upper a door X pigtail chest tube is stable.  No pneumothorax. IMPRESSION: 1. Left pleural effusion has mildly increased from the previous exam. No other change. 2. Persistent left lung base consolidation. 3. No change in the left chest tube and no pneumothorax visualized. Electronically Signed   By: Lajean Manes M.D.   On: 04/10/2018 09:51    Labs:  CBC: Recent Labs    04/04/18 1022 04/07/18 0407 04/09/18 0424 04/12/18 0522  WBC 23.5* 12.5* 12.7* 22.2*  HGB 9.6* 7.9* 8.1* 8.4*  HCT 29.1* 24.8* 25.3* 25.5*  PLT 327 439* 434* 534*    COAGS: Recent Labs    04/01/18 1928  INR 1.36  APTT 39*    BMP: Recent Labs    04/06/18 1141 04/07/18 0407 04/08/18 0435 04/09/18 0424 04/12/18 0522  NA 140 143  --  139 141  K 3.6 3.4*  --  3.6 3.6  CL 111 111  --  107 103  CO2 17* 22  --  26 27  GLUCOSE 135* 142*  --  112* 77  BUN 13 13  --  6 10  CALCIUM 9.1 8.7*  --  8.2* 8.1*  CREATININE 1.01* 0.90 0.82 0.78 1.13*  GFRNONAA 48* 55* >60 >60 42*  GFRAA 56* >60 >60 >60 49*    LIVER FUNCTION TESTS: Recent Labs    04/01/18 1928  BILITOT 1.5*  AST 29  ALT 16  ALKPHOS 106  PROT 6.9  ALBUMIN 2.3*    Assessment and Plan: Left empyema S/p chest tube placement 5/5.   Output has overall slowed.  Repeat CT shows: 1. Left-sided percutaneous pigtail drainage catheter is seen adjacent to the proximal descending aorta with interval decrease in localized fluid adjacent to the catheter tip. 2. Interval increase in left pericardial and paramediastinal loculated fluid since prior exam measuring 6.2 x 3.7 x 7.8 cm. Relatively stable loculated pleural fluid and thickening along the periphery of the left hemithorax since prior. Some residual loculated pleural fluid along the left lung base medially measuring 4.9 x 4.8 cm. 3. Slight increase in small right pleural effusion associated compressive atelectasis. 4. Top-normal size heart with left main and three-vessel  coronary Arteriosclerosis. IR consulted for second chest tube placement.  Discussed with daughter at length at bedside.  Discussed procedure in full including benefits and risks.  Discussed current plan to hold lovenox, make NPO, and proceed tomorrow as long as she remains stable. She would like to discuss with sister prior to providing consent.  Orders placed to hold thinners, NPO after midnight, and for RN to assist in obtaining consent.   Electronically Signed: Docia Barrier, PA 04/12/2018, 4:53 PM   I spent a total of 15 Minutes at the the patient's bedside AND on the patient's hospital floor or unit, greater than 50% of which was counseling/coordinating care for left empyema CT drain

## 2018-04-12 NOTE — Progress Notes (Signed)
Patient ID: Monique Lin, female   DOB: [birth year], 84 y.o.   MRN: [id number] TCTS DAILY ICU PROGRESS NOTE                   [address]            [place] [address]          [phone]       Total Length of Stay:  LOS: 11 days   Subjective: Patient lethargic with labored breathing  Objective: Vital signs in last 24 hours: Temp:  [97.6 F (36.4 C)-99.3 F (37.4 C)] 97.6 F (36.4 C) (05/10 1347) Pulse Rate:  [92-100] 98 (05/10 1347) Cardiac Rhythm: Normal sinus rhythm (05/10 0703) Resp:  [15] 15 (05/10 0432) BP: (118-147)/(48-72) 147/72 (05/10 1347) SpO2:  [97 %-100 %] 97 % (05/10 1347)  Filed Weights   04/08/18 1007 04/09/18 1020 04/11/18 0531  Weight: 201 lb 8 oz (91.4 kg) 205 lb 11 oz (93.3 kg) 210 lb 5.1 oz (95.4 kg)    Weight change:    Hemodynamic parameters for last 24 hours:    Intake/Output from previous day: 05/09 0701 - 05/10 0700 In: 100 [IV Piggyback:100] Out: 1901 [Urine:1900; Stool:1]  Intake/Output this shift: Total I/O In: 0  Out: 41 [Stool:1; Chest Tube:40]  Current Meds: Scheduled Meds: . aspirin  81 mg Oral Daily  . docusate sodium  100 mg Oral Daily  . enoxaparin (LOVENOX) injection  40 mg Subcutaneous Q24H  . feeding supplement (ENSURE ENLIVE)  237 mL Oral BID BM  . feeding supplement (PRO-STAT SUGAR FREE 64)  30 mL Oral TID BM  . guaiFENesin  1,200 mg Oral BID  . levalbuterol  1.25 mg Nebulization TID  . levothyroxine  50 mcg Oral QAC breakfast  . mouth rinse  15 mL Mouth Rinse BID  . metoprolol tartrate  12.5 mg Oral BID  . multivitamin with minerals  1 tablet Oral Daily   Continuous Infusions: . sodium chloride 10 mL/hr at 04/11/18 1509  . metronidazole Stopped (04/12/18 1231)   PRN Meds:.sodium chloride, acetaminophen, albuterol, oxyCODONE, polyethylene glycol  General appearance: moderate distress and slowed mentation Neurologic: intact Heart: regular rate and rhythm, S1, S2 normal, no murmur, click, rub  or gallop Lungs: diminished breath sounds bilaterally Abdomen: soft, non-tender; bowel sounds normal; no masses,  no organomegaly Extremities: extremities normal, atraumatic, no cyanosis or edema and Homans sign is negative, no sign of DVT  Lab Results: CBC: Recent Labs    04/12/18 0522  WBC 22.2*  HGB 8.4*  HCT 25.5*  PLT 534*   BMET:  Recent Labs    04/12/18 0522  NA 141  K 3.6  CL 103  CO2 27  GLUCOSE 77  BUN 10  CREATININE 1.13*  CALCIUM 8.1*    CMET: Lab Results  Component Value Date   WBC 22.2 (H) 04/12/2018   HGB 8.4 (L) 04/12/2018   HCT 25.5 (L) 04/12/2018   PLT 534 (H) 04/12/2018   GLUCOSE 77 04/12/2018   ALT 16 04/01/2018   AST 29 04/01/2018   NA 141 04/12/2018   K 3.6 04/12/2018   CL 103 04/12/2018   CREATININE 1.13 (H) 04/12/2018   BUN 10 04/12/2018   CO2 27 04/12/2018   TSH 0.977 04/01/2018   INR 1.36 04/01/2018      PT/INR: No results for input(s): LABPROT, INR in the last 72 hours. Radiology: Ct Chest Wo Contrast  Result Date: 04/12/2018 CLINICAL DATA:  Post  drain evaluation. EXAM: CT CHEST WITHOUT CONTRAST TECHNIQUE: Multidetector CT imaging of the chest was performed following the standard protocol without IV contrast. COMPARISON:  04/06/2018 CT FINDINGS: Cardiovascular: Top-normal size heart with about pericardial effusion or thickening. Left main and three-vessel coronary arteriosclerosis. Moderate aortic atherosclerosis without aneurysm. The unenhanced pulmonary vasculature is unremarkable. Conventional branch pattern of the great vessels. Mediastinum/Nodes: Stable nonpathologic sized mediastinal lymph nodes. No thyromegaly or mass. Midline trachea and patent mainstem bronchi. The esophagus is unremarkable. Lungs/Pleura: There is a percutaneous left-sided posterior pigtail drainage catheter noted adjacent to the proximal descending thoracic aorta with interval decrease in loculated pleural effusion in its vicinity since prior. Residual  loculated pleural fluid and thickening is seen along the periphery of the left hemithorax. Some decrease in pleural fluid at the left lung base though there is a small loculated collection medially measuring approximately 4.9 x 4.8 cm, series 3/75. Increase in the volume of left paramediastinal and pericardial loculated fluid measuring 6.2 x 3.7 x 7.8 cm is identified since prior exam, series 3/66 and series 6/62. Minimally larger right pleural effusion with adjacent compressive atelectasis is seen since prior. Upper Abdomen: Colonic diverticulosis is noted along the left colon. No acute abnormality within the upper abdomen. Musculoskeletal: No chest wall mass or suspicious bone lesions identified. IMPRESSION: 1. Left-sided percutaneous pigtail drainage catheter is seen adjacent to the proximal descending aorta with interval decrease in localized fluid adjacent to the catheter tip. 2. Interval increase in left pericardial and paramediastinal loculated fluid since prior exam measuring 6.2 x 3.7 x 7.8 cm. Relatively stable loculated pleural fluid and thickening along the periphery of the left hemithorax since prior. Some residual loculated pleural fluid along the left lung base medially measuring 4.9 x 4.8 cm. 3. Slight increase in small right pleural effusion associated compressive atelectasis. 4. Top-normal size heart with left main and three-vessel coronary arteriosclerosis. Aortic Atherosclerosis (ICD10-I70.0). Electronically Signed   By: Ashley Royalty M.D.   On: 04/12/2018 03:44     Assessment/Plan: Patient has seen some improvement in the upper part of the left chest with pigtail catheter placement however the drainage from this catheter is decreased there is residual loculated fluid at the left base.  The patient is not an operative candidate for formal decortication.  Discussed with pulmonary, Dr. Halford Chessman will attempt left lower chest pigtail catheter drainage as a palliative maneuver.   Discussion with the  family as far as possible CODE STATUS and vent support if it becomes necessary will be left to the primary service and the pulmonary service.        [name] 04/12/2018 3:06 PM

## 2018-04-12 NOTE — Progress Notes (Signed)
Subjective:  Patient complains of cough and left-sided pleuritic chest pain. CT results noted. WBCs gradually trending up  Objective:  Vital Signs in the last 24 hours: Temp:  [98.5 F (36.9 C)-99.3 F (37.4 C)] 98.5 F (36.9 C) (05/10 0432) Pulse Rate:  [92-100] 100 (05/10 1130) Resp:  [15] 15 (05/10 0432) BP: (118-136)/(48-65) 136/65 (05/10 1130) SpO2:  [97 %-100 %] 100 % (05/10 1130)  Intake/Output from previous day: 05/09 0701 - 05/10 0700 In: 100 [IV Piggyback:100] Out: 1901 [Urine:1900; Stool:1] Intake/Output from this shift: Total I/O In: 0  Out: 41 [Stool:1; Chest Tube:40]  Physical Exam: Neck: no adenopathy, no carotid bruit, no JVD and supple, symmetrical, trachea midline Lungs: Decreased breath sound at bases left more than right with rhonchi noted Heart: regular rate and rhythm, S1, S2 normal and 2/6 systolic murmur noted Abdomen: soft, non-tender; bowel sounds normal; no masses,  no organomegaly Extremities: extremities normal, atraumatic, no cyanosis or edema  Lab Results: Recent Labs    04/12/18 0522  WBC 22.2*  HGB 8.4*  PLT 534*   Recent Labs    04/12/18 0522  NA 141  K 3.6  CL 103  CO2 27  GLUCOSE 77  BUN 10  CREATININE 1.13*   No results for input(s): TROPONINI in the last 72 hours.  Invalid input(s): CK, MB Hepatic Function Panel No results for input(s): PROT, ALBUMIN, AST, ALT, ALKPHOS, BILITOT, BILIDIR, IBILI in the last 72 hours. No results for input(s): CHOL in the last 72 hours. No results for input(s): PROTIME in the last 72 hours.  Imaging: Imaging results have been reviewed and Ct Chest Wo Contrast  Result Date: 04/12/2018 CLINICAL DATA:  Post drain evaluation. EXAM: CT CHEST WITHOUT CONTRAST TECHNIQUE: Multidetector CT imaging of the chest was performed following the standard protocol without IV contrast. COMPARISON:  04/06/2018 CT FINDINGS: Cardiovascular: Top-normal size heart with about pericardial effusion or thickening.  Left main and three-vessel coronary arteriosclerosis. Moderate aortic atherosclerosis without aneurysm. The unenhanced pulmonary vasculature is unremarkable. Conventional branch pattern of the great vessels. Mediastinum/Nodes: Stable nonpathologic sized mediastinal lymph nodes. No thyromegaly or mass. Midline trachea and patent mainstem bronchi. The esophagus is unremarkable. Lungs/Pleura: There is a percutaneous left-sided posterior pigtail drainage catheter noted adjacent to the proximal descending thoracic aorta with interval decrease in loculated pleural effusion in its vicinity since prior. Residual loculated pleural fluid and thickening is seen along the periphery of the left hemithorax. Some decrease in pleural fluid at the left lung base though there is a small loculated collection medially measuring approximately 4.9 x 4.8 cm, series 3/75. Increase in the volume of left paramediastinal and pericardial loculated fluid measuring 6.2 x 3.7 x 7.8 cm is identified since prior exam, series 3/66 and series 6/62. Minimally larger right pleural effusion with adjacent compressive atelectasis is seen since prior. Upper Abdomen: Colonic diverticulosis is noted along the left colon. No acute abnormality within the upper abdomen. Musculoskeletal: No chest wall mass or suspicious bone lesions identified. IMPRESSION: 1. Left-sided percutaneous pigtail drainage catheter is seen adjacent to the proximal descending aorta with interval decrease in localized fluid adjacent to the catheter tip. 2. Interval increase in left pericardial and paramediastinal loculated fluid since prior exam measuring 6.2 x 3.7 x 7.8 cm. Relatively stable loculated pleural fluid and thickening along the periphery of the left hemithorax since prior. Some residual loculated pleural fluid along the left lung base medially measuring 4.9 x 4.8 cm. 3. Slight increase in small right pleural effusion associated compressive  atelectasis. 4. Top-normal size  heart with left main and three-vessel coronary arteriosclerosis. Aortic Atherosclerosis (ICD10-I70.0). Electronically Signed   By: Ashley Royalty M.D.   On: 04/12/2018 03:44   Dg Chest Port 1 View  Result Date: 04/11/2018 CLINICAL DATA:  Respiratory failure, cough. EXAM: PORTABLE CHEST 1 VIEW COMPARISON:  Radiograph of Apr 10, 2018. FINDINGS: Stable cardiomegaly. Left-sided chest tube is unchanged in position. No definite pneumothorax is noted. Stable moderate left pleural effusion is noted with probable underlying atelectasis or infiltrate. Stable mild right pleural effusion is noted with associated atelectasis. Bony thorax is unremarkable. IMPRESSION: Stable position of left-sided chest tube with no definite pneumothorax. Stable moderate left pleural effusion is noted with probable underlying atelectasis or infiltrate. Stable mild right pleural effusion is noted with associated atelectasis. Electronically Signed   By: Marijo Conception, M.D.   On: 04/11/2018 10:31    Cardiac Studies:  Assessment/Plan:  ResolvingLeft lung pneumoniacomplicated by empyemaStatus post bronchoscopy noted to have bronchomalacia. Status post CT-guided drain left pleural space Possible pleuropericarditis Marked leukocytosis Status post septic shock. Status postacute renal injury History of hypertension Hyperlipidemia Hypothyroidism Acute on chronic anemia probably secondary to hydration rule out GI loss History of bronchial asthma History of GI stromal tumor in the past History of GI bleed in the past Plan We'll get 2-D echo and EKG Antibiotics per CCM Awaiting further recommendation from interventional radiology and CVTS   LOS: 11 days    [name] 04/12/2018, 1:01 PM

## 2018-04-13 ENCOUNTER — Inpatient Hospital Stay (HOSPITAL_COMMUNITY): Payer: Medicare Other

## 2018-04-13 MED ORDER — MIDAZOLAM HCL 2 MG/2ML IJ SOLN
INTRAMUSCULAR | Status: AC | PRN
  Administered 2018-04-13: 1 mg via INTRAVENOUS

## 2018-04-13 MED ORDER — PIPERACILLIN-TAZOBACTAM 3.375 G IVPB
3.3750 g | Freq: Three times a day (TID) | INTRAVENOUS | Status: DC
  Administered 2018-04-13 – 2018-04-18 (×15): 3.375 g via INTRAVENOUS
  Filled 2018-04-13 (×17): qty 50

## 2018-04-13 MED ORDER — MIDAZOLAM HCL 2 MG/2ML IJ SOLN
INTRAMUSCULAR | Status: AC
  Filled 2018-04-13: qty 4

## 2018-04-13 MED ORDER — SODIUM CHLORIDE 0.9% FLUSH
5.0000 mL | Freq: Three times a day (TID) | INTRAVENOUS | Status: DC
  Administered 2018-04-13 – 2018-04-18 (×4): 5 mL
  Administered 2018-04-18: 10 mL
  Administered 2018-04-19 – 2018-04-24 (×7): 5 mL

## 2018-04-13 MED ORDER — FENTANYL CITRATE (PF) 100 MCG/2ML IJ SOLN
INTRAMUSCULAR | Status: AC
  Filled 2018-04-13: qty 4

## 2018-04-13 MED ORDER — FENTANYL CITRATE (PF) 100 MCG/2ML IJ SOLN
INTRAMUSCULAR | Status: AC | PRN
  Administered 2018-04-13: 50 ug via INTRAVENOUS

## 2018-04-13 MED ORDER — VANCOMYCIN HCL 10 G IV SOLR
1250.0000 mg | INTRAVENOUS | Status: DC
  Administered 2018-04-14 – 2018-04-18 (×5): 1250 mg via INTRAVENOUS
  Filled 2018-04-13 (×6): qty 1250

## 2018-04-13 MED ORDER — SODIUM CHLORIDE 0.9 % IV SOLN
1750.0000 mg | Freq: Once | INTRAVENOUS | Status: AC
  Administered 2018-04-13: 1750 mg via INTRAVENOUS
  Filled 2018-04-13: qty 1750

## 2018-04-13 MED ORDER — ENOXAPARIN SODIUM 40 MG/0.4ML ~~LOC~~ SOLN
40.0000 mg | SUBCUTANEOUS | Status: DC
  Administered 2018-04-13 – 2018-04-24 (×12): 40 mg via SUBCUTANEOUS
  Filled 2018-04-13 (×13): qty 0.4

## 2018-04-13 MED ORDER — LIDOCAINE HCL 1 % IJ SOLN
INTRAMUSCULAR | Status: AC
  Filled 2018-04-13: qty 20

## 2018-04-13 NOTE — Progress Notes (Signed)
Pharmacy Antibiotic Note  Monique Lin is a 84 y.o. female admitted on 04/01/2018 with pneumonia.  Pharmacy has been consulted for Zosyn and Vancomycin dosing. Patient with noted allergy to PCN listed as stomach cramps- benefits outweigh risks.   WBC up at 22.2. Afebrile. SCr up at 1.13 with estimated CrCl~ 37 mL/min.  L-pleural fluid culture with anaerobes.  New blood culture sent 5/11.   Plan: Zosyn 3.375g IV every 8 hours- 4 hr infusion.  Vancomycin 1750 mg IV x1 then 1250 mg IV every 24 hours.  Monitor renal function, culture results, and clinical status.  Follow-up ability to narrow.   Height: 5' 2.5" (158.8 cm) Weight: 210 lb 5.1 oz (95.4 kg) IBW/kg (Calculated) : 51.25  Temp (24hrs), Avg:98.8 F (37.1 C), Min:97.6 F (36.4 C), Max:99.4 F (37.4 C)  Recent Labs  Lab 04/06/18 1141 04/07/18 0407 04/08/18 0435 04/09/18 0424 04/12/18 0522  WBC  --  12.5*  --  12.7* 22.2*  CREATININE 1.01* 0.90 0.82 0.78 1.13*    Estimated Creatinine Clearance: 37.4 mL/min (A) (by C-G formula based on SCr of 1.13 mg/dL (H)).    Allergies  Allergen Reactions  . Codeine Other (See Comments)    Stomach cramps  . Penicillins Other (See Comments)    "Bad stomach cramps" Has patient had a PCN reaction causing immediate rash, facial/tongue/throat swelling, SOB or lightheadedness with hypotension: Unk Has patient had a PCN reaction causing severe rash involving mucus membranes or skin necrosis: Unk Has patient had a PCN reaction that required hospitalization: Unk Has patient had a PCN reaction occurring within the last 10 years: No If all of the above answers are "NO", then may proceed with Cephalosporin use.      Antimicrobials this admission: Azith 4/29 >> 5/5 CTX 4/29 >> 5/9 Flagyl 5/7 >> 5/11 Vancomycin 5/11 >> Zosyn 5/11 >>  Dose adjustments this admission:   Microbiology results: 4/29 flu neg 4/29 strep pna neg 4/29 urine neg  4/29 blood x 2 neg 4/29 blood x 2 neg 4/29  resp pcr neg 4/29 pertussis pcr neg 4/30 MRSA pcr neg 5/3 left pleural fluid - Parvimonas micra (gp anaerobe) + Prevotella spp (gn anaerobe, beta lactamase +)  Thank you for allowing pharmacy to be a part of this patient's care.  Sloan Leiter, PharmD, BCPS, BCCCP Clinical Pharmacist Clinical phone 04/13/2018 until 3:30PM [phone] After hours, please call #[phone] 04/13/2018 10:19 AM

## 2018-04-13 NOTE — Progress Notes (Signed)
Daughter signed consent form and witnessed by myself and TG.

## 2018-04-13 NOTE — Procedures (Signed)
Interventional Radiology Procedure Note  Procedure: CT guided pleural drain, into loculated component at the left lung base  Complications: None Recommendations:  - To waterseal chamber, on suction - Do not submerge   - Routine wound care   Signed,  Dulcy Fanny. Earleen Newport, DO

## 2018-04-13 NOTE — Sedation Documentation (Signed)
Patient is resting comfortably. 

## 2018-04-13 NOTE — Progress Notes (Signed)
Subjective:  Patient complains of cough and left-sided pleuritic back pain. Patient not felt to be the surgical candidate as scheduled for nuclear drain placement today. White count markedly elevated .  Objective:  Vital Signs in the last 24 hours: Temp:  [97.6 F (36.4 C)-99.4 F (37.4 C)] 99.3 F (37.4 C) (05/11 0612) Pulse Rate:  [97-101] 97 (05/11 0920) Resp:  [18] 18 (05/11 0920) BP: (136-147)/(63-72) 139/63 (05/11 0612) SpO2:  [96 %-100 %] 96 % (05/11 0920) FiO2 (%):  [32 %] 32 % (05/11 0920)  Intake/Output from previous day: 05/10 0701 - 05/11 0700 In: 0  Out: 191 [Urine:150; Stool:1; Chest Tube:40] Intake/Output from this shift: No intake/output data recorded.  Physical Exam: Neck: no adenopathy, no carotid bruit, no JVD and supple, symmetrical, trachea midline Lungs: Markedly decreased breath sounds left lung field with rhonchi and decreased breath sound at right base Heart: regular rate and rhythm, S1, S2 normal and 2/6 systolic murmur noted Abdomen: soft, non-tender; bowel sounds normal; no masses,  no organomegaly Extremities: extremities normal, atraumatic, no cyanosis or edema  Lab Results: Recent Labs    04/12/18 0522  WBC 22.2*  HGB 8.4*  PLT 534*   Recent Labs    04/12/18 0522  NA 141  K 3.6  CL 103  CO2 27  GLUCOSE 77  BUN 10  CREATININE 1.13*   No results for input(s): TROPONINI in the last 72 hours.  Invalid input(s): CK, MB Hepatic Function Panel No results for input(s): PROT, ALBUMIN, AST, ALT, ALKPHOS, BILITOT, BILIDIR, IBILI in the last 72 hours. No results for input(s): CHOL in the last 72 hours. No results for input(s): PROTIME in the last 72 hours.  Imaging: Imaging results have been reviewed and Ct Chest Wo Contrast  Result Date: 04/12/2018 CLINICAL DATA:  Post drain evaluation. EXAM: CT CHEST WITHOUT CONTRAST TECHNIQUE: Multidetector CT imaging of the chest was performed following the standard protocol without IV contrast.  COMPARISON:  04/06/2018 CT FINDINGS: Cardiovascular: Top-normal size heart with about pericardial effusion or thickening. Left main and three-vessel coronary arteriosclerosis. Moderate aortic atherosclerosis without aneurysm. The unenhanced pulmonary vasculature is unremarkable. Conventional branch pattern of the great vessels. Mediastinum/Nodes: Stable nonpathologic sized mediastinal lymph nodes. No thyromegaly or mass. Midline trachea and patent mainstem bronchi. The esophagus is unremarkable. Lungs/Pleura: There is a percutaneous left-sided posterior pigtail drainage catheter noted adjacent to the proximal descending thoracic aorta with interval decrease in loculated pleural effusion in its vicinity since prior. Residual loculated pleural fluid and thickening is seen along the periphery of the left hemithorax. Some decrease in pleural fluid at the left lung base though there is a small loculated collection medially measuring approximately 4.9 x 4.8 cm, series 3/75. Increase in the volume of left paramediastinal and pericardial loculated fluid measuring 6.2 x 3.7 x 7.8 cm is identified since prior exam, series 3/66 and series 6/62. Minimally larger right pleural effusion with adjacent compressive atelectasis is seen since prior. Upper Abdomen: Colonic diverticulosis is noted along the left colon. No acute abnormality within the upper abdomen. Musculoskeletal: No chest wall mass or suspicious bone lesions identified. IMPRESSION: 1. Left-sided percutaneous pigtail drainage catheter is seen adjacent to the proximal descending aorta with interval decrease in localized fluid adjacent to the catheter tip. 2. Interval increase in left pericardial and paramediastinal loculated fluid since prior exam measuring 6.2 x 3.7 x 7.8 cm. Relatively stable loculated pleural fluid and thickening along the periphery of the left hemithorax since prior. Some residual loculated pleural fluid  along the left lung base medially measuring  4.9 x 4.8 cm. 3. Slight increase in small right pleural effusion associated compressive atelectasis. 4. Top-normal size heart with left main and three-vessel coronary arteriosclerosis. Aortic Atherosclerosis (ICD10-I70.0). Electronically Signed   By: Ashley Royalty M.D.   On: 04/12/2018 03:44    Cardiac Studies:  Assessment/Plan:  Left lung pneumoniacomplicated by empyemaStatus post bronchoscopy noted to have bronchomalacia. Status post CT-guided drain left pleural space Possible pleuropericarditis Marked leukocytosis Status post septic shock. Status postacute renal injury History of hypertension Hyperlipidemia Hypothyroidism Acute on chronic anemia probably secondary to hydration rule out GI loss History of bronchial asthma History of GI stromal tumor in the past History of GI bleed in the past Plan Schedule for pigtail catheter placement by interventional radiology today Will send blood cultures 2 again Will start empirically on Vanco and Zosyn pending cultures Will discuss with CCM    LOS: 12 days    [name] 04/13/2018, 9:53 AM

## 2018-04-13 NOTE — Progress Notes (Addendum)
      [address]       [place] [address]             [phone]           Subjective: Patient's family asking when IR procedure will be done.  Objective: Vital signs in last 24 hours: Temp:  [97.6 F (36.4 C)-99.4 F (37.4 C)] 99.3 F (37.4 C) (05/11 0612) Pulse Rate:  [97-101] 97 (05/11 0920) Cardiac Rhythm: Sinus tachycardia (05/11 0704) Resp:  [18] 18 (05/11 0920) BP: (136-147)/(63-72) 139/63 (05/11 0612) SpO2:  [96 %-100 %] 96 % (05/11 0920) FiO2 (%):  [32 %] 32 % (05/11 0920)     Intake/Output from previous day: 05/10 0701 - 05/11 0700 In: 0  Out: 191 [Urine:150; Stool:1; Chest Tube:40]   Physical Exam:  Cardiovascular: RRR Pulmonary: Diminished at bases bilaterally Left pigtail chest tube: with light yellow pleural fluid drainage  Lab Results: CBC: Recent Labs    04/12/18 0522  WBC 22.2*  HGB 8.4*  HCT 25.5*  PLT 534*   BMET:  Recent Labs    04/12/18 0522  NA 141  K 3.6  CL 103  CO2 27  GLUCOSE 77  BUN 10  CREATININE 1.13*  CALCIUM 8.1*    PT/INR: No results for input(s): LABPROT, INR in the last 72 hours. ABG:  INR: Will add last result for INR, ABG once components are confirmed Will add last 4 CBG results once components are confirmed  Assessment/Plan:  1. CV - SR in the 90's. 2.  Pulmonary - Left pigtail tube with 40 of output last 24 hours. Per Dr. Servando Snare, the patient is not an operative candidate for formal decortication. Patient awaiting to have pigtail catheter place for improvement in the left lower base. 3. Leukocytosis-WBC increased to 22,200 this am    Donielle M ZimmermanPA-C 04/13/2018,10:23 AM   patient examined and medical record reviewed,agree with above note. Tharon Aquas Trigt III 04/13/2018

## 2018-04-13 NOTE — Sedation Documentation (Signed)
Vital signs stable. 

## 2018-04-14 ENCOUNTER — Inpatient Hospital Stay (HOSPITAL_COMMUNITY): Payer: Medicare Other

## 2018-04-14 ENCOUNTER — Encounter (HOSPITAL_COMMUNITY): Payer: Self-pay | Admitting: Interventional Radiology

## 2018-04-14 HISTORY — PX: IR CATHETER TUBE CHANGE: IMG717

## 2018-04-14 LAB — CBC
HEMATOCRIT: 25.1 % — AB (ref 36.0–46.0)
HEMOGLOBIN: 8 g/dL — AB (ref 12.0–15.0)
MCH: 28.4 pg (ref 26.0–34.0)
MCHC: 31.9 g/dL (ref 30.0–36.0)
MCV: 89 fL (ref 78.0–100.0)
Platelets: 509 10*3/uL — ABNORMAL HIGH (ref 150–400)
RBC: 2.82 MIL/uL — AB (ref 3.87–5.11)
RDW: 14.7 % (ref 11.5–15.5)
WBC: 17.1 10*3/uL — AB (ref 4.0–10.5)

## 2018-04-14 LAB — BASIC METABOLIC PANEL
ANION GAP: 8 (ref 5–15)
BUN: 11 mg/dL (ref 6–20)
CHLORIDE: 104 mmol/L (ref 101–111)
CO2: 28 mmol/L (ref 22–32)
Calcium: 8 mg/dL — ABNORMAL LOW (ref 8.9–10.3)
Creatinine, Ser: 0.91 mg/dL (ref 0.44–1.00)
GFR calc Af Amer: >60 mL/min (ref >60)
GFR calc non Af Amer: 55 mL/min — ABNORMAL LOW (ref >60)
Glucose, Bld: 103 mg/dL — ABNORMAL HIGH (ref 65–99)
POTASSIUM: 3.6 mmol/L (ref 3.5–5.1)
SODIUM: 140 mmol/L (ref 135–145)

## 2018-04-14 MED ORDER — LIDOCAINE HCL 1 % IJ SOLN
INTRAMUSCULAR | Status: AC | PRN
  Administered 2018-04-14: 5 mL

## 2018-04-14 MED ORDER — FENTANYL CITRATE (PF) 100 MCG/2ML IJ SOLN
INTRAMUSCULAR | Status: AC
  Filled 2018-04-14: qty 2

## 2018-04-14 MED ORDER — LIDOCAINE HCL 1 % IJ SOLN
INTRAMUSCULAR | Status: AC
  Filled 2018-04-14: qty 20

## 2018-04-14 MED ORDER — MIDAZOLAM HCL 2 MG/2ML IJ SOLN
INTRAMUSCULAR | Status: AC
  Filled 2018-04-14: qty 2

## 2018-04-14 MED ORDER — FENTANYL CITRATE (PF) 100 MCG/2ML IJ SOLN
INTRAMUSCULAR | Status: AC | PRN
  Administered 2018-04-14: 25 ug via INTRAVENOUS

## 2018-04-14 NOTE — Progress Notes (Signed)
Subjective:  Denies any chest pain complaints of back pain. Denies shortness of breath. States appetite has improved  Objective:  Vital Signs in the last 24 hours: Temp:  [97.9 F (36.6 C)-98.8 F (37.1 C)] 98.8 F (37.1 C) (05/12 0454) Pulse Rate:  [89-110] 89 (05/12 0454) Resp:  [20-22] 20 (05/11 1421) BP: (121-159)/(54-109) 137/63 (05/12 0454) SpO2:  [94 %-100 %] 99 % (05/12 0454) FiO2 (%):  [32 %] 32 % (05/11 1421)  Intake/Output from previous day: 05/11 0701 - 05/12 0700 In: 655.3 [P.O.:100; I.V.:505.3; IV Piggyback:50] Out: 544 [Urine:401; Stool:3; Chest Tube:140] Intake/Output from this shift: Total I/O In: -  Out: 2 [Urine:1; Stool:1]  Physical Exam: Neck: no adenopathy, no carotid bruit, no JVD and supple, symmetrical, trachea midline Lungs: Decreased breath sound bilaterally left more than right with rhonchi noted Heart: regular rate and rhythm, S1, S2 normal and Soft systolic murmur noted Abdomen: soft, non-tender; bowel sounds normal; no masses,  no organomegaly Extremities: extremities normal, atraumatic, no cyanosis or edema  Lab Results: Recent Labs    04/12/18 0522 04/14/18 0417  WBC 22.2* 17.1*  HGB 8.4* 8.0*  PLT 534* 509*   Recent Labs    04/12/18 0522 04/14/18 0417  NA 141 140  K 3.6 3.6  CL 103 104  CO2 27 28  GLUCOSE 77 103*  BUN 10 11  CREATININE 1.13* 0.91   No results for input(s): TROPONINI in the last 72 hours.  Invalid input(s): CK, MB Hepatic Function Panel No results for input(s): PROT, ALBUMIN, AST, ALT, ALKPHOS, BILITOT, BILIDIR, IBILI in the last 72 hours. No results for input(s): CHOL in the last 72 hours. No results for input(s): PROTIME in the last 72 hours.  Imaging: Imaging results have been reviewed and Dg Chest Port 1 View  Result Date: 04/14/2018 CLINICAL DATA:  Pneumonia EXAM: PORTABLE CHEST 1 VIEW COMPARISON:  Apr 11, 2018 FINDINGS: Mediastinal contour is normal. The heart size is enlarged. Left chest tube in the  upper chest is identified unchanged. A second drainage tube is projected over the left lung base. No definite pleural line is noted to suggest pneumothorax. Consolidation of the left mid and lung base as well as right lung base are identified unchanged. There are probable bilateral pleural effusions. The visualized skeletal structures are stable. IMPRESSION: Left chest tube in the upper chest is unchanged. A second drainage tube is projected over the left lung base. There is no evidence of left pneumothorax. Bilateral pleural effusions, left greater than right unchanged. Consolidation both lung bases left greater than right, unchanged. Electronically Signed   By: Abelardo Diesel M.D.   On: 04/14/2018 07:41   Ct Image Guided Drainage By Percutaneous Catheter  Result Date: 04/13/2018 INDICATION: 84 year old female with a history of empyema. Prior chest tube placement, with persisting loculated empyema. EXAM: CT-GUIDED CHEST DRAIN PLACEMENT MEDICATIONS: The patient is currently admitted to the hospital and receiving intravenous antibiotics. The antibiotics were administered within an appropriate time frame prior to the initiation of the procedure. ANESTHESIA/SEDATION: 1.0 mg IV Versed 50 mcg IV Fentanyl Moderate Sedation Time:  16 minutes The patient was continuously monitored during the procedure by the interventional radiology nurse under my direct supervision. COMPLICATIONS: None TECHNIQUE: Informed written consent was obtained from the patient after a thorough discussion of the procedural risks, benefits and alternatives. All questions were addressed. Maximal Sterile Barrier Technique was utilized including caps, mask, sterile gowns, sterile gloves, sterile drape, hand hygiene and skin antiseptic. A timeout was performed prior  to the initiation of the procedure. Patient positioned left decubitus on the CT gantry table. PROCEDURE: The operative field was prepped with chlorhexidine in a sterile fashion, and a  sterile drape was applied covering the operative field. A sterile gown and sterile gloves were used for the procedure. Local anesthesia was provided with 1% Lidocaine. After the patient is prepped and draped in the usual sterile fashion, 1% lidocaine was used for local anesthesia. Using CT guidance, trocar needle was advanced into the loculated fluid at the posterior left lung base. Modified Seldinger technique was then used to place a 12 [place] drain. Drain was sutured in position and attached to water seal chamber. Final CT was performed confirming location. Patient tolerated the procedure well and remained hemodynamically stable throughout. No complications were encountered and no significant blood loss. FINDINGS: CT demonstrates loculated fluid at the left lung base. Twelve French drain was placed into the loculated fluid posterior left lower pleural space. IMPRESSION: Status post CT-guided drain placement into loculated empyema of the left lung base. Signed, Dulcy Fanny. Earleen Newport, DO [facility] Radiology Electronically Signed   By: Corrie Mckusick D.O.   On: 04/13/2018 15:13    Cardiac Studies:  Assessment/Plan:  Left lung pneumoniacomplicated by empyemaStatus post bronchoscopynoted to have bronchomalacia. Status post CT-guided drain left pleural space drainage 2 Possible pleuropericarditis Marked leukocytosis Status post septic shock. 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

## 2018-04-14 NOTE — Progress Notes (Signed)
Noted right buttock stage 2 pressure injury, 2 areas: 1.0x0.5 cm and 0.2x0.2 cm, reapplied foam dressing and will continue to monitor.

## 2018-04-14 NOTE — Progress Notes (Signed)
[facility] PCCM Progress Note    Brief Summary: 84 yo with Left lung PNA with empyema  S/p FOB w/ Bronchomalacia  S/p Pleural drain x 2   S:  Pt underwent 2nd pigtail drain 5/11 , 140cc out since placement , this am drainage tube hub got caught when up to BR . Needs to be changed out. Going to IR   O: Vitals:   04/13/18 1421 04/13/18 2132 04/13/18 2231 04/14/18 0454  BP:   (!) 121/54 137/63  Pulse: 94  98 89  Resp: 20     Temp:   97.9 F (36.6 C) 98.8 F (37.1 C)  TempSrc:   Oral Oral  SpO2: 98% 98% 98% 99%  Weight:      Height:        General:  CBC Latest Ref Rng & Units 04/14/2018 04/12/2018 04/09/2018  WBC 4.0 - 10.5 K/uL 17.1(H) 22.2(H) 12.7(H)  Hemoglobin 12.0 - 15.0 g/dL 8.0(L) 8.4(L) 8.1(L)  Hematocrit 36.0 - 46.0 % 25.1(L) 25.5(L) 25.3(L)  Platelets 150 - 400 K/uL 509(H) 534(H) 434(H)    BMP Latest Ref Rng & Units 04/14/2018 04/12/2018 04/09/2018  Glucose 65 - 99 mg/dL 103(H) 77 112(H)  BUN 6 - 20 mg/dL 11 10 6   Creatinine 0.44 - 1.00 mg/dL 0.91 1.13(H) 0.78  Sodium 135 - 145 mmol/L 140 141 139  Potassium 3.5 - 5.1 mmol/L 3.6 3.6 3.6  Chloride 101 - 111 mmol/L 104 103 107  CO2 22 - 32 mmol/L 28 27 26   Calcium 8.9 - 10.3 mg/dL 8.0(L) 8.1(L) 8.2(L)     Antibiotics Rocephin 4/29 >>  Azithro 4/29 >> 4/30 Flagyl 5/7 > 5/11 Zosyn 5/11 >> Vanc 5/11 >>   Cultures L Pleural fluid 5/3 >> rare WBC, moderate GNR > Parvimonas Micra, prevotella, B lactam POS BCx2 4/29 >> negative  BCx2 4/29 >>neg  BC x 2 5/11 >>    Assessment/Plan  LLL PNA  L empyema with parvimonas micra in culture, B lactam POS S/p tPA/Pulmozyme  P: Continue chest tubes  Mobilize - OOB / PT efforts  cont abx  Follow cx data  Continue mucinex BID  Follow intermittent CXR     Global - rest per primary MD.    Rexene Edison NP-C   Pulmonary and Critical Care  [phone]

## 2018-04-14 NOTE — Progress Notes (Addendum)
      [address]       [place] [address]             [phone]           Subjective: Patient states breathing might be a little better.  Objective: Vital signs in last 24 hours: Temp:  [97.9 F (36.6 C)-98.8 F (37.1 C)] 98.8 F (37.1 C) (05/12 0454) Pulse Rate:  [89-110] 89 (05/12 0454) Cardiac Rhythm: Normal sinus rhythm (05/11 1900) Resp:  [18-22] 20 (05/11 1421) BP: (121-159)/(54-109) 137/63 (05/12 0454) SpO2:  [94 %-100 %] 99 % (05/12 0454) FiO2 (%):  [32 %] 32 % (05/11 1421)     Intake/Output from previous day: 05/11 0701 - 05/12 0700 In: 655.3 [P.O.:100; I.V.:505.3; IV Piggyback:50] Out: 122 [Urine:401; Stool:3; Chest Tube:140]   Physical Exam:  Cardiovascular: RRR Pulmonary: Diminished at bases bilaterally Pigtail chest tubes: with light yellow pleural fluid drainage (from new left base catheter)  Lab Results: CBC: Recent Labs    04/12/18 0522 04/14/18 0417  WBC 22.2* 17.1*  HGB 8.4* 8.0*  HCT 25.5* 25.1*  PLT 534* 509*   BMET:  Recent Labs    04/12/18 0522 04/14/18 0417  NA 141 140  K 3.6 3.6  CL 103 104  CO2 27 28  GLUCOSE 77 103*  BUN 10 11  CREATININE 1.13* 0.91  CALCIUM 8.1* 8.0*    PT/INR: No results for input(s): LABPROT, INR in the last 72 hours. ABG:  INR: Will add last result for INR, ABG once components are confirmed Will add last 4 CBG results once components are confirmed  Assessment/Plan:  1. CV - SR in the 90's. 2.  Pulmonary - S/p left pigtail to left lung base. Left pigtail tube with 140 of output last 24 hours. Per Dr.CXR this am shows patient rotated to the left, no left pneumothorax, bilateral pleural effusions. Per Dr. Servando Snare, the patient is not an operative candidate for formal decortication.  3. Leukocytosis-WBC decreased to 17,100 this am    Donielle M ZimmermanPA-C 04/14/2018,8:14 AM   chest x-ray images personally reviewed Agree with above assessment and recommendations. Dahlia Byes MD

## 2018-04-14 NOTE — Progress Notes (Signed)
Referring Physician(s): Dr [name]  Supervising Physician: Corrie Mckusick  Patient Status:  [facility] - In-pt  Chief Complaint:  Left Chest tube placed 5/5 Additional chest tube placed left lung bas 5/11  Subjective:  New CT intact OP 160 cc in pleurvac-- serous 1st CT intact 1.4 L serous fluid  Some better Slow progress  Allergies: Codeine and Penicillins  Medications: Prior to Admission medications   Medication Sig Start Date End Date Taking? Authorizing Provider  albuterol (PROVENTIL HFA;VENTOLIN HFA) 108 (90 BASE) MCG/ACT inhaler Inhale 2 puffs into the lungs every 6 (six) hours as needed for wheezing or shortness of breath.    Yes [provider]  albuterol (PROVENTIL) (2.5 MG/3ML) 0.083% nebulizer solution Take 2.5 mg by nebulization every 6 (six) hours as needed for wheezing.   Yes [provider]  amLODipine (NORVASC) 5 MG tablet Take 5 mg by mouth daily.   Yes [provider]  aspirin EC 81 MG tablet Take 81 mg by mouth daily.   Yes [provider]  atorvastatin (LIPITOR) 20 MG tablet Take 20 mg by mouth daily.   Yes [provider]  carvedilol (COREG) 25 MG tablet Take 25 mg by mouth 2 (two) times daily with a meal.   Yes [provider]  Cholecalciferol (VITAMIN D-3 PO) Take 1 capsule by mouth daily.   Yes [provider]  Cyanocobalamin (VITAMIN B-12 PO) Take 500 mcg by mouth daily.    Yes [provider]  doxazosin (CARDURA) 4 MG tablet Take 4 mg by mouth at bedtime.    Yes [provider]  fluticasone (FLONASE) 50 MCG/ACT nasal spray Place 2 sprays into the nose daily as needed for allergies or rhinitis.    Yes [provider]  FOLIC ACID PO Take 1 tablet by mouth daily.    Yes [provider]  gabapentin (NEURONTIN) 300 MG capsule Take 300 mg by mouth 3 (three) times daily.    Yes [provider]  levothyroxine (SYNTHROID, LEVOTHROID) 50 MCG tablet Take 50  mcg by mouth daily.   Yes [provider]  Liniments (SALONPAS PAIN RELIEF PATCH EX) Apply 1 patch topically daily as needed (pain).   Yes [provider]  losartan-hydrochlorothiazide (HYZAAR) 50-12.5 MG per tablet Take 1 tablet by mouth daily.   Yes [provider]  traMADol (ULTRAM) 50 MG tablet Take 1 tablet (50 mg total) by mouth every 6 (six) hours as needed. Patient taking differently: Take 50 mg by mouth See admin instructions. Take 50 mg by mouth once a day as needed for pain and 50 mg at bedtime scheduled 03/17/18  Yes Daleen Bo, MD  trolamine salicylate (ASPERCREME) 10 % cream Apply 1 application topically as needed (pain).   Yes [provider]  cefdinir (OMNICEF) 300 MG capsule Take 1 capsule (300 mg total) by mouth 2 (two) times daily. Patient not taking: Reported on 04/01/2018 05/17/13   Orpah Greek, MD     Vital Signs: BP 137/63 (BP Location: Left Arm)   Pulse 89   Temp 98.8 F (37.1 C) (Oral)   Resp 20   Ht 5' 2.5" (1.588 m)   Wt 210 lb 5.1 oz (95.4 kg)   SpO2 99%   BMI 37.85 kg/m   Physical Exam  Pulmonary/Chest: Effort normal. She has wheezes.  Neurological: She is alert.  Skin: Skin is warm and dry.  Sites are clean and dry no bleeding NT No air leak in Pleurvac #1: 1.4  L serous color #2 160 cc serous color  Nursing note and vitals reviewed.  CXR today: Left chest tube in the upper chest is unchanged. A second drainage tube is projected over the left lung base. There is no evidence of left pneumothorax. Bilateral pleural effusions, left greater than right unchanged. Consolidation both lung bases left greater than right, unchanged.   Imaging: Ct Chest Wo Contrast  Result Date: 04/12/2018 CLINICAL DATA:  Post drain evaluation. EXAM: CT CHEST WITHOUT CONTRAST TECHNIQUE: Multidetector CT imaging of the chest was performed following the standard protocol without IV contrast. COMPARISON:  04/06/2018 CT  FINDINGS: Cardiovascular: Top-normal size heart with about pericardial effusion or thickening. Left main and three-vessel coronary arteriosclerosis. Moderate aortic atherosclerosis without aneurysm. The unenhanced pulmonary vasculature is unremarkable. Conventional branch pattern of the great vessels. Mediastinum/Nodes: Stable nonpathologic sized mediastinal lymph nodes. No thyromegaly or mass. Midline trachea and patent mainstem bronchi. The esophagus is unremarkable. Lungs/Pleura: There is a percutaneous left-sided posterior pigtail drainage catheter noted adjacent to the proximal descending thoracic aorta with interval decrease in loculated pleural effusion in its vicinity since prior. Residual loculated pleural fluid and thickening is seen along the periphery of the left hemithorax. Some decrease in pleural fluid at the left lung base though there is a small loculated collection medially measuring approximately 4.9 x 4.8 cm, series 3/75. Increase in the volume of left paramediastinal and pericardial loculated fluid measuring 6.2 x 3.7 x 7.8 cm is identified since prior exam, series 3/66 and series 6/62. Minimally larger right pleural effusion with adjacent compressive atelectasis is seen since prior. Upper Abdomen: Colonic diverticulosis is noted along the left colon. No acute abnormality within the upper abdomen. Musculoskeletal: No chest wall mass or suspicious bone lesions identified. IMPRESSION: 1. Left-sided percutaneous pigtail drainage catheter is seen adjacent to the proximal descending aorta with interval decrease in localized fluid adjacent to the catheter tip. 2. Interval increase in left pericardial and paramediastinal loculated fluid since prior exam measuring 6.2 x 3.7 x 7.8 cm. Relatively stable loculated pleural fluid and thickening along the periphery of the left hemithorax since prior. Some residual loculated pleural fluid along the left lung base medially measuring 4.9 x 4.8 cm. 3. Slight  increase in small right pleural effusion associated compressive atelectasis. 4. Top-normal size heart with left main and three-vessel coronary arteriosclerosis. Aortic Atherosclerosis (ICD10-I70.0). Electronically Signed   By: Ashley Royalty M.D.   On: 04/12/2018 03:44   Dg Chest Port 1 View  Result Date: 04/14/2018 CLINICAL DATA:  Pneumonia EXAM: PORTABLE CHEST 1 VIEW COMPARISON:  Apr 11, 2018 FINDINGS: Mediastinal contour is normal. The heart size is enlarged. Left chest tube in the upper chest is identified unchanged. A second drainage tube is projected over the left lung base. No definite pleural line is noted to suggest pneumothorax. Consolidation of the left mid and lung base as well as right lung base are identified unchanged. There are probable bilateral pleural effusions. The visualized skeletal structures are stable. IMPRESSION: Left chest tube in the upper chest is unchanged. A second drainage tube is projected over the left lung base. There is no evidence of left pneumothorax. Bilateral pleural effusions, left greater than right unchanged. Consolidation both lung bases left greater than right, unchanged. Electronically Signed   By: Abelardo Diesel M.D.   On: 04/14/2018 07:41   Dg Chest Port 1 View  Result Date: 04/11/2018 CLINICAL DATA:  Respiratory failure, cough. EXAM: PORTABLE CHEST 1 VIEW COMPARISON:  Radiograph of Apr 10, 2018. FINDINGS:  Stable cardiomegaly. Left-sided chest tube is unchanged in position. No definite pneumothorax is noted. Stable moderate left pleural effusion is noted with probable underlying atelectasis or infiltrate. Stable mild right pleural effusion is noted with associated atelectasis. Bony thorax is unremarkable. IMPRESSION: Stable position of left-sided chest tube with no definite pneumothorax. Stable moderate left pleural effusion is noted with probable underlying atelectasis or infiltrate. Stable mild right pleural effusion is noted with associated atelectasis.  Electronically Signed   By: Marijo Conception, M.D.   On: 04/11/2018 10:31   Ct Image Guided Drainage By Percutaneous Catheter  Result Date: 04/13/2018 INDICATION: 84 year old female with a history of empyema. Prior chest tube placement, with persisting loculated empyema. EXAM: CT-GUIDED CHEST DRAIN PLACEMENT MEDICATIONS: The patient is currently admitted to the [facility] and receiving intravenous antibiotics. The antibiotics were administered within an appropriate time frame prior to the initiation of the procedure. ANESTHESIA/SEDATION: 1.0 mg IV Versed 50 mcg IV Fentanyl Moderate Sedation Time:  16 minutes The patient was continuously monitored during the procedure by the interventional radiology nurse under my direct supervision. COMPLICATIONS: None TECHNIQUE: Informed written consent was obtained from the patient after a thorough discussion of the procedural risks, benefits and alternatives. All questions were addressed. Maximal Sterile Barrier Technique was utilized including caps, mask, sterile gowns, sterile gloves, sterile drape, hand hygiene and skin antiseptic. A timeout was performed prior to the initiation of the procedure. Patient positioned left decubitus on the CT gantry table. PROCEDURE: The operative field was prepped with chlorhexidine in a sterile fashion, and a sterile drape was applied covering the operative field. A sterile gown and sterile gloves were used for the procedure. Local anesthesia was provided with 1% Lidocaine. After the patient is prepped and draped in the usual sterile fashion, 1% lidocaine was used for local anesthesia. Using CT guidance, trocar needle was advanced into the loculated fluid at the posterior left lung base. Modified Seldinger technique was then used to place a 12 [place] drain. Drain was sutured in position and attached to water seal chamber. Final CT was performed confirming location. Patient tolerated the procedure well and remained hemodynamically stable  throughout. No complications were encountered and no significant blood loss. FINDINGS: CT demonstrates loculated fluid at the left lung base. Twelve French drain was placed into the loculated fluid posterior left lower pleural space. IMPRESSION: Status post CT-guided drain placement into loculated empyema of the left lung base. Signed, Dulcy Fanny. Earleen Newport, DO [facility]iology Electronically Signed   By: Corrie Mckusick D.O.   On: 04/13/2018 15:13    Labs:  CBC: Recent Labs    04/07/18 0407 04/09/18 0424 04/12/18 0522 04/14/18 0417  WBC 12.5* 12.7* 22.2* 17.1*  HGB 7.9* 8.1* 8.4* 8.0*  HCT 24.8* 25.3* 25.5* 25.1*  PLT 439* 434* 534* 509*    COAGS: Recent Labs    04/01/18 1928  INR 1.36  APTT 39*    BMP: Recent Labs    04/07/18 0407 04/08/18 0435 04/09/18 0424 04/12/18 0522 04/14/18 0417  NA 143  --  139 141 140  K 3.4*  --  3.6 3.6 3.6  CL 111  --  107 103 104  CO2 22  --  26 27 28   GLUCOSE 142*  --  112* 77 103*  BUN 13  --  6 10 11   CALCIUM 8.7*  --  8.2* 8.1* 8.0*  CREATININE 0.90 0.82 0.78 1.13* 0.91  GFRNONAA 55* >60 >60 42* 55*  GFRAA >60 >  60 >60 49* >60    LIVER FUNCTION TESTS: Recent Labs    04/01/18 1928  BILITOT 1.5*  AST 29  ALT 16  ALKPHOS 106  PROT 6.9  ALBUMIN 2.3*    Assessment and Plan:  Left empyema CT drains x 2 Will follow   Electronically Signed: Braxton Vantrease A, PA-C 04/14/2018, 9:39 AM   I spent a total of 15 Minutes at the the patient's bedside AND on the patient's [facility] floor or unit, greater than 50% of which was counseling/coordinating care for Chest tube drains

## 2018-04-14 NOTE — Procedures (Signed)
Interventional Radiology Procedure Note  Procedure: Image guided replacement of left thoracostomy tube, which was fractured at the hub in the last 24 hours.   New 80F pigtail drain placed   Complications: None  Recommendations:  - to waterseal chamber - Do not submerge  - Routine wound care   Signed,  Dulcy Fanny. Earleen Newport, DO

## 2018-04-15 ENCOUNTER — Inpatient Hospital Stay (HOSPITAL_COMMUNITY): Payer: Medicare Other

## 2018-04-15 DIAGNOSIS — R0902 Hypoxemia: Secondary | ICD-10-CM

## 2018-04-15 NOTE — Progress Notes (Signed)
Physical Therapy Treatment Patient Details Name: Monique Lin MRN: [id number] DOB: 16-Feb-1927 Today's Date: 04/15/2018    History of Present Illness 84 y/o F admitted 4/29 with 2 week hx of progressive SOB.  Seen 4/21 and treated for bronchitis with prednisone + antihistamines without improvement. CXR clear at that time.  Returned 4/29 with generalized weakness, decreased appetite and AMS.  Work up concerning for LLL opacity with associated pleural effusion & hypotension requiring vasopressors.  CXR has had intermittent white out on left despite bronchoscopy and attempts at thoracentesis (limited fluid returned).  Bronchoscopy demonstrated bronchomalacia.  Pt s/p pleural drain x 2 (to chest tube waterseal, continuous suction).     PT Comments    Pt is weak and deconditioned and gait distance is limited by fatigue and, right now, lines as she is on continuous wall suction to water seal.  Pt remains appropriate for SNF level rehab at discharge, but has relayed her preference for home to CSW.  If she does choose to go home she would be safest with 24/7 supervision/physical assist and max HH services.    Follow Up Recommendations  SNF;Supervision/Assistance - 24 hour     Equipment Recommendations  Rolling walker with 5" wheels;3in1 (PT)    Recommendations for Other Services   NA     Precautions / Restrictions Precautions Precautions: Fall Restrictions Weight Bearing Restrictions: No    Mobility  Bed Mobility Overal bed mobility: Needs Assistance Bed Mobility: Supine to Sit     Supine to sit: Min assist;HOB elevated     General bed mobility comments: Min assist to help progress legs over EOB and separately assist in lifting trunk up to sitting from elevated HOB.   Transfers Overall transfer level: Needs assistance Equipment used: Rolling walker (2 wheeled) Transfers: Sit to/from Stand Sit to Stand: Min assist         General transfer comment: Min assist to support trunk and  stabilize RW during transitions.   Ambulation/Gait Ambulation/Gait assistance: Min assist Ambulation Distance (Feet): 20 Feet Assistive device: Rolling walker (2 wheeled) Gait Pattern/deviations: Step-through pattern;Shuffle;Trunk flexed Gait velocity: decreased   General Gait Details: Pt with slow, shuffling gait pattern, has a hard time keeping her feet inside of RW, so trunk flexes.                     Balance Overall balance assessment: Needs assistance Sitting-balance support: Feet supported;Bilateral upper extremity supported Sitting balance-Leahy Scale: Fair     Standing balance support: Bilateral upper extremity supported Standing balance-Leahy Scale: Poor                              Cognition Arousal/Alertness: Awake/alert Behavior During Therapy: Flat affect Overall Cognitive Status: No family/caregiver present to determine baseline cognitive functioning                                 General Comments: Pt is very HOH making some communication difficult.              Pertinent Vitals/Pain Pain Assessment: Faces Faces Pain Scale: Hurts little more Pain Location: right leg and back (drain insertion sites) Pain Descriptors / Indicators: Discomfort;Grimacing;Guarding Pain Intervention(s): Limited activity within patient's tolerance;Monitored during session;Repositioned;RN gave pain meds during session           PT Goals (current goals can now be found in the  care plan section) Acute Rehab PT Goals Patient Stated Goal: to take it slow and recover Progress towards PT goals: Progressing toward goals    Frequency    Min 3X/week      PT Plan Current plan remains appropriate       AM-PAC PT "6 Clicks" Daily Activity  Outcome Measure  Difficulty turning over in bed (including adjusting bedclothes, sheets and blankets)?: Unable Difficulty moving from lying on back to sitting on the side of the bed? : Unable Difficulty  sitting down on and standing up from a chair with arms (e.g., wheelchair, bedside commode, etc,.)?: Unable Help needed moving to and from a bed to chair (including a wheelchair)?: A Little Help needed walking in hospital room?: A Little Help needed climbing 3-5 steps with a railing? : A Lot 6 Click Score: 11    End of Session Equipment Utilized During Treatment: Oxygen Activity Tolerance: Patient limited by fatigue;Patient limited by pain Patient left: in chair;with call bell/phone within reach   PT Visit Diagnosis: Unsteadiness on feet (R26.81);History of falling (Z91.81);Muscle weakness (generalized) (M62.81)     Time: 9244-6286 PT Time Calculation (min) (ACUTE ONLY): 39 min  Charges:  $Gait Training: 23-37 mins $Therapeutic Activity: 8-22 mins   Kwasi Joung B. [place], [place], DPT [phone]           04/15/2018, 10:21 PM

## 2018-04-15 NOTE — Social Work (Signed)
CSW has been informed that pt would like home with home health services. CSW attempted to reach pt daughter Friday, no response. RN Case Manager aware of pt preference, CSW will continue to follow for any change in disposition.  [name], [place] [facility] [phone]

## 2018-04-15 NOTE — Progress Notes (Signed)
 PCCM Progress Note    Brief Summary: 84 yo with Left lung PNA with empyema  S/p FOB w/ Bronchomalacia  S/p Pleural drain x 2   S:  No events overnight, no new complaints, continues to have drainage from the new drain 100 ml overnight but the posterior tube has no drainage.  O: Vitals:   04/14/18 1717 04/14/18 2108 04/15/18 0436 04/15/18 1406  BP: 138/61 (!) 133/57 (!) 144/67 (!) 121/59  Pulse: 100 91 91 83  Resp: 16   16  Temp: 98.6 F (37 C) 98.7 F (37.1 C) 98.7 F (37.1 C) 98.8 F (37.1 C)  TempSrc: Oral Oral Oral Oral  SpO2: 100% 100% 96% 100%  Weight: 190 lb 0.6 oz (86.2 kg)     Height:       General: Chronically ill appearing elderly female, NAD HEENT: [place]/AT, PERRL, EOM-I and MMM Neuro: alert and interactive, moving all ext to command Hrt: RRR, Nl S1/S2 and -M/R/G. Lungs: Bibasilar crackles, CT noted Abdomen: Soft, NT, ND and +BS Ext: -edema and -tenderness Skin: Intact  CBC Latest Ref Rng & Units 04/14/2018 04/12/2018 04/09/2018  WBC 4.0 - 10.5 K/uL 17.1(H) 22.2(H) 12.7(H)  Hemoglobin 12.0 - 15.0 g/dL 8.0(L) 8.4(L) 8.1(L)  Hematocrit 36.0 - 46.0 % 25.1(L) 25.5(L) 25.3(L)  Platelets 150 - 400 K/uL 509(H) 534(H) 434(H)    BMP Latest Ref Rng & Units 04/14/2018 04/12/2018 04/09/2018  Glucose 65 - 99 mg/dL 103(H) 77 112(H)  BUN 6 - 20 mg/dL 11 10 6   Creatinine 0.44 - 1.00 mg/dL 0.91 1.13(H) 0.78  Sodium 135 - 145 mmol/L 140 141 139  Potassium 3.5 - 5.1 mmol/L 3.6 3.6 3.6  Chloride 101 - 111 mmol/L 104 103 107  CO2 22 - 32 mmol/L 28 27 26   Calcium 8.9 - 10.3 mg/dL 8.0(L) 8.1(L) 8.2(L)   Antibiotics Rocephin 4/29 >>  Azithro 4/29 >> 4/30 Flagyl 5/7 > 5/11 Zosyn 5/11 >> Vanc 5/11 >>   Cultures L Pleural fluid 5/3 >> rare WBC, moderate GNR > Parvimonas Micra, prevotella, B lactam POS BCx2 4/29 >> negative  BCx2 4/29 >>neg  BC x 2 5/11 >>    I reviewed CXR myself, chest tubes in place, effusion essentially unchanged  Assessment/Plan  LLL PNA  L empyema  with parvimonas micra in culture, B lactam POS S/p tPA/Pulmozyme  Discussed with PCCM-NP  P: Continue CT to suction Change pleuravac on the posterior chest tube, doubt will make a difference at this point Mobilize as able Abx as vanc and zosyn Patient is not a surgical candidate per CVTS, treat medically, will likely need abx for a longtime, not sure what else can be done for that pleural space at this point. Follow cx data - noted Titrate O2 for sat of 88-92% IS and flutter valve Continue mucinex BID  Follow intermittent CXR   Rush Farmer, M.D. [facility] Pulmonary/Critical Care Medicine. Pager: [phone]. After hours pager: [phone].

## 2018-04-15 NOTE — Progress Notes (Signed)
1700 Changed the left medial chest tube collection container per PA request. Due to container possibly being tilted- true output difficult to measure.

## 2018-04-15 NOTE — Progress Notes (Signed)
Referring Physician(s): Dr. [name]  Supervising Physician: Aletta Edouard  Patient Status:  Monique Lin - In-pt  Chief Complaint: Empyema s/p chest tube x2  Subjective: States she feels ok.  Has no complaints.  Lying in bed.  Allergies: Codeine and Penicillins  Medications: Prior to Admission medications   Medication Sig Start Date End Date Taking? Authorizing Provider  albuterol (PROVENTIL HFA;VENTOLIN HFA) 108 (90 BASE) MCG/ACT inhaler Inhale 2 puffs into the lungs every 6 (six) hours as needed for wheezing or shortness of breath.    Yes [provider]  albuterol (PROVENTIL) (2.5 MG/3ML) 0.083% nebulizer solution Take 2.5 mg by nebulization every 6 (six) hours as needed for wheezing.   Yes [provider]  amLODipine (NORVASC) 5 MG tablet Take 5 mg by mouth daily.   Yes [provider]  aspirin EC 81 MG tablet Take 81 mg by mouth daily.   Yes [provider]  atorvastatin (LIPITOR) 20 MG tablet Take 20 mg by mouth daily.   Yes [provider]  carvedilol (COREG) 25 MG tablet Take 25 mg by mouth 2 (two) times daily with a meal.   Yes [provider]  Cholecalciferol (VITAMIN D-3 PO) Take 1 capsule by mouth daily.   Yes [provider]  Cyanocobalamin (VITAMIN B-12 PO) Take 500 mcg by mouth daily.    Yes [provider]  doxazosin (CARDURA) 4 MG tablet Take 4 mg by mouth at bedtime.    Yes [provider]  fluticasone (FLONASE) 50 MCG/ACT nasal spray Place 2 sprays into the nose daily as needed for allergies or rhinitis.    Yes [provider]  FOLIC ACID PO Take 1 tablet by mouth daily.    Yes [provider]  gabapentin (NEURONTIN) 300 MG capsule Take 300 mg by mouth 3 (three) times daily.    Yes [provider]  levothyroxine (SYNTHROID, LEVOTHROID) 50 MCG tablet Take 50 mcg by mouth daily.   Yes [provider]  Liniments (SALONPAS PAIN RELIEF PATCH EX) Apply 1  patch topically daily as needed (pain).   Yes [provider]  losartan-hydrochlorothiazide (HYZAAR) 50-12.5 MG per tablet Take 1 tablet by mouth daily.   Yes [provider]  traMADol (ULTRAM) 50 MG tablet Take 1 tablet (50 mg total) by mouth every 6 (six) hours as needed. Patient taking differently: Take 50 mg by mouth See admin instructions. Take 50 mg by mouth once a day as needed for pain and 50 mg at bedtime scheduled 03/17/18  Yes Daleen Bo, MD  trolamine salicylate (ASPERCREME) 10 % cream Apply 1 application topically as needed (pain).   Yes [provider]  cefdinir (OMNICEF) 300 MG capsule Take 1 capsule (300 mg total) by mouth 2 (two) times daily. Patient not taking: Reported on 04/01/2018 05/17/13   Orpah Greek, MD     Vital Signs: BP (!) 121/59 (BP Location: Left Arm)   Pulse 83   Temp 98.8 F (37.1 C) (Oral)   Resp 16   Ht 5' 2.5" (1.588 m)   Wt 190 lb 0.6 oz (86.2 kg)   SpO2 100%   BMI 34.20 kg/m   Physical Exam NAD, comfortable, lying in bed. Heart: RRR Chest:  Chest tubes intact x2.  Left posterior chest drain with 0 output recorded, 100 mL from medial drain.   Imaging: Ct Chest Wo Contrast  Result Date: 04/12/2018 CLINICAL DATA:  Post drain evaluation. EXAM: CT CHEST WITHOUT CONTRAST TECHNIQUE: Multidetector CT imaging  of the chest was performed following the standard protocol without IV contrast. COMPARISON:  04/06/2018 CT FINDINGS: Cardiovascular: Top-normal size heart with about pericardial effusion or thickening. Left main and three-vessel coronary arteriosclerosis. Moderate aortic atherosclerosis without aneurysm. The unenhanced pulmonary vasculature is unremarkable. Conventional branch pattern of the great vessels. Mediastinum/Nodes: Stable nonpathologic sized mediastinal lymph nodes. No thyromegaly or mass. Midline trachea and patent mainstem bronchi. The esophagus is unremarkable. Lungs/Pleura: There is a percutaneous  left-sided posterior pigtail drainage catheter noted adjacent to the proximal descending thoracic aorta with interval decrease in loculated pleural effusion in its vicinity since prior. Residual loculated pleural fluid and thickening is seen along the periphery of the left hemithorax. Some decrease in pleural fluid at the left lung base though there is a small loculated collection medially measuring approximately 4.9 x 4.8 cm, series 3/75. Increase in the volume of left paramediastinal and pericardial loculated fluid measuring 6.2 x 3.7 x 7.8 cm is identified since prior exam, series 3/66 and series 6/62. Minimally larger right pleural effusion with adjacent compressive atelectasis is seen since prior. Upper Abdomen: Colonic diverticulosis is noted along the left colon. No acute abnormality within the upper abdomen. Musculoskeletal: No chest wall mass or suspicious bone lesions identified. IMPRESSION: 1. Left-sided percutaneous pigtail drainage catheter is seen adjacent to the proximal descending aorta with interval decrease in localized fluid adjacent to the catheter tip. 2. Interval increase in left pericardial and paramediastinal loculated fluid since prior exam measuring 6.2 x 3.7 x 7.8 cm. Relatively stable loculated pleural fluid and thickening along the periphery of the left hemithorax since prior. Some residual loculated pleural fluid along the left lung base medially measuring 4.9 x 4.8 cm. 3. Slight increase in small right pleural effusion associated compressive atelectasis. 4. Top-normal size heart with left main and three-vessel coronary arteriosclerosis. Aortic Atherosclerosis (ICD10-I70.0). Electronically Signed   By: Ashley Royalty M.D.   On: 04/12/2018 03:44   Ir Catheter Tube Change  Result Date: 04/14/2018 INDICATION: 84 year old female with empyema. The patient has had 2 left-sided thoracostomy tubes placed, the most recent 24 hours ago. This recent tube has become fractured, requiring exchange.  EXAM: IMAGE GUIDED EXCHANGE OF LEFT thoracostomy tube MEDICATIONS: The patient is currently admitted to the hospital and receiving intravenous antibiotics. The antibiotics were administered within an appropriate time frame prior to the initiation of the procedure. ANESTHESIA/SEDATION: Fentanyl 25 mcg IV; Versed 0 mg IV No sedation COMPLICATIONS: None PROCEDURE: Informed written consent was obtained from the patient after a thorough discussion of the procedural risks, benefits and alternatives. All questions were addressed. Maximal Sterile Barrier Technique was utilized including caps, mask, sterile gowns, sterile gloves, sterile drape, hand hygiene and skin antiseptic. A timeout was performed prior to the initiation of the procedure. Patient positioned left decubitus on the IR table. The indwelling drain and the left back were prepped and draped in the usual sterile fashion. Modified Seldinger technique was then used to place a new 12 [place] drain. Drain was sutured in position after 1% lidocaine for local anesthesia. Patient tolerated the procedure well and remained hemodynamically stable throughout. No complications were encountered and no significant blood loss. IMPRESSION: Status post exchange of damaged left thoracostomy tube with placement of a new 12 French drain. Signed, Dulcy Fanny. Earleen Newport, DO [facility] Electronically Signed   By: Corrie Mckusick D.O.   On: 04/14/2018 15:05   Dg Chest Port 1 View  Result Date: 04/15/2018 CLINICAL DATA:  Follow-up pleural effusions  with chest tube treatment. Empyema, left lower lobe pneumonia. EXAM: PORTABLE CHEST 1 VIEW COMPARISON:  Chest x-ray of Apr 14, 2018 FINDINGS: There remains increased density in the left mid and lower lung with obscuration of the hemidiaphragm. The pleural space drainage catheters are in stable position superiorly and inferiorly. There is patchy increased density at the right lung base  which is stable. There is no mediastinal shift. The left heart border is obscured. There is calcification in the wall of the thoracic aorta. There is degenerative disc disease of the thoracic spine. IMPRESSION: Stable appearance of the left-sided empyema or pleural effusion. No pneumothorax. The chest tubes are in stable position. Stable atelectasis or pleural effusion at the right lung base. Electronically Signed   By: David  Martinique M.D.   On: 04/15/2018 07:47   Dg Chest Port 1 View  Result Date: 04/14/2018 CLINICAL DATA:  Pneumonia EXAM: PORTABLE CHEST 1 VIEW COMPARISON:  Apr 11, 2018 FINDINGS: Mediastinal contour is normal. The heart size is enlarged. Left chest tube in the upper chest is identified unchanged. A second drainage tube is projected over the left lung base. No definite pleural line is noted to suggest pneumothorax. Consolidation of the left mid and lung base as well as right lung base are identified unchanged. There are probable bilateral pleural effusions. The visualized skeletal structures are stable. IMPRESSION: Left chest tube in the upper chest is unchanged. A second drainage tube is projected over the left lung base. There is no evidence of left pneumothorax. Bilateral pleural effusions, left greater than right unchanged. Consolidation both lung bases left greater than right, unchanged. Electronically Signed   By: Abelardo Diesel M.D.   On: 04/14/2018 07:41   Ct Image Guided Drainage By Percutaneous Catheter  Result Date: 04/13/2018 INDICATION: 84 year old female with a history of empyema. Prior chest tube placement, with persisting loculated empyema. EXAM: CT-GUIDED CHEST DRAIN PLACEMENT MEDICATIONS: The patient is currently admitted to the hospital and receiving intravenous antibiotics. The antibiotics were administered within an appropriate time frame prior to the initiation of the procedure. ANESTHESIA/SEDATION: 1.0 mg IV Versed 50 mcg IV Fentanyl Moderate Sedation Time:  16 minutes  The patient was continuously monitored during the procedure by the interventional radiology nurse under my direct supervision. COMPLICATIONS: None TECHNIQUE: Informed written consent was obtained from the patient after a thorough discussion of the procedural risks, benefits and alternatives. All questions were addressed. Maximal Sterile Barrier Technique was utilized including caps, mask, sterile gowns, sterile gloves, sterile drape, hand hygiene and skin antiseptic. A timeout was performed prior to the initiation of the procedure. Patient positioned left decubitus on the CT gantry table. PROCEDURE: The operative field was prepped with chlorhexidine in a sterile fashion, and a sterile drape was applied covering the operative field. A sterile gown and sterile gloves were used for the procedure. Local anesthesia was provided with 1% Lidocaine. After the patient is prepped and draped in the usual sterile fashion, 1% lidocaine was used for local anesthesia. Using CT guidance, trocar needle was advanced into the loculated fluid at the posterior left lung base. Modified Seldinger technique was then used to place a 12 [place] drain. Drain was sutured in position and attached to water seal chamber. Final CT was performed confirming location. Patient tolerated the procedure well and remained hemodynamically stable throughout. No complications were encountered and no significant blood loss. FINDINGS: CT demonstrates loculated fluid at the left lung base. Twelve French drain was placed into the loculated fluid posterior left lower pleural  space. IMPRESSION: Status post CT-guided drain placement into loculated empyema of the left lung base. Signed, Dulcy Fanny. Earleen Newport, DO [facility] Electronically Signed   By: Corrie Mckusick D.O.   On: 04/13/2018 15:13    Labs:  CBC: Recent Labs    04/07/18 0407 04/09/18 0424 04/12/18 0522 04/14/18 0417  WBC 12.5* 12.7* 22.2*  17.1*  HGB 7.9* 8.1* 8.4* 8.0*  HCT 24.8* 25.3* 25.5* 25.1*  PLT 439* 434* 534* 509*    COAGS: Recent Labs    04/01/18 1928  INR 1.36  APTT 39*    BMP: Recent Labs    04/07/18 0407 04/08/18 0435 04/09/18 0424 04/12/18 0522 04/14/18 0417  NA 143  --  139 141 140  K 3.4*  --  3.6 3.6 3.6  CL 111  --  107 103 104  CO2 22  --  26 27 28   GLUCOSE 142*  --  112* 77 103*  BUN 13  --  6 10 11   CALCIUM 8.7*  --  8.2* 8.1* 8.0*  CREATININE 0.90 0.82 0.78 1.13* 0.91  GFRNONAA 55* >60 >60 42* 55*  GFRAA >60 >60 >60 49* >60    LIVER FUNCTION TESTS: Recent Labs    04/01/18 1928  BILITOT 1.5*  AST 29  ALT 16  ALKPHOS 106  PROT 6.9  ALBUMIN 2.3*    Assessment and Plan: Empyema s/p chest tube placement x2 Patient with little output from chest tubes.  Posterior drain with 0 mL output-- could change out Pleurvac to ensure. New drain with 100 mL overnight. CXR shows good position but still with unchanged effusion/empyema. Afebrile.  No CBC today- last value was 17K yesterday.  Continue for now per TCTS.  IR following.    Electronically Signed: Docia Barrier, PA 04/15/2018, 3:07 PM   I spent a total of 15 Minutes at the the patient's bedside AND on the patient's hospital floor or unit, greater than 50% of which was counseling/coordinating care for empyema.

## 2018-04-15 NOTE — Progress Notes (Addendum)
[address]       [place] [address]             [phone]         Subjective: 140 cc out of CT yesterday CXR appearance is stable Feels a little better  Objective: Vital signs in last 24 hours: Temp:  [98.6 F (37 C)-99.1 F (37.3 C)] 98.7 F (37.1 C) (05/13 0436) Pulse Rate:  [83-100] 91 (05/13 0436) Cardiac Rhythm: Normal sinus rhythm (05/13 0702) Resp:  [16-20] 16 (05/12 1717) BP: (125-163)/(54-94) 144/67 (05/13 0436) SpO2:  [93 %-100 %] 96 % (05/13 0436) Weight:  [86.2 kg (190 lb 0.6 oz)] 86.2 kg (190 lb 0.6 oz) (05/12 1717)  Hemodynamic parameters for last 24 hours:    Intake/Output from previous day: 05/12 0701 - 05/13 0700 In: -  Out: 300 [Urine:200; Chest Tube:100] Intake/Output this shift: No intake/output data recorded.  General appearance: alert, cooperative and no distress Heart: regular rate and rhythm Lungs: dim Left lower fields Abdomen: soft, non-tender Extremities: no edema  Lab Results: Recent Labs    04/14/18 0417  WBC 17.1*  HGB 8.0*  HCT 25.1*  PLT 509*   BMET:  Recent Labs    04/14/18 0417  NA 140  K 3.6  CL 104  CO2 28  GLUCOSE 103*  BUN 11  CREATININE 0.91  CALCIUM 8.0*    PT/INR: No results for input(s): LABPROT, INR in the last 72 hours. ABG    Component Value Date/Time   PHART 7.472 (H) 04/02/2018 0158   HCO3 17.5 (L) 04/02/2018 0158   TCO2 18 (L) 04/02/2018 0158   ACIDBASEDEF 5.0 (H) 04/02/2018 0158   O2SAT 100.0 04/02/2018 0158   CBG (last 3)  No results for input(s): GLUCAP in the last 72 hours.  Meds Scheduled Meds: . aspirin  81 mg Oral Daily  . docusate sodium  100 mg Oral Daily  . enoxaparin (LOVENOX) injection  40 mg Subcutaneous Q24H  . feeding supplement (PRO-STAT SUGAR FREE 64)  30 mL Oral TID BM  . furosemide  40 mg Intravenous Daily  . guaiFENesin  1,200 mg Oral BID  . levothyroxine  50 mcg Oral QAC breakfast  . mouth rinse  15 mL Mouth Rinse BID  . metoprolol  tartrate  12.5 mg Oral BID  . multivitamin with minerals  1 tablet Oral Daily  . potassium chloride SA  20 mEq Oral Daily  . sodium chloride flush  5 mL Intracatheter Q8H   Continuous Infusions: . sodium chloride 10 mL/hr at 04/11/18 1509  . piperacillin-tazobactam (ZOSYN)  IV 3.375 g (04/15/18 1032)  . vancomycin Stopped (04/15/18 1221)   PRN Meds:.sodium chloride, acetaminophen, albuterol, oxyCODONE, polyethylene glycol  Xrays Ir Catheter Tube Change  Result Date: 04/14/2018 INDICATION: 84 year old female with empyema. The patient has had 2 left-sided thoracostomy tubes placed, the most recent 24 hours ago. This recent tube has become fractured, requiring exchange. EXAM: IMAGE GUIDED EXCHANGE OF LEFT thoracostomy tube MEDICATIONS: The patient is currently admitted to the hospital and receiving intravenous antibiotics. The antibiotics were administered within an appropriate time frame prior to the initiation of the procedure. ANESTHESIA/SEDATION: Fentanyl 25 mcg IV; Versed 0 mg IV No sedation COMPLICATIONS: None PROCEDURE: Informed written consent was obtained from the patient after a thorough discussion of the procedural risks, benefits and alternatives. All questions were addressed. Maximal Sterile Barrier Technique was utilized including caps, mask, sterile gowns, sterile gloves, sterile drape, hand hygiene and  skin antiseptic. A timeout was performed prior to the initiation of the procedure. Patient positioned left decubitus on the IR table. The indwelling drain and the left back were prepped and draped in the usual sterile fashion. Modified Seldinger technique was then used to place a new 12 [place] drain. Drain was sutured in position after 1% lidocaine for local anesthesia. Patient tolerated the procedure well and remained hemodynamically stable throughout. No complications were encountered and no significant blood loss. IMPRESSION: Status post exchange of damaged left thoracostomy tube with  placement of a new 12 French drain. Signed, Dulcy Fanny. Earleen Newport, DO [facility] Electronically Signed   By: Corrie Mckusick D.O.   On: 04/14/2018 15:05   Dg Chest Port 1 View  Result Date: 04/15/2018 CLINICAL DATA:  Follow-up pleural effusions with chest tube treatment. Empyema, left lower lobe pneumonia. EXAM: PORTABLE CHEST 1 VIEW COMPARISON:  Chest x-ray of Apr 14, 2018 FINDINGS: There remains increased density in the left mid and lower lung with obscuration of the hemidiaphragm. The pleural space drainage catheters are in stable position superiorly and inferiorly. There is patchy increased density at the right lung base which is stable. There is no mediastinal shift. The left heart border is obscured. There is calcification in the wall of the thoracic aorta. There is degenerative disc disease of the thoracic spine. IMPRESSION: Stable appearance of the left-sided empyema or pleural effusion. No pneumothorax. The chest tubes are in stable position. Stable atelectasis or pleural effusion at the right lung base. Electronically Signed   By: David  Martinique M.D.   On: 04/15/2018 07:47   Dg Chest Port 1 View  Result Date: 04/14/2018 CLINICAL DATA:  Pneumonia EXAM: PORTABLE CHEST 1 VIEW COMPARISON:  Apr 11, 2018 FINDINGS: Mediastinal contour is normal. The heart size is enlarged. Left chest tube in the upper chest is identified unchanged. A second drainage tube is projected over the left lung base. No definite pleural line is noted to suggest pneumothorax. Consolidation of the left mid and lung base as well as right lung base are identified unchanged. There are probable bilateral pleural effusions. The visualized skeletal structures are stable. IMPRESSION: Left chest tube in the upper chest is unchanged. A second drainage tube is projected over the left lung base. There is no evidence of left pneumothorax. Bilateral pleural effusions, left greater than right  unchanged. Consolidation both lung bases left greater than right, unchanged. Electronically Signed   By: Abelardo Diesel M.D.   On: 04/14/2018 07:41   Ct Image Guided Drainage By Percutaneous Catheter  Result Date: 04/13/2018 INDICATION: 84 year old female with a history of empyema. Prior chest tube placement, with persisting loculated empyema. EXAM: CT-GUIDED CHEST DRAIN PLACEMENT MEDICATIONS: The patient is currently admitted to the hospital and receiving intravenous antibiotics. The antibiotics were administered within an appropriate time frame prior to the initiation of the procedure. ANESTHESIA/SEDATION: 1.0 mg IV Versed 50 mcg IV Fentanyl Moderate Sedation Time:  16 minutes The patient was continuously monitored during the procedure by the interventional radiology nurse under my direct supervision. COMPLICATIONS: None TECHNIQUE: Informed written consent was obtained from the patient after a thorough discussion of the procedural risks, benefits and alternatives. All questions were addressed. Maximal Sterile Barrier Technique was utilized including caps, mask, sterile gowns, sterile gloves, sterile drape, hand hygiene and skin antiseptic. A timeout was performed prior to the initiation of the procedure. Patient positioned left decubitus on the CT gantry table. PROCEDURE: The operative field was prepped with  chlorhexidine in a sterile fashion, and a sterile drape was applied covering the operative field. A sterile gown and sterile gloves were used for the procedure. Local anesthesia was provided with 1% Lidocaine. After the patient is prepped and draped in the usual sterile fashion, 1% lidocaine was used for local anesthesia. Using CT guidance, trocar needle was advanced into the loculated fluid at the posterior left lung base. Modified Seldinger technique was then used to place a 12 [place] drain. Drain was sutured in position and attached to water seal chamber. Final CT was performed confirming location.  Patient tolerated the procedure well and remained hemodynamically stable throughout. No complications were encountered and no significant blood loss. FINDINGS: CT demonstrates loculated fluid at the left lung base. Twelve French drain was placed into the loculated fluid posterior left lower pleural space. IMPRESSION: Status post CT-guided drain placement into loculated empyema of the left lung base. Signed, Dulcy Fanny. Earleen Newport, DO [facility] Electronically Signed   By: Corrie Mckusick D.O.   On: 04/13/2018 15:13    Assessment/Plan:  1 slow clinical progress 2 conts both tubes for now, although doesn't appear to be draining 3 not a surgical candidate per Dr Servando Snare 4 medical management per primary   LOS: 14 days    John Giovanni 04/15/2018 Agree with above I would dc original tube as that space is well drained and there is minimal output from it. Revonda Standard Roxan Hockey, MD [facility] [phone]

## 2018-04-15 NOTE — Progress Notes (Signed)
Subjective:  Patient denies any chest pains to his breathing has improved.  Objective:  Vital Signs in the last 24 hours: Temp:  [98.6 F (37 C)-99.1 F (37.3 C)] 98.7 F (37.1 C) (05/13 0436) Pulse Rate:  [83-100] 91 (05/13 0436) Resp:  [16-20] 16 (05/12 1717) BP: (125-163)/(54-94) 144/67 (05/13 0436) SpO2:  [93 %-100 %] 96 % (05/13 0436) Weight:  [86.2 kg (190 lb 0.6 oz)] 86.2 kg (190 lb 0.6 oz) (05/12 1717)  Intake/Output from previous day: 05/12 0701 - 05/13 0700 In: -  Out: 300 [Urine:200; Chest Tube:100] Intake/Output from this shift: No intake/output data recorded.  Physical Exam: Neck: no adenopathy, no carotid bruit, no JVD and supple, symmetrical, trachea midline Lungs: decreased breath sounds in bases bilaterally with rhonchi, left more than right Heart: regular rate and rhythm, S1, S2 normal and soft systolic murmur noted Abdomen: soft, non-tender; bowel sounds normal; no masses,  no organomegaly Extremities: extremities normal, atraumatic, no cyanosis or edema  Lab Results: Recent Labs    04/14/18 0417  WBC 17.1*  HGB 8.0*  PLT 509*   Recent Labs    04/14/18 0417  NA 140  K 3.6  CL 104  CO2 28  GLUCOSE 103*  BUN 11  CREATININE 0.91   No results for input(s): TROPONINI in the last 72 hours.  Invalid input(s): CK, MB Hepatic Function Panel No results for input(s): PROT, ALBUMIN, AST, ALT, ALKPHOS, BILITOT, BILIDIR, IBILI in the last 72 hours. No results for input(s): CHOL in the last 72 hours. No results for input(s): PROTIME in the last 72 hours.  Imaging: Imaging results have been reviewed and Ir Catheter Tube Change  Result Date: 04/14/2018 INDICATION: 84 year old female with empyema. The patient has had 2 left-sided thoracostomy tubes placed, the most recent 24 hours ago. This recent tube has become fractured, requiring exchange. EXAM: IMAGE GUIDED EXCHANGE OF LEFT thoracostomy tube MEDICATIONS: The patient is currently admitted to the  hospital and receiving intravenous antibiotics. The antibiotics were administered within an appropriate time frame prior to the initiation of the procedure. ANESTHESIA/SEDATION: Fentanyl 25 mcg IV; Versed 0 mg IV No sedation COMPLICATIONS: None PROCEDURE: Informed written consent was obtained from the patient after a thorough discussion of the procedural risks, benefits and alternatives. All questions were addressed. Maximal Sterile Barrier Technique was utilized including caps, mask, sterile gowns, sterile gloves, sterile drape, hand hygiene and skin antiseptic. A timeout was performed prior to the initiation of the procedure. Patient positioned left decubitus on the IR table. The indwelling drain and the left back were prepped and draped in the usual sterile fashion. Modified Seldinger technique was then used to place a new 12 [place] drain. Drain was sutured in position after 1% lidocaine for local anesthesia. Patient tolerated the procedure well and remained hemodynamically stable throughout. No complications were encountered and no significant blood loss. IMPRESSION: Status post exchange of damaged left thoracostomy tube with placement of a new 12 French drain. Signed, Dulcy Fanny. Earleen Newport, DO [facility] Electronically Signed   By: Corrie Mckusick D.O.   On: 04/14/2018 15:05   Dg Chest Port 1 View  Result Date: 04/15/2018 CLINICAL DATA:  Follow-up pleural effusions with chest tube treatment. Empyema, left lower lobe pneumonia. EXAM: PORTABLE CHEST 1 VIEW COMPARISON:  Chest x-ray of Apr 14, 2018 FINDINGS: There remains increased density in the left mid and lower lung with obscuration of the hemidiaphragm. The pleural space drainage catheters are in stable position superiorly and  inferiorly. There is patchy increased density at the right lung base which is stable. There is no mediastinal shift. The left heart border is obscured. There is calcification in the  wall of the thoracic aorta. There is degenerative disc disease of the thoracic spine. IMPRESSION: Stable appearance of the left-sided empyema or pleural effusion. No pneumothorax. The chest tubes are in stable position. Stable atelectasis or pleural effusion at the right lung base. Electronically Signed   By: David  Martinique M.D.   On: 04/15/2018 07:47   Dg Chest Port 1 View  Result Date: 04/14/2018 CLINICAL DATA:  Pneumonia EXAM: PORTABLE CHEST 1 VIEW COMPARISON:  Apr 11, 2018 FINDINGS: Mediastinal contour is normal. The heart size is enlarged. Left chest tube in the upper chest is identified unchanged. A second drainage tube is projected over the left lung base. No definite pleural line is noted to suggest pneumothorax. Consolidation of the left mid and lung base as well as right lung base are identified unchanged. There are probable bilateral pleural effusions. The visualized skeletal structures are stable. IMPRESSION: Left chest tube in the upper chest is unchanged. A second drainage tube is projected over the left lung base. There is no evidence of left pneumothorax. Bilateral pleural effusions, left greater than right unchanged. Consolidation both lung bases left greater than right, unchanged. Electronically Signed   By: Abelardo Diesel M.D.   On: 04/14/2018 07:41   Ct Image Guided Drainage By Percutaneous Catheter  Result Date: 04/13/2018 INDICATION: 84 year old female with a history of empyema. Prior chest tube placement, with persisting loculated empyema. EXAM: CT-GUIDED CHEST DRAIN PLACEMENT MEDICATIONS: The patient is currently admitted to the hospital and receiving intravenous antibiotics. The antibiotics were administered within an appropriate time frame prior to the initiation of the procedure. ANESTHESIA/SEDATION: 1.0 mg IV Versed 50 mcg IV Fentanyl Moderate Sedation Time:  16 minutes The patient was continuously monitored during the procedure by the interventional radiology nurse under my direct  supervision. COMPLICATIONS: None TECHNIQUE: Informed written consent was obtained from the patient after a thorough discussion of the procedural risks, benefits and alternatives. All questions were addressed. Maximal Sterile Barrier Technique was utilized including caps, mask, sterile gowns, sterile gloves, sterile drape, hand hygiene and skin antiseptic. A timeout was performed prior to the initiation of the procedure. Patient positioned left decubitus on the CT gantry table. PROCEDURE: The operative field was prepped with chlorhexidine in a sterile fashion, and a sterile drape was applied covering the operative field. A sterile gown and sterile gloves were used for the procedure. Local anesthesia was provided with 1% Lidocaine. After the patient is prepped and draped in the usual sterile fashion, 1% lidocaine was used for local anesthesia. Using CT guidance, trocar needle was advanced into the loculated fluid at the posterior left lung base. Modified Seldinger technique was then used to place a 12 [place] drain. Drain was sutured in position and attached to water seal chamber. Final CT was performed confirming location. Patient tolerated the procedure well and remained hemodynamically stable throughout. No complications were encountered and no significant blood loss. FINDINGS: CT demonstrates loculated fluid at the left lung base. Twelve French drain was placed into the loculated fluid posterior left lower pleural space. IMPRESSION: Status post CT-guided drain placement into loculated empyema of the left lung base. Signed, Dulcy Fanny. Earleen Newport, DO [facility] Electronically Signed   By: Corrie Mckusick D.O.   On: 04/13/2018 15:13    Cardiac Studies:  Assessment/Plan:  Left  lung pneumoniacomplicated by empyemaStatus post bronchoscopynoted to have bronchomalacia. Status post CT-guided drain left pleural space drainage 2 Possible  pleuropericarditis Marked leukocytosis Status post septic shock. 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 Check labs in a.m. Increase ambulation as tolerated. Out of  bed to chair    LOS: 14 days    [name] 04/15/2018, 11:31 AM

## 2018-04-16 ENCOUNTER — Inpatient Hospital Stay (HOSPITAL_COMMUNITY): Payer: Medicare Other

## 2018-04-16 LAB — CBC
HCT: 23.8 % — ABNORMAL LOW (ref 36.0–46.0)
Hemoglobin: 7.7 g/dL — ABNORMAL LOW (ref 12.0–15.0)
MCH: 29.1 pg (ref 26.0–34.0)
MCHC: 32.4 g/dL (ref 30.0–36.0)
MCV: 89.8 fL (ref 78.0–100.0)
PLATELETS: 436 10*3/uL — AB (ref 150–400)
RBC: 2.65 MIL/uL — AB (ref 3.87–5.11)
RDW: 15.1 % (ref 11.5–15.5)
WBC: 13 10*3/uL — AB (ref 4.0–10.5)

## 2018-04-16 LAB — BASIC METABOLIC PANEL
ANION GAP: 10 (ref 5–15)
BUN: 16 mg/dL (ref 6–20)
CALCIUM: 8.2 mg/dL — AB (ref 8.9–10.3)
CO2: 31 mmol/L (ref 22–32)
Chloride: 96 mmol/L — ABNORMAL LOW (ref 101–111)
Creatinine, Ser: 0.97 mg/dL (ref 0.44–1.00)
GFR calc Af Amer: 59 mL/min — ABNORMAL LOW (ref >60)
GFR, EST NON AFRICAN AMERICAN: 51 mL/min — AB (ref >60)
Glucose, Bld: 97 mg/dL (ref 65–99)
POTASSIUM: 3.3 mmol/L — AB (ref 3.5–5.1)
SODIUM: 137 mmol/L (ref 135–145)

## 2018-04-16 LAB — VANCOMYCIN, TROUGH: VANCOMYCIN TR: 17 ug/mL (ref 15–20)

## 2018-04-16 MED ORDER — FERROUS SULFATE 325 (65 FE) MG PO TABS
325.0000 mg | ORAL_TABLET | Freq: Three times a day (TID) | ORAL | Status: DC
  Administered 2018-04-16 – 2018-04-25 (×28): 325 mg via ORAL
  Filled 2018-04-16 (×28): qty 1

## 2018-04-16 MED ORDER — POTASSIUM CHLORIDE CRYS ER 20 MEQ PO TBCR
40.0000 meq | EXTENDED_RELEASE_TABLET | Freq: Once | ORAL | Status: AC
  Administered 2018-04-16: 40 meq via ORAL
  Filled 2018-04-16: qty 2

## 2018-04-16 NOTE — Progress Notes (Addendum)
      [address]       ,[place] [address]             [phone]           Subjective: Patient states she has to "pee" this am  Objective: Vital signs in last 24 hours: Temp:  [98.4 F (36.9 C)-99.2 F (37.3 C)] 99.2 F (37.3 C) (05/14 0609) Pulse Rate:  [83-89] 85 (05/14 0609) Cardiac Rhythm: Normal sinus rhythm (05/13 1900) Resp:  [16-20] 20 (05/14 0609) BP: (121-138)/(53-63) 127/53 (05/14 0609) SpO2:  [100 %] 100 % (05/14 0609)     Intake/Output from previous day: 05/13 0701 - 05/14 0700 In: 100 [P.O.:50; IV Piggyback:50] Out: 0    Physical Exam:  Cardiovascular: RRR Pulmonary: Diminished at bases bilaterally Pigtail chest tubes: with light yellow pleural fluid drainage (from new left base catheter) no drainage from original pigtail catheter  Lab Results: CBC: Recent Labs    04/14/18 0417 04/16/18 0405  WBC 17.1* 13.0*  HGB 8.0* 7.7*  HCT 25.1* 23.8*  PLT 509* 436*   BMET:  Recent Labs    04/14/18 0417 04/16/18 0405  NA 140 137  K 3.6 3.3*  CL 104 96*  CO2 28 31  GLUCOSE 103* 97  BUN 11 16  CREATININE 0.91 0.97  CALCIUM 8.0* 8.2*    PT/INR: No results for input(s): LABPROT, INR in the last 72 hours. ABG:  INR: Will add last result for INR, ABG once components are confirmed Will add last 4 CBG results once components are confirmed  Assessment/Plan:  1. CV - SR in the 80's. 2.  Pulmonary - S/p left pigtail to left lung base. Left pigtail tube with output but unsure of amount. I marked Pleura Vac this am. No CXR ordered for today. Will order for am. On 3 liters of oxygen via [place]. As per Dr. Leonarda Salon note yesterday, he recommended removing original pigtail as not much drainage. 3. Leukocytosis-WBC decreased to 13,000 this am 4. ID-On Vancomycin and Zosyn 5. Anemia-H and H decreased to 7.7 and 23.8.Per medicine   Cathe Bilger M ZimmermanPA-C 04/16/2018,7:43 AM

## 2018-04-16 NOTE — Progress Notes (Addendum)
Referring Physician(s): Dr Nelda Marseille  Supervising Physician: Daryll Brod  Patient Status:  [facility] - In-pt  Chief Complaint:  Left empyema  Subjective:  Left chest tube drains x 2 #1 5/5; #2 5/11  No real OP from original tubing No changes in CXR Chest tubes intact No air leak for either  Allergies: Codeine and Penicillins  Medications: Prior to Admission medications   Medication Sig Start Date End Date Taking? Authorizing Provider  albuterol (PROVENTIL HFA;VENTOLIN HFA) 108 (90 BASE) MCG/ACT inhaler Inhale 2 puffs into the lungs every 6 (six) hours as needed for wheezing or shortness of breath.    Yes [provider]  albuterol (PROVENTIL) (2.5 MG/3ML) 0.083% nebulizer solution Take 2.5 mg by nebulization every 6 (six) hours as needed for wheezing.   Yes [provider]  amLODipine (NORVASC) 5 MG tablet Take 5 mg by mouth daily.   Yes [provider]  aspirin EC 81 MG tablet Take 81 mg by mouth daily.   Yes [provider]  atorvastatin (LIPITOR) 20 MG tablet Take 20 mg by mouth daily.   Yes [provider]  carvedilol (COREG) 25 MG tablet Take 25 mg by mouth 2 (two) times daily with a meal.   Yes [provider]  Cholecalciferol (VITAMIN D-3 PO) Take 1 capsule by mouth daily.   Yes [provider]  Cyanocobalamin (VITAMIN B-12 PO) Take 500 mcg by mouth daily.    Yes [provider]  doxazosin (CARDURA) 4 MG tablet Take 4 mg by mouth at bedtime.    Yes [provider]  fluticasone (FLONASE) 50 MCG/ACT nasal spray Place 2 sprays into the nose daily as needed for allergies or rhinitis.    Yes [provider]  FOLIC ACID PO Take 1 tablet by mouth daily.    Yes [provider]  gabapentin (NEURONTIN) 300 MG capsule Take 300 mg by mouth 3 (three) times daily.    Yes [provider]  levothyroxine (SYNTHROID, LEVOTHROID) 50 MCG tablet Take 50 mcg by mouth daily.   Yes  [provider]  Liniments (SALONPAS PAIN RELIEF PATCH EX) Apply 1 patch topically daily as needed (pain).   Yes [provider]  losartan-hydrochlorothiazide (HYZAAR) 50-12.5 MG per tablet Take 1 tablet by mouth daily.   Yes [provider]  traMADol (ULTRAM) 50 MG tablet Take 1 tablet (50 mg total) by mouth every 6 (six) hours as needed. Patient taking differently: Take 50 mg by mouth See admin instructions. Take 50 mg by mouth once a day as needed for pain and 50 mg at bedtime scheduled 03/17/18  Yes Daleen Bo, MD  trolamine salicylate (ASPERCREME) 10 % cream Apply 1 application topically as needed (pain).   Yes [provider]  cefdinir (OMNICEF) 300 MG capsule Take 1 capsule (300 mg total) by mouth 2 (two) times daily. Patient not taking: Reported on 04/01/2018 05/17/13   Orpah Greek, MD     Vital Signs: BP (!) 111/50 (BP Location: Right Arm)   Pulse 79   Temp 98.9 F (37.2 C) (Oral)   Resp 20   Ht 5' 2.5" (1.588 m)   Wt 190 lb 0.6 oz (86.2 kg)   SpO2 99%   BMI 34.20 kg/m   Physical Exam  Constitutional: She is oriented to person, place, and time.  Neurological: She is alert and oriented to person, place, and time.  Skin: Skin is warm and dry.  Sites are clean and dry NT no  bleeding No OP from original drain Newest drain has approx 100 cc or so in Pleurvac  No air leak for either  Nursing note and vitals reviewed.  Left superior chest tube removed without complication per Dr Nelda Marseille order  Imaging: Ir Catheter Tube Change  Result Date: 04/14/2018 INDICATION: 84 year old female with empyema. The patient has had 2 left-sided thoracostomy tubes placed, the most recent 24 hours ago. This recent tube has become fractured, requiring exchange. EXAM: IMAGE GUIDED EXCHANGE OF LEFT thoracostomy tube MEDICATIONS: The patient is currently admitted to the hospital and receiving intravenous antibiotics. The antibiotics were administered  within an appropriate time frame prior to the initiation of the procedure. ANESTHESIA/SEDATION: Fentanyl 25 mcg IV; Versed 0 mg IV No sedation COMPLICATIONS: None PROCEDURE: Informed written consent was obtained from the patient after a thorough discussion of the procedural risks, benefits and alternatives. All questions were addressed. Maximal Sterile Barrier Technique was utilized including caps, mask, sterile gowns, sterile gloves, sterile drape, hand hygiene and skin antiseptic. A timeout was performed prior to the initiation of the procedure. Patient positioned left decubitus on the IR table. The indwelling drain and the left back were prepped and draped in the usual sterile fashion. Modified Seldinger technique was then used to place a new 12 [place] drain. Drain was sutured in position after 1% lidocaine for local anesthesia. Patient tolerated the procedure well and remained hemodynamically stable throughout. No complications were encountered and no significant blood loss. IMPRESSION: Status post exchange of damaged left thoracostomy tube with placement of a new 12 French drain. Signed, Dulcy Fanny. Earleen Newport, DO [facility] Radiology Electronically Signed   By: Corrie Mckusick D.O.   On: 04/14/2018 15:05   Dg Chest Port 1 View  Result Date: 04/15/2018 CLINICAL DATA:  Follow-up pleural effusions with chest tube treatment. Empyema, left lower lobe pneumonia. EXAM: PORTABLE CHEST 1 VIEW COMPARISON:  Chest x-ray of Apr 14, 2018 FINDINGS: There remains increased density in the left mid and lower lung with obscuration of the hemidiaphragm. The pleural space drainage catheters are in stable position superiorly and inferiorly. There is patchy increased density at the right lung base which is stable. There is no mediastinal shift. The left heart border is obscured. There is calcification in the wall of the thoracic aorta. There is degenerative disc disease of the thoracic  spine. IMPRESSION: Stable appearance of the left-sided empyema or pleural effusion. No pneumothorax. The chest tubes are in stable position. Stable atelectasis or pleural effusion at the right lung base. Electronically Signed   By: David  Martinique M.D.   On: 04/15/2018 07:47   Dg Chest Port 1 View  Result Date: 04/14/2018 CLINICAL DATA:  Pneumonia EXAM: PORTABLE CHEST 1 VIEW COMPARISON:  Apr 11, 2018 FINDINGS: Mediastinal contour is normal. The heart size is enlarged. Left chest tube in the upper chest is identified unchanged. A second drainage tube is projected over the left lung base. No definite pleural line is noted to suggest pneumothorax. Consolidation of the left mid and lung base as well as right lung base are identified unchanged. There are probable bilateral pleural effusions. The visualized skeletal structures are stable. IMPRESSION: Left chest tube in the upper chest is unchanged. A second drainage tube is projected over the left lung base. There is no evidence of left pneumothorax. Bilateral pleural effusions, left greater than right unchanged. Consolidation both lung bases left greater than right, unchanged. Electronically Signed   By: Abelardo Diesel M.D.   On:  04/14/2018 07:41   Ct Image Guided Drainage By Percutaneous Catheter  Result Date: 04/13/2018 INDICATION: 84 year old female with a history of empyema. Prior chest tube placement, with persisting loculated empyema. EXAM: CT-GUIDED CHEST DRAIN PLACEMENT MEDICATIONS: The patient is currently admitted to the hospital and receiving intravenous antibiotics. The antibiotics were administered within an appropriate time frame prior to the initiation of the procedure. ANESTHESIA/SEDATION: 1.0 mg IV Versed 50 mcg IV Fentanyl Moderate Sedation Time:  16 minutes The patient was continuously monitored during the procedure by the interventional radiology nurse under my direct supervision. COMPLICATIONS: None TECHNIQUE: Informed written consent was  obtained from the patient after a thorough discussion of the procedural risks, benefits and alternatives. All questions were addressed. Maximal Sterile Barrier Technique was utilized including caps, mask, sterile gowns, sterile gloves, sterile drape, hand hygiene and skin antiseptic. A timeout was performed prior to the initiation of the procedure. Patient positioned left decubitus on the CT gantry table. PROCEDURE: The operative field was prepped with chlorhexidine in a sterile fashion, and a sterile drape was applied covering the operative field. A sterile gown and sterile gloves were used for the procedure. Local anesthesia was provided with 1% Lidocaine. After the patient is prepped and draped in the usual sterile fashion, 1% lidocaine was used for local anesthesia. Using CT guidance, trocar needle was advanced into the loculated fluid at the posterior left lung base. Modified Seldinger technique was then used to place a 12 [place] drain. Drain was sutured in position and attached to water seal chamber. Final CT was performed confirming location. Patient tolerated the procedure well and remained hemodynamically stable throughout. No complications were encountered and no significant blood loss. FINDINGS: CT demonstrates loculated fluid at the left lung base. Twelve French drain was placed into the loculated fluid posterior left lower pleural space. IMPRESSION: Status post CT-guided drain placement into loculated empyema of the left lung base. Signed, Dulcy Fanny. Earleen Newport, DO [facility] Radiology Electronically Signed   By: Corrie Mckusick D.O.   On: 04/13/2018 15:13    Labs:  CBC: Recent Labs    04/09/18 0424 04/12/18 0522 04/14/18 0417 04/16/18 0405  WBC 12.7* 22.2* 17.1* 13.0*  HGB 8.1* 8.4* 8.0* 7.7*  HCT 25.3* 25.5* 25.1* 23.8*  PLT 434* 534* 509* 436*    COAGS: Recent Labs    04/01/18 1928  INR 1.36  APTT 39*    BMP: Recent Labs     04/09/18 0424 04/12/18 0522 04/14/18 0417 04/16/18 0405  NA 139 141 140 137  K 3.6 3.6 3.6 3.3*  CL 107 103 104 96*  CO2 26 27 28 31   GLUCOSE 112* 77 103* 97  BUN 6 10 11 16   CALCIUM 8.2* 8.1* 8.0* 8.2*  CREATININE 0.78 1.13* 0.91 0.97  GFRNONAA >60 42* 55* 51*  GFRAA >60 49* >60 59*    LIVER FUNCTION TESTS: Recent Labs    04/01/18 1928  BILITOT 1.5*  AST 29  ALT 16  ALKPHOS 106  PROT 6.9  ALBUMIN 2.3*    Assessment and Plan:  Left empyema Chest tube drain x 2 No OP from most superior drain (original drain placed 5/5) [place] with Dr Nelda Marseille-- removed superior left chest tube per order   Electronically Signed: Rylin Saez A, PA-C 04/16/2018, 2:25 PM   I spent a total of 25 Minutes at the the patient's bedside AND on the patient's hospital floor or unit, greater than 50% of which was counseling/coordinating care for Empyema chest tube  x 2--- removal #1.

## 2018-04-16 NOTE — Progress Notes (Signed)
Occupational Therapy Treatment Patient Details Name: DARLYN REPSHER MRN: [id number] DOB: [birth year] Today's Date: 04/16/2018    History of present illness 84 y/o F admitted 4/29 with 2 week hx of progressive SOB.  Seen 4/21 and treated for bronchitis with prednisone + antihistamines without improvement. CXR clear at that time.  Returned 4/29 with generalized weakness, decreased appetite and AMS.  Work up concerning for LLL opacity with associated pleural effusion & hypotension requiring vasopressors.  CXR has had intermittent white out on left despite bronchoscopy and attempts at thoracentesis (limited fluid returned).  Bronchoscopy demonstrated bronchomalacia.  Pt s/p pleural drain x 2 (to chest tube waterseal, continuous suction).    OT comments  Pt not very motivated and requires max encouragement to participate in OOB activity, however pt is progressing. Pt stood at [facility] for clothing mgt and hygiene with mod Afor balance/support this session. Per RN, pt;s family now wants to take her home. OT still recommending SNF for further mgt and rehab. If family is taking her home then she will need 24 hour care/sup ad +2 for mobility safety with HH therapies maximized. OT will continue to follow acutely  Follow Up Recommendations  SNF;Supervision/Assistance - 24 hour(pt/family want to go home now)    Equipment Recommendations  3 in 1 bedside commode    Recommendations for Other Services      Precautions / Restrictions Precautions Precautions: Fall Restrictions Weight Bearing Restrictions: No       Mobility Bed Mobility Overal bed mobility: Needs Assistance Bed Mobility: Supine to Sit     Supine to sit: Min assist;HOB elevated     General bed mobility comments: Min assist to help progress legs over EOB and separately assist in lifting trunk up to sitting from elevated HOB.   Transfers Overall transfer level: Needs assistance Equipment used: Rolling walker (2 wheeled) Transfers: Sit  to/from Stand Sit to Stand: Min assist         General transfer comment: Min assist to support trunk and stabilize RW during transitions, cues to stand upright    Balance Overall balance assessment: Needs assistance Sitting-balance support: Feet supported;Bilateral upper extremity supported Sitting balance-Leahy Scale: Fair     Standing balance support: Bilateral upper extremity supported;During functional activity Standing balance-Leahy Scale: Poor Standing balance comment: pt stood at [facility] for clothing mgt and hygiene with mod Afor balance/support this session                           ADL either performed or assessed with clinical judgement   ADL Overall ADL's : Needs assistance/impaired     Grooming: Min guard;Sitting;Wash/dry hands;Wash/dry Sports administrator: Minimal assistance;Cueing for sequencing;Cueing for safety;Stand-pivot;Grab bars;Ambulation;RW;BSC Toilet Transfer Details (indicate cue type and reason): pt stood at [facility] for clothing mgt and hygiene with mod Afor balance/support this session Toileting- Clothing Manipulation and Hygiene: Maximal assistance;Moderate assistance Toileting - Clothing Manipulation Details (indicate cue type and reason): pt stood at [facility] for clothing mgt and hygiene with mod Afor balance/support this session     Functional mobility during ADLs: Minimal assistance;Rolling walker;Cueing for safety;Cueing for sequencing       Vision Patient Visual Report: No change from baseline     Perception     Praxis      Cognition Arousal/Alertness: Awake/alert Behavior During Therapy: Flat affect Overall Cognitive Status: No family/caregiver present to  determine baseline cognitive functioning                                 General Comments: Pt is very HOH making some communication difficult.         Exercises     Shoulder Instructions       General Comments      Pertinent  Vitals/ Pain       Pain Assessment: Faces Faces Pain Scale: Hurts little more Pain Location: right leg and back (drain insertion sites) Pain Descriptors / Indicators: Discomfort;Grimacing;Guarding Pain Intervention(s): Limited activity within patient's tolerance;Monitored during session;Premedicated before session;Repositioned  Home Living                                          Prior Functioning/Environment              Frequency  Min 2X/week        Progress Toward Goals  OT Goals(current goals can now be found in the care plan section)  Progress towards OT goals: Progressing toward goals     Plan Discharge plan remains appropriate    Co-evaluation                 AM-PAC PT "6 Clicks" Daily Activity     Outcome Measure   Help from another person eating meals?: A Little Help from another person taking care of personal grooming?: A Little Help from another person toileting, which includes using toliet, bedpan, or urinal?: A Lot Help from another person bathing (including washing, rinsing, drying)?: A Lot Help from another person to put on and taking off regular upper body clothing?: A Little Help from another person to put on and taking off regular lower body clothing?: Total 6 Click Score: 14    End of Session Equipment Utilized During Treatment: Rolling walker;Gait belt;Other (comment)(BSC)  OT Visit Diagnosis: Other abnormalities of gait and mobility (R26.89);Pain;Muscle weakness (generalized) (M62.81) Pain - Right/Left: Right Pain - part of body: Leg   Activity Tolerance Patient tolerated treatment well   Patient Left with call bell/phone within reach;in chair;with chair alarm set   Nurse Communication      Functional Assessment Tool Used: AM-PAC 6 Clicks Daily Activity   Time: 0786-7544 OT Time Calculation (min): 29 min  Charges: OT G-codes **NOT FOR INPATIENT CLASS** Functional Assessment Tool Used: AM-PAC 6 Clicks Daily  Activity OT General Charges $OT Visit: 1 Visit OT Treatments $Self Care/Home Management : 8-22 mins $Therapeutic Activity: 8-22 mins     Britt Bottom 04/16/2018, 2:40 PM

## 2018-04-16 NOTE — Progress Notes (Signed)
[place] PCCM Progress Note    Brief Summary: 84 yo with Left lung PNA with empyema  S/p FOB w/ Bronchomalacia  S/p Pleural drain x 2   S:  No events overnight, no new complaints, continues to have drainage from the lateral tube but amount not known.  No drainage from the original posterior tube.    O: Vitals:   04/15/18 2106 04/16/18 0446 04/16/18 0609 04/16/18 1000  BP:   (!) 127/53 (!) 109/51  Pulse:   85 93  Resp: 18 16 20    Temp: 98.4 F (36.9 C)  99.2 F (37.3 C)   TempSrc: Oral Oral Oral   SpO2: 100%  100% 99%  Weight:      Height:       General: Chronically ill appearing female, NAD, in bed HEENT: [place]/AT, PERRL, EOM-I and MMM Neuro: Arousable, moving all ext to command Hrt: RRR, Nl S1/S2 and -M/R/G Lungs: Decreased BS diffusely, CT noted Abdomen: Soft, NT, ND and +BS Ext: -edema and -tenderness Skin: intact  CBC Latest Ref Rng & Units 04/16/2018 04/14/2018 04/12/2018  WBC 4.0 - 10.5 K/uL 13.0(H) 17.1(H) 22.2(H)  Hemoglobin 12.0 - 15.0 g/dL 7.7(L) 8.0(L) 8.4(L)  Hematocrit 36.0 - 46.0 % 23.8(L) 25.1(L) 25.5(L)  Platelets 150 - 400 K/uL 436(H) 509(H) 534(H)   BMP Latest Ref Rng & Units 04/16/2018 04/14/2018 04/12/2018  Glucose 65 - 99 mg/dL 97 103(H) 77  BUN 6 - 20 mg/dL 16 11 10   Creatinine 0.44 - 1.00 mg/dL 0.97 0.91 1.13(H)  Sodium 135 - 145 mmol/L 137 140 141  Potassium 3.5 - 5.1 mmol/L 3.3(L) 3.6 3.6  Chloride 101 - 111 mmol/L 96(L) 104 103  CO2 22 - 32 mmol/L 31 28 27   Calcium 8.9 - 10.3 mg/dL 8.2(L) 8.0(L) 8.1(L)   Antibiotics Rocephin 4/29 >>  Azithro 4/29 >> 4/30 Flagyl 5/7 > 5/11 Zosyn 5/11 >> Vanc 5/11 >>   Cultures L Pleural fluid 5/3 >> rare WBC, moderate GNR > Parvimonas Micra, prevotella, B lactam POS BCx2 4/29 >> negative  BCx2 4/29 >>neg  BC x 2 5/11 >>    I reviewed CXR myself, no change in pleural effusion  Assessment/Plan  LLL PNA  L empyema with parvimonas micra in culture, B lactam POS S/p tPA/Pulmozyme  Physical  deconditioning Hypoxemia Discussed with PCCM-NP  P: - Continue the new CT to suction - Remove posterior chest tube as no drainage or pleural variation - PT/OT and OOB to chair - Abx as vanc/zosyn, no MRSA noted but I am unfamiliar with the bacteria growing from the pleural space, in general if unable to physically clear the pleural space we continue abx for 1 month then reassess clinically, however with these bacteria growing would recommend involvement of ID for recommendations. - Patient is not a surgical candidate per CVTS, treat medically, will likely need abx for a longtime, not sure what else can be done for that pleural space at this point. - Titrate O2 for sat of 88-92% - IS and flutter valve - Continue mucinex BID  - Follow intermittent CXR   CVTS to manage chest tube, recommend calling ID.    PCCM will sign off, please call back if needed.  Rush Farmer, M.D. [facility] Pulmonary/Critical Care Medicine. Pager: [phone]. After hours pager: [phone].

## 2018-04-16 NOTE — Care Management Note (Addendum)
Case Management Note  Patient Details  Name: Monique Lin MRN: [id number] Date of Birth: 1927-07-21  Subjective/Objective:                    Action/Plan:   Expected Discharge Date:                  Expected Discharge Plan:  [place]  In-House Referral:     Discharge planning Services  CM Consult  Post Acute Care Choice:    Choice offered to:     DME Arranged:    DME Agency:     HH Arranged:    [place] Agency:     Status of Service:  In process, will continue to follow  If discussed at Long Length of Stay Meetings, dates discussed:  04-16-18 At length of stay meeting Dr Reynaldo Minium recommended LTAC referral. Called DR Harwani's office at [phone]. Awaiting call back. Spoke with Dr [name] , patient clinically improving does not feel LTAC needed at this time.  Patient's daughter Ginnifer Creelman [phone] at bedside.  Patient lives with other daughter Carlyne [phone]. Daughters would prefer patient to go to short term rehab before discharging to home, however, patient does not want to go to SNF. [name] states her sister and her will honor their mother's wishes.  Patient will need 3 in 1 and walker and would like home health through [facility]. [name] and her sister have already started asking friends, neighbors and family if they can check on their mother after she discharges home and they ( daughters are at work). Spoke with Blanchard Mane at [facility] . Ms Clydene Laming will follow patient. If IV ABX are needed at home patient will have to have a teachable person who is available when ABX are due.  [name] would like to speak with SW regarding if her mother did agree to go to short term rehab what her choices would be . SW aware. [name] states she visits  her mother daily at lunch time "anywhere between noon and 2 pm"  Additional Comments:  Marilu Favre, RN 04/16/2018, 9:48 AM

## 2018-04-16 NOTE — Progress Notes (Signed)
Subjective:  Patient denies any chest pain states breathing has improved. Walked  to the bathroom with assistance. Chest tubes minimal drainage. Clinically appears to be improved white count trending down.  Objective:  Vital Signs in the last 24 hours: Temp:  [98.4 F (36.9 C)-99.2 F (37.3 C)] 99.2 F (37.3 C) (05/14 0609) Pulse Rate:  [83-89] 85 (05/14 0609) Resp:  [16-20] 20 (05/14 0609) BP: (121-138)/(53-63) 127/53 (05/14 0609) SpO2:  [100 %] 100 % (05/14 0609)  Intake/Output from previous day: 05/13 0701 - 05/14 0700 In: 100 [P.O.:50; IV Piggyback:50] Out: 0  Intake/Output from this shift: No intake/output data recorded.  Physical Exam: Neck: no adenopathy, no carotid bruit, no JVD and supple, symmetrical, trachea midline Lungs: decreased breath sound at bases left more than right with rhonchi noted Heart: regular rate and rhythm, S1, S2 normal and soft systolic murmur noted Abdomen: soft, non-tender; bowel sounds normal; no masses,  no organomegaly Extremities: extremities normal, atraumatic, no cyanosis or edema  Lab Results: Recent Labs    04/14/18 0417 04/16/18 0405  WBC 17.1* 13.0*  HGB 8.0* 7.7*  PLT 509* 436*   Recent Labs    04/14/18 0417 04/16/18 0405  NA 140 137  K 3.6 3.3*  CL 104 96*  CO2 28 31  GLUCOSE 103* 97  BUN 11 16  CREATININE 0.91 0.97   No results for input(s): TROPONINI in the last 72 hours.  Invalid input(s): CK, MB Hepatic Function Panel No results for input(s): PROT, ALBUMIN, AST, ALT, ALKPHOS, BILITOT, BILIDIR, IBILI in the last 72 hours. No results for input(s): CHOL in the last 72 hours. No results for input(s): PROTIME in the last 72 hours.  Imaging: Imaging results have been reviewed and Ir Catheter Tube Change  Result Date: 04/14/2018 INDICATION: 84 year old female with empyema. The patient has had 2 left-sided thoracostomy tubes placed, the most recent 24 hours ago. This recent tube has become fractured, requiring  exchange. EXAM: IMAGE GUIDED EXCHANGE OF LEFT thoracostomy tube MEDICATIONS: The patient is currently admitted to the hospital and receiving intravenous antibiotics. The antibiotics were administered within an appropriate time frame prior to the initiation of the procedure. ANESTHESIA/SEDATION: Fentanyl 25 mcg IV; Versed 0 mg IV No sedation COMPLICATIONS: None PROCEDURE: Informed written consent was obtained from the patient after a thorough discussion of the procedural risks, benefits and alternatives. All questions were addressed. Maximal Sterile Barrier Technique was utilized including caps, mask, sterile gowns, sterile gloves, sterile drape, hand hygiene and skin antiseptic. A timeout was performed prior to the initiation of the procedure. Patient positioned left decubitus on the IR table. The indwelling drain and the left back were prepped and draped in the usual sterile fashion. Modified Seldinger technique was then used to place a new 12 [place] drain. Drain was sutured in position after 1% lidocaine for local anesthesia. Patient tolerated the procedure well and remained hemodynamically stable throughout. No complications were encountered and no significant blood loss. IMPRESSION: Status post exchange of damaged left thoracostomy tube with placement of a new 12 French drain. Signed, Dulcy Fanny. Earleen Newport, DO [facility] Electronically Signed   By: Corrie Mckusick D.O.   On: 04/14/2018 15:05   Dg Chest Port 1 View  Result Date: 04/15/2018 CLINICAL DATA:  Follow-up pleural effusions with chest tube treatment. Empyema, left lower lobe pneumonia. EXAM: PORTABLE CHEST 1 VIEW COMPARISON:  Chest x-ray of Apr 14, 2018 FINDINGS: There remains increased density in the left mid and lower lung  with obscuration of the hemidiaphragm. The pleural space drainage catheters are in stable position superiorly and inferiorly. There is patchy increased density at the right lung  base which is stable. There is no mediastinal shift. The left heart border is obscured. There is calcification in the wall of the thoracic aorta. There is degenerative disc disease of the thoracic spine. IMPRESSION: Stable appearance of the left-sided empyema or pleural effusion. No pneumothorax. The chest tubes are in stable position. Stable atelectasis or pleural effusion at the right lung base. Electronically Signed   By: David  Martinique M.D.   On: 04/15/2018 07:47    Cardiac Studies:  Assessment/Plan:  Left lung pneumoniacomplicated by empyemaStatus post bronchoscopynoted to have bronchomalacia. Status post CT-guided drain left pleural spacedrainage 2 Possible pleuropericarditis Marked leukocytosis Status post septic shock. Status postacute renal injury History of hypertension Hyperlipidemia Hypothyroidism Acute on chronic anemia probably secondary to hydration rule out GI loss History of bronchial asthma History of GI stromal tumor in the past History of GI bleed in the past Plan Check stool for occult blood Add Feosol as per orders Will discuss with CVTS regarding removing both the tubes as they are not draining much.   LOS: 15 days    [name] 04/16/2018, 9:52 AM

## 2018-04-16 NOTE — Social Work (Addendum)
Verbally requested to stop by room with options for SNF- per RN Case Manager. Pt daughter stated she would be in room between 12-2 for lunch to discuss offers with her mother. This writer stopped by the room x3, pt daughter not there during any time. Will continue to attempt to check in with pt and hopefully daughter. Aware pt would like to go home but pt daughters want to discuss options.   4:00pm- CSW spoke with pt daughter, pt daughter respectful of mothers wishes to go home, but is going to discuss options with her sister and mother this evening, aware packet was left on counter in pt room. When placing packet in the room CSW also alerted pt niece who was present and pt that packet was being left, both expressed understanding.   [name], [place] Work [phone]

## 2018-04-16 NOTE — Progress Notes (Addendum)
Pharmacy Antibiotic Note  Monique Lin is a 84 y.o. female admitted on 04/01/2018 with pneumonia with empyema. Pharmacy has been consulted for Zosyn and Vancomycin dosing. Patient with noted allergy to PCN listed as stomach cramps - benefits outweigh risks.   Deemed not a surgical candidate and chest tubes with minimal drainage. WBC down to 13. Afebrile. SCr down to 0.97 with estimated CrCl~ 41 mL/min.  L-pleural fluid culture with anaerobes.  Vancomycin trough is 17 and therapeutic.   Plan: Zosyn 3.375g IV every 8 hours - 4 hr infusion.  Vancomycin 1250 mg IV every 24 hours. Goal trough 15-20 mcg/ml Monitor renal function, culture results, and clinical status.  Consider narrowing to Unasyn  Height: 5' 2.5" (158.8 cm) Weight: 190 lb 0.6 oz (86.2 kg) IBW/kg (Calculated) : 51.25  Temp (24hrs), Avg:98.8 F (37.1 C), Min:98.4 F (36.9 C), Max:99.2 F (37.3 C)  Recent Labs  Lab 04/12/18 0522 04/14/18 0417 04/16/18 0405 04/16/18 1013  WBC 22.2* 17.1* 13.0*  --   CREATININE 1.13* 0.91 0.97  --   VANCOTROUGH  --   --   --  17    Estimated Creatinine Clearance: 41.3 mL/min (by C-G formula based on SCr of 0.97 mg/dL).    Allergies  Allergen Reactions  . Codeine Other (See Comments)    Stomach cramps  . Penicillins Other (See Comments)    "Bad stomach cramps" Has patient had a PCN reaction causing immediate rash, facial/tongue/throat swelling, SOB or lightheadedness with hypotension: Unk Has patient had a PCN reaction causing severe rash involving mucus membranes or skin necrosis: Unk Has patient had a PCN reaction that required hospitalization: Unk Has patient had a PCN reaction occurring within the last 10 years: No If all of the above answers are "NO", then may proceed with Cephalosporin use.      Antimicrobials this admission: Azith 4/29 >> 5/5 CTX 4/29 >> 5/9 Flagyl 5/7 >>5/11 Vanc 5/11 >> Zosyn 5/11 >>  Dose adjustments this admission: 5/14 VT 17 on 1250 mg q24h  - cont  Microbiology results: 4/29 flu neg 4/29 strep pna neg 4/29 urine neg  4/29 blood x 2 neg 4/29 blood x 2 neg 4/29 resp pcr neg 4/29 pertussis pcr neg 4/30 MRSA pcr neg 5/3 left pleural fluid - Parvimonas micra (gp anaerobe) + Prevotella spp (gn anaerobe, beta lactamase +) 5/11 Blood >> ngtd  Thank you for allowing pharmacy to be a part of this patient's care.  Renold Genta, PharmD, BCPS Clinical Pharmacist Clinical phone for 04/16/2018 until 3p is [phone] After 3p, please call Main Rx at [phone] for assistance 04/16/2018 11:12 AM

## 2018-04-16 NOTE — Consult Note (Signed)
   [facility] CM Inpatient Consult   04/16/2018  Monique Lin [birth year] [id number]  Received request from inpatient LCSW to cross check for [facility] eligibility.  This patient is not eligible for [facility] Services.  Reason:  Not a beneficiary currently attributed to one of the [place].  Membership roster used to verify non- eligible status.  Made inpatient LCSW aware.  Marthenia Rolling, MSN-Ed, RN,BSN [facility] Liaison [phone]

## 2018-04-17 ENCOUNTER — Inpatient Hospital Stay (HOSPITAL_COMMUNITY): Payer: Medicare Other

## 2018-04-17 LAB — CBC
HCT: 25.9 % — ABNORMAL LOW (ref 36.0–46.0)
Hemoglobin: 8.2 g/dL — ABNORMAL LOW (ref 12.0–15.0)
MCH: 28.9 pg (ref 26.0–34.0)
MCHC: 31.7 g/dL (ref 30.0–36.0)
MCV: 91.2 fL (ref 78.0–100.0)
PLATELETS: 453 10*3/uL — AB (ref 150–400)
RBC: 2.84 MIL/uL — AB (ref 3.87–5.11)
RDW: 14.3 % (ref 11.5–15.5)
WBC: 11 10*3/uL — AB (ref 4.0–10.5)

## 2018-04-17 LAB — BASIC METABOLIC PANEL
ANION GAP: 12 (ref 5–15)
BUN: 15 mg/dL (ref 6–20)
CALCIUM: 8.3 mg/dL — AB (ref 8.9–10.3)
CHLORIDE: 93 mmol/L — AB (ref 101–111)
CO2: 32 mmol/L (ref 22–32)
CREATININE: 1.03 mg/dL — AB (ref 0.44–1.00)
GFR calc non Af Amer: 47 mL/min — ABNORMAL LOW (ref >60)
GFR, EST AFRICAN AMERICAN: 55 mL/min — AB (ref >60)
Glucose, Bld: 95 mg/dL (ref 65–99)
POTASSIUM: 3.3 mmol/L — AB (ref 3.5–5.1)
SODIUM: 137 mmol/L (ref 135–145)

## 2018-04-17 MED ORDER — POTASSIUM CHLORIDE CRYS ER 20 MEQ PO TBCR
40.0000 meq | EXTENDED_RELEASE_TABLET | Freq: Once | ORAL | Status: AC
  Administered 2018-04-17: 40 meq via ORAL

## 2018-04-17 MED ORDER — BOOST / RESOURCE BREEZE PO LIQD CUSTOM
1.0000 | Freq: Three times a day (TID) | ORAL | Status: DC
  Administered 2018-04-17 – 2018-04-25 (×23): 1 via ORAL

## 2018-04-17 NOTE — Progress Notes (Signed)
Nutrition Follow-up  DOCUMENTATION CODES:   Obesity unspecified  INTERVENTION:   -Continue MVI daily -Continue 30 ml Prostat TID, each supplement provides 100 kcals and 15 grams protein -Boost Breeze po TID, each supplement provides 250 kcal and 9 grams of protein  NUTRITION DIAGNOSIS:   Inadequate oral intake related to decreased appetite as evidenced by meal completion < 50%, per patient/family report.  Ongoing  GOAL:   Patient will meet greater than or equal to 90% of their needs  Progressing  MONITOR:   PO intake, Supplement acceptance, Labs, Weight trends, Skin, I & O's  REASON FOR ASSESSMENT:   LOS    ASSESSMENT:   84 year old female with past medical history significant for hypertension, hypothyroidism, hyperlipidemia, history of bronchial asthma, morbid obesity, history of GI stromal tumor in the past history of upper GI bleed in the past, came to the ER complaining of cough associated with left sided chest pain or difficulty breathing and generalized weakness and poor appetite for last 7 days.  4/30- s/p thoracentesis (aspirated minimal drainage- approximately 1/4 ml of very viscous material) 5/1- CXR revealed improved aeration in lt upper lobe  5/2- s/p bronch revealed bronchomalacia  5/3- s/p lt thoracentesis- 90 ml drained and sent to lab for analysis 5/5- s/p lt chest tube placement per IR 5/11- second lt chest tube placed 5/12- new lt chest tube placed due to fracture at the hub  Reviewed I/O's: +603 ml x 24 hours, +1.4 L since 04/03/18. 140 ml chest tube output x 24 hours.   Spoke with pt, who reports feeling better today. She endorses continued minimal intake, but improving. Noted Boost Breeze supplement at bedside; pt consumed 50%. She is also taking the Prostat supplements. Pt reports she will continue with both Prostat and Boost Breeze to help optimize nutritional status.   CSW working on potential SNF placement.   Labs reviewed: K: 3.3 (on PO  supplementation).   Diet Order:   Diet Order           Diet Heart Room service appropriate? Yes; Fluid consistency: Thin  Diet effective now          EDUCATION NEEDS:   Education needs have been addressed  Skin:  Skin Assessment: Reviewed RN Assessment  Last BM:  04/16/18  Height:   Ht Readings from Last 1 Encounters:  04/01/18 5' 2.5" (1.588 m)    Weight:   Wt Readings from Last 1 Encounters:  04/14/18 190 lb 0.6 oz (86.2 kg)    Ideal Body Weight:  51.1 kg  BMI:  Body mass index is 34.2 kg/m.  Estimated Nutritional Needs:   Kcal:  1600-1800  Protein:  65-80 grams  Fluid:  > 1.6 L    Amere Iott A. Jimmye Norman, RD, LDN, CDE Pager: [phone] After hours Pager: [phone]

## 2018-04-17 NOTE — Progress Notes (Signed)
Referring Physician(s): Dr Nelda Marseille  Supervising Physician: Sandi Mariscal  Patient Status:  [facility] - In-pt  Chief Complaint:  Left empyema Left chest tube drains x 2  Subjective:  Upper left chest tube was removed yesterday per Dr Nelda Marseille Left lower chest tube still in place No air leak OP serous in pleurvac--- 140 cc OP per RN  She is up in chair; feeling some better slowly  Allergies: Codeine and Penicillins  Medications: Prior to Admission medications   Medication Sig Start Date End Date Taking? Authorizing Provider  albuterol (PROVENTIL HFA;VENTOLIN HFA) 108 (90 BASE) MCG/ACT inhaler Inhale 2 puffs into the lungs every 6 (six) hours as needed for wheezing or shortness of breath.    Yes [provider]  albuterol (PROVENTIL) (2.5 MG/3ML) 0.083% nebulizer solution Take 2.5 mg by nebulization every 6 (six) hours as needed for wheezing.   Yes [provider]  amLODipine (NORVASC) 5 MG tablet Take 5 mg by mouth daily.   Yes [provider]  aspirin EC 81 MG tablet Take 81 mg by mouth daily.   Yes [provider]  atorvastatin (LIPITOR) 20 MG tablet Take 20 mg by mouth daily.   Yes [provider]  carvedilol (COREG) 25 MG tablet Take 25 mg by mouth 2 (two) times daily with a meal.   Yes [provider]  Cholecalciferol (VITAMIN D-3 PO) Take 1 capsule by mouth daily.   Yes [provider]  Cyanocobalamin (VITAMIN B-12 PO) Take 500 mcg by mouth daily.    Yes [provider]  doxazosin (CARDURA) 4 MG tablet Take 4 mg by mouth at bedtime.    Yes [provider]  fluticasone (FLONASE) 50 MCG/ACT nasal spray Place 2 sprays into the nose daily as needed for allergies or rhinitis.    Yes [provider]  FOLIC ACID PO Take 1 tablet by mouth daily.    Yes [provider]  gabapentin (NEURONTIN) 300 MG capsule Take 300 mg by mouth 3 (three) times daily.    Yes [provider]    levothyroxine (SYNTHROID, LEVOTHROID) 50 MCG tablet Take 50 mcg by mouth daily.   Yes [provider]  Liniments (SALONPAS PAIN RELIEF PATCH EX) Apply 1 patch topically daily as needed (pain).   Yes [provider]  losartan-hydrochlorothiazide (HYZAAR) 50-12.5 MG per tablet Take 1 tablet by mouth daily.   Yes [provider]  traMADol (ULTRAM) 50 MG tablet Take 1 tablet (50 mg total) by mouth every 6 (six) hours as needed. Patient taking differently: Take 50 mg by mouth See admin instructions. Take 50 mg by mouth once a day as needed for pain and 50 mg at bedtime scheduled 03/17/18  Yes Daleen Bo, MD  trolamine salicylate (ASPERCREME) 10 % cream Apply 1 application topically as needed (pain).   Yes [provider]  cefdinir (OMNICEF) 300 MG capsule Take 1 capsule (300 mg total) by mouth 2 (two) times daily. Patient not taking: Reported on 04/01/2018 05/17/13   Orpah Greek, MD     Vital Signs: BP (!) 139/56 (BP Location: Right Arm)   Pulse 93   Temp 98.9 F (37.2 C) (Oral)   Resp 20   Ht 5' 2.5" (1.588 m)   Wt 190 lb 0.6 oz (86.2 kg)   SpO2 100%   BMI 34.20 kg/m   Physical Exam  Constitutional:  CXR post chest tube removal yesterday: Stable position of left basilar pigtail catheter with stable moderate left  pleural effusion and associated atelectasis or infiltrate. Status post removal left upper lobe catheter without pneumothorax.  Pulmonary/Chest: Effort normal and breath sounds normal.  Neurological: She is alert.  Skin: Skin is warm.  Site of Left lower chest tube is clean and dry OP serous in Pleurvac  No air leak    Nursing note and vitals reviewed.   Imaging: Ir Catheter Tube Change  Result Date: 04/14/2018 INDICATION: 84 year old female with empyema. The patient has had 2 left-sided thoracostomy tubes placed, the most recent 24 hours ago. This recent tube has become fractured, requiring exchange. EXAM: IMAGE GUIDED  EXCHANGE OF LEFT thoracostomy tube MEDICATIONS: The patient is currently admitted to the hospital and receiving intravenous antibiotics. The antibiotics were administered within an appropriate time frame prior to the initiation of the procedure. ANESTHESIA/SEDATION: Fentanyl 25 mcg IV; Versed 0 mg IV No sedation COMPLICATIONS: None PROCEDURE: Informed written consent was obtained from the patient after a thorough discussion of the procedural risks, benefits and alternatives. All questions were addressed. Maximal Sterile Barrier Technique was utilized including caps, mask, sterile gowns, sterile gloves, sterile drape, hand hygiene and skin antiseptic. A timeout was performed prior to the initiation of the procedure. Patient positioned left decubitus on the IR table. The indwelling drain and the left back were prepped and draped in the usual sterile fashion. Modified Seldinger technique was then used to place a new 12 [place] drain. Drain was sutured in position after 1% lidocaine for local anesthesia. Patient tolerated the procedure well and remained hemodynamically stable throughout. No complications were encountered and no significant blood loss. IMPRESSION: Status post exchange of damaged left thoracostomy tube with placement of a new 12 French drain. Signed, Dulcy Fanny. Earleen Newport, DO [facility] Electronically Signed   By: Corrie Mckusick D.O.   On: 04/14/2018 15:05   Dg Chest Port 1 View  Result Date: 04/16/2018 CLINICAL DATA:  Empyema. EXAM: PORTABLE CHEST 1 VIEW COMPARISON:  Radiograph of Apr 15, 2018. FINDINGS: Stable cardiomegaly. Atherosclerosis of thoracic aorta is noted. No pneumothorax is noted. Left upper lobe catheter has been removed. Left basilar catheter remains. Stable moderate left pleural effusion is noted with associated atelectasis or infiltrate. Minimal right basilar subsegmental atelectasis is noted. Bony thorax is unremarkable.  IMPRESSION: Stable position of left basilar pigtail catheter with stable moderate left pleural effusion and associated atelectasis or infiltrate. Status post removal left upper lobe catheter without pneumothorax. Electronically Signed   By: Marijo Conception, M.D.   On: 04/16/2018 15:16   Dg Chest Port 1 View  Result Date: 04/15/2018 CLINICAL DATA:  Follow-up pleural effusions with chest tube treatment. Empyema, left lower lobe pneumonia. EXAM: PORTABLE CHEST 1 VIEW COMPARISON:  Chest x-ray of Apr 14, 2018 FINDINGS: There remains increased density in the left mid and lower lung with obscuration of the hemidiaphragm. The pleural space drainage catheters are in stable position superiorly and inferiorly. There is patchy increased density at the right lung base which is stable. There is no mediastinal shift. The left heart border is obscured. There is calcification in the wall of the thoracic aorta. There is degenerative disc disease of the thoracic spine. IMPRESSION: Stable appearance of the left-sided empyema or pleural effusion. No pneumothorax. The chest tubes are in stable position. Stable atelectasis or pleural effusion at the right lung base. Electronically Signed   By: David  Martinique M.D.   On: 04/15/2018 07:47   Dg Chest Port 1 View  Result Date: 04/14/2018 CLINICAL  DATA:  Pneumonia EXAM: PORTABLE CHEST 1 VIEW COMPARISON:  Apr 11, 2018 FINDINGS: Mediastinal contour is normal. The heart size is enlarged. Left chest tube in the upper chest is identified unchanged. A second drainage tube is projected over the left lung base. No definite pleural line is noted to suggest pneumothorax. Consolidation of the left mid and lung base as well as right lung base are identified unchanged. There are probable bilateral pleural effusions. The visualized skeletal structures are stable. IMPRESSION: Left chest tube in the upper chest is unchanged. A second drainage tube is projected over the left lung base. There is no  evidence of left pneumothorax. Bilateral pleural effusions, left greater than right unchanged. Consolidation both lung bases left greater than right, unchanged. Electronically Signed   By: Abelardo Diesel M.D.   On: 04/14/2018 07:41   Ct Image Guided Drainage By Percutaneous Catheter  Result Date: 04/13/2018 INDICATION: 84 year old female with a history of empyema. Prior chest tube placement, with persisting loculated empyema. EXAM: CT-GUIDED CHEST DRAIN PLACEMENT MEDICATIONS: The patient is currently admitted to the hospital and receiving intravenous antibiotics. The antibiotics were administered within an appropriate time frame prior to the initiation of the procedure. ANESTHESIA/SEDATION: 1.0 mg IV Versed 50 mcg IV Fentanyl Moderate Sedation Time:  16 minutes The patient was continuously monitored during the procedure by the interventional radiology nurse under my direct supervision. COMPLICATIONS: None TECHNIQUE: Informed written consent was obtained from the patient after a thorough discussion of the procedural risks, benefits and alternatives. All questions were addressed. Maximal Sterile Barrier Technique was utilized including caps, mask, sterile gowns, sterile gloves, sterile drape, hand hygiene and skin antiseptic. A timeout was performed prior to the initiation of the procedure. Patient positioned left decubitus on the CT gantry table. PROCEDURE: The operative field was prepped with chlorhexidine in a sterile fashion, and a sterile drape was applied covering the operative field. A sterile gown and sterile gloves were used for the procedure. Local anesthesia was provided with 1% Lidocaine. After the patient is prepped and draped in the usual sterile fashion, 1% lidocaine was used for local anesthesia. Using CT guidance, trocar needle was advanced into the loculated fluid at the posterior left lung base. Modified Seldinger technique was then used to place a 12 [place] drain. Drain was sutured in position  and attached to water seal chamber. Final CT was performed confirming location. Patient tolerated the procedure well and remained hemodynamically stable throughout. No complications were encountered and no significant blood loss. FINDINGS: CT demonstrates loculated fluid at the left lung base. Twelve French drain was placed into the loculated fluid posterior left lower pleural space. IMPRESSION: Status post CT-guided drain placement into loculated empyema of the left lung base. Signed, Dulcy Fanny. Earleen Newport, DO [facility] Electronically Signed   By: Corrie Mckusick D.O.   On: 04/13/2018 15:13    Labs:  CBC: Recent Labs    04/12/18 0522 04/14/18 0417 04/16/18 0405 04/17/18 0425  WBC 22.2* 17.1* 13.0* 11.0*  HGB 8.4* 8.0* 7.7* 8.2*  HCT 25.5* 25.1* 23.8* 25.9*  PLT 534* 509* 436* 453*    COAGS: Recent Labs    04/01/18 1928  INR 1.36  APTT 39*    BMP: Recent Labs    04/12/18 0522 04/14/18 0417 04/16/18 0405 04/17/18 0425  NA 141 140 137 137  K 3.6 3.6 3.3* 3.3*  CL 103 104 96* 93*  CO2 27 28 31  32  GLUCOSE 77 103* 97 95  BUN 10 11 16 15   CALCIUM 8.1* 8.0* 8.2* 8.3*  CREATININE 1.13* 0.91 0.97 1.03*  GFRNONAA 42* 55* 51* 47*  GFRAA 49* >60 59* 55*    LIVER FUNCTION TESTS: Recent Labs    04/01/18 1928  BILITOT 1.5*  AST 29  ALT 16  ALKPHOS 106  PROT 6.9  ALBUMIN 2.3*    Assessment and Plan:  Left empyema Left lower chest tube in place Plan per Dr Nelda Marseille  Electronically Signed: Monia Sabal A, PA-C 04/17/2018, 9:11 AM   I spent a total of 15 Minutes at the the patient's bedside AND on the patient's hospital floor or unit, greater than 50% of which was counseling/coordinating care for Left empyema chest tube

## 2018-04-17 NOTE — Progress Notes (Addendum)
      [address]       [place] [address]             [phone]           Subjective: Patient states she just had a breathing treatment. She would like more frequent breathing treatments.  Objective: Vital signs in last 24 hours: Temp:  [98.7 F (37.1 C)-99.1 F (37.3 C)] 98.9 F (37.2 C) (05/15 0524) Pulse Rate:  [79-93] 93 (05/15 0524) Cardiac Rhythm: Ventricular tachycardia (05/15 0119) Resp:  [20] 20 (05/15 0524) BP: (109-146)/(50-70) 139/56 (05/15 0524) SpO2:  [99 %-100 %] 100 % (05/15 0524)     Intake/Output from previous day: 05/14 0701 - 05/15 0700 In: 1043.2 [I.V.:693.2; IV Piggyback:350] Out: 440 [Urine:300; Chest Tube:140]   Physical Exam:  Cardiovascular: RRR Pulmonary: Diminished at bases bilaterally Pigtail chest tubes: with light yellow pleural fluid drainage  Lab Results: CBC: Recent Labs    04/16/18 0405 04/17/18 0425  WBC 13.0* 11.0*  HGB 7.7* 8.2*  HCT 23.8* 25.9*  PLT 436* 453*   BMET:  Recent Labs    04/16/18 0405 04/17/18 0425  NA 137 137  K 3.3* 3.3*  CL 96* 93*  CO2 31 32  GLUCOSE 97 95  BUN 16 15  CREATININE 0.97 1.03*  CALCIUM 8.2* 8.3*    PT/INR: No results for input(s): LABPROT, INR in the last 72 hours. ABG:  INR: Will add last result for INR, ABG once components are confirmed Will add last 4 CBG results once components are confirmed  Assessment/Plan:  1. CV - SR in the 80's. On Lopressor 12.5 mg bid 2.  Pulmonary - S/p left pigtail to left lung base. Left pigtail tube with 140 last 24 hours.On 2 liters of oxygen via . On Albuterol Q 4 PRN. 3. Leukocytosis-WBC continues to decrease- 11,000 this am 4. ID-On Vancomycin and Zosyn 5. Anemia-H and H decreased to 8.2 and 25.9.Per medicine 6. On Lasix 40 mg IV daily 7. Management per primary  Donielle M ZimmermanPA-C 04/17/2018,8:17 AM   Patient seen and examined. CXR looks a little better today although some of the difference may be  technique. Hard to tell what may be undrained fluid v consolidated lung. I think it would be worthwhile to check a CT to see there is any collection that would warrant additional thrombolytics.  Revonda Standard Roxan Hockey, MD [facility] [phone]

## 2018-04-17 NOTE — Progress Notes (Signed)
Physical Therapy Treatment Patient Details Name: Monique Lin MRN: [id number] DOB: 1927/05/11 Today's Date: 04/17/2018    History of Present Illness 84 y/o F admitted 4/29 with 2 week hx of progressive SOB.  Seen 4/21 and treated for bronchitis with prednisone + antihistamines without improvement. CXR clear at that time.  Returned 4/29 with generalized weakness, decreased appetite and AMS.  Work up concerning for LLL opacity with associated pleural effusion & hypotension requiring vasopressors.  CXR has had intermittent white out on left despite bronchoscopy and attempts at thoracentesis (limited fluid returned).  Bronchoscopy demonstrated bronchomalacia.  Pt s/p pleural drain x 2 (to chest tube waterseal, continuous suction).     PT Comments    Pt received in recliner. Pt with c/o fatigue due to just returning to recliner from [facility]. Pt agreeable to participation in therapy and requesting return to bed at end of session. Pt ambulated 20 feet with RW and min assist. +2 utilized for safety/lines. Pt with chest tube and on continuous suction during session. She fatigues quickly and requires encouragement to increase activity/mobility. PT continues to recommend SNF. Pt, however, is refusing and family plans to take her home. If pt returns home, she will need 24-hour assist and to maximize [facility] services.    Follow Up Recommendations  SNF;Supervision/Assistance - 24 hour(Pt refusing SNF. Will need to maximize [facility] services if she returns home.)     Equipment Recommendations  Rolling walker with 5" wheels;3in1 (PT)    Recommendations for Other Services       Precautions / Restrictions Precautions Precautions: Fall;Other (comment) Precaution Comments: chest tube    Mobility  Bed Mobility   Bed Mobility: Sit to Supine       Sit to supine: Min assist;HOB elevated   General bed mobility comments: +rail, verbal cues for sequencing, increased time and effort, assist to lift BLE into  bed  Transfers Overall transfer level: Needs assistance Equipment used: Rolling walker (2 wheeled) Transfers: Sit to/from Stand Sit to Stand: Min assist         General transfer comment: assist to power up, increased time to stabilize initial standing balance  Ambulation/Gait Ambulation/Gait assistance: Min assist;+2 safety/equipment Ambulation Distance (Feet): 20 Feet Assistive device: Rolling walker (2 wheeled) Gait Pattern/deviations: Step-through pattern;Decreased stride length;Trunk flexed Gait velocity: decreased Gait velocity interpretation: <1.31 ft/sec, indicative of household ambulator General Gait Details: Pt with c/o feeling her left knee may give way. No buckling noted. Pt fatigues quickly. Pt on 2 L O2 throughout session with SpO2 97%. HR 101   Stairs             Wheelchair Mobility    Modified Rankin (Stroke Patients Only)       Balance   Sitting-balance support: Feet supported;Bilateral upper extremity supported Sitting balance-Leahy Scale: Fair     Standing balance support: Bilateral upper extremity supported;During functional activity Standing balance-Leahy Scale: Poor                              Cognition Arousal/Alertness: Awake/alert Behavior During Therapy: Flat affect Overall Cognitive Status: No family/caregiver present to determine baseline cognitive functioning Area of Impairment: Attention;Following commands;Safety/judgement;Problem solving                   Current Attention Level: Selective   Following Commands: Follows one step commands with increased time Safety/Judgement: Decreased awareness of safety   Problem Solving: Slow processing  Exercises      General Comments        Pertinent Vitals/Pain Pain Assessment: Faces Faces Pain Scale: Hurts little more Pain Location: chest tube site Pain Descriptors / Indicators: Grimacing;Guarding;Discomfort Pain Intervention(s): Monitored during  session;Repositioned    Home Living                      Prior Function            PT Goals (current goals can now be found in the care plan section) Acute Rehab PT Goals Patient Stated Goal: to take it slow and recover PT Goal Formulation: With patient/family Time For Goal Achievement: 04/20/18 Potential to Achieve Goals: Fair Progress towards PT goals: Progressing toward goals    Frequency    Min 3X/week      PT Plan Current plan remains appropriate    Co-evaluation              AM-PAC PT "6 Clicks" Daily Activity  Outcome Measure  Difficulty turning over in bed (including adjusting bedclothes, sheets and blankets)?: Unable Difficulty moving from lying on back to sitting on the side of the bed? : Unable Difficulty sitting down on and standing up from a chair with arms (e.g., wheelchair, bedside commode, etc,.)?: Unable Help needed moving to and from a bed to chair (including a wheelchair)?: A Little Help needed walking in hospital room?: A Little Help needed climbing 3-5 steps with a railing? : A Lot 6 Click Score: 11    End of Session Equipment Utilized During Treatment: Oxygen;Gait belt Activity Tolerance: Patient limited by fatigue Patient left: in bed;with call bell/phone within reach Nurse Communication: Mobility status PT Visit Diagnosis: Unsteadiness on feet (R26.81);History of falling (Z91.81);Muscle weakness (generalized) (M62.81)     Time: 1032-1100 PT Time Calculation (min) (ACUTE ONLY): 28 min  Charges:  $Gait Training: 23-37 mins                    G Codes:       Lorrin Goodell, PT  Office # [phone] Pager [phone]    Lorriane Shire 04/17/2018, 1:02 PM

## 2018-04-17 NOTE — Progress Notes (Signed)
Subjective:  Patient up in chair more alert and awake.  States breathing is improved.  Objective:  Vital Signs in the last 24 hours: Temp:  [98.7 F (37.1 C)-99.1 F (37.3 C)] 98.9 F (37.2 C) (05/15 0524) Pulse Rate:  [79-93] 93 (05/15 0524) Resp:  [20] 20 (05/15 0524) BP: (111-146)/(50-70) 139/56 (05/15 0524) SpO2:  [99 %-100 %] 100 % (05/15 0524)  Intake/Output from previous day: 05/14 0701 - 05/15 0700 In: 1043.2 [I.V.:693.2; IV Piggyback:350] Out: 440 [Urine:300; Chest Tube:140] Intake/Output from this shift: No intake/output data recorded.  Physical Exam: Neck: no adenopathy, no carotid bruit, no JVD and supple, symmetrical, trachea midline Lungs: decreased breath sounds at bases, left more than right, with occasional rhonchi Heart: regular rate and rhythm, S1, S2 normal and soft systolic murmur noted Abdomen: soft, non-tender; bowel sounds normal; no masses,  no organomegaly Extremities: extremities normal, atraumatic, no cyanosis or edema  Lab Results: Recent Labs    04/16/18 0405 04/17/18 0425  WBC 13.0* 11.0*  HGB 7.7* 8.2*  PLT 436* 453*   Recent Labs    04/16/18 0405 04/17/18 0425  NA 137 137  K 3.3* 3.3*  CL 96* 93*  CO2 31 32  GLUCOSE 97 95  BUN 16 15  CREATININE 0.97 1.03*   No results for input(s): TROPONINI in the last 72 hours.  Invalid input(s): CK, MB Hepatic Function Panel No results for input(s): PROT, ALBUMIN, AST, ALT, ALKPHOS, BILITOT, BILIDIR, IBILI in the last 72 hours. No results for input(s): CHOL in the last 72 hours. No results for input(s): PROTIME in the last 72 hours.  Imaging: Imaging results have been reviewed and Dg Chest Port 1 View  Result Date: 04/17/2018 CLINICAL DATA:  Pleural effusion, chest tube EXAM: PORTABLE CHEST 1 VIEW COMPARISON:  Portable exam 0709 hours compared to 04/16/2018 FINDINGS: Pigtail LEFT basilar thoracostomy tube again seen. Minimal enlargement of cardiac silhouette. Atherosclerotic  calcifications aorta. Persistent LEFT pleural effusion and basilar atelectasis. Subsegmental atelectasis at RIGHT base. Upper lungs clear. No pneumothorax or segmental consolidation. Bones demineralized. IMPRESSION: Persistent LEFT basilar pleural effusion and atelectasis. Mild RIGHT basilar atelectasis. Electronically Signed   By: Lavonia Dana M.D.   On: 04/17/2018 09:53   Dg Chest Port 1 View  Result Date: 04/16/2018 CLINICAL DATA:  Empyema. EXAM: PORTABLE CHEST 1 VIEW COMPARISON:  Radiograph of Apr 15, 2018. FINDINGS: Stable cardiomegaly. Atherosclerosis of thoracic aorta is noted. No pneumothorax is noted. Left upper lobe catheter has been removed. Left basilar catheter remains. Stable moderate left pleural effusion is noted with associated atelectasis or infiltrate. Minimal right basilar subsegmental atelectasis is noted. Bony thorax is unremarkable. IMPRESSION: Stable position of left basilar pigtail catheter with stable moderate left pleural effusion and associated atelectasis or infiltrate. Status post removal left upper lobe catheter without pneumothorax. Electronically Signed   By: Marijo Conception, M.D.   On: 04/16/2018 15:16    Cardiac Studies:  Assessment/Plan:   Resolving Left lung pneumoniacomplicated by empyemaStatus post bronchoscopynoted to have bronchomalacia. Status post CT-guided drain left pleural spacedrainage status post removal of upper chest tube Possible pleuropericarditis Status post septic shock. 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 Plan Replace K Incentive spirometry. Ambulate as tolerated   LOS: 16 days    [name] 04/17/2018, 1:08 PM

## 2018-04-18 LAB — MAGNESIUM: Magnesium: 1.2 mg/dL — ABNORMAL LOW (ref 1.7–2.4)

## 2018-04-18 LAB — CULTURE, BLOOD (ROUTINE X 2)
CULTURE: NO GROWTH
Culture: NO GROWTH
Special Requests: ADEQUATE
Special Requests: ADEQUATE

## 2018-04-18 MED ORDER — AMPICILLIN-SULBACTAM SODIUM 3 (2-1) G IJ SOLR
3.0000 g | Freq: Four times a day (QID) | INTRAMUSCULAR | Status: DC
  Administered 2018-04-18 – 2018-04-23 (×19): 3 g via INTRAVENOUS
  Filled 2018-04-18 (×20): qty 3

## 2018-04-18 MED ORDER — SODIUM CHLORIDE 0.9 % IJ SOLN
10.0000 mg | Freq: Two times a day (BID) | INTRAMUSCULAR | Status: AC
  Administered 2018-04-18: 10 mg via INTRAPLEURAL
  Filled 2018-04-18: qty 10

## 2018-04-18 MED ORDER — STERILE WATER FOR INJECTION IJ SOLN
5.0000 mg | Freq: Two times a day (BID) | RESPIRATORY_TRACT | Status: AC
  Filled 2018-04-18: qty 5

## 2018-04-18 NOTE — Progress Notes (Signed)
  Subjective: Feels a little better  Objective: Vital signs in last 24 hours: Temp:  [98.5 F (36.9 C)-99.5 F (37.5 C)] 98.5 F (36.9 C) (05/16 1250) Pulse Rate:  [89-94] 92 (05/16 1250) Cardiac Rhythm: Normal sinus rhythm (05/16 0700) Resp:  [14-20] 20 (05/16 1250) BP: (96-134)/(49-58) 96/58 (05/16 1250) SpO2:  [97 %-100 %] 97 % (05/16 1250)  Hemodynamic parameters for last 24 hours:    Intake/Output from previous day: 05/15 0701 - 05/16 0700 In: 690 [P.O.:290; IV Piggyback:400] Out: -  Intake/Output this shift: No intake/output data recorded.  Lungs: diminished breath sounds left base  Lab Results: Recent Labs    04/16/18 0405 04/17/18 0425  WBC 13.0* 11.0*  HGB 7.7* 8.2*  HCT 23.8* 25.9*  PLT 436* 453*   BMET:  Recent Labs    04/16/18 0405 04/17/18 0425  NA 137 137  K 3.3* 3.3*  CL 96* 93*  CO2 31 32  GLUCOSE 97 95  BUN 16 15  CREATININE 0.97 1.03*  CALCIUM 8.2* 8.3*    PT/INR: No results for input(s): LABPROT, INR in the last 72 hours. ABG    Component Value Date/Time   PHART 7.472 (H) 04/02/2018 0158   HCO3 17.5 (L) 04/02/2018 0158   TCO2 18 (L) 04/02/2018 0158   ACIDBASEDEF 5.0 (H) 04/02/2018 0158   O2SAT 100.0 04/02/2018 0158   CBG (last 3)  No results for input(s): GLUCAP in the last 72 hours.  Assessment/Plan: S/P  -  Minimal drainage from CT. CT showed some residual loculated fluid at base in relatively close proximity to drainage tube. I think it would be worth trying thrombolytics through the tube to see if we can get any of this to drain.  Discussed with patient and her daughter   LOS: 17 days    Melrose Nakayama 04/18/2018

## 2018-04-18 NOTE — Progress Notes (Signed)
Subjective:  Patient denies any chest pain or shortness of breath. Slowly improving clinically  Objective:  Vital Signs in the last 24 hours: Temp:  [99.1 F (37.3 C)-99.5 F (37.5 C)] 99.5 F (37.5 C) (05/16 0531) Pulse Rate:  [89-94] 92 (05/16 0531) Resp:  [14] 14 (05/16 0531) BP: (107-134)/(49-56) 134/56 (05/16 0531) SpO2:  [98 %-100 %] 100 % (05/16 0531)  Intake/Output from previous day: 05/15 0701 - 05/16 0700 In: 690 [P.O.:290; IV Piggyback:400] Out: -  Intake/Output from this shift: No intake/output data recorded.  Physical Exam: Neck: no adenopathy, no carotid bruit, no JVD and supple, symmetrical, trachea midline Lungs: Decreased breath sound at bases left more than right with occasional rhonchi air entry has improved Heart: regular rate and rhythm, S1, S2 normal and Soft systolic murmur noted no pericardial rub Abdomen: soft, non-tender; bowel sounds normal; no masses,  no organomegaly Extremities: extremities normal, atraumatic, no cyanosis or edema  Lab Results: Recent Labs    04/16/18 0405 04/17/18 0425  WBC 13.0* 11.0*  HGB 7.7* 8.2*  PLT 436* 453*   Recent Labs    04/16/18 0405 04/17/18 0425  NA 137 137  K 3.3* 3.3*  CL 96* 93*  CO2 31 32  GLUCOSE 97 95  BUN 16 15  CREATININE 0.97 1.03*   No results for input(s): TROPONINI in the last 72 hours.  Invalid input(s): CK, MB Hepatic Function Panel No results for input(s): PROT, ALBUMIN, AST, ALT, ALKPHOS, BILITOT, BILIDIR, IBILI in the last 72 hours. No results for input(s): CHOL in the last 72 hours. No results for input(s): PROTIME in the last 72 hours.  Imaging: Imaging results have been reviewed and Dg Chest Port 1 View  Result Date: 04/17/2018 CLINICAL DATA:  Pleural effusion, chest tube EXAM: PORTABLE CHEST 1 VIEW COMPARISON:  Portable exam 0709 hours compared to 04/16/2018 FINDINGS: Pigtail LEFT basilar thoracostomy tube again seen. Minimal enlargement of cardiac silhouette. Atherosclerotic  calcifications aorta. Persistent LEFT pleural effusion and basilar atelectasis. Subsegmental atelectasis at RIGHT base. Upper lungs clear. No pneumothorax or segmental consolidation. Bones demineralized. IMPRESSION: Persistent LEFT basilar pleural effusion and atelectasis. Mild RIGHT basilar atelectasis. Electronically Signed   By: Lavonia Dana M.D.   On: 04/17/2018 09:53   Dg Chest Port 1 View  Result Date: 04/16/2018 CLINICAL DATA:  Empyema. EXAM: PORTABLE CHEST 1 VIEW COMPARISON:  Radiograph of Apr 15, 2018. FINDINGS: Stable cardiomegaly. Atherosclerosis of thoracic aorta is noted. No pneumothorax is noted. Left upper lobe catheter has been removed. Left basilar catheter remains. Stable moderate left pleural effusion is noted with associated atelectasis or infiltrate. Minimal right basilar subsegmental atelectasis is noted. Bony thorax is unremarkable. IMPRESSION: Stable position of left basilar pigtail catheter with stable moderate left pleural effusion and associated atelectasis or infiltrate. Status post removal left upper lobe catheter without pneumothorax. Electronically Signed   By: Marijo Conception, M.D.   On: 04/16/2018 15:16    Cardiac Studies:  Assessment/Plan:  Resolving Left lung pneumoniacomplicated by empyemaStatus post bronchoscopynoted to have bronchomalacia. Status post CT-guided drain left pleural spacedrainage status post removal of upper chest tube Possible pleuropericarditis Status post septic shock. 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 Plan Continue present management Check CT results Awaiting CVTS recommendations CHECK LABS IN A.M.  LOS: 17 days  [name] 04/18/2018, 10:40 AM

## 2018-04-19 ENCOUNTER — Inpatient Hospital Stay (HOSPITAL_COMMUNITY): Payer: Medicare Other

## 2018-04-19 LAB — BASIC METABOLIC PANEL
Anion gap: 11 (ref 5–15)
BUN: 16 mg/dL (ref 6–20)
CALCIUM: 8.5 mg/dL — AB (ref 8.9–10.3)
CO2: 30 mmol/L (ref 22–32)
CREATININE: 0.88 mg/dL (ref 0.44–1.00)
Chloride: 100 mmol/L — ABNORMAL LOW (ref 101–111)
GFR calc Af Amer: >60 mL/min (ref >60)
GFR calc non Af Amer: 57 mL/min — ABNORMAL LOW (ref >60)
GLUCOSE: 105 mg/dL — AB (ref 65–99)
Potassium: 3.6 mmol/L (ref 3.5–5.1)
Sodium: 141 mmol/L (ref 135–145)

## 2018-04-19 LAB — CBC
HCT: 25.2 % — ABNORMAL LOW (ref 36.0–46.0)
Hemoglobin: 8 g/dL — ABNORMAL LOW (ref 12.0–15.0)
MCH: 28.6 pg (ref 26.0–34.0)
MCHC: 31.7 g/dL (ref 30.0–36.0)
MCV: 90 fL (ref 78.0–100.0)
Platelets: 413 10*3/uL — ABNORMAL HIGH (ref 150–400)
RBC: 2.8 MIL/uL — ABNORMAL LOW (ref 3.87–5.11)
RDW: 14.5 % (ref 11.5–15.5)
WBC: 7.8 10*3/uL (ref 4.0–10.5)

## 2018-04-19 MED ORDER — DORNASE ALFA 2.5 MG/2.5ML IN SOLN
5.0000 mg | Freq: Two times a day (BID) | RESPIRATORY_TRACT | Status: AC
  Administered 2018-04-19: 5 mg via INTRAPLEURAL
  Filled 2018-04-19: qty 5

## 2018-04-19 MED ORDER — MAGNESIUM SULFATE 4 GM/100ML IV SOLN
4.0000 g | Freq: Once | INTRAVENOUS | Status: AC
  Administered 2018-04-19: 4 g via INTRAVENOUS
  Filled 2018-04-19: qty 100

## 2018-04-19 MED ORDER — SODIUM CHLORIDE 0.9 % IJ SOLN
10.0000 mg | Freq: Two times a day (BID) | INTRAMUSCULAR | Status: AC
  Administered 2018-04-19: 10 mg via INTRAPLEURAL
  Filled 2018-04-19: qty 10

## 2018-04-19 NOTE — Progress Notes (Signed)
Subjective:  Appreciate all consultants help.  Patient denies any chest pain.  States breathing has improved.appetite slowly improving  Objective:  Vital Signs in the last 24 hours: Temp:  [98.5 F (36.9 C)-99.3 F (37.4 C)] 99.3 F (37.4 C) (05/16 2124) Pulse Rate:  [92-93] 93 (05/16 2124) Resp:  [16-20] 16 (05/16 2124) BP: (96-138)/(58-60) 138/60 (05/16 2124) SpO2:  [97 %-100 %] 100 % (05/16 2124) Weight:  [81.5 kg (179 lb 10.8 oz)] 81.5 kg (179 lb 10.8 oz) (05/17 0500)  Intake/Output from previous day: 05/16 0701 - 05/17 0700 In: 1377.2 [P.O.:837; I.V.:230.2; IV Piggyback:300] Out: 20 [Chest Tube:20] Intake/Output from this shift: No intake/output data recorded.  Physical Exam: Neck: no adenopathy, no carotid bruit, no JVD and supple, symmetrical, trachea midline Lungs: decreased breath sounds at bases with occasional left base rhonchi Heart: regular rate and rhythm, S1, S2 normal and soft systolic murmur noted Abdomen: soft, non-tender; bowel sounds normal; no masses,  no organomegaly Extremities: extremities normal, atraumatic, no cyanosis or edema  Lab Results: Recent Labs    04/17/18 0425 04/19/18 0441  WBC 11.0* 7.8  HGB 8.2* 8.0*  PLT 453* 413*   Recent Labs    04/17/18 0425 04/19/18 0441  NA 137 141  K 3.3* 3.6  CL 93* 100*  CO2 32 30  GLUCOSE 95 105*  BUN 15 16  CREATININE 1.03* 0.88   No results for input(s): TROPONINI in the last 72 hours.  Invalid input(s): CK, MB Hepatic Function Panel No results for input(s): PROT, ALBUMIN, AST, ALT, ALKPHOS, BILITOT, BILIDIR, IBILI in the last 72 hours. No results for input(s): CHOL in the last 72 hours. No results for input(s): PROTIME in the last 72 hours.  Imaging: Imaging results have been reviewed and Ct Chest Wo Contrast  Result Date: 04/18/2018 CLINICAL DATA:  Follow-up left empyema. EXAM: CT CHEST WITHOUT CONTRAST TECHNIQUE: Multidetector CT imaging of the chest was performed following the standard  protocol without IV contrast. COMPARISON:  04/12/2018 FINDINGS: Cardiovascular: No acute findings. Aortic and coronary artery atherosclerosis. Mediastinum/Nodes: No masses or pathologically enlarged lymph nodes identified on this unenhanced exam. Lungs/Pleura: There has been interval placement of a pigtail catheter in the left posterior pleural space. Multiloculated left pleural effusion has decreased in size since previous study. No evidence of pneumothorax. Mild decrease in compressive atelectasis in the lingula and left lower lobe since prior study. Small right pleural effusion is also decreased in size since previous study, with decreased atelectasis in right lower lobe. Upper Abdomen:  Unremarkable. Musculoskeletal:  No suspicious bone lesions. IMPRESSION: Interval decrease in multiloculated left pleural effusion and compressive atelectasis. Decreased size of small right pleural effusion and right lower lobe atelectasis. Aortic Atherosclerosis (ICD10-I70.0). Coronary artery calcification. Electronically Signed   By: Earle Gell M.D.   On: 04/18/2018 11:11   Dg Chest Port 1 View  Result Date: 04/19/2018 CLINICAL DATA:  Chest 2.  Follow-up exam. EXAM: PORTABLE CHEST 1 VIEW COMPARISON:  04/17/2018 and older studies. FINDINGS: Left inferior hemithorax chest tube is unchanged from prior exam. There is persistent lung base opacity which obscures hemidiaphragms, consistent with combination of pleural fluid and atelectasis and/or pneumonia. No new lung abnormalities. No pneumothorax. IMPRESSION: 1. No significant change from the previous exam. 2. Stable left chest tube. Stable pleural effusions and lung base opacity, most likely atelectasis. 3. No pneumothorax. Electronically Signed   By: Lajean Manes M.D.   On: 04/19/2018 09:46    Cardiac Studies:  Assessment/Plan:  ResolvingLeft lung pneumoniacomplicated  by empyemaStatus post bronchoscopynoted to have bronchomalacia. Status post CT-guided drain left  pleural spacedrainagestatus post removal of upper chest tube Possible pleuropericarditis Status post septic shock. 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 Hypomagnesemia. Plan Replace magnesium. Continue present management. Encouraged incentive spirometry   LOS: 18 days    [name] 04/19/2018, 10:24 AM

## 2018-04-19 NOTE — Plan of Care (Signed)
Patient able to transfer from chair to bed, chair to [facility] safely using front wheel walker with coaching/supervision with very minimal assist.

## 2018-04-19 NOTE — Progress Notes (Addendum)
      [address]       [place] [address]             [phone]      Subjective:  States she is cold.  Doesn't think she is coughing as much since installation of thrombolytics placed in chest tube yesterday.  Objective: Vital signs in last 24 hours: Temp:  [98.5 F (36.9 C)-99.3 F (37.4 C)] 99.3 F (37.4 C) (05/16 2124) Pulse Rate:  [92-93] 93 (05/16 2124) Cardiac Rhythm: Normal sinus rhythm (05/17 0843) Resp:  [16-20] 16 (05/16 2124) BP: (96-138)/(58-60) 138/60 (05/16 2124) SpO2:  [97 %-100 %] 100 % (05/16 2124) Weight:  [179 lb 10.8 oz (81.5 kg)] 179 lb 10.8 oz (81.5 kg) (05/17 0500)  Intake/Output from previous day: 05/16 0701 - 05/17 0700 In: 1377.2 [P.O.:837; I.V.:230.2; IV Piggyback:300] Out: 20 [Chest Tube:20]  General appearance: alert, cooperative and no distress Heart: regular rate and rhythm Lungs: diminished breath sounds bibasilar Wound: clean and dry  Lab Results: Recent Labs    04/17/18 0425 04/19/18 0441  WBC 11.0* 7.8  HGB 8.2* 8.0*  HCT 25.9* 25.2*  PLT 453* 413*   BMET:  Recent Labs    04/17/18 0425 04/19/18 0441  NA 137 141  K 3.3* 3.6  CL 93* 100*  CO2 32 30  GLUCOSE 95 105*  BUN 15 16  CREATININE 1.03* 0.88  CALCIUM 8.3* 8.5*    PT/INR: No results for input(s): LABPROT, INR in the last 72 hours. ABG    Component Value Date/Time   PHART 7.472 (H) 04/02/2018 0158   HCO3 17.5 (L) 04/02/2018 0158   TCO2 18 (L) 04/02/2018 0158   ACIDBASEDEF 5.0 (H) 04/02/2018 0158   O2SAT 100.0 04/02/2018 0158   CBG (last 3)  No results for input(s): GLUCAP in the last 72 hours.  Assessment/Plan:  1. Residual Loculated Pleural Effusion- Thrombolytics instilled yesterday, she feels she isn't coughing as much 2. Pulm- CXR shows some improvement 3. Dispo- patient stable, will repeat Thrombolytics today, can possibly remove chest tube tomorrow, care per primary   LOS: 18 days    Erin Barrett 04/19/2018 Minimal efect  form thrombolytics yesterday. Will try again today If no significant increase in output would pull tube over the weekend  [place]. Roxan Hockey, MD [facility] [phone]

## 2018-04-19 NOTE — Progress Notes (Signed)
Referring Physician(s): Dr. Roxan Hockey  Supervising Physician: Arne Cleveland  Patient Status:  [facility] - In-pt  Chief Complaint: Empyema  Subjective: Resting comfortably in bed.  States she has been getting up to chair with assistance.  No complaints.   Allergies: Codeine and Penicillins  Medications: Prior to Admission medications   Medication Sig Start Date End Date Taking? Authorizing Provider  albuterol (PROVENTIL HFA;VENTOLIN HFA) 108 (90 BASE) MCG/ACT inhaler Inhale 2 puffs into the lungs every 6 (six) hours as needed for wheezing or shortness of breath.    Yes [provider]  albuterol (PROVENTIL) (2.5 MG/3ML) 0.083% nebulizer solution Take 2.5 mg by nebulization every 6 (six) hours as needed for wheezing.   Yes [provider]  amLODipine (NORVASC) 5 MG tablet Take 5 mg by mouth daily.   Yes [provider]  aspirin EC 81 MG tablet Take 81 mg by mouth daily.   Yes [provider]  atorvastatin (LIPITOR) 20 MG tablet Take 20 mg by mouth daily.   Yes [provider]  carvedilol (COREG) 25 MG tablet Take 25 mg by mouth 2 (two) times daily with a meal.   Yes [provider]  Cholecalciferol (VITAMIN D-3 PO) Take 1 capsule by mouth daily.   Yes [provider]  Cyanocobalamin (VITAMIN B-12 PO) Take 500 mcg by mouth daily.    Yes [provider]  doxazosin (CARDURA) 4 MG tablet Take 4 mg by mouth at bedtime.    Yes [provider]  fluticasone (FLONASE) 50 MCG/ACT nasal spray Place 2 sprays into the nose daily as needed for allergies or rhinitis.    Yes [provider]  FOLIC ACID PO Take 1 tablet by mouth daily.    Yes [provider]  gabapentin (NEURONTIN) 300 MG capsule Take 300 mg by mouth 3 (three) times daily.    Yes [provider]  levothyroxine (SYNTHROID, LEVOTHROID) 50 MCG tablet Take 50 mcg by mouth daily.   Yes [provider]  Liniments (SALONPAS  PAIN RELIEF PATCH EX) Apply 1 patch topically daily as needed (pain).   Yes [provider]  losartan-hydrochlorothiazide (HYZAAR) 50-12.5 MG per tablet Take 1 tablet by mouth daily.   Yes [provider]  traMADol (ULTRAM) 50 MG tablet Take 1 tablet (50 mg total) by mouth every 6 (six) hours as needed. Patient taking differently: Take 50 mg by mouth See admin instructions. Take 50 mg by mouth once a day as needed for pain and 50 mg at bedtime scheduled 03/17/18  Yes Daleen Bo, MD  trolamine salicylate (ASPERCREME) 10 % cream Apply 1 application topically as needed (pain).   Yes [provider]  cefdinir (OMNICEF) 300 MG capsule Take 1 capsule (300 mg total) by mouth 2 (two) times daily. Patient not taking: Reported on 04/01/2018 05/17/13   Orpah Greek, MD     Vital Signs: BP 117/61 (BP Location: Left Arm)   Pulse 91   Temp 98.9 F (37.2 C) (Oral)   Resp 20   Ht 5' 2.5" (1.588 m)   Wt 179 lb 10.8 oz (81.5 kg)   SpO2 99%   BMI 32.34 kg/m   Physical Exam  Constitutional: She is oriented to person, place, and time. She appears well-developed.  Cardiovascular: Normal rate, regular rhythm and normal heart sounds.  Pulmonary/Chest: No respiratory distress.  Diminished breath sounds on left.  Chest tube in place. Minimal output.   Neurological: She is alert and oriented to person,  place, and time.  Nursing note and vitals reviewed.   Imaging: Ct Chest Wo Contrast  Result Date: 04/18/2018 CLINICAL DATA:  Follow-up left empyema. EXAM: CT CHEST WITHOUT CONTRAST TECHNIQUE: Multidetector CT imaging of the chest was performed following the standard protocol without IV contrast. COMPARISON:  04/12/2018 FINDINGS: Cardiovascular: No acute findings. Aortic and coronary artery atherosclerosis. Mediastinum/Nodes: No masses or pathologically enlarged lymph nodes identified on this unenhanced exam. Lungs/Pleura: There has been interval placement of a pigtail  catheter in the left posterior pleural space. Multiloculated left pleural effusion has decreased in size since previous study. No evidence of pneumothorax. Mild decrease in compressive atelectasis in the lingula and left lower lobe since prior study. Small right pleural effusion is also decreased in size since previous study, with decreased atelectasis in right lower lobe. Upper Abdomen:  Unremarkable. Musculoskeletal:  No suspicious bone lesions. IMPRESSION: Interval decrease in multiloculated left pleural effusion and compressive atelectasis. Decreased size of small right pleural effusion and right lower lobe atelectasis. Aortic Atherosclerosis (ICD10-I70.0). Coronary artery calcification. Electronically Signed   By: Earle Gell M.D.   On: 04/18/2018 11:11   Dg Chest Port 1 View  Result Date: 04/19/2018 CLINICAL DATA:  Chest 2.  Follow-up exam. EXAM: PORTABLE CHEST 1 VIEW COMPARISON:  04/17/2018 and older studies. FINDINGS: Left inferior hemithorax chest tube is unchanged from prior exam. There is persistent lung base opacity which obscures hemidiaphragms, consistent with combination of pleural fluid and atelectasis and/or pneumonia. No new lung abnormalities. No pneumothorax. IMPRESSION: 1. No significant change from the previous exam. 2. Stable left chest tube. Stable pleural effusions and lung base opacity, most likely atelectasis. 3. No pneumothorax. Electronically Signed   By: Lajean Manes M.D.   On: 04/19/2018 09:46   Dg Chest Port 1 View  Result Date: 04/17/2018 CLINICAL DATA:  Pleural effusion, chest tube EXAM: PORTABLE CHEST 1 VIEW COMPARISON:  Portable exam 0709 hours compared to 04/16/2018 FINDINGS: Pigtail LEFT basilar thoracostomy tube again seen. Minimal enlargement of cardiac silhouette. Atherosclerotic calcifications aorta. Persistent LEFT pleural effusion and basilar atelectasis. Subsegmental atelectasis at RIGHT base. Upper lungs clear. No pneumothorax or segmental consolidation. Bones  demineralized. IMPRESSION: Persistent LEFT basilar pleural effusion and atelectasis. Mild RIGHT basilar atelectasis. Electronically Signed   By: Lavonia Dana M.D.   On: 04/17/2018 09:53   Dg Chest Port 1 View  Result Date: 04/16/2018 CLINICAL DATA:  Empyema. EXAM: PORTABLE CHEST 1 VIEW COMPARISON:  Radiograph of Apr 15, 2018. FINDINGS: Stable cardiomegaly. Atherosclerosis of thoracic aorta is noted. No pneumothorax is noted. Left upper lobe catheter has been removed. Left basilar catheter remains. Stable moderate left pleural effusion is noted with associated atelectasis or infiltrate. Minimal right basilar subsegmental atelectasis is noted. Bony thorax is unremarkable. IMPRESSION: Stable position of left basilar pigtail catheter with stable moderate left pleural effusion and associated atelectasis or infiltrate. Status post removal left upper lobe catheter without pneumothorax. Electronically Signed   By: Marijo Conception, M.D.   On: 04/16/2018 15:16    Labs:  CBC: Recent Labs    04/14/18 0417 04/16/18 0405 04/17/18 0425 04/19/18 0441  WBC 17.1* 13.0* 11.0* 7.8  HGB 8.0* 7.7* 8.2* 8.0*  HCT 25.1* 23.8* 25.9* 25.2*  PLT 509* 436* 453* 413*    COAGS: Recent Labs    04/01/18 1928  INR 1.36  APTT 39*    BMP: Recent Labs    04/14/18 0417 04/16/18 0405 04/17/18 0425 04/19/18 0441  NA 140 137 137 141  K 3.6 3.3*  3.3* 3.6  CL 104 96* 93* 100*  CO2 28 31 32 30  GLUCOSE 103* 97 95 105*  BUN 11 16 15 16   CALCIUM 8.0* 8.2* 8.3* 8.5*  CREATININE 0.91 0.97 1.03* 0.88  GFRNONAA 55* 51* 47* 57*  GFRAA >60 59* 55* >60    LIVER FUNCTION TESTS: Recent Labs    04/01/18 1928  BILITOT 1.5*  AST 29  ALT 16  ALKPHOS 106  PROT 6.9  ALBUMIN 2.3*    Assessment and Plan: Empyema Patient s/p thrombolytics by TCTS yesterday.  CXR shows: 1. No significant change from the previous exam. 2. Stable left chest tube. Stable pleural effusions and lung base opacity, most likely  atelectasis. 3. No pneumothorax.  Second round of thrombolytics instilled by TCTS again today.  Note considering removal over the weekend if no change.  IR following.   Electronically Signed: Docia Barrier, PA 04/19/2018, 3:31 PM   I spent a total of 15 Minutes at the the patient's bedside AND on the patient's hospital floor or unit, greater than 50% of which was counseling/coordinating care for empyema.

## 2018-04-19 NOTE — Social Work (Signed)
CSW continuing to follow for disposition as pt medically improves.  [name], [place] Work [phone]

## 2018-04-19 NOTE — Progress Notes (Signed)
Occupational Therapy Treatment Patient Details Name: Monique Lin MRN: [id number] DOB: [birth year] Today's Date: 04/19/2018    History of present illness 84 y/o F admitted 4/29 with 2 week hx of progressive SOB.  Seen 4/21 and treated for bronchitis with prednisone + antihistamines without improvement. CXR clear at that time.  Returned 4/29 with generalized weakness, decreased appetite and AMS.  Work up concerning for LLL opacity with associated pleural effusion & hypotension requiring vasopressors.  CXR has had intermittent white out on left despite bronchoscopy and attempts at thoracentesis (limited fluid returned).  Bronchoscopy demonstrated bronchomalacia.  Pt s/p pleural drain x 2 (to chest tube waterseal, continuous suction).    OT comments  Pt making progress with functional goals, however she  fatigues quickly and continues to require encouragement to participate in activity/mobility. OT continues to recommend SNF. Pt/family, however, is refusing and family plans to take her home. If pt returns home, she will need 24-hour assist and to maximize [facility] services. OT will continue to  follow acutely  Follow Up Recommendations  SNF;Supervision/Assistance - 24 hour(family refusing SNF)    Equipment Recommendations  3 in 1 bedside commode    Recommendations for Other Services      Precautions / Restrictions Precautions Precautions: Fall;Other (comment) Precaution Comments: chest tube Restrictions Weight Bearing Restrictions: No       Mobility Bed Mobility               General bed mobility comments: pt up in recliner upon arrival  Transfers Overall transfer level: Needs assistance Equipment used: Rolling walker (2 wheeled) Transfers: Sit to/from Stand Sit to Stand: Min assist         General transfer comment: assist to power up, increased time to stabilize initial standing balance    Balance Overall balance assessment: Needs assistance Sitting-balance support: Feet  supported;Bilateral upper extremity supported Sitting balance-Leahy Scale: Fair     Standing balance support: Bilateral upper extremity supported;During functional activity Standing balance-Leahy Scale: Normal Standing balance comment: pt stood at [facility] for clothing mgt and hygiene with mod A for balance/support this session, cues to stand upright                           ADL either performed or assessed with clinical judgement   ADL Overall ADL's : Needs assistance/impaired     Grooming: Sitting;Wash/dry hands;Wash/dry face;Minimal assistance   Upper Body Bathing: Minimal assistance;Sitting Upper Body Bathing Details (indicate cue type and reason): simulated Lower Body Bathing: Maximal assistance;Sitting/lateral leans;Sit to/from stand;Moderate assistance           Toilet Transfer: Minimal assistance;Cueing for sequencing;Cueing for safety;Grab bars;Ambulation;RW;BSC   Toileting- Clothing Manipulation and Hygiene: Moderate assistance Toileting - Clothing Manipulation Details (indicate cue type and reason): pt stood at [facility] for clothing mgt and hygiene with mod A for balance/support this session, cues for posture     Functional mobility during ADLs: Minimal assistance;Rolling walker;Cueing for safety;Cueing for sequencing General ADL Comments: pt required max encouragement to participate in toileting on [facility] and bathing/dressing     Vision Patient Visual Report: No change from baseline     Perception     Praxis      Cognition Arousal/Alertness: Awake/alert Behavior During Therapy: Flat affect Overall Cognitive Status: Impaired/Different from baseline Area of Impairment: Attention;Following commands;Safety/judgement;Problem solving                       Following Commands: Follows  one step commands with increased time Safety/Judgement: Decreased awareness of safety   Problem Solving: Slow processing General Comments: Pt is very HOH making some  communication difficult.         Exercises     Shoulder Instructions       General Comments      Pertinent Vitals/ Pain       Pain Assessment: Faces Faces Pain Scale: Hurts little more Pain Location: chest tube site Pain Descriptors / Indicators: Grimacing;Guarding;Discomfort Pain Intervention(s): Monitored during session;Repositioned  Home Living                                          Prior Functioning/Environment              Frequency  Min 2X/week        Progress Toward Goals  OT Goals(current goals can now be found in the care plan section)  Progress towards OT goals: Progressing toward goals     Plan Discharge plan remains appropriate    Co-evaluation                 AM-PAC PT "6 Clicks" Daily Activity     Outcome Measure   Help from another person eating meals?: A Little Help from another person taking care of personal grooming?: A Little Help from another person toileting, which includes using toliet, bedpan, or urinal?: A Lot Help from another person bathing (including washing, rinsing, drying)?: A Lot Help from another person to put on and taking off regular upper body clothing?: A Little Help from another person to put on and taking off regular lower body clothing?: Total 6 Click Score: 14    End of Session Equipment Utilized During Treatment: Rolling walker;Gait belt;Other (comment)(BSC)  OT Visit Diagnosis: Other abnormalities of gait and mobility (R26.89);Pain;Muscle weakness (generalized) (M62.81) Pain - part of body: (chest tube site)   Activity Tolerance Patient limited by fatigue   Patient Left with call bell/phone within reach;in chair;with chair alarm set   Nurse Communication      Functional Assessment Tool Used: AM-PAC 6 Clicks Daily Activity   Time: 9381-8299 OT Time Calculation (min): 25 min  Charges: OT G-codes **NOT FOR INPATIENT CLASS** Functional Assessment Tool Used: AM-PAC 6 Clicks  Daily Activity OT General Charges $OT Visit: 1 Visit OT Treatments $Self Care/Home Management : 8-22 mins $Therapeutic Activity: 8-22 mins    Britt Bottom 04/19/2018, 2:25 PM

## 2018-04-19 NOTE — Progress Notes (Signed)
Physical Therapy Treatment Patient Details Name: Monique Lin MRN: [id number] DOB: January 23, 1927 Today's Date: 04/19/2018    History of Present Illness 84 y/o F admitted 4/29 with 2 week hx of progressive SOB.  Seen 4/21 and treated for bronchitis with prednisone + antihistamines without improvement. CXR clear at that time.  Returned 4/29 with generalized weakness, decreased appetite and AMS.  Work up concerning for LLL opacity with associated pleural effusion & hypotension requiring vasopressors.  CXR has had intermittent white out on left despite bronchoscopy and attempts at thoracentesis (limited fluid returned).  Bronchoscopy demonstrated bronchomalacia.  Pt s/p pleural drain x 2 (to chest tube waterseal, continuous suction).     PT Comments    Goals updated.  Pt needed less overall assist with bed mobility today, however, she continues to struggle with tolerance of any significant gait distance.  Once CT is off of continuous suction (and/or removed) we will hopefully be able to progress gait to the hallway with chair to follow.  Even in the room pt can only go about 15' before stopping and resting.  Per notes she continues to want to go home with family, and I agree with OT, she needs to have 24/7 physical assist at home.     Follow Up Recommendations  SNF;Supervision/Assistance - 24 hour     Equipment Recommendations  Rolling walker with 5" wheels;3in1 (PT)    Recommendations for Other Services   NA     Precautions / Restrictions Precautions Precautions: Fall;Other (comment) Precaution Comments: chest tube Restrictions Weight Bearing Restrictions: No    Mobility  Bed Mobility Overal bed mobility: Needs Assistance Bed Mobility: Supine to Sit     Supine to sit: Min assist;HOB elevated     General bed mobility comments: Min assist to help progress legs to EOB and separately support trunk when coming to sitting.  Pt using bed rail for leverage.   Transfers Overall transfer  level: Needs assistance Equipment used: Rolling walker (2 wheeled) Transfers: Sit to/from Stand Sit to Stand: Min assist         General transfer comment: Min assist to power up to stand multiple times.    Ambulation/Gait Ambulation/Gait assistance: Min assist Ambulation Distance (Feet): 25 Feet(with one seated rest break at ~15') Assistive device: Rolling walker (2 wheeled) Gait Pattern/deviations: Step-through pattern;Decreased stride length;Trunk flexed             Balance Overall balance assessment: Needs assistance Sitting-balance support: Feet supported;No upper extremity supported Sitting balance-Leahy Scale: Fair Sitting balance - Comments: flexed trunk and pelvis in sitting   Standing balance support: Bilateral upper extremity supported;During functional activity Standing balance-Leahy Scale: Poor Standing balance comment: needs external assist in standing.                            Cognition Arousal/Alertness: Awake/alert Behavior During Therapy: Flat affect Overall Cognitive Status: Impaired/Different from baseline Area of Impairment: Attention;Following commands;Safety/judgement;Problem solving                       Following Commands: Follows one step commands with increased time Safety/Judgement: Decreased awareness of safety   Problem Solving: Slow processing General Comments: Not specifically tested, but pt is HOH, so I am not sure how much of what I am saying she is getting.              Pertinent Vitals/Pain Pain Assessment: Faces Faces Pain Scale: Hurts little more  Pain Location: chest tube site Pain Descriptors / Indicators: Grimacing;Guarding;Discomfort Pain Intervention(s): Limited activity within patient's tolerance;Monitored during session;Repositioned           PT Goals (current goals can now be found in the care plan section) Acute Rehab PT Goals PT Goal Formulation: With patient Time For Goal Achievement:  05/03/18 Potential to Achieve Goals: Good Progress towards PT goals: Progressing toward goals(goals update due today)    Frequency    Min 3X/week      PT Plan Current plan remains appropriate       AM-PAC PT "6 Clicks" Daily Activity  Outcome Measure  Difficulty turning over in bed (including adjusting bedclothes, sheets and blankets)?: Unable Difficulty moving from lying on back to sitting on the side of the bed? : Unable Difficulty sitting down on and standing up from a chair with arms (e.g., wheelchair, bedside commode, etc,.)?: Unable Help needed moving to and from a bed to chair (including a wheelchair)?: A Little Help needed walking in hospital room?: A Little Help needed climbing 3-5 steps with a railing? : A Lot 6 Click Score: 11    End of Session Equipment Utilized During Treatment: Oxygen Activity Tolerance: Patient limited by fatigue Patient left: in chair;with call bell/phone within reach;with family/visitor present;with chair alarm set   PT Visit Diagnosis: Unsteadiness on feet (R26.81);History of falling (Z91.81);Muscle weakness (generalized) (M62.81)     Time: 1659-1730 PT Time Calculation (min) (ACUTE ONLY): 31 min  Charges:  $Gait Training: 8-22 mins $Therapeutic Activity: 8-22 mins          Ameenah Prosser B. [place], [place], DPT [phone]            04/19/2018, 5:40 PM

## 2018-04-20 ENCOUNTER — Inpatient Hospital Stay (HOSPITAL_COMMUNITY): Payer: Medicare Other

## 2018-04-20 NOTE — Progress Notes (Addendum)
      [address]       ,[place] [address]             [phone]         Subjective: No complaints  Objective: Vital signs in last 24 hours: Temp:  [97.6 F (36.4 C)-98.9 F (37.2 C)] 97.6 F (36.4 C) (05/18 0432) Pulse Rate:  [91-103] 93 (05/18 0432) Cardiac Rhythm: Normal sinus rhythm (05/18 0830) Resp:  [16-20] 16 (05/18 0432) BP: (117-128)/(52-61) 128/52 (05/18 0432) SpO2:  [99 %-100 %] 100 % (05/18 0432) Weight:  [177 lb 14.6 oz (80.7 kg)] 177 lb 14.6 oz (80.7 kg) (05/18 0500)     Intake/Output from previous day: 05/17 0701 - 05/18 0700 In: 1551.3 [P.O.:897; I.V.:254.3; IV Piggyback:400] Out: 10 [Chest Tube:10] Intake/Output this shift: No intake/output data recorded.  General appearance: alert, cooperative and no distress Heart: regular rate and rhythm, S1, S2 normal, no murmur, click, rub or gallop Lungs: clear to auscultation bilaterally Abdomen: soft, non-tender; bowel sounds normal; no masses,  no organomegaly Extremities: extremities normal, atraumatic, no cyanosis or edema Wound: chest tube in good placement  Lab Results: Recent Labs    04/19/18 0441  WBC 7.8  HGB 8.0*  HCT 25.2*  PLT 413*   BMET:  Recent Labs    04/19/18 0441  NA 141  K 3.6  CL 100*  CO2 30  GLUCOSE 105*  BUN 16  CREATININE 0.88  CALCIUM 8.5*    PT/INR: No results for input(s): LABPROT, INR in the last 72 hours. ABG    Component Value Date/Time   PHART 7.472 (H) 04/02/2018 0158   HCO3 17.5 (L) 04/02/2018 0158   TCO2 18 (L) 04/02/2018 0158   ACIDBASEDEF 5.0 (H) 04/02/2018 0158   O2SAT 100.0 04/02/2018 0158   CBG (last 3)  No results for input(s): GLUCAP in the last 72 hours.  Assessment/Plan: S/P thrombolytics in her chest tube yesterday.  1. Residual loculated pleural effusion-thrombolytics x 2 2. No CXR this morning. Will order. Chest tube drainage is scant at 10cc/24 hours. 3. Dispo-await CXR. There has been no significant output since  thrombolytics. May be able to remove chest tube today.     LOS: 19 days    Monique Lin 04/20/2018  Plan d/c chest tube today  I have seen and examined Monique Lin and agree with the above assessment  and plan.  [name] MD Beeper [phone] Office [phone] 04/20/2018 12:04 PM

## 2018-04-20 NOTE — Progress Notes (Addendum)
1130 Per telemetry monitoring (CCMD) pt had episodes of Bigeminy and PVCs and then goes back to NSR. Nicholes Rough notified, will do EKG today. Also I clarified the order for flushing of drainage cath TID (ordered by IR), nurses do not flush chest tube. Order d/c'd.  1239 Chest tube was removed by Darrell Allred PA, I.R. Dressing dry and intact. Pt denies pain, no SOB.

## 2018-04-20 NOTE — Progress Notes (Signed)
Referring Physician(s): Hendrickson,S  Supervising Physician: Arne Cleveland  Patient Status:  [facility] - In-pt  Chief Complaint:  Left loculated pleural effusion/empyema  Subjective: Pt resting quietly; no acute changes; daughter in room   Allergies: Codeine and Penicillins  Medications: Prior to Admission medications   Medication Sig Start Date End Date Taking? Authorizing Provider  albuterol (PROVENTIL HFA;VENTOLIN HFA) 108 (90 BASE) MCG/ACT inhaler Inhale 2 puffs into the lungs every 6 (six) hours as needed for wheezing or shortness of breath.    Yes [provider]  albuterol (PROVENTIL) (2.5 MG/3ML) 0.083% nebulizer solution Take 2.5 mg by nebulization every 6 (six) hours as needed for wheezing.   Yes [provider]  amLODipine (NORVASC) 5 MG tablet Take 5 mg by mouth daily.   Yes [provider]  aspirin EC 81 MG tablet Take 81 mg by mouth daily.   Yes [provider]  atorvastatin (LIPITOR) 20 MG tablet Take 20 mg by mouth daily.   Yes [provider]  carvedilol (COREG) 25 MG tablet Take 25 mg by mouth 2 (two) times daily with a meal.   Yes [provider]  Cholecalciferol (VITAMIN D-3 PO) Take 1 capsule by mouth daily.   Yes [provider]  Cyanocobalamin (VITAMIN B-12 PO) Take 500 mcg by mouth daily.    Yes [provider]  doxazosin (CARDURA) 4 MG tablet Take 4 mg by mouth at bedtime.    Yes [provider]  fluticasone (FLONASE) 50 MCG/ACT nasal spray Place 2 sprays into the nose daily as needed for allergies or rhinitis.    Yes [provider]  FOLIC ACID PO Take 1 tablet by mouth daily.    Yes [provider]  gabapentin (NEURONTIN) 300 MG capsule Take 300 mg by mouth 3 (three) times daily.    Yes [provider]  levothyroxine (SYNTHROID, LEVOTHROID) 50 MCG tablet Take 50 mcg by mouth daily.   Yes [provider]  Liniments (SALONPAS PAIN RELIEF  PATCH EX) Apply 1 patch topically daily as needed (pain).   Yes [provider]  losartan-hydrochlorothiazide (HYZAAR) 50-12.5 MG per tablet Take 1 tablet by mouth daily.   Yes [provider]  traMADol (ULTRAM) 50 MG tablet Take 1 tablet (50 mg total) by mouth every 6 (six) hours as needed. Patient taking differently: Take 50 mg by mouth See admin instructions. Take 50 mg by mouth once a day as needed for pain and 50 mg at bedtime scheduled 03/17/18  Yes Daleen Bo, MD  trolamine salicylate (ASPERCREME) 10 % cream Apply 1 application topically as needed (pain).   Yes [provider]  cefdinir (OMNICEF) 300 MG capsule Take 1 capsule (300 mg total) by mouth 2 (two) times daily. Patient not taking: Reported on 04/01/2018 05/17/13   Orpah Greek, MD     Vital Signs: BP (!) 120/56 (BP Location: Right Arm)   Pulse 86   Temp 98.8 F (37.1 C) (Oral)   Resp 12   Ht 5' 2.5" (1.588 m)   Wt 177 lb 14.6 oz (80.7 kg)   SpO2 100%   BMI 32.02 kg/m   Physical Exam left chest drain intact, insertion site ok, output 10 cc blood tinged fluid  Imaging: Dg Chest 1 View  Result Date: 04/20/2018 CLINICAL DATA:  Chest tube. EXAM: CHEST  1 VIEW COMPARISON:  04/19/2018 and CT chest 04/17/2018. FINDINGS: Trachea is midline. Heart is enlarged, stable. Thoracic aorta is calcified. A small bore drain  is seen at the base of the left hemithorax. Persistent left basilar collapse/consolidation and associated pleural fluid, unchanged. Mild right basilar volume loss. IMPRESSION: 1. Small bore drain at the base of the left hemithorax with persistent left basilar collapse/consolidation and associated loculated pleural fluid. Findings are unchanged from 04/19/2018. 2. Right basilar volume loss. 3.  Aortic atherosclerosis (ICD10-170.0). Electronically Signed   By: Lorin Picket M.D.   On: 04/20/2018 11:58   Ct Chest Wo Contrast  Result Date: 04/18/2018 CLINICAL DATA:  Follow-up left  empyema. EXAM: CT CHEST WITHOUT CONTRAST TECHNIQUE: Multidetector CT imaging of the chest was performed following the standard protocol without IV contrast. COMPARISON:  04/12/2018 FINDINGS: Cardiovascular: No acute findings. Aortic and coronary artery atherosclerosis. Mediastinum/Nodes: No masses or pathologically enlarged lymph nodes identified on this unenhanced exam. Lungs/Pleura: There has been interval placement of a pigtail catheter in the left posterior pleural space. Multiloculated left pleural effusion has decreased in size since previous study. No evidence of pneumothorax. Mild decrease in compressive atelectasis in the lingula and left lower lobe since prior study. Small right pleural effusion is also decreased in size since previous study, with decreased atelectasis in right lower lobe. Upper Abdomen:  Unremarkable. Musculoskeletal:  No suspicious bone lesions. IMPRESSION: Interval decrease in multiloculated left pleural effusion and compressive atelectasis. Decreased size of small right pleural effusion and right lower lobe atelectasis. Aortic Atherosclerosis (ICD10-I70.0). Coronary artery calcification. Electronically Signed   By: Earle Gell M.D.   On: 04/18/2018 11:11   Dg Chest Port 1 View  Result Date: 04/19/2018 CLINICAL DATA:  Chest 2.  Follow-up exam. EXAM: PORTABLE CHEST 1 VIEW COMPARISON:  04/17/2018 and older studies. FINDINGS: Left inferior hemithorax chest tube is unchanged from prior exam. There is persistent lung base opacity which obscures hemidiaphragms, consistent with combination of pleural fluid and atelectasis and/or pneumonia. No new lung abnormalities. No pneumothorax. IMPRESSION: 1. No significant change from the previous exam. 2. Stable left chest tube. Stable pleural effusions and lung base opacity, most likely atelectasis. 3. No pneumothorax. Electronically Signed   By: Lajean Manes M.D.   On: 04/19/2018 09:46   Dg Chest Port 1 View  Result Date: 04/17/2018 CLINICAL  DATA:  Pleural effusion, chest tube EXAM: PORTABLE CHEST 1 VIEW COMPARISON:  Portable exam 0709 hours compared to 04/16/2018 FINDINGS: Pigtail LEFT basilar thoracostomy tube again seen. Minimal enlargement of cardiac silhouette. Atherosclerotic calcifications aorta. Persistent LEFT pleural effusion and basilar atelectasis. Subsegmental atelectasis at RIGHT base. Upper lungs clear. No pneumothorax or segmental consolidation. Bones demineralized. IMPRESSION: Persistent LEFT basilar pleural effusion and atelectasis. Mild RIGHT basilar atelectasis. Electronically Signed   By: Lavonia Dana M.D.   On: 04/17/2018 09:53   Dg Chest Port 1 View  Result Date: 04/16/2018 CLINICAL DATA:  Empyema. EXAM: PORTABLE CHEST 1 VIEW COMPARISON:  Radiograph of Apr 15, 2018. FINDINGS: Stable cardiomegaly. Atherosclerosis of thoracic aorta is noted. No pneumothorax is noted. Left upper lobe catheter has been removed. Left basilar catheter remains. Stable moderate left pleural effusion is noted with associated atelectasis or infiltrate. Minimal right basilar subsegmental atelectasis is noted. Bony thorax is unremarkable. IMPRESSION: Stable position of left basilar pigtail catheter with stable moderate left pleural effusion and associated atelectasis or infiltrate. Status post removal left upper lobe catheter without pneumothorax. Electronically Signed   By: Marijo Conception, M.D.   On: 04/16/2018 15:16    Labs:  CBC: Recent Labs    04/14/18 0417 04/16/18 0405 04/17/18 0425 04/19/18 0441  WBC 17.1* 13.0*  11.0* 7.8  HGB 8.0* 7.7* 8.2* 8.0*  HCT 25.1* 23.8* 25.9* 25.2*  PLT 509* 436* 453* 413*    COAGS: Recent Labs    04/01/18 1928  INR 1.36  APTT 39*    BMP: Recent Labs    04/14/18 0417 04/16/18 0405 04/17/18 0425 04/19/18 0441  NA 140 137 137 141  K 3.6 3.3* 3.3* 3.6  CL 104 96* 93* 100*  CO2 28 31 32 30  GLUCOSE 103* 97 95 105*  BUN 11 16 15 16   CALCIUM 8.0* 8.2* 8.3* 8.5*  CREATININE 0.91 0.97 1.03*  0.88  GFRNONAA 55* 51* 47* 57*  GFRAA >60 59* 55* >60    LIVER FUNCTION TESTS: Recent Labs    04/01/18 1928  BILITOT 1.5*  AST 29  ALT 16  ALKPHOS 106  PROT 6.9  ALBUMIN 2.3*    Assessment and Plan: Left chest empyema, s/p drain placement 5/11; afebrile; minimal drain output following thrombolytics;  CXR today with no change from 5/17; TCTS rec tube removal- tube removed in its entirety without immediate complications; vaseline gauze dressing applied to site; f/u CXR pend   Electronically Signed: D. Rowe Robert, PA-C 04/20/2018, 1:38 PM   I spent a total of 20 minutes  at the the patient's bedside AND on the patient's hospital floor or unit, greater than 50% of which was counseling/coordinating care for left chest drain    Patient ID: Monique Lin, female   DOB: 1927-07-24, 84 y.o.   MRN: [id number]

## 2018-04-20 NOTE — Progress Notes (Signed)
Subjective:  Patient denies any chest pain or shortness of breath. Some bloody drainage noted through  chest tube, although drainage very minimal.  Objective:  Vital Signs in the last 24 hours: Temp:  [97.6 F (36.4 C)-98.9 F (37.2 C)] 97.6 F (36.4 C) (05/18 0432) Pulse Rate:  [91-103] 93 (05/18 0432) Resp:  [16-20] 16 (05/18 0432) BP: (117-128)/(52-61) 128/52 (05/18 0432) SpO2:  [99 %-100 %] 100 % (05/18 0432) Weight:  [80.7 kg (177 lb 14.6 oz)] 80.7 kg (177 lb 14.6 oz) (05/18 0500)  Intake/Output from previous day: 05/17 0701 - 05/18 0700 In: 1551.3 [P.O.:897; I.V.:254.3; IV Piggyback:400] Out: 10 [Chest Tube:10] Intake/Output from this shift: No intake/output data recorded.  Physical Exam: Neck: no adenopathy, no carotid bruit, no JVD and supple, symmetrical, trachea midline Lungs: Decreased breath sound at bases bilaterally with occasional rhonchi left base. Heart: regular rate and rhythm, regularly irregular rhythm and Soft systolic murmur noted no precordial rub. Abdomen: soft, non-tender; bowel sounds normal; no masses,  no organomegaly Extremities: extremities normal, atraumatic, no cyanosis or edema  Lab Results: Recent Labs    04/19/18 0441  WBC 7.8  HGB 8.0*  PLT 413*   Recent Labs    04/19/18 0441  NA 141  K 3.6  CL 100*  CO2 30  GLUCOSE 105*  BUN 16  CREATININE 0.88   No results for input(s): TROPONINI in the last 72 hours.  Invalid input(s): CK, MB Hepatic Function Panel No results for input(s): PROT, ALBUMIN, AST, ALT, ALKPHOS, BILITOT, BILIDIR, IBILI in the last 72 hours. No results for input(s): CHOL in the last 72 hours. No results for input(s): PROTIME in the last 72 hours.  Imaging: Imaging results have been reviewed and Dg Chest Port 1 View  Result Date: 04/19/2018 CLINICAL DATA:  Chest 2.  Follow-up exam. EXAM: PORTABLE CHEST 1 VIEW COMPARISON:  04/17/2018 and older studies. FINDINGS: Left inferior hemithorax chest tube is unchanged  from prior exam. There is persistent lung base opacity which obscures hemidiaphragms, consistent with combination of pleural fluid and atelectasis and/or pneumonia. No new lung abnormalities. No pneumothorax. IMPRESSION: 1. No significant change from the previous exam. 2. Stable left chest tube. Stable pleural effusions and lung base opacity, most likely atelectasis. 3. No pneumothorax. Electronically Signed   By: Lajean Manes M.D.   On: 04/19/2018 09:46    Cardiac Studies:  Assessment/Plan:  ResolvingLeft lung pneumoniacomplicated by empyemaStatus post bronchoscopynoted to have bronchomalacia. Status post CT-guided drain left pleural spacedrainagestatus post removal of upper chest tube Possible pleuropericarditis Status post septic shock. 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 Hypomagnesemia. Plan Continue present management Repeat chest x-ray and labs in a.m. Encouraged ambulation.   LOS: 19 days    [name] 04/20/2018, 10:07 AM

## 2018-04-21 ENCOUNTER — Inpatient Hospital Stay (HOSPITAL_COMMUNITY): Payer: Medicare Other

## 2018-04-21 ENCOUNTER — Encounter (HOSPITAL_COMMUNITY): Payer: Self-pay | Admitting: *Deleted

## 2018-04-21 LAB — BASIC METABOLIC PANEL
Anion gap: 10 (ref 5–15)
BUN: 23 mg/dL — ABNORMAL HIGH (ref 6–20)
CHLORIDE: 102 mmol/L (ref 101–111)
CO2: 31 mmol/L (ref 22–32)
Calcium: 8.9 mg/dL (ref 8.9–10.3)
Creatinine, Ser: 1.07 mg/dL — ABNORMAL HIGH (ref 0.44–1.00)
GFR calc non Af Amer: 45 mL/min — ABNORMAL LOW (ref >60)
GFR, EST AFRICAN AMERICAN: 52 mL/min — AB (ref >60)
Glucose, Bld: 111 mg/dL — ABNORMAL HIGH (ref 65–99)
POTASSIUM: 3 mmol/L — AB (ref 3.5–5.1)
SODIUM: 143 mmol/L (ref 135–145)

## 2018-04-21 LAB — CBC
HEMATOCRIT: 25 % — AB (ref 36.0–46.0)
Hemoglobin: 7.8 g/dL — ABNORMAL LOW (ref 12.0–15.0)
MCH: 28.8 pg (ref 26.0–34.0)
MCHC: 31.2 g/dL (ref 30.0–36.0)
MCV: 92.3 fL (ref 78.0–100.0)
Platelets: 350 10*3/uL (ref 150–400)
RBC: 2.71 MIL/uL — AB (ref 3.87–5.11)
RDW: 14.7 % (ref 11.5–15.5)
WBC: 7.7 10*3/uL (ref 4.0–10.5)

## 2018-04-21 LAB — GLUCOSE, CAPILLARY: Glucose-Capillary: 113 mg/dL — ABNORMAL HIGH (ref 65–99)

## 2018-04-21 LAB — MAGNESIUM: Magnesium: 1.8 mg/dL (ref 1.7–2.4)

## 2018-04-21 MED ORDER — POTASSIUM CHLORIDE CRYS ER 20 MEQ PO TBCR
40.0000 meq | EXTENDED_RELEASE_TABLET | Freq: Once | ORAL | Status: AC
  Administered 2018-04-21: 40 meq via ORAL
  Filled 2018-04-21: qty 2

## 2018-04-21 NOTE — Progress Notes (Signed)
Subjective:  Patient denies any chest pain or shortness of breath left chest tube was DC'd yesterday.  Objective:  Vital Signs in the last 24 hours: Temp:  [98.8 F (37.1 C)-99 F (37.2 C)] 99 F (37.2 C) (05/18 2005) Pulse Rate:  [86-99] 99 (05/18 2005) Resp:  [12] 12 (05/18 1251) BP: (120-130)/(54-56) 130/54 (05/18 2005) SpO2:  [97 %-100 %] 97 % (05/18 2005)  Intake/Output from previous day: No intake/output data recorded. Intake/Output from this shift: Total I/O In: 450 [P.O.:450] Out: -   Physical Exam: Neck: no adenopathy, no carotid bruit, no JVD and supple, symmetrical, trachea midline Lungs: Decreased breath sound at left base. Air entry  improved right lung field Heart: regular rate and rhythm, S1, S2 normal and Soft systolic murmur noted no pericardial rub Abdomen: soft, non-tender; bowel sounds normal; no masses,  no organomegaly Extremities: extremities normal, atraumatic, no cyanosis or edema  Lab Results: Recent Labs    04/19/18 0441 04/21/18 0531  WBC 7.8 7.7  HGB 8.0* 7.8*  PLT 413* 350   Recent Labs    04/19/18 0441 04/21/18 0531  NA 141 143  K 3.6 3.0*  CL 100* 102  CO2 30 31  GLUCOSE 105* 111*  BUN 16 23*  CREATININE 0.88 1.07*   No results for input(s): TROPONINI in the last 72 hours.  Invalid input(s): CK, MB Hepatic Function Panel No results for input(s): PROT, ALBUMIN, AST, ALT, ALKPHOS, BILITOT, BILIDIR, IBILI in the last 72 hours. No results for input(s): CHOL in the last 72 hours. No results for input(s): PROTIME in the last 72 hours.  Imaging: Imaging results have been reviewed and Dg Chest 1 View  Result Date: 04/20/2018 CLINICAL DATA:  Chest tube. EXAM: CHEST  1 VIEW COMPARISON:  04/19/2018 and CT chest 04/17/2018. FINDINGS: Trachea is midline. Heart is enlarged, stable. Thoracic aorta is calcified. A small bore drain is seen at the base of the left hemithorax. Persistent left basilar collapse/consolidation and associated  pleural fluid, unchanged. Mild right basilar volume loss. IMPRESSION: 1. Small bore drain at the base of the left hemithorax with persistent left basilar collapse/consolidation and associated loculated pleural fluid. Findings are unchanged from 04/19/2018. 2. Right basilar volume loss. 3.  Aortic atherosclerosis (ICD10-170.0). Electronically Signed   By: Lorin Picket M.D.   On: 04/20/2018 11:58   Dg Chest Port 1 View  Result Date: 04/20/2018 CLINICAL DATA:  84 year old female with a history of chest tube removal EXAM: PORTABLE CHEST 1 VIEW COMPARISON:  04/20/2018 FINDINGS: Cardiomediastinal silhouette unchanged in size and contour. Persisting opacity at the left lung base unchanged from the prior. No pneumothorax. Interval removal of the left thoracostomy tube. Right lung relatively well aerated. Osteopenia. IMPRESSION: Opacity persists at the left lung base status post left thoracostomy tube removal, with no pneumothorax. Electronically Signed   By: Corrie Mckusick D.O.   On: 04/20/2018 14:17    Cardiac Studies:  Assessment/Plan:  ResolvingLeft lung pneumoniacomplicated by empyemaStatus post bronchoscopynoted to have bronchomalacia. Status post CT-guided drain left pleural spacedrainagestatus post removal of upper chest tube Possible pleuropericarditis Status post septic shock. 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 Status post Hypomagnesemia. Plan Replace K Check labs in a.m. Transfer to regular floor   LOS: 20 days    [name] 04/21/2018, 10:27 AM

## 2018-04-21 NOTE — Progress Notes (Addendum)
      [address]       [place] [address]             [phone]         Subjective: She feels okay this morning. The chest tube removal went well.  Objective: Vital signs in last 24 hours: Temp:  [98.8 F (37.1 C)-99 F (37.2 C)] 99 F (37.2 C) (05/18 2005) Pulse Rate:  [86-99] 99 (05/18 2005) Cardiac Rhythm: Normal sinus rhythm (05/19 0700) Resp:  [12] 12 (05/18 1251) BP: (120-130)/(54-56) 130/54 (05/18 2005) SpO2:  [97 %-100 %] 97 % (05/18 2005)     Intake/Output from previous day: No intake/output data recorded. Intake/Output this shift: Total I/O In: 450 [P.O.:450] Out: -   General appearance: alert, cooperative and no distress Heart: regular rate and rhythm, S1, S2 normal, no murmur, click, rub or gallop Lungs: clear to auscultation bilaterally Abdomen: soft, non-tender; bowel sounds normal; no masses,  no organomegaly Extremities: extremities normal, atraumatic, no cyanosis or edema Wound: n/a  Lab Results: Recent Labs    04/19/18 0441 04/21/18 0531  WBC 7.8 7.7  HGB 8.0* 7.8*  HCT 25.2* 25.0*  PLT 413* 350   BMET:  Recent Labs    04/19/18 0441 04/21/18 0531  NA 141 143  K 3.6 3.0*  CL 100* 102  CO2 30 31  GLUCOSE 105* 111*  BUN 16 23*  CREATININE 0.88 1.07*  CALCIUM 8.5* 8.9    PT/INR: No results for input(s): LABPROT, INR in the last 72 hours. ABG    Component Value Date/Time   PHART 7.472 (H) 04/02/2018 0158   HCO3 17.5 (L) 04/02/2018 0158   TCO2 18 (L) 04/02/2018 0158   ACIDBASEDEF 5.0 (H) 04/02/2018 0158   O2SAT 100.0 04/02/2018 0158   CBG (last 3)  Recent Labs    04/21/18 0119  GLUCAP 113*    Assessment/Plan: S/P thrombolytics in her chest tube on 5/17  1. Residual loculated pleural effusion-thrombolytics x 2 2. CXR appears stable s/p chest tube removal yesterday 3. Dispo-Sign off since tube is out and CXR is stable. We will follow-up as needed.      LOS: 20 days    [name] 04/21/2018  I  agree with the above assessment  and plan.  [name] MD Beeper [phone] Office [phone] 04/21/2018 11:30 AM

## 2018-04-22 LAB — CBC
HEMATOCRIT: 25.1 % — AB (ref 36.0–46.0)
Hemoglobin: 7.8 g/dL — ABNORMAL LOW (ref 12.0–15.0)
MCH: 28.8 pg (ref 26.0–34.0)
MCHC: 31.1 g/dL (ref 30.0–36.0)
MCV: 92.6 fL (ref 78.0–100.0)
Platelets: 333 10*3/uL (ref 150–400)
RBC: 2.71 MIL/uL — ABNORMAL LOW (ref 3.87–5.11)
RDW: 15.1 % (ref 11.5–15.5)
WBC: 7.5 10*3/uL (ref 4.0–10.5)

## 2018-04-22 LAB — BASIC METABOLIC PANEL
Anion gap: 11 (ref 5–15)
BUN: 27 mg/dL — AB (ref 6–20)
CALCIUM: 9.2 mg/dL (ref 8.9–10.3)
CHLORIDE: 102 mmol/L (ref 101–111)
CO2: 29 mmol/L (ref 22–32)
CREATININE: 1.01 mg/dL — AB (ref 0.44–1.00)
GFR calc Af Amer: 56 mL/min — ABNORMAL LOW (ref >60)
GFR, EST NON AFRICAN AMERICAN: 48 mL/min — AB (ref >60)
Glucose, Bld: 99 mg/dL (ref 65–99)
Potassium: 3.7 mmol/L (ref 3.5–5.1)
SODIUM: 142 mmol/L (ref 135–145)

## 2018-04-22 NOTE — Progress Notes (Signed)
Occupational Therapy Treatment Patient Details Name: Monique Lin MRN: [id number] DOB: [birth year] Today's Date: 04/22/2018    History of present illness 84 y/o F admitted 4/29 with 2 week hx of progressive SOB.  Seen 4/21 and treated for bronchitis with prednisone + antihistamines without improvement. CXR clear at that time.  Returned 4/29 with generalized weakness, decreased appetite and AMS.  Work up concerning for LLL opacity with associated pleural effusion & hypotension requiring vasopressors.  CXR has had intermittent white out on left despite bronchoscopy and attempts at thoracentesis (limited fluid returned).  Bronchoscopy demonstrated bronchomalacia.  Pt s/p pleural drain x 2 (to chest tube waterseal, continuous suction).    OT comments  Pt requiring encouragement to work with OT, reporting feeling fatigued. Pt performed toileting, seated grooming and UB dressing with min to mod assist. Educated/performed incentive spirometer. Pt remained up in chair.  Follow Up Recommendations  SNF;Supervision/Assistance - 24 hour    Equipment Recommendations       Recommendations for Other Services      Precautions / Restrictions Precautions Precautions: Fall Precaution Comments: chest tube removed 5/18       Mobility Bed Mobility General bed mobility comments: pt in chair  Transfers Overall transfer level: Needs assistance Equipment used: Rolling walker (2 wheeled) Transfers: Sit to/from Stand Sit to Stand: Min assist        General transfer comment: verbal cues for hand placement, assist to rise    Balance Overall balance assessment: Needs assistance Sitting-balance support: Feet supported;No upper extremity supported Sitting balance-Leahy Scale: Fair     Standing balance support: Bilateral upper extremity supported;During functional activity Standing balance-Leahy Scale: Poor Standing balance comment: needs external assist in standing.                            ADL either performed or assessed with clinical judgement   ADL Overall ADL's : Needs assistance/impaired Eating/Feeding: Set up;Sitting   Grooming: Oral care;Sitting;Minimal assistance           Upper Body Dressing : Sitting;Minimal assistance       Toilet Transfer: Minimal assistance;Ambulation;RW;BSC   Toileting- Clothing Manipulation and Hygiene: Moderate assistance         General ADL Comments: Performed incentive spirometer exercises x 4 with encouragement.     Vision   Vision Assessment?: No apparent visual deficits   Perception     Praxis      Cognition Arousal/Alertness: Awake/alert Behavior During Therapy: Flat affect Overall Cognitive Status: Impaired/Different from baseline Area of Impairment: Memory;Problem solving                     Memory: Decreased short-term memory       Problem Solving: Slow processing General Comments: pt asking, "what do you want me to do." Verbal cues to sequence grooming task.         Exercises     Shoulder Instructions       General Comments      Pertinent Vitals/ Pain       Pain Assessment: Faces Faces Pain Scale: Hurts little more Pain Location: back, dressing site Pain Descriptors / Indicators: Discomfort Pain Intervention(s): Monitored during session;Repositioned  Home Living                                          Prior  Functioning/Environment              Frequency  Min 2X/week        Progress Toward Goals  OT Goals(current goals can now be found in the care plan section)  Progress towards OT goals: Progressing toward goals  Acute Rehab OT Goals Patient Stated Goal: get stronger in rehab and then return home OT Goal Formulation: With patient Time For Goal Achievement: 04/22/18 Potential to Achieve Goals: Good  Plan Discharge plan remains appropriate    Co-evaluation                 AM-PAC PT "6 Clicks" Daily Activity     Outcome  Measure   Help from another person eating meals?: A Little Help from another person taking care of personal grooming?: A Little Help from another person toileting, which includes using toliet, bedpan, or urinal?: A Lot Help from another person bathing (including washing, rinsing, drying)?: A Lot Help from another person to put on and taking off regular upper body clothing?: A Little Help from another person to put on and taking off regular lower body clothing?: A Lot 6 Click Score: 15    End of Session Equipment Utilized During Treatment: Gait belt;Rolling walker;Oxygen  OT Visit Diagnosis: Other abnormalities of gait and mobility (R26.89);Pain;Muscle weakness (generalized) (M62.81)   Activity Tolerance Patient limited by fatigue   Patient Left in chair;with call bell/phone within reach   Nurse Communication          Time: 4718-5501 OT Time Calculation (min): 15 min  Charges: OT General Charges $OT Visit: 1 Visit OT Treatments $Self Care/Home Management : 8-22 mins  04/22/2018 Nestor Lewandowsky, OTR/L Pager: [phone]   Werner Lean, Haze Boyden 04/22/2018, 2:56 PM

## 2018-04-22 NOTE — Social Work (Addendum)
CSW aware pt may be more amenable to SNF placement, has been able to work with therapies today.  CSW will f/u with pt and pt daughters regarding SNF offers.  4:25pm- CSW left HIPPA compliant message on daughter Sherry's cell phone, will follow up with pt family tomorrow.   [name], [place] [facility] [phone]

## 2018-04-22 NOTE — Progress Notes (Signed)
Physical Therapy Treatment Patient Details Name: Monique Lin MRN: [id number] DOB: March 06, 1927 Today's Date: 04/22/2018    History of Present Illness 84 y/o F admitted 4/29 with 2 week hx of progressive SOB.  Seen 4/21 and treated for bronchitis with prednisone + antihistamines without improvement. CXR clear at that time.  Returned 4/29 with generalized weakness, decreased appetite and AMS.  Work up concerning for LLL opacity with associated pleural effusion & hypotension requiring vasopressors.  CXR has had intermittent white out on left despite bronchoscopy and attempts at thoracentesis (limited fluid returned).  Bronchoscopy demonstrated bronchomalacia.  Pt s/p pleural drain x 2 (to chest tube waterseal, continuous suction).     PT Comments    Chest tube removed 04/20/18. Pt able to perform hallway ambulation this session. She ambulated 100 feet with RW min guard assist, +2 for chair follow. Pt on 1 L O2 throughout session. SpO2 96% at rest. Desat to 92% during ambulation. Max HR 132, and HR 101 upon return to bed. Pt with notable fatigue following mobility but motivated to participate with therapy.    Follow Up Recommendations  SNF;Supervision/Assistance - 24 hour     Equipment Recommendations  Rolling walker with 5" wheels;3in1 (PT)    Recommendations for Other Services       Precautions / Restrictions Precautions Precautions: Fall Precaution Comments: chest tube removed 5/18 Restrictions Weight Bearing Restrictions: No    Mobility  Bed Mobility Overal bed mobility: Needs Assistance Bed Mobility: Supine to Sit;Sit to Supine     Supine to sit: HOB elevated;Min assist Sit to supine: HOB elevated;Min assist   General bed mobility comments: +rail, cues for sequencing, increased time  Transfers Overall transfer level: Needs assistance Equipment used: Rolling walker (2 wheeled) Transfers: Sit to/from Stand Sit to Stand: Min assist Stand pivot transfers: Min assist        General transfer comment: verbal cues for hand placement, assist to power up  Ambulation/Gait Ambulation/Gait assistance: Min guard;+2 safety/equipment Ambulation Distance (Feet): 100 Feet Assistive device: Rolling walker (2 wheeled) Gait Pattern/deviations: Step-through pattern;Decreased stride length;Trunk flexed Gait velocity: decreased Gait velocity interpretation: <1.31 ft/sec, indicative of household ambulator General Gait Details: pt ambulated on 1 L O2 with desat to 92% during ambulation. +2 utilized for chair follow. Cues for posture and to stay close to RW. Max HR 132.    Stairs             Wheelchair Mobility    Modified Rankin (Stroke Patients Only)       Balance Overall balance assessment: Needs assistance Sitting-balance support: Feet supported;No upper extremity supported Sitting balance-Leahy Scale: Fair     Standing balance support: Bilateral upper extremity supported;During functional activity Standing balance-Leahy Scale: Poor                              Cognition Arousal/Alertness: Awake/alert Behavior During Therapy: Flat affect Overall Cognitive Status: Within Functional Limits for tasks assessed                                        Exercises      General Comments        Pertinent Vitals/Pain Pain Assessment: No/denies pain    Home Living                      Prior  Function            PT Goals (current goals can now be found in the care plan section) Acute Rehab PT Goals Patient Stated Goal: home PT Goal Formulation: With patient Time For Goal Achievement: 05/03/18 Potential to Achieve Goals: Good Progress towards PT goals: Progressing toward goals    Frequency    Min 3X/week      PT Plan Current plan remains appropriate    Co-evaluation              AM-PAC PT "6 Clicks" Daily Activity  Outcome Measure  Difficulty turning over in bed (including adjusting bedclothes,  sheets and blankets)?: A Lot Difficulty moving from lying on back to sitting on the side of the bed? : Unable Difficulty sitting down on and standing up from a chair with arms (e.g., wheelchair, bedside commode, etc,.)?: A Lot Help needed moving to and from a bed to chair (including a wheelchair)?: A Little Help needed walking in hospital room?: A Little Help needed climbing 3-5 steps with a railing? : A Lot 6 Click Score: 13    End of Session Equipment Utilized During Treatment: Gait belt;Oxygen Activity Tolerance: Patient tolerated treatment well Patient left: in bed;with call bell/phone within reach;with family/visitor present Nurse Communication: Mobility status PT Visit Diagnosis: Unsteadiness on feet (R26.81);History of falling (Z91.81);Muscle weakness (generalized) (M62.81)     Time: 0940-1005 PT Time Calculation (min) (ACUTE ONLY): 25 min  Charges:  $Gait Training: 23-37 mins                    G Codes:       Lorrin Goodell, PT  Office # [phone] Pager [phone]    Monique Lin 04/22/2018, 11:52 AM

## 2018-04-22 NOTE — Progress Notes (Signed)
Subjective:  Patient denies any chest pain or shortness of breath.  States breathing is gradually improving.  Appetite remains poor.  Encourage by mouth intake.  Discussed with patient regarding skilled nursing facility to which she agrees.  Objective:  Vital Signs in the last 24 hours: Temp:  [98.6 F (37 C)-98.9 F (37.2 C)] 98.6 F (37 C) (05/20 0520) Pulse Rate:  [85-96] 85 (05/20 0520) Resp:  [18] 18 (05/19 1321) BP: (128-145)/(56-61) 139/61 (05/20 0520) SpO2:  [98 %-100 %] 100 % (05/20 0520) Weight:  [76.5 kg (168 lb 10.4 oz)-80.7 kg (177 lb 14.6 oz)] 80.7 kg (177 lb 14.6 oz) (05/20 0500)  Intake/Output from previous day: 05/19 0701 - 05/20 0700 In: 550 [P.O.:450; IV Piggyback:100] Out: -  Intake/Output from this shift: No intake/output data recorded.  Physical Exam: Neck: no adenopathy, no carotid bruit, no JVD and supple, symmetrical, trachea midline Lungs: decreased breath sounds at bases, left more than right Heart: regular rate and rhythm, S1, S2 normal and soft systolic murmur noted Abdomen: soft, non-tender; bowel sounds normal; no masses,  no organomegaly Extremities: extremities normal, atraumatic, no cyanosis or edema  Lab Results: Recent Labs    04/21/18 0531 04/22/18 0437  WBC 7.7 7.5  HGB 7.8* 7.8*  PLT 350 333   Recent Labs    04/21/18 0531 04/22/18 0437  NA 143 142  K 3.0* 3.7  CL 102 102  CO2 31 29  GLUCOSE 111* 99  BUN 23* 27*  CREATININE 1.07* 1.01*   No results for input(s): TROPONINI in the last 72 hours.  Invalid input(s): CK, MB Hepatic Function Panel No results for input(s): PROT, ALBUMIN, AST, ALT, ALKPHOS, BILITOT, BILIDIR, IBILI in the last 72 hours. No results for input(s): CHOL in the last 72 hours. No results for input(s): PROTIME in the last 72 hours.  Imaging: Imaging results have been reviewed and Dg Chest 2 View  Result Date: 04/21/2018 CLINICAL DATA:  History of pneumonia. EXAM: CHEST - 2 VIEW COMPARISON:  Chest  radiograph 04/20/2018. FINDINGS: Monitoring leads overlie the patient. Stable cardiomegaly. Persistent moderate left pleural effusion and underlying opacities. Heterogeneous opacities right lung base. No pneumothorax. Thoracic spine degenerative changes. IMPRESSION: Persistent moderate left pleural effusion with underlying opacities. Right basilar opacities may represent atelectasis or infection. Electronically Signed   By: Lovey Newcomer M.D.   On: 04/21/2018 11:52   Dg Chest Port 1 View  Result Date: 04/20/2018 CLINICAL DATA:  84 year old female with a history of chest tube removal EXAM: PORTABLE CHEST 1 VIEW COMPARISON:  04/20/2018 FINDINGS: Cardiomediastinal silhouette unchanged in size and contour. Persisting opacity at the left lung base unchanged from the prior. No pneumothorax. Interval removal of the left thoracostomy tube. Right lung relatively well aerated. Osteopenia. IMPRESSION: Opacity persists at the left lung base status post left thoracostomy tube removal, with no pneumothorax. Electronically Signed   By: Corrie Mckusick D.O.   On: 04/20/2018 14:17    Cardiac Studies:  Assessment/Plan:  ResolvingLeft lung pneumoniacomplicated by empyemaStatus post bronchoscopynoted to have bronchomalacia. Status post CT-guided drain left pleural spacedrainagestatus post removal of both chest tubes Possible pleuropericarditis Status post septic shock. 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 Status post Hypomagnesemia. Plan Social service for discharge planning to skilled nursing facility. Increase ambulation as tolerated. Encourage incentive spirometry  Transfer to regular floor Will switch to by mouth Augmentin in one to 2 days if stable   LOS: 21 days     [name] 04/22/2018, 12:51 PM

## 2018-04-23 MED ORDER — AMOXICILLIN-POT CLAVULANATE 875-125 MG PO TABS
1.0000 | ORAL_TABLET | Freq: Two times a day (BID) | ORAL | Status: DC
  Administered 2018-04-23 – 2018-04-25 (×5): 1 via ORAL
  Filled 2018-04-23 (×5): qty 1

## 2018-04-23 NOTE — Progress Notes (Signed)
Subjective:   denies any complaints. Family looking into skilled nursing facility  now.  Objective:  Vital Signs in the last 24 hours: Temp:  [98.4 F (36.9 C)-98.7 F (37.1 C)] 98.7 F (37.1 C) (05/21 0615) Pulse Rate:  [88-90] 88 (05/21 0615) Resp:  [16] 16 (05/21 0615) BP: (134-160)/(63-76) 160/76 (05/21 0615) SpO2:  [96 %-99 %] 96 % (05/21 0615)  Intake/Output from previous day: 05/20 0701 - 05/21 0700 In: 540 [P.O.:340; IV Piggyback:200] Out: -  Intake/Output from this shift: No intake/output data recorded.  Physical Exam: Neck: no adenopathy, no carotid bruit, no JVD and supple, symmetrical, trachea midline Lungs: Decreased breath sound bilaterally left more than right and Heart: regular rate and rhythm, S1, S2 normal and  soft systolic murmur noted Abdomen: soft, non-tender; bowel sounds normal; no masses,  no organomegaly Extremities: extremities normal, atraumatic, no cyanosis or edema  Lab Results: Recent Labs    04/21/18 0531 04/22/18 0437  WBC 7.7 7.5  HGB 7.8* 7.8*  PLT 350 333   Recent Labs    04/21/18 0531 04/22/18 0437  NA 143 142  K 3.0* 3.7  CL 102 102  CO2 31 29  GLUCOSE 111* 99  BUN 23* 27*  CREATININE 1.07* 1.01*   No results for input(s): TROPONINI in the last 72 hours.  Invalid input(s): CK, MB Hepatic Function Panel No results for input(s): PROT, ALBUMIN, AST, ALT, ALKPHOS, BILITOT, BILIDIR, IBILI in the last 72 hours. No results for input(s): CHOL in the last 72 hours. No results for input(s): PROTIME in the last 72 hours.  Imaging: Imaging results have been reviewed and No results found.  Cardiac Studies:  Assessment/Plan:   ResolvingLeft lung pneumoniacomplicated by empyemaStatus post bronchoscopynoted to have bronchomalacia. Status post CT-guided drain left pleural spacedrainagestatus post removal of both chest tubes Possible pleuropericarditis Status post septic shock. Status postacute renal injury History of  hypertension Hyperlipidemia Hypothyroidism Acute on chronic anemia probably secondary to hydration rule out GI loss History of bronchial asthma History of GI stromal tumor in the past History of GI bleed in the past Status postHypokalemia. Status postHypomagnesemia. Plan DC Unasyn Start Augmentin as per orders Encouraged incentive spirometry Increase ambulation as tolerated Check chest x-ray in a.m.   LOS: 22 days    Monique Lin 04/23/2018, 10:28 AM

## 2018-04-23 NOTE — Social Work (Addendum)
CSW met with pt and pt daughter at bedside. Pt daughter states she has been looking at facilities. Pt daughter would like CSW to f/u with [facility], [facility], and [facility] for private rooms. CSW has been given permission to f/u with pt daughter with responses.   Per attending MD pt will likely discharge Thursday.  12:03pm- CSW f/u with daughter [name], her preference is for [facility]. CSW has alerted [facility] with discharge timing they will follow.   2:20pm- CSW arranged meeting time for pt daughter to meet with [facility] liaison (3pm today 5/21).   [name], [place] Work [phone]

## 2018-04-23 NOTE — Progress Notes (Signed)
Nutrition Follow-up  DOCUMENTATION CODES:   Obesity unspecified  INTERVENTION:   - Continue MVI with minerals daily  - Continue 30 ml Pro-stat TID, each provides 100 kcal and 15 grams protein  - Boost Breeze po TID, each supplement provides 250 kcal and 9 grams of protein  NUTRITION DIAGNOSIS:   Inadequate oral intake related to decreased appetite as evidenced by meal completion < 50%, per patient/family report.  Ongoing, pt eating more and this is being addressed with oral nutrition supplements  GOAL:   Patient will meet greater than or equal to 90% of their needs  Progressing, being addressed with oral nutrition supplements  MONITOR:   PO intake, Supplement acceptance, Labs, Weight trends, Skin, I & O's  REASON FOR ASSESSMENT:   LOS    ASSESSMENT:   84 year old female with past medical history significant for hypertension, hypothyroidism, hyperlipidemia, history of bronchial asthma, morbid obesity, history of GI stromal tumor in the past history of upper GI bleed in the past, came to the ER complaining of cough associated with left sided chest pain or difficulty breathing and generalized weakness and poor appetite for last 7 days.  4/30 - s/p thoracentesis (aspirated minimal drainage - approximately 1/4 ml of very viscous material) 5/1 - CXR revealed improved aeration in lt upper lobe  5/2 - s/p bronch revealed bronchomalacia  5/3 - s/p lt thoracentesis- 90 ml drained and sent to lab for analysis 5/5 - s/p lt chest tube placement per IR 5/11- second lt chest tube placed 5/12- new lt chest tube placed due to fracture at the hub 5/16 - thrombolytics by [facility]CTS 5/17 - second round thrombolytics by [facility] 5/18 - chest tube removed  Spoke with pt at beside who states she is tolerating Boost Breeze and Pro-stat supplements and will continue to take them. Pt also states, "I am eating more" and endorses an increased appetite.  Pt reports that breakfast consisted of oatmeal,  applesauce, and milk.  Meal Completion: 25-75% yesterday  Medications reviewed and include: 100 mg Colace daily, Boost Breeze TID, 20 ml Pro-stat TID, 325 mg ferrous sulfate TID, 40 mg Lasix daily, 50 mcg levothyroxine daily, MVI with minerals daily, 20 mEq KCL daily  Labs reviewed: BUN 27 (H), creatinine 1.01 (H), hemoglobin 7.8 (L), HCT 25.1 (L)  Diet Order:   Diet Order           Diet Heart Room service appropriate? Yes; Fluid consistency: Thin  Diet effective now          EDUCATION NEEDS:   Education needs have been addressed  Skin:  Skin Assessment: Reviewed RN Assessment  Last BM:  04/22/18 small type 6  Height:   Ht Readings from Last 1 Encounters:  04/01/18 5' 2.5" (1.588 m)    Weight:   Wt Readings from Last 1 Encounters:  04/22/18 177 lb 14.6 oz (80.7 kg)    Ideal Body Weight:  51.1 kg  BMI:  Body mass index is 32.02 kg/m.  Estimated Nutritional Needs:   Kcal:  1600-1800  Protein:  65-80 grams  Fluid:  > 1.6 L    Gaynell Face, MS, RD, LDN Pager: [phone] Weekend/After Hours: [phone]

## 2018-04-24 ENCOUNTER — Inpatient Hospital Stay (HOSPITAL_COMMUNITY): Payer: Medicare Other

## 2018-04-24 NOTE — Progress Notes (Signed)
Subjective:  Patient denies any chest pain or shortness of breath. States breathing is slowly improving.patient remains afebrile off IV antibiotics now. Family looking into skilled nursing facility [facility]  Objective:  Vital Signs in the last 24 hours: Temp:  [97.6 F (36.4 C)-98.4 F (36.9 C)] 98.4 F (36.9 C) (05/22 0631) Pulse Rate:  [79-100] 93 (05/22 0631) Resp:  [16] 16 (05/22 0631) BP: (124-133)/(53-60) 130/55 (05/22 0631) SpO2:  [96 %-100 %] 96 % (05/22 0631)  Intake/Output from previous day: 05/21 0701 - 05/22 0700 In: 600 [P.O.:600] Out: 400 [Urine:400] Intake/Output from this shift: Total I/O In: 120 [P.O.:120] Out: -   Physical Exam: Neck: no adenopathy, no carotid bruit, no JVD and supple, symmetrical, trachea midline Lungs: Decreased breath sound at the left base air entry has improved Heart: regular rate and rhythm, S1, S2 normal and Soft systolic murmur noted Abdomen: soft, non-tender; bowel sounds normal; no masses,  no organomegaly Extremities: extremities normal, atraumatic, no cyanosis or edema  Lab Results: Recent Labs    04/22/18 0437  WBC 7.5  HGB 7.8*  PLT 333   Recent Labs    04/22/18 0437  NA 142  K 3.7  CL 102  CO2 29  GLUCOSE 99  BUN 27*  CREATININE 1.01*   No results for input(s): TROPONINI in the last 72 hours.  Invalid input(s): CK, MB Hepatic Function Panel No results for input(s): PROT, ALBUMIN, AST, ALT, ALKPHOS, BILITOT, BILIDIR, IBILI in the last 72 hours. No results for input(s): CHOL in the last 72 hours. No results for input(s): PROTIME in the last 72 hours.  Imaging: Imaging results have been reviewed and Dg Chest 2 View  Result Date: 04/24/2018 CLINICAL DATA:  Left lower lobe pneumonia.  Empyema. EXAM: CHEST - 2 VIEW COMPARISON:  Chest x-rays dated 04/21/2018 and 04/19/2018 and chest CT dated 04/17/2018 FINDINGS: There has been a slight decrease in the minimal atelectasis and small effusion at the right base. No  significant change in the fusion and atelectasis at the left lung base. Heart size and vascularity are normal.  Aortic atherosclerosis. IMPRESSION: 1. Slight improvement in the right base atelectasis and effusion. 2. No change in the moderate left base atelectasis and effusion. Electronically Signed   By: Lorriane Shire M.D.   On: 04/24/2018 10:10    Cardiac Studies:  Assessment/Plan:  ResolvingLeft lung pneumoniacomplicated by empyemaStatus post bronchoscopynoted to have bronchomalacia. Status post CT-guided drain left pleural spacedrainagestatus post removal ofboth chest tubes Possible pleuropericarditis Status post septic shock. Status postacute renal injury History of hypertension Hyperlipidemia Hypothyroidism Acute on chronic anemia probably secondary to hydration rule out GI loss History of bronchial asthma History of GI stromal tumor in the past History of GI bleed in the past Status postHypokalemia. Status postHypomagnesemia. Plan Continue present management Possible discharge tomorrow once family decides regarding skilled nursing facility Patient encouraged incentive spirometry Out of bed to chair Ambulate as tolerated    LOS: 23 days    [name] 04/24/2018, 12:07 PM

## 2018-04-24 NOTE — Progress Notes (Signed)
Physical Therapy Treatment Patient Details Name: Monique Lin MRN: [id number] DOB: Mar 27, 1927 Today's Date: 04/24/2018    History of Present Illness 84 y/o F admitted 4/29 with 2 week hx of progressive SOB.  Seen 4/21 and treated for bronchitis with prednisone + antihistamines without improvement. CXR clear at that time.  Returned 4/29 with generalized weakness, decreased appetite and AMS.  Work up concerning for LLL opacity with associated pleural effusion & hypotension requiring vasopressors.  CXR has had intermittent white out on left despite bronchoscopy and attempts at thoracentesis (limited fluid returned).  Bronchoscopy demonstrated bronchomalacia.  Pt s/p pleural drain x 2 (to chest tube waterseal, continuous suction).     PT Comments    Patient progressing slowly towards goals. Ambulating 40 feet with RW and min guard assist. Cueing for activity pacing. Maintaining 93% on 1L O2 throughout ambulation. Rest of session focused on sit to stands for functional strengthening. Continue to progress endurance as tolerated.   Follow Up Recommendations  SNF;Supervision/Assistance - 24 hour     Equipment Recommendations  Rolling walker with 5" wheels;3in1 (PT)    Recommendations for Other Services       Precautions / Restrictions Precautions Precautions: Fall Restrictions Weight Bearing Restrictions: No    Mobility  Bed Mobility               General bed mobility comments: pt in chair  Transfers Overall transfer level: Needs assistance Equipment used: Rolling walker (2 wheeled) Transfers: Sit to/from Stand Sit to Stand: Min guard         General transfer comment: verbal cues for hand placement. Min guard from recliner and BSC   Ambulation/Gait Ambulation/Gait assistance: Min guard Ambulation Distance (Feet): 40 Feet Assistive device: Rolling walker (2 wheeled) Gait Pattern/deviations: Step-through pattern;Decreased stride length;Trunk flexed     General Gait  Details: Pt ambulating on 1 L O2 with oxygen saturation maintained at 93%. Cues for posture.    Stairs             Wheelchair Mobility    Modified Rankin (Stroke Patients Only)       Balance Overall balance assessment: Needs assistance Sitting-balance support: Feet supported;No upper extremity supported Sitting balance-Leahy Scale: Good       Standing balance-Leahy Scale: Poor Standing balance comment: needs external assist in standing.                            Cognition Arousal/Alertness: Awake/alert Behavior During Therapy: Flat affect Overall Cognitive Status: Impaired/Different from baseline Area of Impairment: Memory;Problem solving                     Memory: Decreased short-term memory       Problem Solving: Slow processing General Comments: pt asking, "what do you want me to do."       Exercises Other Exercises Other Exercises: 3 sit to stands for functional strengthening    General Comments        Pertinent Vitals/Pain Pain Assessment: No/denies pain    Home Living                      Prior Function            PT Goals (current goals can now be found in the care plan section) Acute Rehab PT Goals Patient Stated Goal: get stronger in rehab and then return home PT Goal Formulation: With patient Time For Goal  Achievement: 05/03/18 Potential to Achieve Goals: Good Progress towards PT goals: Progressing toward goals    Frequency    Min 3X/week      PT Plan Current plan remains appropriate    Co-evaluation              AM-PAC PT "6 Clicks" Daily Activity  Outcome Measure  Difficulty turning over in bed (including adjusting bedclothes, sheets and blankets)?: A Lot Difficulty moving from lying on back to sitting on the side of the bed? : Unable Difficulty sitting down on and standing up from a chair with arms (e.g., wheelchair, bedside commode, etc,.)?: A Lot Help needed moving to and from a  bed to chair (including a wheelchair)?: A Little Help needed walking in hospital room?: A Little Help needed climbing 3-5 steps with a railing? : A Lot 6 Click Score: 13    End of Session Equipment Utilized During Treatment: Gait belt;Oxygen Activity Tolerance: Patient tolerated treatment well Patient left: in chair;with call bell/phone within reach;with nursing/sitter in room Nurse Communication: Mobility status PT Visit Diagnosis: Unsteadiness on feet (R26.81);History of falling (Z91.81);Muscle weakness (generalized) (M62.81)     Time: 7622-6333 PT Time Calculation (min) (ACUTE ONLY): 28 min  Charges:  $Gait Training: 8-22 mins $Therapeutic Exercise: 8-22 mins                    G Codes:      Ellamae Sia, PT, DPT Acute Rehabilitation Services  Pager: [phone]   Willy Eddy 04/24/2018, 5:40 PM

## 2018-04-25 MED ORDER — CARVEDILOL 25 MG PO TABS: 12.5000 mg | ORAL_TABLET | Freq: Two times a day (BID) | ORAL | 60 refills | Status: DC

## 2018-04-25 MED ORDER — POLYETHYLENE GLYCOL 3350 17 G PO PACK: 17.0000 g | PACK | Freq: Every day | ORAL | 0 refills | Status: DC | PRN

## 2018-04-25 MED ORDER — CARVEDILOL 25 MG PO TABS: 25.0000 mg | ORAL_TABLET | Freq: Two times a day (BID) | ORAL | 60 refills | Status: DC

## 2018-04-25 MED ORDER — FERROUS SULFATE 325 (65 FE) MG PO TABS: 325.0000 mg | ORAL_TABLET | Freq: Three times a day (TID) | ORAL | 3 refills | Status: DC

## 2018-04-25 MED ORDER — AMOXICILLIN-POT CLAVULANATE 875-125 MG PO TABS: 1.0000 | ORAL_TABLET | Freq: Two times a day (BID) | ORAL | 0 refills | Status: DC

## 2018-04-25 NOTE — Social Work (Signed)
Pt has a bed at [facility], pt daughter will complete paperwork at 1:30pm. Room will be available at 3pm.  Clinical Social Worker facilitated patient discharge including contacting patient family and facility to confirm patient discharge plans.  Clinical information faxed to facility and family agreeable with plan.  CSW arranged ambulance transport via [facility] to [facility] at 4:00pm.  RN to call [phone] with report  prior to discharge.  Clinical Social Worker will sign off for now as social work intervention is no longer needed. Please consult [place] again if new need arises.  [name], [place] Social Worker [phone]

## 2018-04-25 NOTE — Clinical Social Work Placement (Signed)
   CLINICAL SOCIAL WORK PLACEMENT  NOTE [facility] room 3247 RN to call report [phone]  Date:  04/25/2018  Patient Details  Name: Monique Lin MRN: [id number] Date of Birth: [birth year]  Clinical Social Work is seeking post-discharge placement for this patient at the [place] level of care (*CSW will initial, date and re-position this form in  chart as items are completed):  Yes   Patient/family provided with [facility] Work Department's list of facilities offering this level of care within the geographic area requested by the patient (or if unable, by the patient's family).  Yes   Patient/family informed of their freedom to choose among providers that offer the needed level of care, that participate in Medicare, Medicaid or managed care program needed by the patient, have an available bed and are willing to accept the patient.  Yes   Patient/family informed of [facility]'s ownership interest in [facility], as well as of the fact that they are under no obligation to receive care at these facilities.  PASRR submitted to EDS on       PASRR number received on 04/10/18     Existing PASRR number confirmed on       FL2 transmitted to all facilities in geographic area requested by pt/family on 04/10/18     FL2 transmitted to all facilities within larger geographic area on       Patient informed that his/her managed care company has contracts with or will negotiate with certain facilities, including the following:        Yes   Patient/family informed of bed offers received.  Patient chooses bed at [facility]     Physician recommends and patient chooses bed at      Patient to be transferred to [facility] on 04/25/18.  Patient to be transferred to facility by [facility]     Patient family notified on 04/25/18 of transfer.  Name of family member notified:  daughter, [name]     PHYSICIAN    Additional Comment:    _______________________________________________ [name], [place] 04/25/2018, 11:57 AM

## 2018-04-25 NOTE — Discharge Summary (Signed)
Discharge summary dictated on 04/25/2018 dictation number is [phone]

## 2018-04-25 NOTE — Discharge Instructions (Signed)

## 2018-04-25 NOTE — Discharge Summary (Signed)
NAME: Monique Lin, Monique Lin MEDICAL RECORD [id number] ACCOUNT [id number] DATE OF BIRTH:1927/09/22 FACILITY: [facility] LOCATION: [facility] PHYSICIAN:Jaylynn Siefert Daivd Council, MD  DISCHARGE SUMMARY  DATE OF DISCHARGE:  04/25/2018  ADMITTING DIAGNOSES: 1.  Left lower lobe pneumonia with large pleural effusion, rule out empyema. 2.  Marked leukocytosis secondary to above. 3.  Status post septic shock. 4.  Acute renal injury. 5.  History of hypertension. 6.  Hyperlipidemia. 7.  Hypothyroidism. 8.  Anemia of chronic disease. 9.  History of bronchial asthma in the past. 10.  History of gastrointestinal stromal tumor in the past. 11.  History of gastrointestinal bleeding in the past.    DISCHARGE DIAGNOSES: 1.  Resolving left lower lobe pneumonia complicated by empyema, status post bronchoscopy noted to have bronchomalacia followed by his CT-guided drain of the left pleural space x2, status post removal of both the chest tubes, status post possible pleural  pericarditis. 2.  Status post septic shock. 3.  Status post acute renal injury. 4.  Hypertension. 5.  Hyperlipidemia. 6..  Hypothyroidism. 7.  Anemia of chronic disease. 8.  History of bronchial asthma. 9.  History of gastrointestinal  stromal tumor in the past. 10.  History of gastrointestinal bleed in the past. 11.  Status post hypokalemia/hypomagnesemia.    DISCHARGE HOME MEDICATIONS:   1.  Augmentin 875 mg 1 tablet twice daily for 10 more days. 2  Ferrous sulfate 325 mg one tablet three times daily. 3.  MiraLax 17 grams as needed for moderate constipation. 4.  Aspirin 81 mg daily. 5.  Atorvastatin 20 mg daily. 6.  Carvedilol 12.5 mg twice daily. 7.  Flonase 2 sprays as needed, folic acid one tablet daily. 8.  Levothyroxine 50 mcg daily. 9.  Losartan HCT 50/12.5 mg daily. 10.  Vitamin B12 one tablet daily. 11.  Vitamin D3 one tablet daily. 12.   Albuterol by nebulizer every 6 hours as needed.  The patient has been advised to stop  amlodipine, Omnicef, Cardura, Neurontin, tramadol.  DIET:  Low salt, low cholesterol diet.  ACTIVITY:  Increase activity as tolerated.  The patient will be transferred to [facility] for short-term rehab.  CONDITION AT DISCHARGE:  Stable.  We will repeat her x-ray in 1-2 weeks as outpatient.  BRIEF HISTORY AND HOSPITAL COURSE:  The patient is an 84 year old female with past medical history significant for hypertension, hypothyroidism, hyperlipidemia, history of bronchial asthma, morbid obesity, history of GI stromal tumor in the past ,  history of upper GI bleed in the past.  She came to the ER complaining of cough associated with left-sided chest pain and difficulty breathing and generalized weakness and poor appetite for the last seven days.  The patient denies any fever, but states  has been feeling cold.  Workup in the ED showed the patient was noted to be hypotensive and hypoxic.  X-ray showed large left-sided pleural effusion with white count of 26,000.  The patient received 2 liters of IV fluids and was started on IV Rocephin  and Zithromax with improvement in her blood pressure and her oxygenation.  The patient also was noted to have acute renal failure with creatinine of 3.59.  The patient was seen approximately two weeks ago in the ED and was noted to have normal x-ray and  creatinine and was treated from bronchitis.  PHYSICAL EXAMINATION: VITAL SIGNS:  Her blood pressure after 2 liters of fluid challenge was 98.65, pulse 98, respiration rate was 22.  O2 sats were 99% on V-mask. HEENT:  Conjunctivae pink. NECK:  Supple.  No JVD. LUNGS:  She had diffuse left-sided dullness to percussion, decreased breath sounds and rhonchi noted.  Right lung was clear to auscultation. CARDIOVASCULAR:  S1, S2 was normal.  There was soft systolic murmur. ABDOMEN:  Soft, nontender. EXTREMITIES:  There is no clubbing, cyanosis.  Trace edema was noted. NEUROLOGIC:  Grossly  intact.  LABORATORY DATA:  Her admission labs were sodium 134, potassium 4.3, glucose was 150, BUN 47, creatinine 3.59  Her hemoglobin was 9.8, hematocrit 29.4, white count of 25.6 and 25.9 thousand, platelet count 268 with a shift to the left.    Chest x-ray showed moderate to large left pleural effusion with atelectasis or pneumonia at the lingula and the left base.  Repeat electrolytes on 04/03/2018, sodium 139, potassium 3.9, BUN 32, creatinine is 1.68, which is trending down.  Her hemoglobin  was 8.6, hematocrit 25.2, white count remained elevated to 22.4.  Sodium is 142, potassium 3.7, BUN 27, creatinine 1.01.  Hemoglobin is 7.8, hematocrit 25.1, white count of 7.5, which has been stable for the last 3 days.    Last chest x-ray done on 04/24/2018 showed a slight improvement in the right base atelectasis with minimal effusion and mild to moderate left base atelectasis with effusion, which has clinically improved.  Her blood cultures have been negative.  Her  pleural cultures were positive for gram-negative rods.  BRIEF HOSPITAL COURSE:  The patient was admitted to Step Down Unit and subsequently was transferred to ICU.  Pan cultures were obtained and was started on broad spectrum antibiotics and critical care team consultation was obtained.  The patient  subsequently underwent ultrasound-guided thoracentesis without much drainage of the pleural fluid as it was felt to be loculated.  The patient subsequently underwent a fiberoptic bronchoscopy and was noted to have bronchomalacia.  Post-bronchoscopy, the  patient had a complete collapse of the left lung and with further suctioning, there was improvement in aeration of the left upper lobe.  The patient subsequently underwent a CT-guided chest tube placement by interventional radiology x2 with not much  significant drainage from the chest tube.  CVTS consultation was also obtained, but the patient was felt not to be a surgical candidate due to her  advanced age and multiple comorbidities.  The patient's antibiotics were switched to IV vancomycin and  Zosyn with clinical improvement in her symptoms.  The patient has remained afebrile for the last few days.  Her IV antibiotics have been switched to IV Unasyn first and then p.o.  Augmentin, which she is tolerating it well.  OT, PT consultation was  obtained.  The patient has been ambulating in the room without any problems.  States her breathing has markedly improved.  Clinically her lung exam appears to be markedly improved with improved aeration in both the lung fields.  The patient will be  discharged to skilled nursing facility for short-term rehab to which family has agreed now.  DISCHARGE INSTRUCTIONS:  The patient will be discharged to [facility] this afternoon.  AN/NUANCE D:04/25/2018 T:04/25/2018 JOB:[id number]

## 2018-04-25 NOTE — Progress Notes (Signed)
Report called to [facility] . Will continue to monitor patient.

## 2019-12-11 ENCOUNTER — Inpatient Hospital Stay (HOSPITAL_COMMUNITY)
Admission: EM | Admit: 2019-12-11 | Discharge: 2019-12-22 | DRG: 377 | Disposition: A | Discharge status: Home Health | Payer: Medicare Other | Attending: Cardiology | Admitting: Cardiology

## 2019-12-11 ENCOUNTER — Emergency Department (HOSPITAL_COMMUNITY): Payer: Medicare Other

## 2019-12-11 DIAGNOSIS — J189 Pneumonia, unspecified organism: Secondary | ICD-10-CM

## 2019-12-11 DIAGNOSIS — R0982 Postnasal drip: Secondary | ICD-10-CM | POA: Diagnosis present

## 2019-12-11 DIAGNOSIS — K5909 Other constipation: Secondary | ICD-10-CM | POA: Diagnosis present

## 2019-12-11 DIAGNOSIS — D638 Anemia in other chronic diseases classified elsewhere: Secondary | ICD-10-CM | POA: Diagnosis present

## 2019-12-11 DIAGNOSIS — Z20822 Contact with and (suspected) exposure to covid-19: Secondary | ICD-10-CM | POA: Diagnosis not present

## 2019-12-11 DIAGNOSIS — K922 Gastrointestinal hemorrhage, unspecified: Secondary | ICD-10-CM | POA: Diagnosis not present

## 2019-12-11 DIAGNOSIS — E861 Hypovolemia: Secondary | ICD-10-CM | POA: Diagnosis present

## 2019-12-11 DIAGNOSIS — I469 Cardiac arrest, cause unspecified: Secondary | ICD-10-CM | POA: Diagnosis present

## 2019-12-11 DIAGNOSIS — J90 Pleural effusion, not elsewhere classified: Secondary | ICD-10-CM | POA: Diagnosis present

## 2019-12-11 DIAGNOSIS — R55 Syncope and collapse: Secondary | ICD-10-CM | POA: Diagnosis not present

## 2019-12-11 DIAGNOSIS — I472 Ventricular tachycardia: Secondary | ICD-10-CM | POA: Diagnosis not present

## 2019-12-11 DIAGNOSIS — R001 Bradycardia, unspecified: Secondary | ICD-10-CM | POA: Diagnosis present

## 2019-12-11 DIAGNOSIS — E8770 Fluid overload, unspecified: Secondary | ICD-10-CM | POA: Diagnosis not present

## 2019-12-11 DIAGNOSIS — Z79890 Hormone replacement therapy (postmenopausal): Secondary | ICD-10-CM

## 2019-12-11 DIAGNOSIS — Z7982 Long term (current) use of aspirin: Secondary | ICD-10-CM

## 2019-12-11 DIAGNOSIS — Z885 Allergy status to narcotic agent status: Secondary | ICD-10-CM | POA: Diagnosis not present

## 2019-12-11 DIAGNOSIS — N179 Acute kidney failure, unspecified: Secondary | ICD-10-CM | POA: Diagnosis present

## 2019-12-11 DIAGNOSIS — E876 Hypokalemia: Secondary | ICD-10-CM | POA: Diagnosis present

## 2019-12-11 DIAGNOSIS — D500 Iron deficiency anemia secondary to blood loss (chronic): Secondary | ICD-10-CM | POA: Diagnosis present

## 2019-12-11 DIAGNOSIS — R195 Other fecal abnormalities: Secondary | ICD-10-CM | POA: Diagnosis not present

## 2019-12-11 DIAGNOSIS — Z880 Allergy status to penicillin: Secondary | ICD-10-CM | POA: Diagnosis not present

## 2019-12-11 DIAGNOSIS — Z85831 Personal history of malignant neoplasm of soft tissue: Secondary | ICD-10-CM | POA: Diagnosis not present

## 2019-12-11 DIAGNOSIS — D649 Anemia, unspecified: Secondary | ICD-10-CM

## 2019-12-11 DIAGNOSIS — Z79899 Other long term (current) drug therapy: Secondary | ICD-10-CM

## 2019-12-11 DIAGNOSIS — E785 Hyperlipidemia, unspecified: Secondary | ICD-10-CM | POA: Diagnosis present

## 2019-12-11 DIAGNOSIS — I10 Essential (primary) hypertension: Secondary | ICD-10-CM | POA: Diagnosis present

## 2019-12-11 DIAGNOSIS — E039 Hypothyroidism, unspecified: Secondary | ICD-10-CM | POA: Diagnosis not present

## 2019-12-11 DIAGNOSIS — E860 Dehydration: Secondary | ICD-10-CM | POA: Diagnosis present

## 2019-12-11 DIAGNOSIS — Z8701 Personal history of pneumonia (recurrent): Secondary | ICD-10-CM

## 2019-12-11 HISTORY — DX: Anemia, unspecified: D64.9

## 2019-12-11 HISTORY — DX: Hypothyroidism, unspecified: E03.9

## 2019-12-11 LAB — URINALYSIS, ROUTINE W REFLEX MICROSCOPIC
Bacteria, UA: NONE SEEN
Bilirubin Urine: NEGATIVE
Glucose, UA: NEGATIVE mg/dL
Hgb urine dipstick: NEGATIVE
Ketones, ur: NEGATIVE mg/dL
Leukocytes,Ua: NEGATIVE
Nitrite: NEGATIVE
Protein, ur: 30 mg/dL — AB
Specific Gravity, Urine: 1.018 (ref 1.005–1.030)
pH: 5 (ref 5.0–8.0)

## 2019-12-11 LAB — CBC WITH DIFFERENTIAL/PLATELET
Abs Immature Granulocytes: 0.21 10*3/uL — ABNORMAL HIGH (ref 0.00–0.07)
Basophils Absolute: 0 10*3/uL (ref 0.0–0.1)
Basophils Relative: 0 %
Eosinophils Absolute: 0.1 10*3/uL (ref 0.0–0.5)
Eosinophils Relative: 1 %
HCT: 14.4 % — ABNORMAL LOW (ref 36.0–46.0)
Hemoglobin: 4.1 g/dL — CL (ref 12.0–15.0)
Immature Granulocytes: 2 %
Lymphocytes Relative: 12 %
Lymphs Abs: 1.4 10*3/uL (ref 0.7–4.0)
MCH: 27.9 pg (ref 26.0–34.0)
MCHC: 28.5 g/dL — ABNORMAL LOW (ref 30.0–36.0)
MCV: 98 fL (ref 80.0–100.0)
Monocytes Absolute: 0.6 10*3/uL (ref 0.1–1.0)
Monocytes Relative: 5 %
Neutro Abs: 9.8 10*3/uL — ABNORMAL HIGH (ref 1.7–7.7)
Neutrophils Relative %: 80 %
Platelets: 201 10*3/uL (ref 150–400)
RBC: 1.47 MIL/uL — ABNORMAL LOW (ref 3.87–5.11)
RDW: 16.6 % — ABNORMAL HIGH (ref 11.5–15.5)
WBC: 12.2 10*3/uL — ABNORMAL HIGH (ref 4.0–10.5)
nRBC: 0 % (ref 0.0–0.2)

## 2019-12-11 LAB — PREPARE RBC (CROSSMATCH)

## 2019-12-11 LAB — POC SARS CORONAVIRUS 2 AG -  ED: SARS Coronavirus 2 Ag: NEGATIVE

## 2019-12-11 LAB — COMPREHENSIVE METABOLIC PANEL
ALT: 9 U/L (ref 0–44)
AST: 14 U/L — ABNORMAL LOW (ref 15–41)
Albumin: 2.7 g/dL — ABNORMAL LOW (ref 3.5–5.0)
Alkaline Phosphatase: 35 U/L — ABNORMAL LOW (ref 38–126)
Anion gap: 9 (ref 5–15)
BUN: 24 mg/dL — ABNORMAL HIGH (ref 8–23)
CO2: 18 mmol/L — ABNORMAL LOW (ref 22–32)
Calcium: 8.9 mg/dL (ref 8.9–10.3)
Chloride: 114 mmol/L — ABNORMAL HIGH (ref 98–111)
Creatinine, Ser: 1.39 mg/dL — ABNORMAL HIGH (ref 0.44–1.00)
GFR calc Af Amer: 39 mL/min — ABNORMAL LOW (ref >60)
GFR calc non Af Amer: 33 mL/min — ABNORMAL LOW (ref >60)
Glucose, Bld: 137 mg/dL — ABNORMAL HIGH (ref 70–99)
Potassium: 4 mmol/L (ref 3.5–5.1)
Sodium: 141 mmol/L (ref 135–145)
Total Bilirubin: 0.6 mg/dL (ref 0.3–1.2)
Total Protein: 5.5 g/dL — ABNORMAL LOW (ref 6.5–8.1)

## 2019-12-11 LAB — PROTIME-INR
INR: 1.3 — ABNORMAL HIGH (ref 0.8–1.2)
Prothrombin Time: 16 seconds — ABNORMAL HIGH (ref 11.4–15.2)

## 2019-12-11 LAB — SARS CORONAVIRUS 2 (TAT 6-24 HRS): SARS Coronavirus 2: NEGATIVE

## 2019-12-11 LAB — CBG MONITORING, ED: Glucose-Capillary: 138 mg/dL — ABNORMAL HIGH (ref 70–99)

## 2019-12-11 LAB — LACTIC ACID, PLASMA
Lactic Acid, Venous: 2.3 mmol/L (ref 0.5–1.9)
Lactic Acid, Venous: 2.2 mmol/L (ref 0.5–1.9)

## 2019-12-11 LAB — TROPONIN I (HIGH SENSITIVITY)
Troponin I (High Sensitivity): 10 ng/L (ref <18)
Troponin I (High Sensitivity): 17 ng/L (ref <18)

## 2019-12-11 LAB — APTT: aPTT: 29 seconds (ref 24–36)

## 2019-12-11 MED ORDER — SODIUM CHLORIDE 0.9 % IV BOLUS
1000.0000 mL | Freq: Once | INTRAVENOUS | Status: AC
  Administered 2019-12-11: 1000 mL via INTRAVENOUS

## 2019-12-11 MED ORDER — PANTOPRAZOLE SODIUM 40 MG IV SOLR
40.0000 mg | Freq: Two times a day (BID) | INTRAVENOUS | Status: DC
  Administered 2019-12-11 – 2019-12-17 (×12): 40 mg via INTRAVENOUS
  Filled 2019-12-11 (×12): qty 40

## 2019-12-11 MED ORDER — LEVOTHYROXINE SODIUM 50 MCG PO TABS
50.0000 ug | ORAL_TABLET | Freq: Every day | ORAL | Status: DC
  Filled 2019-12-11: qty 1

## 2019-12-11 MED ORDER — SODIUM CHLORIDE 0.9 % IV SOLN
500.0000 mg | INTRAVENOUS | Status: AC
  Administered 2019-12-12 – 2019-12-16 (×5): 500 mg via INTRAVENOUS
  Filled 2019-12-11 (×5): qty 500

## 2019-12-11 MED ORDER — SODIUM CHLORIDE 0.9 % IV SOLN: 2.0000 g | INTRAVENOUS | Status: DC

## 2019-12-11 MED ORDER — SODIUM CHLORIDE 0.9 % IV SOLN: INTRAVENOUS | Status: DC

## 2019-12-11 MED ORDER — SODIUM CHLORIDE 0.9% IV SOLUTION: Freq: Once | INTRAVENOUS | Status: AC

## 2019-12-11 MED ORDER — LEVALBUTEROL HCL 1.25 MG/0.5ML IN NEBU
1.2500 mg | INHALATION_SOLUTION | Freq: Three times a day (TID) | RESPIRATORY_TRACT | Status: DC
  Administered 2019-12-11 – 2019-12-13 (×5): 1.25 mg via RESPIRATORY_TRACT
  Filled 2019-12-11 (×6): qty 0.5

## 2019-12-11 MED ORDER — SODIUM CHLORIDE 0.9 % IV SOLN
1.0000 g | Freq: Once | INTRAVENOUS | Status: AC
  Administered 2019-12-11: 1 g via INTRAVENOUS
  Filled 2019-12-11: qty 10

## 2019-12-11 MED ORDER — SODIUM CHLORIDE 0.9 % IV SOLN
10.0000 mL/h | Freq: Once | INTRAVENOUS | Status: AC
  Administered 2019-12-11: 10 mL/h via INTRAVENOUS

## 2019-12-11 MED ORDER — SODIUM CHLORIDE 0.9 % IV SOLN
500.0000 mg | Freq: Once | INTRAVENOUS | Status: AC
  Administered 2019-12-11: 500 mg via INTRAVENOUS
  Filled 2019-12-11: qty 500

## 2019-12-11 MED ORDER — POLYETHYLENE GLYCOL 3350 17 G PO PACK
17.0000 g | PACK | Freq: Every day | ORAL | Status: DC | PRN
  Filled 2019-12-11: qty 1

## 2019-12-11 NOTE — ED Provider Notes (Signed)
4:09 PM Patient awaiting bed after initial presentation for postarrest.  Care of the patient assumed at signout. As called bedside with patient's hemoglobin resulted with a value of 4.1, critically abnormal. Discussed this with the patient's daughter.  Patient has very poor hearing. Patient had type and screen, and his family is amenable to transfusion for critically abnormal hemoglobin value. I also discussed this abnormality with the patient's cardiologist (at bedside) who is admitting the patient following her episode of cardiac arrest. On exam patient is in similar condition, hemodynamically unremarkable.  Vitals:   12/11/19 1530 12/11/19 1545  BP: (!) 121/43 (!) 119/43  Pulse: 83 84  Resp: (!) 9 (!) 9  Temp:    SpO2: 97% 97%   CRITICAL CARE Performed by: Carmin Muskrat Total critical care time: 35 minutes Critical care time was exclusive of separately billable procedures and treating other patients. Critical care was necessary to treat or prevent imminent or life-threatening deterioration. Critical care was time spent personally by me on the following activities: development of treatment plan with patient and/or surrogate as well as nursing, discussions with consultants, evaluation of patient's response to treatment, examination of patient, obtaining history from patient or surrogate, ordering and performing treatments and interventions, ordering and review of laboratory studies, ordering and review of radiographic studies, pulse oximetry and re-evaluation of patient's condition.    Carmin Muskrat, MD 12/11/19 [phone]

## 2019-12-11 NOTE — ED Triage Notes (Signed)
Pt BIB [facility], post CPR. Pt found unresponsive by daughter, A&Ox4 when EMS arrived, pt went to bathroom and became unresponsive and apneic again, 1 round CPR initiated. Pt responsive, A&Ox4 on arrival. Pt VS stable, 97% on RA

## 2019-12-11 NOTE — ED Provider Notes (Signed)
[facility] EMERGENCY DEPARTMENT Provider Note   CSN: [id number] Arrival date & time: 12/11/19  1316     History No chief complaint on file.   Monique Lin is a 86 y.o. female history of hypertension, previous pneumonia here presenting with cardiac arrest. Patient has been coughing for the last several days Apparently she had a negative Covid test yesterday and she is only at home with her daughter.  Both her kids and herself tested negative yesterday for Covid.  She felt weak today and had tired.  Her daughter noticed very thready pulse and apparently she turned blue and lost pulses and patient apparently went into cardiac arrest. The daughter performed chest compressions for about 5 minutes.  EMS was called and by the time they got there, she was alert and oriented.  EMS initially did not want to bring her to the ER.  She then walked to the bathroom and then had a syncopal episode and apparently lost pulses again.  They performed 1 round of chest compressions and she woke up again .  No meds were given prior to arrival.  She was admitted about 2 years ago for pneumonia.  The history is provided by the patient.       Past Medical History:  Diagnosis Date  . Bronchitis   . Hypertension   . Leg pain   . Stomach tumor (benign)     Patient Active Problem List   Diagnosis Date Noted  . CAP (community acquired pneumonia)   . Empyema ([place])   . Pleural effusion, left   . Left lower lobe pneumonia 04/01/2018  . Bronchitis     Past Surgical History:  Procedure Laterality Date  . IR CATHETER TUBE CHANGE  04/14/2018  . IR THORACENTESIS ASP PLEURAL SPACE W/IMG GUIDE  04/05/2018  . NASAL SINUS SURGERY    . stomach tumor removal       OB History   No obstetric history on file.     No family history on file.  Social History   Tobacco Use  . Smoking status: Never Smoker  . Smokeless tobacco: Never Used  Substance Use Topics  . Alcohol use: No  . Drug use: No     Home Medications Prior to Admission medications   Medication Sig Start Date End Date Taking? Authorizing Provider  albuterol (PROVENTIL) (2.5 MG/3ML) 0.083% nebulizer solution Take 2.5 mg by nebulization every 6 (six) hours as needed for wheezing.   Yes [provider]  albuterol (VENTOLIN HFA) 108 (90 Base) MCG/ACT inhaler Inhale 1 puff into the lungs every 6 (six) hours as needed for wheezing or shortness of breath.   Yes [provider]  amLODipine (NORVASC) 5 MG tablet Take 5 mg by mouth daily.   Yes [provider]  aspirin EC 81 MG tablet Take 81 mg by mouth daily.   Yes [provider]  atorvastatin (LIPITOR) 20 MG tablet Take 20 mg by mouth daily.   Yes [provider]  carvedilol (COREG) 25 MG tablet Take 0.5 tablets (12.5 mg total) by mouth 2 (two) times daily with a meal. Patient taking differently: Take 25 mg by mouth 2 (two) times daily.  04/25/18  Yes [name], MD  Cholecalciferol (VITAMIN D-3 PO) Take 1 capsule by mouth daily.   Yes [provider]  Cyanocobalamin (VITAMIN B-12 PO) Take 500 mcg by mouth daily.    Yes [provider]  fluticasone (FLONASE) 50 MCG/ACT nasal spray Place 2  sprays into the nose daily as needed for allergies or rhinitis.    Yes [provider]  FOLIC ACID PO Take 1 tablet by mouth daily.    Yes [provider]  gabapentin (NEURONTIN) 300 MG capsule Take 300 mg by mouth 3 (three) times daily.   Yes [provider]  levothyroxine (SYNTHROID, LEVOTHROID) 50 MCG tablet Take 50 mcg by mouth daily.   Yes [provider]  polyethylene glycol (MIRALAX / GLYCOLAX) packet Take 17 g by mouth daily as needed for moderate constipation. 04/25/18  Yes [name], MD    Allergies    Codeine and Penicillins  Review of Systems   Review of Systems  Unable to perform ROS: Acuity of condition  Neurological: Positive for weakness.  All other systems reviewed  and are negative.   Physical Exam Updated Vital Signs BP (!) 135/51 (BP Location: Left Arm)   Temp (!) 96.9 F (36.1 C) (Rectal)   Resp 13   SpO2 98%   Physical Exam Vitals and nursing note reviewed.  Constitutional:      Comments: Tired   HENT:     Head: Normocephalic.     Mouth/Throat:     Mouth: Mucous membranes are moist.  Eyes:     Extraocular Movements: Extraocular movements intact.     Pupils: Pupils are equal, round, and reactive to light.  Cardiovascular:     Rate and Rhythm: Normal rate and regular rhythm.     Pulses: Normal pulses.     Heart sounds: Normal heart sounds.  Pulmonary:     Effort: Pulmonary effort is normal.     Comments: Crackles L base  Abdominal:     General: Abdomen is flat.     Palpations: Abdomen is soft.  Musculoskeletal:        General: Normal range of motion.     Cervical back: Normal range of motion.  Skin:    General: Skin is warm.     Capillary Refill: Capillary refill takes less than 2 seconds.  Neurological:     General: No focal deficit present.     Comments: Demented, moving all extremities   Psychiatric:        Mood and Affect: Mood normal.        Behavior: Behavior normal.     ED Results / Procedures / Treatments   Labs (all labs ordered are listed, but only abnormal results are displayed) Labs Reviewed  CBG MONITORING, ED - Abnormal; Notable for the following components:      Result Value   Glucose-Capillary 138 (*)    All other components within normal limits  CULTURE, BLOOD (ROUTINE X 2)  CULTURE, BLOOD (ROUTINE X 2)  URINE CULTURE  LACTIC ACID, PLASMA  LACTIC ACID, PLASMA  COMPREHENSIVE METABOLIC PANEL  CBC WITH DIFFERENTIAL/PLATELET  APTT  PROTIME-INR  URINALYSIS, ROUTINE W REFLEX MICROSCOPIC  I-STAT CHEM 8, ED  POC SARS CORONAVIRUS 2 AG -  ED  TROPONIN I (HIGH SENSITIVITY)    EKG EKG Interpretation  Date/Time:  Thursday December 11 2019 13:22:24 EST Ventricular Rate:  81 PR Interval:    QRS  Duration: 79 QT Interval:  399 QTC Calculation: 464 R Axis:   62 Text Interpretation: Sinus rhythm Nonspecific repol abnormality, diffuse leads Baseline wander in lead(s) V1 No significant change since last tracing Confirmed by Wandra Arthurs ([id number]) on 12/11/2019 1:39:25 PM   Radiology DG Chest Port 1 View  Result Date: 12/11/2019 CLINICAL DATA:  Altered mental status  EXAM: PORTABLE CHEST 1 VIEW COMPARISON:  04/24/2018 FINDINGS: Left lower lobe airspace disease. No pleural effusion or pneumothorax. Stable cardiomediastinal silhouette. Thoracic aortic atherosclerosis. No acute osseous abnormality. IMPRESSION: Left lower lobe airspace disease which may reflect atelectasis versus pneumonia. Electronically Signed   By: Kathreen Devoid   On: 12/11/2019 14:01    Procedures Procedures (including critical care time)  CRITICAL CARE Performed by: Wandra Arthurs   Total critical care time:30 minutes  Critical care time was exclusive of separately billable procedures and treating other patients.  Critical care was necessary to treat or prevent imminent or life-threatening deterioration.  Critical care was time spent personally by me on the following activities: development of treatment plan with patient and/or surrogate as well as nursing, discussions with consultants, evaluation of patient's response to treatment, examination of patient, obtaining history from patient or surrogate, ordering and performing treatments and interventions, ordering and review of laboratory studies, ordering and review of radiographic studies, pulse oximetry and re-evaluation of patient's condition.   Medications Ordered in ED Medications  sodium chloride 0.9 % bolus 1,000 mL (has no administration in time range)  cefTRIAXone (ROCEPHIN) 1 g in sodium chloride 0.9 % 100 mL IVPB (has no administration in time range)  azithromycin (ZITHROMAX) 500 mg in sodium chloride 0.9 % 250 mL IVPB (has no administration in time range)     ED Course  I have reviewed the triage vital signs and the nursing notes.  Pertinent labs & imaging results that were available during my care of the patient were reviewed by me and considered in my medical decision making (see chart for details).    MDM Rules/Calculators/A&P                      Monique Lin is a 86 y.o. female who presented with cardiac arrest. Patient is alert and oriented apparently had a syncopal episode and brief loss of pulse twice and coded twice at home with no meds given.  An extensive discussion with patient and daughter.  Patient wants to remain in full code and does want to be intubated and have chest compressions if her heart is to stop.  Will get labs and EKG and do sepsis work-up.    3:28 PM Labs showed lactate 2.8.  Chest x-ray showed pneumonia.  This is a recurrent problem for her.  Given broad-spectrum antibiotics.  Troponin pending.  I talked to Dr. [name] who will come and admit the patient.    Final Clinical Impression(s) / ED Diagnoses Final diagnoses:  None    Rx / DC Orders ED Discharge Orders    None       Drenda Freeze, MD 12/11/19 1530

## 2019-12-11 NOTE — H&P (Signed)
Monique Lin is an 86 y.o. female.   Chief Complaint: Generalized weakness/near syncope/questionable cardiac arrest HPI: Patient is 86 year old female with past medical history significant for hypertension, hyperlipidemia, hypothyroidism, history of gastrointestinal stromal tumor in the past, history of GI bleed in the past, anemia of chronic disease, history of left lower lobe pneumonia/empyema in the past came to ER by EMS following questionable cardiac arrest at home patient had CPR for 1 minute with no spontaneous circulation patient was noted to have generalized weakness and near syncopal episode while in the patient denies falling denies any seizure activity.  Denies any chest pain palpitations prior to questionable syncopal episode.  As per daughter patient has been having chronic constipation and black stools for last few days associated with generalized weakness in the ED patient was noted to have hemoglobin of 4.1.  Chest x-ray showed left lower lobe infiltrate with mildly elevated WBCs.  Patient denies any fever or chills was tested recently for COVID-19 which was negative patient has chronic cough associated with postnasal drip.  Pancultures have been obtained in the ED and was started on Rocephin and Zithromax.  Awaiting packed RBC blood transfusion.  Past Medical History:  Diagnosis Date  . Bronchitis   . Hypertension   . Leg pain   . Stomach tumor (benign)     Past Surgical History:  Procedure Laterality Date  . IR CATHETER TUBE CHANGE  04/14/2018  . IR THORACENTESIS ASP PLEURAL SPACE W/IMG GUIDE  04/05/2018  . NASAL SINUS SURGERY    . stomach tumor removal      No family history on file. Social History:  reports that she has never smoked. She has never used smokeless tobacco. She reports that she does not drink alcohol or use drugs.  Allergies:  Allergies  Allergen Reactions  . Codeine Other (See Comments)    Stomach cramps  . Penicillins Other (See Comments)    "Bad stomach  cramps" Has patient had a PCN reaction causing immediate rash, facial/tongue/throat swelling, SOB or lightheadedness with hypotension: Unk Has patient had a PCN reaction causing severe rash involving mucus membranes or skin necrosis: Unk Has patient had a PCN reaction that required hospitalization: Unk Has patient had a PCN reaction occurring within the last 10 years: No If all of the above answers are "NO", then may proceed with Cephalosporin use.      (Not in a hospital admission)   Results for orders placed or performed during the hospital encounter of 12/11/19 (from the past 48 hour(s))  Comprehensive metabolic panel     Status: Abnormal   Collection Time: 12/11/19  1:25 PM  Result Value Ref Range   Sodium 141 135 - 145 mmol/L   Potassium 4.0 3.5 - 5.1 mmol/L   Chloride 114 (H) 98 - 111 mmol/L   CO2 18 (L) 22 - 32 mmol/L   Glucose, Bld 137 (H) 70 - 99 mg/dL   BUN 24 (H) 8 - 23 mg/dL   Creatinine, Ser 1.39 (H) 0.44 - 1.00 mg/dL   Calcium 8.9 8.9 - 10.3 mg/dL   Total Protein 5.5 (L) 6.5 - 8.1 g/dL   Albumin 2.7 (L) 3.5 - 5.0 g/dL   AST 14 (L) 15 - 41 U/L   ALT 9 0 - 44 U/L   Alkaline Phosphatase 35 (L) 38 - 126 U/L   Total Bilirubin 0.6 0.3 - 1.2 mg/dL   GFR calc non Af Amer 33 (L) >60 mL/min   GFR calc Af Amer 39 (  L) >60 mL/min   Anion gap 9 5 - 15    Comment: Performed at [facility] [address] [place], [place] [address]  APTT     Status: None   Collection Time: 12/11/19  1:25 PM  Result Value Ref Range   aPTT 29 24 - 36 seconds    Comment: Performed at [facility] [address], [place], [place] [address]  Protime-INR     Status: Abnormal   Collection Time: 12/11/19  1:25 PM  Result Value Ref Range   Prothrombin Time 16.0 (H) 11.4 - 15.2 seconds   INR 1.3 (H) 0.8 - 1.2    Comment: (NOTE) INR goal varies based on device and disease states. Performed at [facility], [place] [address], [place], [place] [address]   Troponin I (High  Sensitivity)     Status: None   Collection Time: 12/11/19  1:25 PM  Result Value Ref Range   Troponin I (High Sensitivity) 10 <18 ng/L    Comment: (NOTE) Elevated high sensitivity troponin I (hsTnI) values and significant  changes across serial measurements may suggest ACS but many other  chronic and acute conditions are known to elevate hsTnI results.  Refer to the "Links" section for chest pain algorithms and additional  guidance. Performed at [facility], [place] [address] [place], [place] [address]   Lactic acid, plasma     Status: Abnormal   Collection Time: 12/11/19  1:30 PM  Result Value Ref Range   Lactic Acid, Venous 2.3 (HH) 0.5 - 1.9 mmol/L    Comment: CRITICAL RESULT CALLED TO, READ BACK BY AND VERIFIED WITH: C.KOPP,RN 1455 12/11/2019 CLARK,S Performed at [facility], [place] [address] [place], [place] [address]   CBG monitoring, ED     Status: Abnormal   Collection Time: 12/11/19  1:30 PM  Result Value Ref Range   Glucose-Capillary 138 (H) 70 - 99 mg/dL  Urinalysis, Routine w reflex microscopic     Status: Abnormal   Collection Time: 12/11/19  1:37 PM  Result Value Ref Range   Color, Urine YELLOW YELLOW   APPearance CLEAR CLEAR   Specific Gravity, Urine 1.018 1.005 - 1.030   pH 5.0 5.0 - 8.0   Glucose, UA NEGATIVE NEGATIVE mg/dL   Hgb urine dipstick NEGATIVE NEGATIVE   Bilirubin Urine NEGATIVE NEGATIVE   Ketones, ur NEGATIVE NEGATIVE mg/dL   Protein, ur 30 (A) NEGATIVE mg/dL   Nitrite NEGATIVE NEGATIVE   Leukocytes,Ua NEGATIVE NEGATIVE   RBC / HPF 0-5 0 - 5 RBC/hpf   WBC, UA 0-5 0 - 5 WBC/hpf   Bacteria, UA NONE SEEN NONE SEEN   Squamous Epithelial / LPF 0-5 0 - 5   Mucus PRESENT    Hyaline Casts, UA PRESENT     Comment: Performed at [facility], [place] [address], [place], [place] [address]  Lactic acid, plasma     Status: Abnormal   Collection Time: 12/11/19  3:25 PM  Result Value Ref Range   Lactic Acid, Venous 2.2 (HH) 0.5 - 1.9 mmol/L     Comment: CRITICAL VALUE NOTED.  VALUE IS CONSISTENT WITH PREVIOUSLY REPORTED AND CALLED VALUE. Performed at [facility], [place] [address], [place], [place] [address]   Troponin I (High Sensitivity)     Status: None   Collection Time: 12/11/19  3:26 PM  Result Value Ref Range   Troponin I (High Sensitivity) 17 <18 ng/L  Comment: (NOTE) Elevated high sensitivity troponin I (hsTnI) values and significant  changes across serial measurements may suggest ACS but many other  chronic and acute conditions are known to elevate hsTnI results.  Refer to the "Links" section for chest pain algorithms and additional  guidance. Performed at [facility], [place] [address], [place], [place] [address]   CBC with Differential     Status: Abnormal   Collection Time: 12/11/19  3:26 PM  Result Value Ref Range   WBC 12.2 (H) 4.0 - 10.5 K/uL   RBC 1.47 (L) 3.87 - 5.11 MIL/uL   Hemoglobin 4.1 (LL) 12.0 - 15.0 g/dL    Comment: This critical result has verified and been called to G KOPT,RN by Gerrie Nordmann on 01 07 2021 at 1556, and has been read back.    HCT 14.4 (L) 36.0 - 46.0 %   MCV 98.0 80.0 - 100.0 fL   MCH 27.9 26.0 - 34.0 pg   MCHC 28.5 (L) 30.0 - 36.0 g/dL   RDW 16.6 (H) 11.5 - 15.5 %   Platelets 201 150 - 400 K/uL   nRBC 0.0 0.0 - 0.2 %   Neutrophils Relative % 80 %   Neutro Abs 9.8 (H) 1.7 - 7.7 K/uL   Lymphocytes Relative 12 %   Lymphs Abs 1.4 0.7 - 4.0 K/uL   Monocytes Relative 5 %   Monocytes Absolute 0.6 0.1 - 1.0 K/uL   Eosinophils Relative 1 %   Eosinophils Absolute 0.1 0.0 - 0.5 K/uL   Basophils Relative 0 %   Basophils Absolute 0.0 0.0 - 0.1 K/uL   Immature Granulocytes 2 %   Abs Immature Granulocytes 0.21 (H) 0.00 - 0.07 K/uL    Comment: Performed at [facility] [address], [place], [place] [address]  POC SARS Coronavirus 2 Ag-ED - Nasal Swab (BD Veritor Kit)     Status: None   Collection Time: 12/11/19  3:51 PM  Result Value Ref Range   SARS Coronavirus  2 Ag NEGATIVE NEGATIVE    Comment: (NOTE) SARS-CoV-2 antigen NOT DETECTED.  Negative results are presumptive.  Negative results do not preclude SARS-CoV-2 infection and should not be used as the sole basis for treatment or other patient management decisions, including infection  control decisions, particularly in the presence of clinical signs and  symptoms consistent with COVID-19, or in those who have been in contact with the virus.  Negative results must be combined with clinical observations, patient history, and epidemiological information. The expected result is Negative. Fact Sheet for Patients: PodPark.tn Fact Sheet for Healthcare Providers: GiftContent.is This test is not yet approved or cleared by the [place] FDA and  has been authorized for detection and/or diagnosis of SARS-CoV-2 by FDA under an Emergency Use Authorization (EUA).  This EUA will remain in effect (meaning this test can be used) for the duration of  the COVID-19 de claration under Section 564(b)(1) of the Act, 21 U.S.C. section 360bbb-3(b)(1), unless the authorization is terminated or revoked sooner.   Type and screen [facility]     Status: None (Preliminary result)   Collection Time: 12/11/19  4:11 PM  Result Value Ref Range   ABO/RH(D) PENDING    Antibody Screen PENDING    Sample Expiration      12/14/2019,2359 Performed at [facility], [place] [address], [place], [place] [address]    DG Chest Port 1 View  Result Date: 12/11/2019 CLINICAL DATA:  Altered  mental status EXAM: PORTABLE CHEST 1 VIEW COMPARISON:  04/24/2018 FINDINGS: Left lower lobe airspace disease. No pleural effusion or pneumothorax. Stable cardiomediastinal silhouette. Thoracic aortic atherosclerosis. No acute osseous abnormality. IMPRESSION: Left lower lobe airspace disease which may reflect atelectasis versus pneumonia. Electronically Signed   By: Kathreen Devoid   On: 12/11/2019 14:01    Review of Systems  Constitutional: Positive for fatigue. Negative for fever.  HENT: Positive for congestion and postnasal drip.   Respiratory: Positive for cough.   Cardiovascular: Negative for chest pain and palpitations.  Gastrointestinal: Positive for constipation. Negative for abdominal pain.  Genitourinary: Negative for dysuria.  Neurological: Positive for dizziness. Negative for speech difficulty and headaches.    Blood pressure (!) 119/43, pulse 84, temperature (!) 96.9 F (36.1 C), temperature source Rectal, resp. rate (!) 9, height 5' 2.5" (1.588 m), weight 79.4 kg, SpO2 97 %. Physical Exam  Constitutional: She is oriented to person, place, and time.  Eyes: Left eye exhibits discharge. No scleral icterus.  Conjunctiva pale sclera nonicteric  Neck: No JVD present. No tracheal deviation present. No thyromegaly present.  Cardiovascular: Normal rate and regular rhythm.  Murmur (Soft systolic murmur noted) heard. Respiratory:  Decreased breath sound at bases with left basilar rhonchi noted  GI: Soft. Bowel sounds are normal. She exhibits no distension. There is no abdominal tenderness. There is no rebound.  Musculoskeletal:        General: No tenderness, deformity or edema.  Neurological: She is alert and oriented to person, place, and time.     Assessment/Plan Status post questionable cardiac arrest/near syncope Rule out cardiac arrhythmias Acute upper GI bleed Acute on chronic anemia secondary to above Left lower lobe pneumonia Hypertension Hyperlipidemia History of gastric stromal tumor resection in the past History of GI bleed in the past Hypothyroidism Plan As per orders Discussed with patient and her daughter regarding [place] wants everything possible to be done agrees for packed RBC blood transfusion but agrees for no endoscopy and formal GI evaluation. [name], MD 12/11/2019, 4:26 PM

## 2019-12-12 ENCOUNTER — Other Ambulatory Visit: Payer: Self-pay

## 2019-12-12 ENCOUNTER — Encounter (HOSPITAL_COMMUNITY): Payer: Self-pay | Admitting: Cardiology

## 2019-12-12 LAB — CBC
HCT: 24.1 % — ABNORMAL LOW (ref 36.0–46.0)
Hemoglobin: 7.7 g/dL — ABNORMAL LOW (ref 12.0–15.0)
MCH: 28.3 pg (ref 26.0–34.0)
MCHC: 32 g/dL (ref 30.0–36.0)
MCV: 88.6 fL (ref 80.0–100.0)
Platelets: 190 10*3/uL (ref 150–400)
RBC: 2.72 MIL/uL — ABNORMAL LOW (ref 3.87–5.11)
RDW: 15.3 % (ref 11.5–15.5)
WBC: 11.1 10*3/uL — ABNORMAL HIGH (ref 4.0–10.5)
nRBC: 0.2 % (ref 0.0–0.2)

## 2019-12-12 LAB — BASIC METABOLIC PANEL
Anion gap: 13 (ref 5–15)
BUN: 20 mg/dL (ref 8–23)
CO2: 18 mmol/L — ABNORMAL LOW (ref 22–32)
Calcium: 8.8 mg/dL — ABNORMAL LOW (ref 8.9–10.3)
Chloride: 108 mmol/L (ref 98–111)
Creatinine, Ser: 1.23 mg/dL — ABNORMAL HIGH (ref 0.44–1.00)
GFR calc Af Amer: 45 mL/min — ABNORMAL LOW (ref >60)
GFR calc non Af Amer: 39 mL/min — ABNORMAL LOW (ref >60)
Glucose, Bld: 90 mg/dL (ref 70–99)
Potassium: 3.5 mmol/L (ref 3.5–5.1)
Sodium: 139 mmol/L (ref 135–145)

## 2019-12-12 LAB — URINE CULTURE: Culture: NO GROWTH

## 2019-12-12 LAB — HIV ANTIBODY (ROUTINE TESTING W REFLEX): HIV Screen 4th Generation wRfx: NONREACTIVE

## 2019-12-12 LAB — MRSA PCR SCREENING: MRSA by PCR: POSITIVE — AB

## 2019-12-12 MED ORDER — FUROSEMIDE 10 MG/ML IJ SOLN
40.0000 mg | Freq: Once | INTRAMUSCULAR | Status: AC
  Administered 2019-12-12: 40 mg via INTRAVENOUS
  Filled 2019-12-12: qty 4

## 2019-12-12 MED ORDER — ACETAMINOPHEN 500 MG PO TABS
500.0000 mg | ORAL_TABLET | Freq: Four times a day (QID) | ORAL | Status: DC | PRN
  Administered 2019-12-12 – 2019-12-16 (×8): 500 mg via ORAL
  Filled 2019-12-12 (×9): qty 1

## 2019-12-12 MED ORDER — CHLORHEXIDINE GLUCONATE CLOTH 2 % EX PADS
6.0000 | MEDICATED_PAD | Freq: Every day | CUTANEOUS | Status: AC
  Administered 2019-12-15 – 2019-12-17 (×3): 6 via TOPICAL

## 2019-12-12 MED ORDER — LEVOTHYROXINE SODIUM 50 MCG PO TABS
50.0000 ug | ORAL_TABLET | Freq: Every day | ORAL | Status: DC
  Administered 2019-12-12 – 2019-12-13 (×2): 50 ug via ORAL
  Filled 2019-12-12 (×2): qty 1

## 2019-12-12 MED ORDER — FERROUS SULFATE 325 (65 FE) MG PO TABS
325.0000 mg | ORAL_TABLET | Freq: Three times a day (TID) | ORAL | Status: DC
  Administered 2019-12-12 – 2019-12-18 (×18): 325 mg via ORAL
  Filled 2019-12-12 (×19): qty 1

## 2019-12-12 MED ORDER — MUPIROCIN 2 % EX OINT
1.0000 "application " | TOPICAL_OINTMENT | Freq: Two times a day (BID) | CUTANEOUS | Status: AC
  Administered 2019-12-12 – 2019-12-16 (×10): 1 via NASAL
  Filled 2019-12-12: qty 22

## 2019-12-12 NOTE — ED Notes (Signed)
Report given to 2C RN. All questions answered. 

## 2019-12-12 NOTE — ED Notes (Signed)
Dr. Zenia Resides answering service paged per RN request

## 2019-12-12 NOTE — Progress Notes (Signed)
Subjective:  Patient denies any chest pain.  States breathing has improved overall feels better after the packed RBC transfusion.  Complains of vague abdominal pain and chronic constipation.  Objective:  Vital Signs in the last 24 hours: Temp:  [96.9 F (36.1 C)-99.6 F (37.6 C)] 98.3 F (36.8 C) (01/08 0818) Pulse Rate:  [79-95] 84 (01/08 0818) Resp:  [9-24] 15 (01/08 0347) BP: (98-149)/(30-81) 116/81 (01/08 0818) SpO2:  [94 %-100 %] 100 % (01/08 0915) Weight:  [79.4 kg] 79.4 kg (01/07 1528)  Intake/Output from previous day: 01/07 0701 - 01/08 0700 In: 1695 [P.O.:30; Blood:315; IV Piggyback:1350] Out: 1000 [Urine:1000] Intake/Output from this shift: Total I/O In: 230 [P.O.:230] Out: 1000 [Urine:1000]  Physical Exam: Neck: moderate anterior cervical adenopathy, no adenopathy, no carotid bruit, no JVD, supple, symmetrical, trachea midline and thyroid not enlarged, symmetric, no tenderness/mass/nodules Lungs: decreased breath sounds at bases with occasional rhonchi left base Abdomen: soft, bowel sounds present.  Mild generalized tenderness.  No guarding or rebound tenderness. Extremities: extremities normal, atraumatic, no cyanosis or edema  Lab Results: Recent Labs    12/11/19 1526 12/12/19 0423  WBC 12.2* 11.1*  HGB 4.1* 7.7*  PLT 201 190   Recent Labs    12/11/19 1325 12/12/19 0423  NA 141 139  K 4.0 3.5  CL 114* 108  CO2 18* 18*  GLUCOSE 137* 90  BUN 24* 20  CREATININE 1.39* 1.23*   No results for input(s): TROPONINI in the last 72 hours.  Invalid input(s): CK, MB Hepatic Function Panel Recent Labs    12/11/19 1325  PROT 5.5*  ALBUMIN 2.7*  AST 14*  ALT 9  ALKPHOS 35*  BILITOT 0.6   No results for input(s): CHOL in the last 72 hours. No results for input(s): PROTIME in the last 72 hours.  Imaging: Imaging results have been reviewed and DG Chest Port 1 View  Result Date: 12/11/2019 CLINICAL DATA:  Altered mental status EXAM: PORTABLE CHEST 1 VIEW  COMPARISON:  04/24/2018 FINDINGS: Left lower lobe airspace disease. No pleural effusion or pneumothorax. Stable cardiomediastinal silhouette. Thoracic aortic atherosclerosis. No acute osseous abnormality. IMPRESSION: Left lower lobe airspace disease which may reflect atelectasis versus pneumonia. Electronically Signed   By: Kathreen Devoid   On: 12/11/2019 14:01    Cardiac Studies:  Assessment/Plan:  Status post questionable cardiac arrest/near syncope Rule out cardiac arrhythmias Acute upper GI bleed Acute on chronic anemia secondary to above Left lower lobe pneumonia Hypertension Hyperlipidemia History of gastric stromal tumor resection in the past History of GI bleed in the past Hypothyroidism Plan Continue present management. PT consult. DC IV fluid changed to [facility]. Rx for constipation. Dr.Kadakia on call for me for weekend.  Check labs in a.m.  LOS: 1 day    [name] 12/12/2019, 10:29 AM

## 2019-12-12 NOTE — Progress Notes (Signed)
Patient via stretcher from ED. Has saline lock to right AC, saline lock to left AC, and saline lock to right wrist. No blood infusing when patient arrived. Reported to this nurse that patient already received 2 units in the ED.  Patient assisted to bed x 3. Patient has congested cough. Placed on oxygen at 2L. Some SOB on movement. Patient c/o right sided back pain which is chronic for patient. Oral suction given to patient. IV Lasix given per MD order. Perwick intact and working appropriately. Tylenol given to patient for back pain as well as a heat pack. Patient abdomen distended, but soft, umbilical hernia noted. Patient noted to be resting with eyes closed. Integumentary assessment completed with Raina and Karlene. No open skin issues noted. No rashes noted. SCDs placed on bilateral legs. Alert and oriented x 4. Call light within reach.

## 2019-12-12 NOTE — Evaluation (Signed)
Physical Therapy Evaluation Patient Details Name: Monique Lin MRN: [id number] DOB: May 11, 1927 Today's Date: 12/12/2019   History of Present Illness  86 yo female admited to ED on 1/7 with x2 periods of unresponsiveness requiring CPR, suspect cardiac arrest vs syncopal episode. Pt found to have upper GIB. PMH includes bronchitis, HTN, HLD, GI tumor, empyema, CAD.  Clinical Impression   Pt presents with significant generalized weakness, difficulty performing mobility tasks, and decreased activity tolerance. Pt to benefit from acute PT to address deficits. Pt required mod-max assist for bed mobility and transfer to standing, tolerating x2 side steps only this session. VSS during session. PT recommending SNF level of care post-acutely based on eval.  PT to progress mobility as tolerated, and will continue to follow acutely.      Follow Up Recommendations SNF;Supervision/Assistance - 24 hour    Equipment Recommendations  None recommended by PT    Recommendations for Other Services       Precautions / Restrictions Precautions Precautions: Fall Restrictions Weight Bearing Restrictions: No      Mobility  Bed Mobility Overal bed mobility: Needs Assistance Bed Mobility: Supine to Sit;Sit to Supine     Supine to sit: Mod assist;HOB elevated Sit to supine: Max assist;HOB elevated   General bed mobility comments: mod-max assist for supine<>sit for trunk and LE management, scooting to and from EOB with use of bed pads. Very increased time and effort, especially with return to supine due to pt back pain.  Transfers Overall transfer level: Needs assistance Equipment used: Rolling walker (2 wheeled) Transfers: Sit to/from Stand Sit to Stand: From elevated surface;Mod assist         General transfer comment: Mod assist for power up, hip extension to upright, and steadying. Verbal cuing for hand placement when rising. Pt able to take 2 steps towards [facility] before  fatiguing.  Ambulation/Gait             General Gait Details: unable  Stairs            Wheelchair Mobility    Modified Rankin (Stroke Patients Only)       Balance Overall balance assessment: Needs assistance Sitting-balance support: Feet supported;Bilateral upper extremity supported Sitting balance-Leahy Scale: Fair Sitting balance - Comments: able to sit EOB unsupported by PT   Standing balance support: Bilateral upper extremity supported;During functional activity Standing balance-Leahy Scale: Poor Standing balance comment: reliant on external support in standing                             Pertinent Vitals/Pain Pain Assessment: Faces Faces Pain Scale: Hurts even more Pain Location: low back (chronic), abdomen Pain Descriptors / Indicators: Sore;Discomfort;Grimacing Pain Intervention(s): Limited activity within patient's tolerance;Monitored during session;Premedicated before session;Repositioned;Heat applied(pt educated to monitor hot pack for pt safety)    Home Living Family/patient expects to be discharged to:: Private residence Living Arrangements: Children Available Help at Discharge: Family;Available 24 hours/day(daughter that lives with pt works from home, other daughter lives across the street) Type of Home: House Home Access: Stairs to enter Entrance Stairs-Rails: Psychiatric nurse of Steps: [employer]: [employer] [place]: Environmental consultant - 2 wheels;Cane - single point;Bedside commode      Prior Function Level of Independence: Needs assistance   Gait / Transfers Assistance Needed: pt reports she hasn't recently needed anything for ambulation, a few days prior to admission when pt wasn't feeling well pt states she wasn't walking much  ADL's / Homemaking Assistance Needed: Pt reports she receives assist with cooking, cleaning, dressing, bathing from daughters        Hand Dominance   Dominant Hand: Right     Extremity/Trunk Assessment   Upper Extremity Assessment Upper Extremity Assessment: Generalized weakness    Lower Extremity Assessment Lower Extremity Assessment: Generalized weakness    Cervical / Trunk Assessment Cervical / Trunk Assessment: Normal  Communication   Communication: No difficulties  Cognition Arousal/Alertness: Awake/alert Behavior During Therapy: WFL for tasks assessed/performed Overall Cognitive Status: Within Functional Limits for tasks assessed                                        General Comments      Exercises     Assessment/Plan    PT Assessment Patient needs continued PT services  PT Problem List Decreased strength;Decreased mobility;Decreased safety awareness;Decreased balance;Decreased activity tolerance;Decreased knowledge of use of DME;Pain       PT Treatment Interventions DME instruction;Therapeutic activities;Gait training;Therapeutic exercise;Patient/family education;Balance training;Stair training;Functional mobility training    PT Goals (Current goals can be found in the Care Plan section)  Acute Rehab PT Goals Patient Stated Goal: decrease back and belly pain PT Goal Formulation: With patient Time For Goal Achievement: 12/26/19 Potential to Achieve Goals: Good    Frequency Min 2X/week   Barriers to discharge        Co-evaluation               AM-PAC PT "6 Clicks" Mobility  Outcome Measure Help needed turning from your back to your side while in a flat bed without using bedrails?: A Lot Help needed moving from lying on your back to sitting on the side of a flat bed without using bedrails?: A Lot Help needed moving to and from a bed to a chair (including a wheelchair)?: A Lot Help needed standing up from a chair using your arms (e.g., wheelchair or bedside chair)?: A Lot Help needed to walk in hospital room?: Total Help needed climbing 3-5 steps with a railing? : Total 6 Click Score: 10    End of  Session Equipment Utilized During Treatment: Gait belt Activity Tolerance: Patient limited by pain;Patient limited by fatigue Patient left: in bed;with call bell/phone within reach;with bed alarm set;with SCD's reapplied Nurse Communication: Mobility status PT Visit Diagnosis: Other abnormalities of gait and mobility (R26.89);Muscle weakness (generalized) (M62.81)    Time: 1227-1300 PT Time Calculation (min) (ACUTE ONLY): 33 min   Charges:   PT Evaluation $PT Eval Low Complexity: 1 Low PT Treatments $Therapeutic Activity: 8-22 mins       Mella Inclan E, PT Acute Rehabilitation Services Pager [phone]  Office [phone]   [place] Rosevear D Sem Mccaughey 12/12/2019, 3:01 PM

## 2019-12-13 LAB — CBC
HCT: 24 % — ABNORMAL LOW (ref 36.0–46.0)
Hemoglobin: 7.9 g/dL — ABNORMAL LOW (ref 12.0–15.0)
MCH: 28.8 pg (ref 26.0–34.0)
MCHC: 32.9 g/dL (ref 30.0–36.0)
MCV: 87.6 fL (ref 80.0–100.0)
Platelets: 219 10*3/uL (ref 150–400)
RBC: 2.74 MIL/uL — ABNORMAL LOW (ref 3.87–5.11)
RDW: 15.7 % — ABNORMAL HIGH (ref 11.5–15.5)
WBC: 12.5 10*3/uL — ABNORMAL HIGH (ref 4.0–10.5)
nRBC: 0.2 % (ref 0.0–0.2)

## 2019-12-13 LAB — BASIC METABOLIC PANEL
Anion gap: 10 (ref 5–15)
BUN: 13 mg/dL (ref 8–23)
CO2: 21 mmol/L — ABNORMAL LOW (ref 22–32)
Calcium: 8.6 mg/dL — ABNORMAL LOW (ref 8.9–10.3)
Chloride: 106 mmol/L (ref 98–111)
Creatinine, Ser: 1.15 mg/dL — ABNORMAL HIGH (ref 0.44–1.00)
GFR calc Af Amer: 49 mL/min — ABNORMAL LOW (ref >60)
GFR calc non Af Amer: 42 mL/min — ABNORMAL LOW (ref >60)
Glucose, Bld: 108 mg/dL — ABNORMAL HIGH (ref 70–99)
Potassium: 3.2 mmol/L — ABNORMAL LOW (ref 3.5–5.1)
Sodium: 137 mmol/L (ref 135–145)

## 2019-12-13 LAB — TSH: TSH: 3.202 u[IU]/mL (ref 0.350–4.500)

## 2019-12-13 LAB — MAGNESIUM: Magnesium: 1.4 mg/dL — ABNORMAL LOW (ref 1.7–2.4)

## 2019-12-13 MED ORDER — DICLOFENAC SODIUM 1 % EX GEL
2.0000 g | Freq: Three times a day (TID) | CUTANEOUS | Status: DC | PRN
  Administered 2019-12-13 – 2019-12-16 (×2): 2 g via TOPICAL
  Filled 2019-12-13: qty 100

## 2019-12-13 MED ORDER — LEVALBUTEROL HCL 1.25 MG/0.5ML IN NEBU
1.2500 mg | INHALATION_SOLUTION | Freq: Two times a day (BID) | RESPIRATORY_TRACT | Status: DC
  Administered 2019-12-13 – 2019-12-22 (×16): 1.25 mg via RESPIRATORY_TRACT
  Filled 2019-12-13 (×19): qty 0.5

## 2019-12-13 MED ORDER — LEVOTHYROXINE SODIUM 75 MCG PO TABS
75.0000 ug | ORAL_TABLET | Freq: Every day | ORAL | Status: DC
  Administered 2019-12-14 – 2019-12-22 (×9): 75 ug via ORAL
  Filled 2019-12-13 (×9): qty 1

## 2019-12-13 MED ORDER — MELATONIN 3 MG PO TABS
3.0000 mg | ORAL_TABLET | Freq: Once | ORAL | Status: AC
  Administered 2019-12-13: 3 mg via ORAL
  Filled 2019-12-13: qty 1

## 2019-12-13 MED ORDER — HYDRALAZINE HCL 10 MG PO TABS
10.0000 mg | ORAL_TABLET | Freq: Three times a day (TID) | ORAL | Status: DC
  Administered 2019-12-13 – 2019-12-14 (×4): 10 mg via ORAL
  Filled 2019-12-13 (×4): qty 1

## 2019-12-13 MED ORDER — MAGNESIUM SULFATE 2 GM/50ML IV SOLN
2.0000 g | Freq: Once | INTRAVENOUS | Status: AC
  Administered 2019-12-13: 2 g via INTRAVENOUS
  Filled 2019-12-13: qty 50

## 2019-12-13 MED ORDER — POTASSIUM CHLORIDE IN NACL 40-0.9 MEQ/L-% IV SOLN
INTRAVENOUS | Status: DC
  Administered 2019-12-13 – 2019-12-14 (×2): 50 mL/h via INTRAVENOUS
  Filled 2019-12-13 (×2): qty 1000

## 2019-12-13 NOTE — Significant Event (Signed)
Rapid Response Event Note  Overview: Time Called: 1032 Arrival Time: 1033 Event Type: Cardiac, Neurologic  Initial Focused Assessment: Patient with syncopal episode while on BSC.  Upon review of rhythm strips: she had a brief episode of JR then ST. After event and while she was lying calmly in the bed her HR varied from SR to ST with PVCs and 3-5 beat runs of VT. Then her HR varied between SR and SB.  BP 154/69 - 130/61   SR 80s SB 40s Occasional ST 110s See saved strips  K+ 3.2 Mag 1.4  Interventions: 12 lead EKG done 1115:  Dr Doylene Canard at bedside Orders received for am labs Potassium and magnesium replacement  Plan of Care (if not transferred): RN to call if assistance needed  Event Summary: Name of Physician Notified: Kadakia at 1038    at    Outcome: Stayed in room and stabalized  Event End Time: [place]  Raliegh Ip

## 2019-12-13 NOTE — Progress Notes (Signed)
   12/13/19 1046  Clinical Encounter Type  Visited With Health care provider  Visit Type Initial   Chaplain responded to a code blue. Code blue was cancelled, and according to RN, there are no needs at this time. Spiritual care services available as needed.   Jeri Lager, Chaplain

## 2019-12-13 NOTE — Plan of Care (Signed)
  Problem: Education: Goal: Knowledge of General Education information will improve Description: Including pain rating scale, medication(s)/side effects and non-pharmacologic comfort measures Outcome: Progressing   Problem: Clinical Measurements: Goal: Ability to maintain clinical measurements within normal limits will improve Outcome: Progressing Goal: Will remain free from infection Outcome: Progressing Goal: Diagnostic test results will improve Outcome: Progressing Goal: Respiratory complications will improve Outcome: Progressing Goal: Cardiovascular complication will be avoided Outcome: Progressing   Problem: Activity: Goal: Risk for activity intolerance will decrease Outcome: Progressing   Problem: Elimination: Goal: Will not experience complications related to bowel motility Outcome: Progressing Goal: Will not experience complications related to urinary retention Outcome: Progressing   Problem: Pain Managment: Goal: General experience of comfort will improve Outcome: Progressing   Problem: Safety: Goal: Ability to remain free from injury will improve Outcome: Progressing   Problem: Skin Integrity: Goal: Risk for impaired skin integrity will decrease Outcome: Progressing   Problem: Education: Goal: Ability to identify signs and symptoms of gastrointestinal bleeding will improve Outcome: Progressing   Problem: Bowel/Gastric: Goal: Will show no signs and symptoms of gastrointestinal bleeding Outcome: Progressing   Problem: Fluid Volume: Goal: Will show no signs and symptoms of excessive bleeding Outcome: Progressing   Problem: Clinical Measurements: Goal: Complications related to the disease process, condition or treatment will be avoided or minimized Outcome: Progressing

## 2019-12-13 NOTE — Progress Notes (Signed)
Ref: [name], MD   Subjective:  Awake. Taking little. Patient had vagal inhibition for heart rate in 20-30's and felt dizzy at that time. She feels fine now. Her magnesium and potassium levels are low needing supplement.  She does not have CHF and can use some fluids for her pneumonia.  Monitor shows episodes of sinus bradycardia and non-sustained VT.  EKG showed inferior wall MI and possible diffuse ischemia.  Objective:  Vital Signs in the last 24 hours: Temp:  [98 F (36.7 C)-98.4 F (36.9 C)] 98.4 F (36.9 C) (01/09 0850) Pulse Rate:  [74-95] 84 (01/09 1043) Cardiac Rhythm: Normal sinus rhythm (01/09 0850) Resp:  [15-22] 15 (01/09 1043) BP: (130-161)/(58-69) 130/61 (01/09 1043) SpO2:  [96 %-100 %] 98 % (01/09 0935)  Physical Exam: BP Readings from Last 1 Encounters:  12/13/19 130/61     Wt Readings from Last 1 Encounters:  12/11/19 79.4 kg    Weight change:  Body mass index is 31.5 kg/m. HEENT: [place]/AT, Eyes-Brown, Conjunctiva-Pale, Sclera-Non-icteric Neck: No JVD, No bruit, Trachea midline. Lungs:  Clearing, Bilateral. Cardiac:  Regular rhythm, normal S1 and S2, no S3. II/VI systolic murmur. Abdomen:  Soft, non-tender. BS present. Extremities:  Trace edema present. No cyanosis. No clubbing. CNS: AxOx2, Cranial nerves grossly intact, moves all 4 extremities.  Skin: Warm and dry.   Intake/Output from previous day: 01/08 0701 - 01/09 0700 In: 230 [P.O.:230] Out: 1000 [Urine:1000]    Lab Results: BMET    Component Value Date/Time   NA 137 12/13/2019 0110   NA 139 12/12/2019 0423   NA 141 12/11/2019 1325   K 3.2 (L) 12/13/2019 0110   K 3.5 12/12/2019 0423   K 4.0 12/11/2019 1325   CL 106 12/13/2019 0110   CL 108 12/12/2019 0423   CL 114 (H) 12/11/2019 1325   CO2 21 (L) 12/13/2019 0110   CO2 18 (L) 12/12/2019 0423   CO2 18 (L) 12/11/2019 1325   GLUCOSE 108 (H) 12/13/2019 0110   GLUCOSE 90 12/12/2019 0423   GLUCOSE 137 (H) 12/11/2019 1325   BUN 13  12/13/2019 0110   BUN 20 12/12/2019 0423   BUN 24 (H) 12/11/2019 1325   CREATININE 1.15 (H) 12/13/2019 0110   CREATININE 1.23 (H) 12/12/2019 0423   CREATININE 1.39 (H) 12/11/2019 1325   CALCIUM 8.6 (L) 12/13/2019 0110   CALCIUM 8.8 (L) 12/12/2019 0423   CALCIUM 8.9 12/11/2019 1325   GFRNONAA 42 (L) 12/13/2019 0110   GFRNONAA 39 (L) 12/12/2019 0423   GFRNONAA 33 (L) 12/11/2019 1325   GFRAA 49 (L) 12/13/2019 0110   GFRAA 45 (L) 12/12/2019 0423   GFRAA 39 (L) 12/11/2019 1325   CBC    Component Value Date/Time   WBC 12.5 (H) 12/13/2019 0110   RBC 2.74 (L) 12/13/2019 0110   HGB 7.9 (L) 12/13/2019 0110   HCT 24.0 (L) 12/13/2019 0110   PLT 219 12/13/2019 0110   MCV 87.6 12/13/2019 0110   MCH 28.8 12/13/2019 0110   MCHC 32.9 12/13/2019 0110   RDW 15.7 (H) 12/13/2019 0110   LYMPHSABS 1.4 12/11/2019 1526   MONOABS 0.6 12/11/2019 1526   EOSABS 0.1 12/11/2019 1526   BASOSABS 0.0 12/11/2019 1526   HEPATIC Function Panel Recent Labs    12/11/19 1325  PROT 5.5*   HEMOGLOBIN A1C No components found for: HGA1C,  MPG CARDIAC ENZYMES No results found for: CKTOTAL, CKMB, CKMBINDEX, TROPONINI BNP No results for input(s): PROBNP in the last 8760 hours. TSH No results  for input(s): TSH in the last 8760 hours. CHOLESTEROL No results for input(s): CHOL in the last 8760 hours.  Scheduled Meds: . Chlorhexidine Gluconate Cloth  6 each Topical Q0600  . ferrous sulfate  325 mg Oral TID WC  . hydrALAZINE  10 mg Oral Q8H  . levalbuterol  1.25 mg Nebulization TID  . [START ON 12/14/2019] levothyroxine  75 mcg Oral Q0600  . mupirocin ointment  1 application Nasal BID  . pantoprazole (PROTONIX) IV  40 mg Intravenous Q12H   Continuous Infusions: . 0.9 % NaCl with KCl 40 mEq / L    . azithromycin 500 mg (12/12/19 1604)  . [START ON 12/19/2019] cefTRIAXone (ROCEPHIN)  IV    . magnesium sulfate bolus IVPB     PRN Meds:.acetaminophen, polyethylene glycol  Assessment/Plan: Acute on chronic  upper GI bleed Left lower lobe pneumonia Hypothyroidism S/P syncope Sinus bradycardia Non-sustained VT  Add potassium and magnesium. Check TSH. Add small dose hydralazine if tolerated. Labs in AM.   LOS: 2 days   Time spent including chart review, lab review, examination, discussion with patient, nurse and daughter : 37  min   Dixie Dials  MD  12/13/2019, 11:27 AM

## 2019-12-13 NOTE — Progress Notes (Signed)
Patient was sitting on the [facility] because she cant' use bedpan for BM. Patient's HR was up to 120-130's then went down slowly. Pt's daughter called for help, she wasn't responsive on the [facility]. She didn't move at all. Move to bed, and HR went down to 20 s-30's then back up to 60's -70's. 2-3 min after she was talking. Rapid response nurse and cold blue team arrived to the room. Paging Dr. Doylene Canard regarding this matter and Dr. Doylene Canard will come to check on patient. Explained pt's daughter what happen to her. [employer]

## 2019-12-14 ENCOUNTER — Encounter (HOSPITAL_COMMUNITY): Payer: Self-pay | Admitting: Cardiology

## 2019-12-14 LAB — RENAL FUNCTION PANEL
Albumin: 2.5 g/dL — ABNORMAL LOW (ref 3.5–5.0)
Anion gap: 11 (ref 5–15)
BUN: 21 mg/dL (ref 8–23)
CO2: 19 mmol/L — ABNORMAL LOW (ref 22–32)
Calcium: 8.4 mg/dL — ABNORMAL LOW (ref 8.9–10.3)
Chloride: 110 mmol/L (ref 98–111)
Creatinine, Ser: 1.08 mg/dL — ABNORMAL HIGH (ref 0.44–1.00)
GFR calc Af Amer: 52 mL/min — ABNORMAL LOW (ref >60)
GFR calc non Af Amer: 45 mL/min — ABNORMAL LOW (ref >60)
Glucose, Bld: 104 mg/dL — ABNORMAL HIGH (ref 70–99)
Phosphorus: 2.6 mg/dL (ref 2.5–4.6)
Potassium: 3.9 mmol/L (ref 3.5–5.1)
Sodium: 140 mmol/L (ref 135–145)

## 2019-12-14 LAB — CBC WITH DIFFERENTIAL/PLATELET
Abs Immature Granulocytes: 0.12 10*3/uL — ABNORMAL HIGH (ref 0.00–0.07)
Basophils Absolute: 0 10*3/uL (ref 0.0–0.1)
Basophils Relative: 0 %
Eosinophils Absolute: 0.1 10*3/uL (ref 0.0–0.5)
Eosinophils Relative: 1 %
HCT: 21.2 % — ABNORMAL LOW (ref 36.0–46.0)
Hemoglobin: 6.9 g/dL — CL (ref 12.0–15.0)
Immature Granulocytes: 1 %
Lymphocytes Relative: 12 %
Lymphs Abs: 1.7 10*3/uL (ref 0.7–4.0)
MCH: 28.8 pg (ref 26.0–34.0)
MCHC: 32.5 g/dL (ref 30.0–36.0)
MCV: 88.3 fL (ref 80.0–100.0)
Monocytes Absolute: 1.4 10*3/uL — ABNORMAL HIGH (ref 0.1–1.0)
Monocytes Relative: 11 %
Neutro Abs: 10.3 10*3/uL — ABNORMAL HIGH (ref 1.7–7.7)
Neutrophils Relative %: 75 %
Platelets: 239 10*3/uL (ref 150–400)
RBC: 2.4 MIL/uL — ABNORMAL LOW (ref 3.87–5.11)
RDW: 15.8 % — ABNORMAL HIGH (ref 11.5–15.5)
WBC: 13.7 10*3/uL — ABNORMAL HIGH (ref 4.0–10.5)
nRBC: 0.1 % (ref 0.0–0.2)

## 2019-12-14 LAB — CBC
HCT: 24.8 % — ABNORMAL LOW (ref 36.0–46.0)
Hemoglobin: 8 g/dL — ABNORMAL LOW (ref 12.0–15.0)
MCH: 28 pg (ref 26.0–34.0)
MCHC: 32.3 g/dL (ref 30.0–36.0)
MCV: 86.7 fL (ref 80.0–100.0)
Platelets: 218 10*3/uL (ref 150–400)
RBC: 2.86 MIL/uL — ABNORMAL LOW (ref 3.87–5.11)
RDW: 15.5 % (ref 11.5–15.5)
WBC: 11.3 10*3/uL — ABNORMAL HIGH (ref 4.0–10.5)
nRBC: 0.2 % (ref 0.0–0.2)

## 2019-12-14 LAB — CULTURE, RESPIRATORY W GRAM STAIN

## 2019-12-14 LAB — PREPARE RBC (CROSSMATCH)

## 2019-12-14 MED ORDER — ATORVASTATIN CALCIUM 10 MG PO TABS
20.0000 mg | ORAL_TABLET | Freq: Every day | ORAL | Status: DC
  Administered 2019-12-14 – 2019-12-21 (×8): 20 mg via ORAL
  Filled 2019-12-14 (×8): qty 2

## 2019-12-14 MED ORDER — SODIUM CHLORIDE 0.9% IV SOLUTION: Freq: Once | INTRAVENOUS | Status: DC

## 2019-12-14 MED ORDER — HYDRALAZINE HCL 10 MG PO TABS
10.0000 mg | ORAL_TABLET | Freq: Two times a day (BID) | ORAL | Status: DC
  Administered 2019-12-15 – 2019-12-17 (×5): 10 mg via ORAL
  Filled 2019-12-14 (×5): qty 1

## 2019-12-14 MED ORDER — AMLODIPINE BESYLATE 5 MG PO TABS
5.0000 mg | ORAL_TABLET | Freq: Every day | ORAL | Status: DC
  Administered 2019-12-14 – 2019-12-22 (×9): 5 mg via ORAL
  Filled 2019-12-14 (×9): qty 1

## 2019-12-14 NOTE — Plan of Care (Signed)
  Problem: Education: Goal: Knowledge of General Education information will improve Description: Including pain rating scale, medication(s)/side effects and non-pharmacologic comfort measures Outcome: Progressing   Problem: Clinical Measurements: Goal: Ability to maintain clinical measurements within normal limits will improve Outcome: Progressing Goal: Will remain free from infection Outcome: Progressing Goal: Diagnostic test results will improve Outcome: Progressing Goal: Respiratory complications will improve Outcome: Progressing Goal: Cardiovascular complication will be avoided Outcome: Progressing   Problem: Activity: Goal: Risk for activity intolerance will decrease Outcome: Progressing   Problem: Elimination: Goal: Will not experience complications related to bowel motility Outcome: Progressing Goal: Will not experience complications related to urinary retention Outcome: Progressing   Problem: Pain Managment: Goal: General experience of comfort will improve Outcome: Progressing   Problem: Safety: Goal: Ability to remain free from injury will improve Outcome: Progressing   Problem: Skin Integrity: Goal: Risk for impaired skin integrity will decrease Outcome: Progressing   Problem: Education: Goal: Ability to identify signs and symptoms of gastrointestinal bleeding will improve Outcome: Progressing   Problem: Bowel/Gastric: Goal: Will show no signs and symptoms of gastrointestinal bleeding Outcome: Progressing   Problem: Fluid Volume: Goal: Will show no signs and symptoms of excessive bleeding Outcome: Progressing   Problem: Clinical Measurements: Goal: Complications related to the disease process, condition or treatment will be avoided or minimized Outcome: Progressing

## 2019-12-14 NOTE — Progress Notes (Signed)
Ref: [name], MD   Subjective:  Awake and alert. Heart rate in 90's. She needed 1 unit of PRBC for low Hgb of 6.9 this AM. Post transfusion Hgb is 8.0   Objective:  Vital Signs in the last 24 hours: Temp:  [98.2 F (36.8 C)-99.6 F (37.6 C)] 98.2 F (36.8 C) (01/10 1126) Pulse Rate:  [66-96] 92 (01/10 1126) Cardiac Rhythm: Normal sinus rhythm (01/10 0701) Resp:  [17-27] 27 (01/10 1126) BP: (100-165)/(39-132) 164/68 (01/10 1126) SpO2:  [10 %-100 %] 96 % (01/10 1126)  Physical Exam: BP Readings from Last 1 Encounters:  12/14/19 (!) 164/68     Wt Readings from Last 1 Encounters:  12/11/19 79.4 kg    Weight change:  Body mass index is 31.5 kg/m. HEENT: [place]/AT, Eyes-Brown, Conjunctiva-Pale, Sclera-Non-icteric Neck: No JVD, No bruit, Trachea midline. Lungs:  Clear, Bilateral. Cardiac:  Regular rhythm, normal S1 and S2, no S3. II/VI systolic murmur. Abdomen:  Soft, non-tender. BS present. Extremities:  Trace edema present. No cyanosis. No clubbing. CNS: AxOx3, Cranial nerves grossly intact, moves all 4 extremities.  Skin: Warm and dry.   Intake/Output from previous day: 01/09 0701 - 01/10 0700 In: 924 [P.O.:250; I.V.:80; Blood:344; IV Piggyback:250] Out: 550 [Urine:550]    Lab Results: BMET    Component Value Date/Time   NA 140 12/14/2019 0100   NA 137 12/13/2019 0110   NA 139 12/12/2019 0423   K 3.9 12/14/2019 0100   K 3.2 (L) 12/13/2019 0110   K 3.5 12/12/2019 0423   CL 110 12/14/2019 0100   CL 106 12/13/2019 0110   CL 108 12/12/2019 0423   CO2 19 (L) 12/14/2019 0100   CO2 21 (L) 12/13/2019 0110   CO2 18 (L) 12/12/2019 0423   GLUCOSE 104 (H) 12/14/2019 0100   GLUCOSE 108 (H) 12/13/2019 0110   GLUCOSE 90 12/12/2019 0423   BUN 21 12/14/2019 0100   BUN 13 12/13/2019 0110   BUN 20 12/12/2019 0423   CREATININE 1.08 (H) 12/14/2019 0100   CREATININE 1.15 (H) 12/13/2019 0110   CREATININE 1.23 (H) 12/12/2019 0423   CALCIUM 8.4 (L) 12/14/2019 0100   CALCIUM  8.6 (L) 12/13/2019 0110   CALCIUM 8.8 (L) 12/12/2019 0423   GFRNONAA 45 (L) 12/14/2019 0100   GFRNONAA 42 (L) 12/13/2019 0110   GFRNONAA 39 (L) 12/12/2019 0423   GFRAA 52 (L) 12/14/2019 0100   GFRAA 49 (L) 12/13/2019 0110   GFRAA 45 (L) 12/12/2019 0423   CBC    Component Value Date/Time   WBC 11.3 (H) 12/14/2019 0918   RBC 2.86 (L) 12/14/2019 0918   HGB 8.0 (L) 12/14/2019 0918   HCT 24.8 (L) 12/14/2019 0918   PLT 218 12/14/2019 0918   MCV 86.7 12/14/2019 0918   MCH 28.0 12/14/2019 0918   MCHC 32.3 12/14/2019 0918   RDW 15.5 12/14/2019 0918   LYMPHSABS 1.7 12/14/2019 0100   MONOABS 1.4 (H) 12/14/2019 0100   EOSABS 0.1 12/14/2019 0100   BASOSABS 0.0 12/14/2019 0100   HEPATIC Function Panel Recent Labs    12/11/19 1325  PROT 5.5*   HEMOGLOBIN A1C No components found for: HGA1C,  MPG CARDIAC ENZYMES No results found for: CKTOTAL, CKMB, CKMBINDEX, TROPONINI BNP No results for input(s): PROBNP in the last 8760 hours. TSH Recent Labs    12/13/19 1234  TSH 3.202   CHOLESTEROL No results for input(s): CHOL in the last 8760 hours.  Scheduled Meds: . sodium chloride   Intravenous Once  . amLODipine  5 mg Oral Daily  . atorvastatin  20 mg Oral q1800  . Chlorhexidine Gluconate Cloth  6 each Topical Q0600  . ferrous sulfate  325 mg Oral TID WC  . hydrALAZINE  10 mg Oral Q8H  . levalbuterol  1.25 mg Nebulization BID  . levothyroxine  75 mcg Oral Q0600  . mupirocin ointment  1 application Nasal BID  . pantoprazole (PROTONIX) IV  40 mg Intravenous Q12H   Continuous Infusions: . azithromycin 500 mg (12/13/19 1423)  . [START ON 12/19/2019] cefTRIAXone (ROCEPHIN)  IV     PRN Meds:.acetaminophen, diclofenac Sodium, polyethylene glycol  Assessment/Plan: Acute on chronic GI bleed Anemia of blood loss Left lower lobe pneumonia Hypertension Hypothyroidism S/P syncope Non-sustained VT Hypokalemia, resolved Hypomagnesemia  Resume amlodipine for HTN. Hold B-blocker for  now.. Increase activity. In light of need for blood transfusion, discussed EGD and colonoscopy. Patient and daughter to think about it   LOS: 3 days   Time spent including chart review, lab review, examination, discussion with patient : 35 min   Dixie Dials  MD  12/14/2019, 3:14 PM

## 2019-12-15 LAB — CBC WITH DIFFERENTIAL/PLATELET
Abs Immature Granulocytes: 0.07 10*3/uL (ref 0.00–0.07)
Basophils Absolute: 0 10*3/uL (ref 0.0–0.1)
Basophils Relative: 0 %
Eosinophils Absolute: 0.3 10*3/uL (ref 0.0–0.5)
Eosinophils Relative: 2 %
HCT: 24.6 % — ABNORMAL LOW (ref 36.0–46.0)
Hemoglobin: 8 g/dL — ABNORMAL LOW (ref 12.0–15.0)
Immature Granulocytes: 1 %
Lymphocytes Relative: 13 %
Lymphs Abs: 1.7 10*3/uL (ref 0.7–4.0)
MCH: 28.3 pg (ref 26.0–34.0)
MCHC: 32.5 g/dL (ref 30.0–36.0)
MCV: 86.9 fL (ref 80.0–100.0)
Monocytes Absolute: 1.2 10*3/uL — ABNORMAL HIGH (ref 0.1–1.0)
Monocytes Relative: 9 %
Neutro Abs: 9.5 10*3/uL — ABNORMAL HIGH (ref 1.7–7.7)
Neutrophils Relative %: 75 %
Platelets: 222 10*3/uL (ref 150–400)
RBC: 2.83 MIL/uL — ABNORMAL LOW (ref 3.87–5.11)
RDW: 16.1 % — ABNORMAL HIGH (ref 11.5–15.5)
WBC: 12.7 10*3/uL — ABNORMAL HIGH (ref 4.0–10.5)
nRBC: 0.2 % (ref 0.0–0.2)

## 2019-12-15 LAB — BPAM RBC
Blood Product Expiration Date: 202102092359
Blood Product Expiration Date: 202102092359
Blood Product Expiration Date: 202102092359
Blood Product Expiration Date: 202102092359
ISSUE DATE / TIME: 202101071715
ISSUE DATE / TIME: 202101072044
ISSUE DATE / TIME: 202101100353
Unit Type and Rh: 5100
Unit Type and Rh: 5100
Unit Type and Rh: 5100
Unit Type and Rh: 5100

## 2019-12-15 LAB — BASIC METABOLIC PANEL
Anion gap: 8 (ref 5–15)
BUN: 13 mg/dL (ref 8–23)
CO2: 21 mmol/L — ABNORMAL LOW (ref 22–32)
Calcium: 8.6 mg/dL — ABNORMAL LOW (ref 8.9–10.3)
Chloride: 109 mmol/L (ref 98–111)
Creatinine, Ser: 1.01 mg/dL — ABNORMAL HIGH (ref 0.44–1.00)
GFR calc Af Amer: 57 mL/min — ABNORMAL LOW (ref >60)
GFR calc non Af Amer: 49 mL/min — ABNORMAL LOW (ref >60)
Glucose, Bld: 108 mg/dL — ABNORMAL HIGH (ref 70–99)
Potassium: 3.5 mmol/L (ref 3.5–5.1)
Sodium: 138 mmol/L (ref 135–145)

## 2019-12-15 LAB — TYPE AND SCREEN
ABO/RH(D): O POS
Antibody Screen: NEGATIVE
Unit division: 0
Unit division: 0
Unit division: 0
Unit division: 0

## 2019-12-15 LAB — MAGNESIUM: Magnesium: 1.6 mg/dL — ABNORMAL LOW (ref 1.7–2.4)

## 2019-12-15 MED ORDER — FLEET ENEMA 7-19 GM/118ML RE ENEM
1.0000 | ENEMA | Freq: Once | RECTAL | Status: AC
  Administered 2019-12-16: 1 via RECTAL
  Filled 2019-12-15: qty 1

## 2019-12-15 MED ORDER — POTASSIUM CHLORIDE CRYS ER 20 MEQ PO TBCR
40.0000 meq | EXTENDED_RELEASE_TABLET | Freq: Once | ORAL | Status: AC
  Administered 2019-12-15: 40 meq via ORAL
  Filled 2019-12-15: qty 2

## 2019-12-15 MED ORDER — MAGNESIUM SULFATE 2 GM/50ML IV SOLN
2.0000 g | Freq: Once | INTRAVENOUS | Status: AC
  Administered 2019-12-15: 2 g via INTRAVENOUS
  Filled 2019-12-15: qty 50

## 2019-12-15 MED ORDER — SODIUM CHLORIDE 0.9 % IV SOLN
2.0000 g | INTRAVENOUS | Status: DC
  Administered 2019-12-15 – 2019-12-18 (×4): 2 g via INTRAVENOUS
  Filled 2019-12-15 (×2): qty 2
  Filled 2019-12-15: qty 20
  Filled 2019-12-15: qty 2

## 2019-12-15 NOTE — Progress Notes (Signed)
Subjective:  Denies any chest pain or shortness of breath.  Complains of chronic constipation.  Has not had BM in the last few days.. No further episodes of marked bradycardia.  Occasional few beats of nonsustained VT on the monitor  Objective:  Vital Signs in the last 24 hours: Temp:  [98.4 F (36.9 C)-99.2 F (37.3 C)] 99.1 F (37.3 C) (01/11 1116) Pulse Rate:  [83-98] 91 (01/11 1116) Resp:  [17-28] 20 (01/11 1116) BP: (140-180)/(57-90) 163/69 (01/11 1116) SpO2:  [97 %-100 %] 97 % (01/11 1116)  Intake/Output from previous day: 01/10 0701 - 01/11 0700 In: 1655.3 [P.O.:340; I.V.:1142.2; IV Piggyback:173.1] Out: 1050 [Urine:1050] Intake/Output from this shift: No intake/output data recorded.  Physical Exam: Neck: no adenopathy, no carotid bruit, no JVD and supple, symmetrical, trachea midline Lungs: decreased breath sounds at bases Heart: regular rate and rhythm, S1, S2 normal and soft systolic murmur noted Abdomen: soft, non-tender; bowel sounds normal; no masses,  no organomegaly Extremities: extremities normal, atraumatic, no cyanosis or edema  Lab Results: Recent Labs    12/14/19 0918 12/15/19 0136  WBC 11.3* 12.7*  HGB 8.0* 8.0*  PLT 218 222   Recent Labs    12/14/19 0100 12/15/19 0136  NA 140 138  K 3.9 3.5  CL 110 109  CO2 19* 21*  GLUCOSE 104* 108*  BUN 21 13  CREATININE 1.08* 1.01*   No results for input(s): TROPONINI in the last 72 hours.  Invalid input(s): CK, MB Hepatic Function Panel Recent Labs    12/14/19 0100  ALBUMIN 2.5*   No results for input(s): CHOL in the last 72 hours. No results for input(s): PROTIME in the last 72 hours.  Imaging: Imaging results have been reviewed and No results found.  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 Hypomagnesemia Chronic constipation. Plan Rx for constipation. Replace K and mag Check labs in a.m.   LOS: 4 days    [name] 12/15/2019, 2:02 PM

## 2019-12-16 ENCOUNTER — Inpatient Hospital Stay (HOSPITAL_COMMUNITY): Payer: Medicare Other

## 2019-12-16 LAB — CBC
HCT: 26.8 % — ABNORMAL LOW (ref 36.0–46.0)
Hemoglobin: 8.6 g/dL — ABNORMAL LOW (ref 12.0–15.0)
MCH: 28.5 pg (ref 26.0–34.0)
MCHC: 32.1 g/dL (ref 30.0–36.0)
MCV: 88.7 fL (ref 80.0–100.0)
Platelets: 254 10*3/uL (ref 150–400)
RBC: 3.02 MIL/uL — ABNORMAL LOW (ref 3.87–5.11)
RDW: 16.8 % — ABNORMAL HIGH (ref 11.5–15.5)
WBC: 14.9 10*3/uL — ABNORMAL HIGH (ref 4.0–10.5)
nRBC: 0 % (ref 0.0–0.2)

## 2019-12-16 LAB — BASIC METABOLIC PANEL
Anion gap: 10 (ref 5–15)
BUN: 10 mg/dL (ref 8–23)
CO2: 22 mmol/L (ref 22–32)
Calcium: 9 mg/dL (ref 8.9–10.3)
Chloride: 105 mmol/L (ref 98–111)
Creatinine, Ser: 0.98 mg/dL (ref 0.44–1.00)
GFR calc Af Amer: 59 mL/min — ABNORMAL LOW (ref >60)
GFR calc non Af Amer: 51 mL/min — ABNORMAL LOW (ref >60)
Glucose, Bld: 105 mg/dL — ABNORMAL HIGH (ref 70–99)
Potassium: 4.2 mmol/L (ref 3.5–5.1)
Sodium: 137 mmol/L (ref 135–145)

## 2019-12-16 LAB — CULTURE, BLOOD (ROUTINE X 2)
Culture: NO GROWTH
Culture: NO GROWTH
Special Requests: ADEQUATE

## 2019-12-16 LAB — MAGNESIUM: Magnesium: 1.8 mg/dL (ref 1.7–2.4)

## 2019-12-16 MED ORDER — FUROSEMIDE 40 MG PO TABS
40.0000 mg | ORAL_TABLET | Freq: Every day | ORAL | Status: DC
  Administered 2019-12-17 – 2019-12-20 (×4): 40 mg via ORAL
  Filled 2019-12-16 (×4): qty 1

## 2019-12-16 MED ORDER — LEVALBUTEROL HCL 0.63 MG/3ML IN NEBU
0.6300 mg | INHALATION_SOLUTION | Freq: Four times a day (QID) | RESPIRATORY_TRACT | Status: DC | PRN
  Administered 2019-12-16 – 2019-12-22 (×7): 0.63 mg via RESPIRATORY_TRACT
  Filled 2019-12-16 (×7): qty 3

## 2019-12-16 MED ORDER — POTASSIUM CHLORIDE CRYS ER 10 MEQ PO TBCR
20.0000 meq | EXTENDED_RELEASE_TABLET | Freq: Every day | ORAL | Status: DC
  Administered 2019-12-16 – 2019-12-20 (×5): 20 meq via ORAL
  Filled 2019-12-16 (×10): qty 2

## 2019-12-16 MED ORDER — GUAIFENESIN-DM 100-10 MG/5ML PO SYRP
5.0000 mL | ORAL_SOLUTION | ORAL | Status: DC | PRN
  Administered 2019-12-16 (×2): 5 mL via ORAL
  Filled 2019-12-16 (×5): qty 5

## 2019-12-16 MED ORDER — FUROSEMIDE 10 MG/ML IJ SOLN
40.0000 mg | Freq: Once | INTRAMUSCULAR | Status: AC
  Administered 2019-12-16: 40 mg via INTRAVENOUS
  Filled 2019-12-16: qty 4

## 2019-12-16 NOTE — NC FL2 (Signed)
[place] LEVEL OF CARE SCREENING TOOL     IDENTIFICATION  Patient Name: Monique Lin Birthdate: 1927-04-18 Sex: female Admission Date (Current Location): 12/11/2019  [facility] and [place] Number:  Herbalist and Address:  [facility] [place], [place] [address] [place], [place] [address]      Provider Number: [id number]  Attending Physician Name and Address:  [name], MD  Relative Name and Phone Number:  [name] (daughter) [phone]    Current Level of Care: Hospital Recommended Level of Care: [employer] Prior Approval Number:    Date Approved/Denied:   PASRR Number: [id number]  Discharge Plan: SNF    Current Diagnoses: Patient Active Problem List   Diagnosis Date Noted  . Acute GI bleeding 12/11/2019  . CAP (community acquired pneumonia)   . Empyema ([place])   . Pleural effusion, left   . Left lower lobe pneumonia 04/01/2018  . Bronchitis     Orientation RESPIRATION BLADDER Height & Weight     Self, Time, Situation, Place  Normal Continent, External catheter Weight: 175 lb (79.4 kg) Height:  5' 2.5" (158.8 cm)  BEHAVIORAL SYMPTOMS/MOOD NEUROLOGICAL BOWEL NUTRITION STATUS      Continent Diet(see discharge summary)  AMBULATORY STATUS COMMUNICATION OF NEEDS Skin   Limited Assist Verbally Normal                       Personal Care Assistance Level of Assistance  Bathing, Feeding, Dressing, Total care Bathing Assistance: Limited assistance Feeding assistance: Independent Dressing Assistance: Limited assistance Total Care Assistance: Limited assistance   Functional Limitations Info  Sight, Hearing, Speech Sight Info: Adequate Hearing Info: Adequate Speech Info: Adequate    SPECIAL CARE FACTORS FREQUENCY  PT (By licensed PT), OT (By licensed OT)     PT Frequency: min 5x weekly OT Frequency: min 5x weekly            Contractures Contractures Info: Not present    Additional Factors Info   Code Status, Allergies Code Status Info: full Allergies Info: Codeine, Penicillins           Current Medications (12/16/2019):  This is the current hospital active medication list Current Facility-Administered Medications  Medication Dose Route Frequency Provider Last Rate Last Admin  . 0.9 %  sodium chloride infusion (Manually program via Guardrails IV Fluids)   Intravenous Once Dixie Dials, MD      . acetaminophen (TYLENOL) tablet 500 mg  500 mg Oral Q6H PRN [name], MD   500 mg at 12/16/19 1225  . amLODipine (NORVASC) tablet 5 mg  5 mg Oral Daily Dixie Dials, MD   5 mg at 12/16/19 1002  . atorvastatin (LIPITOR) tablet 20 mg  20 mg Oral q1800 Dixie Dials, MD   20 mg at 12/15/19 1712  . azithromycin (ZITHROMAX) 500 mg in sodium chloride 0.9 % 250 mL IVPB  500 mg Intravenous Q24H [name], MD   Stopped at 12/15/19 1949  . cefTRIAXone (ROCEPHIN) 2 g in sodium chloride 0.9 % 100 mL IVPB  2 g Intravenous Q24H [name], MD 200 mL/hr at 12/16/19 1227 2 g at 12/16/19 1227  . Chlorhexidine Gluconate Cloth 2 % PADS 6 each  6 each Topical Q0600 [name], MD   6 each at 12/16/19 0452  . diclofenac Sodium (VOLTAREN) 1 % topical gel 2 g  2 g Topical TID PRN Dixie Dials, MD   2 g at 12/16/19 1004  .  ferrous sulfate tablet 325 mg  325 mg Oral TID WC [name], MD   325 mg at 12/16/19 1225  . [START ON 12/17/2019] furosemide (LASIX) tablet 40 mg  40 mg Oral Daily [name], MD      . guaiFENesin-dextromethorphan (ROBITUSSIN DM) 100-10 MG/5ML syrup 5 mL  5 mL Oral Q4H PRN [name], MD   5 mL at 12/16/19 1225  . hydrALAZINE (APRESOLINE) tablet 10 mg  10 mg Oral BID Dixie Dials, MD   10 mg at 12/16/19 1002  . levalbuterol (XOPENEX) nebulizer solution 0.63 mg  0.63 mg Nebulization Q6H PRN [name], MD   0.63 mg at 12/16/19 1155  . levalbuterol (XOPENEX) nebulizer solution 1.25 mg  1.25 mg Nebulization BID [name], MD   1.25 mg at 12/16/19 0907  .  levothyroxine (SYNTHROID) tablet 75 mcg  75 mcg Oral Q0600 Dixie Dials, MD   75 mcg at 12/16/19 0615  . mupirocin ointment (BACTROBAN) 2 % 1 application  1 application Nasal BID [name], MD   1 application at 40/98/11 1002  . pantoprazole (PROTONIX) injection 40 mg  40 mg Intravenous Q12H [name], MD   40 mg at 12/16/19 1002  . polyethylene glycol (MIRALAX / GLYCOLAX) packet 17 g  17 g Oral Daily PRN [name], MD      . potassium chloride (KLOR-CON) CR tablet 20 mEq  20 mEq Oral Daily [name], MD   20 mEq at 12/16/19 1433     Discharge Medications: Please see discharge summary for a list of discharge medications.  Relevant Imaging Results:  Relevant Lab Results:   Additional Information SSN: [id number]  Alberteen Sam, LCSW

## 2019-12-16 NOTE — TOC Initial Note (Addendum)
Transition of Care [facility]) - Initial/Assessment Note    Patient Details  Name: Monique Lin MRN: [id number] Date of Birth: October 13, 1927  [facility]) CM/SW Contact:    Alberteen Sam,  Phone Number: [phone] 12/16/2019, 11:23 AM  Clinical Narrative:                  Update 3:30: CSW spoke with patient's daughter [name] who reports preference for [facility] and agreeable for CSW to send referral, pending bed offer at this time.   CSW spoke with patient at bedside regarding PT recommendation of SNF for short term rehab. Patient agreeable however states her daughter will be involved in decision making of which facility and that her daughter has a preference in mind, however patient unable to recall facility name. Reports her daughter will be at hospital around 2:30 pm for CSW to come back.   CSW has also lvm with daughter [name], pending call back.  Expected Discharge Plan: Skilled Nursing Facility Barriers to Discharge: Continued Medical Work up   Patient Goals and CMS Choice Patient states their goals for this hospitalization and ongoing recovery are:: to go to rehab CMS Medicare.gov Compare Post Acute Care list provided to:: Patient Choice offered to / list presented to : Patient  Expected Discharge Plan and Services Expected Discharge Plan: [place] Choice: [place] arrangements for the past 2 months: Single Family Home                                      Prior Living Arrangements/Services Living arrangements for the past 2 months: Single Family Home Lives with:: Adult Children Patient language and need for interpreter reviewed:: Yes Do you feel safe going back to the place where you live?: Yes      Need for Family Participation in Patient Care: Yes (Comment) Care giver support system in place?: Yes (comment)   Criminal Activity/Legal Involvement Pertinent to Current  Situation/Hospitalization: No - Comment as needed  Activities of Daily Living Home Assistive Devices/Equipment: Cane (specify quad or straight), Nebulizer, Walker (specify type) ADL Screening (condition at time of admission) Patient's cognitive ability adequate to safely complete daily activities?: Yes Is the patient deaf or have difficulty hearing?: No Does the patient have difficulty seeing, even when wearing glasses/contacts?: No Does the patient have difficulty concentrating, remembering, or making decisions?: No Patient able to express need for assistance with ADLs?: Yes Does the patient have difficulty dressing or bathing?: No Independently performs ADLs?: Yes (appropriate for developmental age) Does the patient have difficulty walking or climbing stairs?: Yes Weakness of Legs: Both Weakness of Arms/Hands: Both  Permission Sought/Granted Permission sought to share information with : Case Manager, Customer service manager, Family Supports Permission granted to share information with : Yes, Verbal Permission Granted  Share Information with NAME: [name]  Permission granted to share info w AGENCY: SNFs  Permission granted to share info w Relationship: daughter  Permission granted to share info w Contact Information: [phone]  Emotional Assessment Appearance:: Appears stated age Attitude/Demeanor/Rapport: Gracious Affect (typically observed): Calm Orientation: : Oriented to Self, Oriented to Place, Oriented to  Time, Oriented to Situation Alcohol / Substance Use: Not Applicable Psych Involvement: No (comment)  Admission diagnosis:  Cardiac arrest ([place]) [I46.9] Acute GI bleeding [K92.2] Symptomatic anemia [D64.9] Community acquired pneumonia of right lung, unspecified part of lung [J18.9]  Patient Active Problem List   Diagnosis Date Noted  . Acute GI bleeding 12/11/2019  . CAP (community acquired pneumonia)   . Empyema ([place])   . Pleural effusion, left   . Left  lower lobe pneumonia 04/01/2018  . Bronchitis    PCP:  [name], MD Pharmacy:   [facility], [place] [address] [place] [address] Phone: [phone] Fax: [phone]  [id number] [place], [place] - [place] [address] [place] [address] Phone: [phone] Fax: [phone]     Social Determinants of Health (SDOH) Interventions    Readmission Risk Interventions No flowsheet data found.

## 2019-12-16 NOTE — Progress Notes (Signed)
Subjective:  Patient denies any chest pain.  Complains of coughing with chest congestion.  Denies any fever or chills.  States had BM yesterday.  Overall feels better  Objective:  Vital Signs in the last 24 hours: Temp:  [97.8 F (36.6 C)-99.5 F (37.5 C)] 97.8 F (36.6 C) (01/12 0812) Pulse Rate:  [87-105] 97 (01/12 0910) Resp:  [17-25] 18 (01/12 0910) BP: (138-159)/(64-86) 159/86 (01/12 0812) SpO2:  [96 %-100 %] 98 % (01/12 1156)  Intake/Output from previous day: 01/11 0701 - 01/12 0700 In: 1150 [P.O.:450; IV Piggyback:700] Out: 1300 [Urine:1300] Intake/Output from this shift: No intake/output data recorded.  Physical Exam: Neck: no adenopathy, no carotid bruit, no JVD and supple, symmetrical, trachea midline Lungs: decreased breath sounds at bases Heart: regular rate and rhythm and S1, S2 normal Abdomen: soft, non-tender; bowel sounds normal; no masses,  no organomegaly  Lab Results: Recent Labs    12/15/19 0136 12/16/19 0221  WBC 12.7* 14.9*  HGB 8.0* 8.6*  PLT 222 254   Recent Labs    12/15/19 0136 12/16/19 0221  NA 138 137  K 3.5 4.2  CL 109 105  CO2 21* 22  GLUCOSE 108* 105*  BUN 13 10  CREATININE 1.01* 0.98   No results for input(s): TROPONINI in the last 72 hours.  Invalid input(s): CK, MB Hepatic Function Panel Recent Labs    12/14/19 0100  ALBUMIN 2.5*   No results for input(s): CHOL in the last 72 hours. No results for input(s): PROTIME in the last 72 hours.  Imaging: Imaging results have been reviewed and CXR PA and lateral  Result Date: 12/16/2019 CLINICAL DATA:  Pneumonia. Shortness of breath. EXAM: CHEST - 2 VIEW COMPARISON:  12/11/2019 FINDINGS: Cardiomegaly remains stable. New small left pleural effusion is seen with increased atelectasis or infiltrate in the left retrocardiac lung base. Tiny right pleural effusion also noted, with atelectasis or infiltrate in the right cardiophrenic angle. Aortic atherosclerosis incidentally noted.  IMPRESSION: New bilateral pleural effusions and bibasilar atelectasis versus infiltrates, left side greater than right. Electronically Signed   By: Marlaine Hind M.D.   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 Restart Lasix40 mg IV 1 and then 40 mg by mouth daily k-Dur 20 mEq daily. Social services for discharge planning Increase ambulation as tolerated. Out of bed to chair.  LOS: 5 days    [name] 12/16/2019, 12:30 PM

## 2019-12-17 LAB — CULTURE, BLOOD (ROUTINE X 2)
Culture: NO GROWTH
Culture: NO GROWTH
Special Requests: ADEQUATE

## 2019-12-17 MED ORDER — CARVEDILOL 6.25 MG PO TABS
6.2500 mg | ORAL_TABLET | Freq: Two times a day (BID) | ORAL | Status: DC
  Administered 2019-12-17 – 2019-12-22 (×10): 6.25 mg via ORAL
  Filled 2019-12-17 (×10): qty 1

## 2019-12-17 MED ORDER — FLEET ENEMA 7-19 GM/118ML RE ENEM
1.0000 | ENEMA | Freq: Once | RECTAL | Status: AC
  Administered 2019-12-17: 1 via RECTAL
  Filled 2019-12-17: qty 1

## 2019-12-17 MED ORDER — PANTOPRAZOLE SODIUM 40 MG PO TBEC
40.0000 mg | DELAYED_RELEASE_TABLET | Freq: Two times a day (BID) | ORAL | Status: DC
  Administered 2019-12-17 – 2019-12-22 (×10): 40 mg via ORAL
  Filled 2019-12-17 (×11): qty 1

## 2019-12-17 MED ORDER — FUROSEMIDE 10 MG/ML IJ SOLN
40.0000 mg | Freq: Once | INTRAMUSCULAR | Status: AC
  Administered 2019-12-17: 17:00:00 40 mg via INTRAVENOUS
  Filled 2019-12-17: qty 4

## 2019-12-17 MED ORDER — ACETAMINOPHEN 500 MG PO TABS
500.0000 mg | ORAL_TABLET | ORAL | Status: DC | PRN
  Administered 2019-12-17 – 2019-12-22 (×7): 500 mg via ORAL
  Filled 2019-12-17 (×7): qty 1

## 2019-12-17 MED ORDER — ALUM & MAG HYDROXIDE-SIMETH 200-200-20 MG/5ML PO SUSP
30.0000 mL | Freq: Four times a day (QID) | ORAL | Status: DC | PRN
  Administered 2019-12-17: 30 mL via ORAL
  Filled 2019-12-17: qty 30

## 2019-12-17 NOTE — Progress Notes (Signed)
complained of cramping abdominal. pain after eating lunch with onion, MD aware tylenol and Maalox given., continue to monitor.

## 2019-12-17 NOTE — Progress Notes (Signed)
Still complaining of abdominal discomfort despite maalox and tylenol given. MD made aware and gave order for fleet enema tonight.

## 2019-12-17 NOTE — Progress Notes (Signed)
Subjective:  Denies any chest pain.  States breathing is improved occasional episodes of tachycardia with hydralazine.  Objective:  Vital Signs in the last 24 hours: Temp:  [98.3 F (36.8 C)-98.9 F (37.2 C)] 98.3 F (36.8 C) (01/13 1225) Pulse Rate:  [86-95] 95 (01/13 1225) Resp:  [12-21] 21 (01/13 1225) BP: (106-144)/(47-74) 129/47 (01/13 1225) SpO2:  [95 %-100 %] 95 % (01/13 1558)  Intake/Output from previous day: 01/12 0701 - 01/13 0700 In: 720 [P.O.:720] Out: 1350 [Urine:1350] Intake/Output from this shift: Total I/O In: 240 [P.O.:240] Out: -   Physical Exam: Neck: no adenopathy, no carotid bruit, no JVD and supple, symmetrical, trachea midline Lungs: decreased breath sounds at bases.  Air entry has improved Heart: regular rate and rhythm, S1, S2 normal and soft systolic murmur noted Abdomen: soft, non-tender; bowel sounds normal; no masses,  no organomegaly Extremities: extremities normal, atraumatic, no cyanosis or edema  Lab Results: Recent Labs    12/15/19 0136 12/16/19 0221  WBC 12.7* 14.9*  HGB 8.0* 8.6*  PLT 222 254   Recent Labs    12/15/19 0136 12/16/19 0221  NA 138 137  K 3.5 4.2  CL 109 105  CO2 21* 22  GLUCOSE 108* 105*  BUN 13 10  CREATININE 1.01* 0.98   No results for input(s): TROPONINI in the last 72 hours.  Invalid input(s): CK, MB Hepatic Function Panel No results for input(s): PROT, ALBUMIN, AST, ALT, ALKPHOS, BILITOT, BILIDIR, IBILI in the last 72 hours. No results for input(s): CHOL in the last 72 hours. No results for input(s): PROTIME in the last 72 hours.  Imaging: Imaging results have been reviewed and CXR PA and lateral  Result Date: 12/16/2019 CLINICAL DATA:  Pneumonia. Shortness of breath. EXAM: CHEST - 2 VIEW COMPARISON:  12/11/2019 FINDINGS: Cardiomegaly remains stable. New small left pleural effusion is seen with increased atelectasis or infiltrate in the left retrocardiac lung base. Tiny right pleural effusion also  noted, with atelectasis or infiltrate in the right cardiophrenic angle. Aortic atherosclerosis incidentally noted. IMPRESSION: New bilateral pleural effusions and bibasilar atelectasis versus infiltrates, left side greater than right. Electronically Signed   By: Marlaine Hind M.D.   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 Change IV Rocephin to by mouth Augmentin. Change IV Protonix to by mouth Awaiting skilled nursing facility  LOS: 6 days    Monique Lin 12/17/2019, 4:27 PM

## 2019-12-17 NOTE — TOC Progression Note (Addendum)
Transition of Care [facility]) - Progression Note    Patient Details  Name: JANARIA MCCAMMON MRN: [id number] Date of Birth: 1927/11/02  Transition of Care [facility]) CM/SW [place], [place] Phone Number: [phone] 12/17/2019, 2:46 PM  Clinical Narrative:     CSW updated patient's daughter [name] regarding [facility] not accepting new patients at this time. She reports the facility chosen must have 4 or more starts according to Medicare.gov site and be in [place]. CSW attempted to inform her that no [facility] SNFs have 4 or more stars, would need to expand search to facilities such as [facility] and other areas. Daughter continues to want [facility] 4 star or more facility.   CSW has faxed out to 4 star or more facilities, although none are in [place]. Will inform daughter [name] of bed offers from these facilities. CSW has texted higher rating facilities for beds this has included:  [facility] : no new admissions due to covid [facility] : no new admissions due to covid [facility]: full - no new admits [facility]: no new admissions due to [employer]   Pending responses.   Expected Discharge Plan: [place] Barriers to Discharge: Continued Medical Work up  Expected Discharge Plan and Services Expected Discharge Plan: [place] Choice: [place] arrangements for the past 2 months: Single Family Home                                       Social Determinants of Health (SDOH) Interventions    Readmission Risk Interventions No flowsheet data found.

## 2019-12-18 ENCOUNTER — Inpatient Hospital Stay (HOSPITAL_COMMUNITY): Payer: Medicare Other

## 2019-12-18 LAB — CBC
HCT: 28 % — ABNORMAL LOW (ref 36.0–46.0)
Hemoglobin: 9 g/dL — ABNORMAL LOW (ref 12.0–15.0)
MCH: 28.6 pg (ref 26.0–34.0)
MCHC: 32.1 g/dL (ref 30.0–36.0)
MCV: 88.9 fL (ref 80.0–100.0)
Platelets: 283 10*3/uL (ref 150–400)
RBC: 3.15 MIL/uL — ABNORMAL LOW (ref 3.87–5.11)
RDW: 17.5 % — ABNORMAL HIGH (ref 11.5–15.5)
WBC: 11 10*3/uL — ABNORMAL HIGH (ref 4.0–10.5)
nRBC: 0 % (ref 0.0–0.2)

## 2019-12-18 MED ORDER — DOXYCYCLINE HYCLATE 100 MG PO TABS
100.0000 mg | ORAL_TABLET | Freq: Two times a day (BID) | ORAL | Status: DC
  Administered 2019-12-18 – 2019-12-22 (×8): 100 mg via ORAL
  Filled 2019-12-18 (×8): qty 1

## 2019-12-18 MED ORDER — FERROUS SULFATE 325 (65 FE) MG PO TABS
325.0000 mg | ORAL_TABLET | Freq: Two times a day (BID) | ORAL | Status: DC
  Administered 2019-12-18 – 2019-12-22 (×8): 325 mg via ORAL
  Filled 2019-12-18 (×7): qty 1

## 2019-12-18 NOTE — Progress Notes (Signed)
Noted with audible wheezing while washing her up, xopenex neb given with relief. Continue to monitor.

## 2019-12-18 NOTE — Progress Notes (Signed)
Subjective:  Patient denies any chest pain or shortness of breath.  Denies any fever or chills.  Denies abdominal pain.  States had BM yesterday.    Objective:  Vital Signs in the last 24 hours: Temp:  [97.3 F (36.3 C)-98.3 F (36.8 C)] 98.3 F (36.8 C) (01/14 1557) Pulse Rate:  [85-87] 85 (01/14 1557) Resp:  [15-27] 19 (01/14 1557) BP: (122-141)/(49-85) 141/59 (01/14 1557) SpO2:  [95 %-98 %] 97 % (01/14 1557)  Intake/Output from previous day: 01/13 0701 - 01/14 0700 In: 240 [P.O.:240] Out: 1000 [Urine:1000] Intake/Output from this shift: Total I/O In: 150 [P.O.:150] Out: 550 [Urine:550]  Physical Exam: Neck: no adenopathy, no carotid bruit, no JVD and supple, symmetrical, trachea midline Lungs: decreased breath sounds at bases Heart: regular rate and rhythm, S1, S2 normal and soft systolic murmur noted Abdomen: soft, non-tender; bowel sounds normal; no masses,  no organomegaly Extremities: extremities normal, atraumatic, no cyanosis or edema  Lab Results: Recent Labs    12/16/19 0221 12/18/19 0215  WBC 14.9* 11.0*  HGB 8.6* 9.0*  PLT 254 283   Recent Labs    12/16/19 0221  NA 137  K 4.2  CL 105  CO2 22  GLUCOSE 105*  BUN 10  CREATININE 0.98   No results for input(s): TROPONINI in the last 72 hours.  Invalid input(s): CK, MB Hepatic Function Panel No results for input(s): PROT, ALBUMIN, AST, ALT, ALKPHOS, BILITOT, BILIDIR, IBILI in the last 72 hours. No results for input(s): CHOL in the last 72 hours. No results for input(s): PROTIME in the last 72 hours.  Imaging: Imaging results have been reviewed and No results found.  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 Check chest x-ray results. Ambulate as tolerated. Out of bed to chair. Awaiting skilled nursing facility  LOS: 7 days    [name] 12/18/2019, 4:41 PM

## 2019-12-19 NOTE — Progress Notes (Signed)
Subjective:  Patient denies any chest pain or shortness of breath.  Objective:  Vital Signs in the last 24 hours: Temp:  [97.8 F (36.6 C)-99.1 F (37.3 C)] 98.2 F (36.8 C) (01/15 1146) Pulse Rate:  [80-91] 80 (01/15 1146) Resp:  [15-31] 18 (01/15 1146) BP: (132-149)/(53-71) 132/53 (01/15 1146) SpO2:  [93 %-98 %] 98 % (01/15 1146)  Intake/Output from previous day: 01/14 0701 - 01/15 0700 In: 510 [P.O.:510] Out: 550 [Urine:550] Intake/Output from this shift: Total I/O In: 240 [P.O.:240] Out: 450 [Urine:450]  Physical Exam: Neck: no adenopathy, no carotid bruit, no JVD and supple, symmetrical, trachea midline Lungs: decreased breath sounds at bases.  Air entry has improved Heart: regular rate and rhythm, S1, S2 normal and soft systolic murmur noted Abdomen: soft, non-tender; bowel sounds normal; no masses,  no organomegaly Extremities: extremities normal, atraumatic, no cyanosis or edema  Lab Results: Recent Labs    12/18/19 0215  WBC 11.0*  HGB 9.0*  PLT 283   No results for input(s): NA, K, CL, CO2, GLUCOSE, BUN, CREATININE in the last 72 hours. No results for input(s): TROPONINI in the last 72 hours.  Invalid input(s): CK, MB Hepatic Function Panel No results for input(s): PROT, ALBUMIN, AST, ALT, ALKPHOS, BILITOT, BILIDIR, IBILI in the last 72 hours. No results for input(s): CHOL in the last 72 hours. No results for input(s): PROTIME in the last 72 hours.  Imaging: Imaging results have been reviewed and CXR PA and lateral  Result Date: 12/18/2019 CLINICAL DATA:  Follow-up pneumonia EXAM: CHEST - 2 VIEW COMPARISON:  12/16/2019, 12/11/2019 FINDINGS: Small bilateral pleural effusions which do not appear significantly changed. Persistent left greater than right basilar consolidations. Enlarged cardiomediastinal silhouette with aortic atherosclerosis. No pneumothorax. IMPRESSION: Similar small left greater than right pleural effusions with basilar atelectasis or  pneumonia. Mild cardiomegaly. Electronically Signed   By: Donavan Foil M.D.   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 Awaiting skilled nursing facility  LOS: 8 days    [name] 12/19/2019, 1:10 PM

## 2019-12-19 NOTE — Progress Notes (Signed)
Pt OOB for 3 hr period this evening when her daughter visited. Assist X 2, pt utilized walker in short pivot. No change in H.R. noted. Pt c/o chronic arthritis. Tylenol given prior to mobilization. Pt reports her knees felt okay but it was difficult to move.

## 2019-12-20 MED ORDER — ENOXAPARIN SODIUM 30 MG/0.3ML ~~LOC~~ SOLN
30.0000 mg | Freq: Every day | SUBCUTANEOUS | Status: DC
  Administered 2019-12-20: 30 mg via SUBCUTANEOUS
  Filled 2019-12-20: qty 0.3

## 2019-12-20 MED ORDER — FLEET ENEMA 7-19 GM/118ML RE ENEM
1.0000 | ENEMA | Freq: Once | RECTAL | Status: AC
  Administered 2019-12-20: 1 via RECTAL
  Filled 2019-12-20: qty 1

## 2019-12-20 NOTE — Progress Notes (Signed)
Pt has not had BM for couple of days, and feels likes she needs to have one and not able to.  Notified primary MD and received order for fleets enema, that Pt has requested (Miralax does not work for the Pt, per the daughter).

## 2019-12-20 NOTE — Progress Notes (Signed)
Subjective:  Complains of musculoskeletal pain states was up in chair for 2 hours yesterday feels weak.  Denies any chest pain or shortness of breath.  Objective:  Vital Signs in the last 24 hours: Temp:  [97.3 F (36.3 C)-98.2 F (36.8 C)] 97.3 F (36.3 C) (01/16 0902) Pulse Rate:  [79-88] 85 (01/16 0908) Resp:  [11-22] 18 (01/16 0908) BP: (124-138)/(53-82) 129/67 (01/16 0902) SpO2:  [96 %-100 %] 97 % (01/16 0908)  Intake/Output from previous day: 01/15 0701 - 01/16 0700 In: 830 [P.O.:830] Out: 450 [Urine:450] Intake/Output from this shift: No intake/output data recorded.  Physical Exam: Neck: no adenopathy, no carotid bruit, no JVD and supple, symmetrical, trachea midline Lungs: Decreased breath sounds at bases Heart: regular rate and rhythm, S1, S2 normal and Soft systolic murmur noted Abdomen: soft, non-tender; bowel sounds normal; no masses,  no organomegaly Extremities: extremities normal, atraumatic, no cyanosis or edema  Lab Results: Recent Labs    12/18/19 0215  WBC 11.0*  HGB 9.0*  PLT 283   No results for input(s): NA, K, CL, CO2, GLUCOSE, BUN, CREATININE in the last 72 hours. No results for input(s): TROPONINI in the last 72 hours.  Invalid input(s): CK, MB Hepatic Function Panel No results for input(s): PROT, ALBUMIN, AST, ALT, ALKPHOS, BILITOT, BILIDIR, IBILI in the last 72 hours. No results for input(s): CHOL in the last 72 hours. No results for input(s): PROTIME in the last 72 hours.  Imaging: Imaging results have been reviewed and CXR PA and lateral  Result Date: 12/18/2019 CLINICAL DATA:  Follow-up pneumonia EXAM: CHEST - 2 VIEW COMPARISON:  12/16/2019, 12/11/2019 FINDINGS: Small bilateral pleural effusions which do not appear significantly changed. Persistent left greater than right basilar consolidations. Enlarged cardiomediastinal silhouette with aortic atherosclerosis. No pneumothorax. IMPRESSION: Similar small left greater than right pleural  effusions with basilar atelectasis or pneumonia. Mild cardiomegaly. Electronically Signed   By: Donavan Foil M.D.   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

## 2019-12-20 NOTE — Plan of Care (Signed)
  Problem: Education: Goal: Knowledge of General Education information will improve Description: Including pain rating scale, medication(s)/side effects and non-pharmacologic comfort measures Outcome: Progressing   Problem: Clinical Measurements: Goal: Ability to maintain clinical measurements within normal limits will improve Outcome: Progressing Goal: Will remain free from infection Outcome: Progressing Goal: Diagnostic test results will improve Outcome: Progressing Goal: Respiratory complications will improve Outcome: Progressing Goal: Cardiovascular complication will be avoided Outcome: Progressing   Problem: Activity: Goal: Risk for activity intolerance will decrease Outcome: Progressing   Problem: Elimination: Goal: Will not experience complications related to bowel motility Outcome: Progressing Goal: Will not experience complications related to urinary retention Outcome: Progressing   Problem: Pain Managment: Goal: General experience of comfort will improve Outcome: Progressing   Problem: Safety: Goal: Ability to remain free from injury will improve Outcome: Progressing   Problem: Skin Integrity: Goal: Risk for impaired skin integrity will decrease Outcome: Progressing   Problem: Education: Goal: Ability to identify signs and symptoms of gastrointestinal bleeding will improve Outcome: Progressing   Problem: Bowel/Gastric: Goal: Will show no signs and symptoms of gastrointestinal bleeding Outcome: Progressing   Problem: Fluid Volume: Goal: Will show no signs and symptoms of excessive bleeding Outcome: Progressing   Problem: Clinical Measurements: Goal: Complications related to the disease process, condition or treatment will be avoided or minimized Outcome: Progressing

## 2019-12-20 NOTE — Evaluation (Addendum)
Physical Therapy Evaluation Patient Details Name: Monique Lin MRN: [id number] DOB: Jul 28, 1927 Today's Date: 12/20/2019   History of Present Illness  86 yo female admited to ED on 1/7 with x2 periods of unresponsiveness requiring CPR, suspect cardiac arrest vs syncopal episode. Pt found to have upper GIB. PMH includes bronchitis, HTN, HLD, GI tumor, empyema, CAD. Pt with episode of vagal inhibition on 1/9 with significant bradycardia.  Clinical Impression  Pt presents to PT with deficits in functional mobility, gait, balance, power, endurance, strength. Pt is generally weak at this time with reduced capacity for mobility, often citing rib soreness from recent coughing spells. Pt requires verbal cues form PT for transfer technique, but is able to perform with limited assistance. Pt will benefit from continued acute PT services to improve activity tolerance and reduce falls risk. PT recommends pt mobilize from bed to recliner and attempt ambulation with assistance from staff at least 3 times per day. PT also recommends pt perform sit to stand HEP with assistance of family or staff at recliner with use of RW. Pt will benefit from at least one additional PT session prior to discharge from the acute setting.    Follow Up Recommendations Home health PT;Supervision/Assistance - 24 hour(family declining SNF at this time)    Equipment Recommendations  Wheelchair (measurements PT);Wheelchair cushion (measurements PT);3in1 (PT)    Recommendations for Other Services OT consult     Precautions / Restrictions Precautions Precautions: Fall Restrictions Weight Bearing Restrictions: No      Mobility  Bed Mobility Overal bed mobility: Needs Assistance Bed Mobility: Supine to Sit     Supine to sit: Mod assist     General bed mobility comments: pt pulling on PT to scoot toward edge of bed  Transfers Overall transfer level: Needs assistance Equipment used: Rolling walker (2 wheeled) Transfers: Sit  to/from Stand Sit to Stand: Min assist;Min guard(minA progressing to minG)         General transfer comment: pt transferring to and from recliner and bedside commoder multiple times during session  Ambulation/Gait Ambulation/Gait assistance: Min guard Gait Distance (Feet): 8 Feet Assistive device: Rolling walker (2 wheeled) Gait Pattern/deviations: Step-to pattern Gait velocity: reduced Gait velocity interpretation: <1.8 ft/sec, indicate of risk for recurrent falls General Gait Details: short step to gait with increased trunk flexion  Stairs            Wheelchair Mobility    Modified Rankin (Stroke Patients Only)       Balance Overall balance assessment: Needs assistance Sitting-balance support: Feet supported;Single extremity supported Sitting balance-Leahy Scale: Good Sitting balance - Comments: close supervision   Standing balance support: Bilateral upper extremity supported Standing balance-Leahy Scale: Fair Standing balance comment: minG with BUE support of RW                             Pertinent Vitals/Pain Pain Assessment: Faces Faces Pain Scale: Hurts whole lot Pain Location: ribs Pain Descriptors / Indicators: Sore Pain Intervention(s): Limited activity within patient's tolerance    Home Living Family/patient expects to be discharged to:: Private residence Living Arrangements: Children Available Help at Discharge: Family;Available 24 hours/day Type of Home: House Home Access: Stairs to enter Entrance Stairs-Rails: Psychiatric nurse of Steps: 2 Home Layout: One level Home Equipment: Walker - 2 wheels;Cane - single point;Walker - 4 wheels      Prior Function Level of Independence: Needs assistance   Gait / Transfers Assistance Needed: independent  household ambulator  ADL's / Homemaking Assistance Needed: Pt reports she receives assist with cooking, cleaning, dressing, bathing from daughters  Comments: daughter  living with patient works during day, other daughter lives very close     Journalist, newspaper   Dominant Hand: Right    Extremity/Trunk Assessment   Upper Extremity Assessment Upper Extremity Assessment: Generalized weakness    Lower Extremity Assessment Lower Extremity Assessment: Generalized weakness    Cervical / Trunk Assessment Cervical / Trunk Assessment: Kyphotic  Communication   Communication: No difficulties  Cognition Arousal/Alertness: Awake/alert Behavior During Therapy: WFL for tasks assessed/performed Overall Cognitive Status: Within Functional Limits for tasks assessed                                        General Comments General comments (skin integrity, edema, etc.): pt mildly tachy at end of session during final transfer up to 120. Sats stable of room air    Exercises General Exercises - Lower Extremity Ankle Circles/Pumps: AROM;Both;10 reps Quad Sets: AROM;5 reps;Both Gluteal Sets: AROM;Both;5 reps   Assessment/Plan    PT Assessment Patient needs continued PT services  PT Problem List Decreased strength;Decreased activity tolerance;Decreased balance;Decreased mobility;Decreased knowledge of use of DME;Decreased safety awareness;Decreased knowledge of precautions       PT Treatment Interventions DME instruction;Gait training;Stair training;Functional mobility training;Therapeutic activities;Therapeutic exercise;Balance training;Neuromuscular re-education;Patient/family education    PT Goals (Current goals can be found in the Care Plan section)  Acute Rehab PT Goals Patient Stated Goal: to return home safely PT Goal Formulation: With patient/family Time For Goal Achievement: 01/03/20 Potential to Achieve Goals: Good    Frequency Min 3X/week   Barriers to discharge        Co-evaluation               AM-PAC PT "6 Clicks" Mobility  Outcome Measure Help needed turning from your back to your side while in a flat bed without  using bedrails?: A Little Help needed moving from lying on your back to sitting on the side of a flat bed without using bedrails?: A Lot Help needed moving to and from a bed to a chair (including a wheelchair)?: A Little Help needed standing up from a chair using your arms (e.g., wheelchair or bedside chair)?: A Little Help needed to walk in hospital room?: A Little Help needed climbing 3-5 steps with a railing? : Total 6 Click Score: 15    End of Session Equipment Utilized During Treatment: (none) Activity Tolerance: Patient tolerated treatment well Patient left: in chair;with call bell/phone within reach;with family/visitor present Nurse Communication: Mobility status PT Visit Diagnosis: Muscle weakness (generalized) (M62.81);Unsteadiness on feet (R26.81)    Time: 6433-2951 PT Time Calculation (min) (ACUTE ONLY): 71 min   Charges:   PT Evaluation $PT Eval Moderate Complexity: 1 Mod PT Treatments $Gait Training: 8-22 mins $Therapeutic Activity: 8-22 mins        Zenaida Niece, PT, DPT Acute Rehabilitation Pager: [phone]   Zenaida Niece 12/20/2019, 6:41 PM

## 2019-12-21 LAB — CBC
HCT: 27.1 % — ABNORMAL LOW (ref 36.0–46.0)
Hemoglobin: 8.4 g/dL — ABNORMAL LOW (ref 12.0–15.0)
MCH: 28.2 pg (ref 26.0–34.0)
MCHC: 31 g/dL (ref 30.0–36.0)
MCV: 90.9 fL (ref 80.0–100.0)
Platelets: 319 10*3/uL (ref 150–400)
RBC: 2.98 MIL/uL — ABNORMAL LOW (ref 3.87–5.11)
RDW: 17.1 % — ABNORMAL HIGH (ref 11.5–15.5)
WBC: 9.4 10*3/uL (ref 4.0–10.5)
nRBC: 0 % (ref 0.0–0.2)

## 2019-12-21 LAB — BASIC METABOLIC PANEL
Anion gap: 12 (ref 5–15)
BUN: 33 mg/dL — ABNORMAL HIGH (ref 8–23)
CO2: 26 mmol/L (ref 22–32)
Calcium: 9.6 mg/dL (ref 8.9–10.3)
Chloride: 101 mmol/L (ref 98–111)
Creatinine, Ser: 1.82 mg/dL — ABNORMAL HIGH (ref 0.44–1.00)
GFR calc Af Amer: 28 mL/min — ABNORMAL LOW (ref >60)
GFR calc non Af Amer: 24 mL/min — ABNORMAL LOW (ref >60)
Glucose, Bld: 107 mg/dL — ABNORMAL HIGH (ref 70–99)
Potassium: 4.4 mmol/L (ref 3.5–5.1)
Sodium: 139 mmol/L (ref 135–145)

## 2019-12-21 MED ORDER — BISACODYL 5 MG PO TBEC: 5.0000 mg | DELAYED_RELEASE_TABLET | Freq: Every day | ORAL | Status: DC | PRN

## 2019-12-21 NOTE — TOC Progression Note (Signed)
Transition of Care [facility]) - Progression Note    Patient Details  Name: Monique Lin MRN: [id number] Date of Birth: 1927/10/17  Transition of Care [facility]) CM/SW [place], [place] Phone Number: 12/21/2019, 1:55 PM  Clinical Narrative:   CSW spoke with patient's daughter, [name], to discuss possible change of plans in going home with home health. Per [name], patient was able to work with PT and did well, so they are hopeful that after allowing her more time to get up and move more before discharge that they could take her home, which she would greatly prefer over sending her to SNF. [name] expressed frustration over how the patient has not been mobilized, even though MD has been saying that she should be mobilized. CSW sent the RN a message to relay daughter's request for patient to be up in the chair today. CSW spoke with [name] about taking patient home and needs for home, including home health and DME. Patient will need a wheelchair ordered and 3N1, awaiting any additional DME recs from OT today. [name] has already reached out to [facility] to be set up for home health services, and requested that CSW speak with someone she knows with [facility], Lattie Haw, and provided number. [name] hopeful that the patient can mobilize more to feel more confident about returning home, as she is worried that the patient will go home too early and then end up back at the hospital. [name] is also working on setting up aide services for the patient at home, and CSW discussed with [name] about researching Aide and Attendant through the [place] for care services. CSW to follow.    Expected Discharge Plan: [place] Barriers to Discharge: Continued Medical Work up  Expected Discharge Plan and Services Expected Discharge Plan: [place] Choice: [place] arrangements for the past 2 months: Single Family Home                                       Social Determinants of Health (SDOH) Interventions    Readmission Risk Interventions No flowsheet data found.

## 2019-12-21 NOTE — Plan of Care (Signed)
  Problem: Education: Goal: Knowledge of General Education information will improve Description: Including pain rating scale, medication(s)/side effects and non-pharmacologic comfort measures Outcome: Progressing   Problem: Clinical Measurements: Goal: Ability to maintain clinical measurements within normal limits will improve Outcome: Progressing Goal: Will remain free from infection Outcome: Progressing Goal: Diagnostic test results will improve Outcome: Progressing Goal: Respiratory complications will improve Outcome: Progressing Goal: Cardiovascular complication will be avoided Outcome: Progressing   Problem: Activity: Goal: Risk for activity intolerance will decrease Outcome: Progressing   Problem: Elimination: Goal: Will not experience complications related to bowel motility Outcome: Progressing Goal: Will not experience complications related to urinary retention Outcome: Progressing   Problem: Pain Managment: Goal: General experience of comfort will improve Outcome: Progressing   Problem: Safety: Goal: Ability to remain free from injury will improve Outcome: Progressing   Problem: Skin Integrity: Goal: Risk for impaired skin integrity will decrease Outcome: Progressing   Problem: Education: Goal: Ability to identify signs and symptoms of gastrointestinal bleeding will improve Outcome: Progressing   Problem: Bowel/Gastric: Goal: Will show no signs and symptoms of gastrointestinal bleeding Outcome: Progressing   Problem: Fluid Volume: Goal: Will show no signs and symptoms of excessive bleeding Outcome: Progressing   Problem: Clinical Measurements: Goal: Complications related to the disease process, condition or treatment will be avoided or minimized Outcome: Progressing

## 2019-12-21 NOTE — TOC Progression Note (Signed)
Transition of Care [facility]) - Progression Note    Patient Details  Name: Monique Lin MRN: [id number] Date of Birth: [birth year]  Transition of Care [facility]) CM/SW [place], [place] Phone Number: 12/21/2019, 2:05 PM  Clinical Narrative:   CSW spoke with [name] again about SNF placement, and she indicated that she has been researching the options and will be doing a drive by over the weekend to look at each option. [name] said that her pastor has also possibly visited [facility], and she wanted to ask him about what he thought. CSW provided information about each facility's visitation policy, and [name] appreciative of information. [name] asked CSW to find out about window rooms available when patient is ready to DC, and CSW to follow up. CSW will speak with [name] over the weekend for update.    Expected Discharge Plan: [place] Barriers to Discharge: Continued Medical Work up  Expected Discharge Plan and Services Expected Discharge Plan: [place] Choice: [place] arrangements for the past 2 months: Single Family Home                                       Social Determinants of Health (SDOH) Interventions    Readmission Risk Interventions No flowsheet data found.

## 2019-12-21 NOTE — Progress Notes (Signed)
Subjective:  Patient denies any chest pain or shortness of breath.  Objective:  Vital Signs in the last 24 hours: Temp:  [97.5 F (36.4 C)-98.3 F (36.8 C)] 97.6 F (36.4 C) (01/17 0819) Pulse Rate:  [80-96] 96 (01/17 0819) Resp:  [16-25] 16 (01/17 0819) BP: (126-137)/(56-84) 126/59 (01/17 0819) SpO2:  [93 %-99 %] 97 % (01/17 0819)  Intake/Output from previous day: 01/16 0701 - 01/17 0700 In: 430 [P.O.:430] Out: 1050 [Urine:1050] Intake/Output from this shift: No intake/output data recorded.  Physical Exam: Neck: no adenopathy, no carotid bruit, no JVD and supple, symmetrical, trachea midline Lungs: decreased breath sounds at bases.  No rhonchi or rales Heart regular rate and rhythm, S1, S2 normal.  Soft systolic murmur noted. Abdomen soft, nontender. Extremities no clubbing, cyanosis or edema Lab Results: Recent Labs    12/21/19 0215  WBC 9.4  HGB 8.4*  PLT 319   Recent Labs    12/21/19 0215  NA 139  K 4.4  CL 101  CO2 26  GLUCOSE 107*  BUN 33*  CREATININE 1.82*   No results for input(s): TROPONINI in the last 72 hours.  Invalid input(s): CK, MB Hepatic Function Panel No results for input(s): PROT, ALBUMIN, AST, ALT, ALKPHOS, BILITOT, BILIDIR, IBILI in the last 72 hours. No results for input(s): CHOL in the last 72 hours. No results for input(s): PROTIME in the last 72 hours.  Imaging: Imaging results have been reviewed and No results found.  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 DC Lovenox. SCDs. Rx for constipation Increase ambulation as tolerated. Awaiting disposition.   Family  deciding regarding skilled nursing facility.  LOS: 10 days    [name] 12/21/2019, 10:13 AM

## 2019-12-21 NOTE — TOC Progression Note (Signed)
LATE NOTE ENTRY    Transition of Care [facility]) - Progression Note    Patient Details  Name: Monique Lin MRN: [id number] Date of Birth: 1927/03/14  Transition of Care [facility]) CM/SW [place], [place] Phone Number: 12/21/2019, 2:01 PM  Clinical Narrative:   CSW spoke with patient's daughter, Monique Lin, at length about SNF recommendation. Monique Lin expressed disappointment in the selection of facilities available, and CSW discussed barriers related to SNF placement in the area (a number of SNF are currently experiencing COVID outbreaks, and it is hard to find any SNF beds in [place] at this time), and Monique Lin was in agreement to possibly looking at SNFs outside of [place]. CSW discussed with Monique Lin options in [place] that had accepted the patient, [place] and [facility]. CSW provided Medicare ratings for both, and Monique Lin in agreement to research them to determine a choice. Sherry asked CSW to obtain information about each facility's visitation policy, as that will play a big role in her decision. CSW to follow back up with Monique Lin.    Expected Discharge Plan: [place] Barriers to Discharge: Continued Medical Work up  Expected Discharge Plan and Services Expected Discharge Plan: [place] Choice: [place] arrangements for the past 2 months: Single Family Home                                       Social Determinants of Health (SDOH) Interventions    Readmission Risk Interventions No flowsheet data found.

## 2019-12-22 MED ORDER — FERROUS SULFATE 325 (65 FE) MG PO TABS: 325.0000 mg | ORAL_TABLET | Freq: Two times a day (BID) | ORAL | 3 refills | Status: DC

## 2019-12-22 MED ORDER — PANTOPRAZOLE SODIUM 40 MG PO TBEC: 40.0000 mg | DELAYED_RELEASE_TABLET | Freq: Two times a day (BID) | ORAL | 2 refills | Status: DC

## 2019-12-22 MED ORDER — ACETAMINOPHEN 500 MG PO TABS: 500.0000 mg | ORAL_TABLET | ORAL | 0 refills | Status: DC | PRN

## 2019-12-22 MED ORDER — BISACODYL 5 MG PO TBEC: 5.0000 mg | DELAYED_RELEASE_TABLET | Freq: Every day | ORAL | 0 refills | Status: AC | PRN

## 2019-12-22 MED ORDER — CARVEDILOL 6.25 MG PO TABS: 6.2500 mg | ORAL_TABLET | Freq: Two times a day (BID) | ORAL | 3 refills | Status: DC

## 2019-12-22 MED ORDER — DOXYCYCLINE HYCLATE 100 MG PO TABS: 100.0000 mg | ORAL_TABLET | Freq: Two times a day (BID) | ORAL | 0 refills | Status: DC

## 2019-12-22 MED ORDER — ALUM & MAG HYDROXIDE-SIMETH 200-200-20 MG/5ML PO SUSP: 30.0000 mL | Freq: Four times a day (QID) | ORAL | 0 refills | Status: AC | PRN

## 2019-12-22 NOTE — Progress Notes (Signed)
Discharge instructions reviewed with patient and daughter.  Wheelchair delivered to room.  Patient dressed and out the door with daughter and bedside commode as well

## 2019-12-22 NOTE — TOC Transition Note (Signed)
Transition of Care [facility]) - CM/SW Discharge Note   Patient Details  Name: Monique Lin MRN: [id number] Date of Birth: December 14, 1926  Transition of Care [facility]) CM/SW Contact:  Zenon Mayo, RN Phone Number: 12/22/2019, 12:40 PM   Clinical Narrative:      Per previous CSW note , patient is set up with [facility],  [place] contacted [place] with [facility], she states she does not see where patient is set up , NCM made the referral for HHRN, HHPT, [place], [place] and Education officer, museum.  [place] states they can take patient. Soc will begin 24 to 48 hrs post dc.  Patient will also need light weight w/chair and 3 n 1.  NCM made referral to [place] with [employer], they will bring 3 n 1 to patient room and deliver the w/chair to patient home.  MD spoke with MD to get orders for [facility] and to co sign the DME narrative.    Final next level of care: [place] Barriers to Discharge: No Barriers Identified   Patient Goals and CMS Choice Patient states their goals for this hospitalization and ongoing recovery are:: to go to rehab CMS Medicare.gov Compare Post Acute Care list provided to:: Patient Choice offered to / list presented to : Adult Children(from previous note)  Discharge Placement                       Discharge Plan and Services     Post Acute Care Choice: [place]          DME Arranged: Lightweight manual wheelchair with seat cushion, 3-N-1 DME Agency: [employer] Date DME Agency Contacted: 12/22/19 Time DME Agency Contacted: [id number] Representative spoke with at DME Agency: zack HH Arranged: RN, PT, OT, Nurse's Aide, Social Work [employer] Agency: [facility] Date [place]: 12/22/19 Time [place]: 42 Representative spoke with at [place]: [place] ([place]) Interventions     Readmission Risk Interventions No flowsheet data found.

## 2019-12-22 NOTE — Discharge Summary (Signed)
NAME: Monique Lin, Monique Lin MEDICAL RECORD [id number] ACCOUNT [id number] DATE OF BIRTH:03/17/1927 FACILITY: [facility] LOCATION: [facility] PHYSICIAN:Nasier Thumm Daivd Council, MD  DISCHARGE SUMMARY  DATE OF DISCHARGE:  12/22/2019  ADMITTING DIAGNOSES: 1.  Status post questionable cardiac arrest/near 2 rule out cardiac arrhythmias. 2.  Acute upper gastrointestinal bleed. 3.  Acute on chronic anemia secondary to above. 4.  Left lower lobe pneumonia. 5.  Hypertension. 6.  Hyperlipidemia. 7.  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 8.  History of gastrointestinal bleed in the past. 9.  Hypothyroidism. 10.  Acute kidney injury.  FINAL DIAGNOSES: 1.  Status post probable syncope due to vasovagal episode. 2.  Status post acute upper gastrointestinal bleed.   3.  Status post acute on chronic anemia secondary to above. 4.  Resolving left lower lobe pneumonia. 5.  Hypertension. 6.  Hyperlipidemia. 7.  Acute kidney injury secondary to dehydration/cardiorenal syndrome. 8.  History of gastrointestinal stromal tumor resection in the past. 9.  History of gastrointestinal bleed in the past. 10.  Hypothyroidism.  DISCHARGE HOME MEDICATIONS:   1.  Tylenol 500 mg 4 times daily as needed.   2.  Maalox 30 mL every 6 hours as needed. 3.  Dulcolax 5 mg 1 tablet daily as needed for constipation. 4.  Doxycycline 100 mg twice daily for 5 more days. 5.  Ferrous sulfate 325 mg 1 tablet twice daily. 6.  Protonix 40 mg twice daily. 7.  Albuterol inhaler 1 puff every 6 hours as needed. 8.  Albuterol by nebulizer 2.5 mg every 6 hours as needed for wheezing. 9.  Amlodipine 5 mg 1 tablet daily. 10.  Atorvastatin 20 mg daily. 11.  Flonase 50 mcg nasal spray as needed. 12.  Folic acid 1 mg daily. 13.  Levothyroxine 50 mcg daily. 14.  Vitamin B12 500 mcg daily. 15.  Vitamin D3 one capsule daily. 16.  Carvedilol dose has been reduced to 6.25 mg 1 tablet twice daily.   17.  The patient has been advised to stop  aspirin, gabapentin and MiraLax.  DIET:  Low salt, low cholesterol as tolerated.  ACTIVITY:  Increase activity slowly as tolerated.  Home health aide and PT has been arranged as outpatient.  CONDITION AT DISCHARGE:  Stable.  BRIEF HISTORY AND HOSPITAL COURSE:  The patient is a 86 year old female with past medical history significant for hypertension, hyperlipidemia, hypothyroidism, history of gastrointestinal stromal tumor in the past, history of gastrointestinal bleed in  the past, anemia of chronic disease, history of left lower lobe pneumonia/empyema in the past.  She came to the ER by EMS following questionable cardiac arrest at home.  The patient had CPR for 1 minute with return of spontaneous circulation.  The  patient was noted to have generalized weakness and near syncopal episode.  The patient denies falling down.  Denies any seizure activity.  Denies chest pain, palpitations prior to questionable syncopal episode.  As per daughter, the patient has been  having chronic constipation and black stool for the last few days associated with generalized weakness.  In the ED, the patient was noted to have hemoglobin of 4.1.  Chest x-ray showed left lower lobe infiltrate and a mildly elevated white count.  The  patient denies any fever, chills, was tested recently for COVID-19, which was negative.  The patient had chronic cough associated with postnasal drip.  Pan cultures were obtained in the ED and was started on Rocephin and Zithromax.  The patient was also  ordered packed RBCs blood  transfusion in the ED.  PHYSICAL EXAMINATION: GENERAL:  She was alert, awake, oriented. VITAL SIGNS:  Blood pressure was 119/43, pulse 84.  She was afebrile. HEENT:  Conjunctivae pale.  Sclerae are anicteric. NECK:  Supple, no JVD, no bruit. LUNGS:  Decreased breath sound at bases with left basilar rhonchi noted. CARDIOVASCULAR:  S1, S2 was normal.  There was soft systolic murmur. ABDOMEN:  Soft.  Bowel sounds  are present, nontender. EXTREMITIES:  There is no clubbing, cyanosis or edema.  LABORATORY DATA:  Her sodium was 141, potassium 4.0, glucose was slightly elevated at 137, BUN 24, creatinine 1.39.  Two sets of troponin I were negative.  Lactic acid level was 2.3, repeat was 2.2, which was elevated.  Hemoglobin was 4.1, hematocrit  14.4, MCV was 98.  Her white count of 12.2.  Repeat hemoglobin after 2 units of packed RBC, hemoglobin was 7.7, hematocrit 24.1, white count of 11.1.  On 12/14/2019 repeat CBC, hemoglobin was 6.9, hematocrit 21.2, requiring 1 unit of packed RBC.   Post-transfusion, her hemoglobin has remained above 8.  Last hemoglobin was 8.4, hematocrit 27.1, white count of 9.4.  Last electrolytes, sodium 139, potassium 4.4, BUN 33, creatinine has gone up to 1.82.  Repeat chest x-ray showed small bilateral  pleural effusion and bibasilar atelectasis, left greater than the right.  BRIEF HOSPITAL COURSE:  The patient was admitted to step-down unit.  MI was ruled out by serial enzymes and EKG.  The patient had 1 episode of vasovagal near syncopal episode while straining with marked bradycardia.  No further episodes of bradycardia  were noted during the hospital stay.  Her beta blocker dose has been reduced.  OT, PT consultation was obtained.  The patient was also started on broad spectrum antibiotics, which was switched to p.o. doxycycline.  The patient has remained afebrile  during the hospital stay.  The patient did receive a total of 3 units of packed RBCs.  Also received IV fluids in the ED.  Was started on Lasix with good diuresis.  The patient has ambulated in the hallway yesterday and earlier today.  Initially, the  patient's family wanted the patient to go to skilled nursing facility, but then her daughter decided to take her mother home with home health aide and physical therapy.  Clinically, the patient's exam has improved.  Her lung sounds are clear.  Chest  x-ray did show some  bilateral small pleural effusion due to post therapeutic gap.  We will repeat the x-ray and lab work as outpatient in few weeks.  CN/NUANCE D:12/22/2019 T:12/22/2019 JOB:[id number]

## 2019-12-22 NOTE — Discharge Summary (Signed)
Discharge summary dictated on 12/22/2019, dictation number is [phone]

## 2019-12-22 NOTE — Progress Notes (Signed)
    Durable Medical Equipment  (From admission, onward)         Start     Ordered   12/22/19 1035  For home use only DME lightweight manual wheelchair with seat cushion  Once    Comments: Patient suffers from weakness which impairs their ability to perform daily activities like bathing and dressing in the home.  A walker or cane will not resolve  issue with performing activities of daily living. A wheelchair will allow patient to safely perform daily activities. Patient is not able to propel themselves in the home using a standard weight wheelchair due to weakness. Patient can self propel in the lightweight wheelchair. Length of need lifetime. Accessories: elevating leg rests (ELRs), wheel locks, extensions and anti-tippers.   12/22/19 1038   12/22/19 1033  For home use only DME 3 n 1  Once     12/22/19 1033

## 2019-12-22 NOTE — Progress Notes (Signed)
Physical Therapy Treatment Patient Details Name: Monique Lin MRN: [id number] DOB: 1927/02/28 Today's Date: 12/22/2019    History of Present Illness 86 yo female admited to ED on 1/7 with x2 periods of unresponsiveness requiring CPR, suspect cardiac arrest vs syncopal episode. Pt found to have upper GIB. PMH includes bronchitis, HTN, HLD, GI tumor, empyema, CAD. Pt with episode of vagal inhibition on 1/9 with significant bradycardia.    PT Comments    Pt tolerated treatment well with much improved activity tolerance and gait quality. Pt demonstrates the ability to ambulate for household distances with use of RW and close supervision. Pt continues to require minimal physical assistance to perform bed mobility at this time due to generalized weakness. Pt will benefit from continued acute PT services to improve general strength, balance, and activity tolerance.  Pt and family refusing SNF recommendation however pt is progressing well with acute PT services. At time of discharge home PT recommends assistance for all functional mobility and use of RW for all OOB activity.  Follow Up Recommendations  Home health PT;Supervision/Assistance - 24 hour     Equipment Recommendations  3in1 (PT)(pt already owns RW)    Recommendations for Other Services       Precautions / Restrictions Precautions Precautions: Fall Restrictions Weight Bearing Restrictions: No    Mobility  Bed Mobility Overal bed mobility: Needs Assistance Bed Mobility: Supine to Sit;Rolling Rolling: Min assist   Supine to sit: Min assist        Transfers Overall transfer level: Needs assistance Equipment used: Rolling walker (2 wheeled) Transfers: Sit to/from Stand Sit to Stand: Min guard            Ambulation/Gait Ambulation/Gait assistance: Min guard(minG to close supervision) Gait Distance (Feet): 30 Feet(30' x 2) Assistive device: Rolling walker (2 wheeled) Gait Pattern/deviations: Step-to pattern Gait  velocity: reduced Gait velocity interpretation: <1.8 ft/sec, indicate of risk for recurrent falls General Gait Details: pt with shortened step to gait with reduced step length and increased trunk flexion   Stairs             Wheelchair Mobility    Modified Rankin (Stroke Patients Only)       Balance Overall balance assessment: Needs assistance Sitting-balance support: Single extremity supported;Feet supported Sitting balance-Leahy Scale: Good Sitting balance - Comments: supervision   Standing balance support: Bilateral upper extremity supported Standing balance-Leahy Scale: Fair Standing balance comment: minG-close supervision with RW                            Cognition Arousal/Alertness: Awake/alert Behavior During Therapy: WFL for tasks assessed/performed Overall Cognitive Status: Within Functional Limits for tasks assessed                                        Exercises      General Comments General comments (skin integrity, edema, etc.): VSS on RA      Pertinent Vitals/Pain Pain Assessment: No/denies pain    Home Living                      Prior Function            PT Goals (current goals can now be found in the care plan section) Acute Rehab PT Goals Patient Stated Goal: to return home safely Progress towards PT goals: Progressing toward  goals    Frequency    Min 3X/week      PT Plan Current plan remains appropriate    Co-evaluation              AM-PAC PT "6 Clicks" Mobility   Outcome Measure  Help needed turning from your back to your side while in a flat bed without using bedrails?: A Little Help needed moving from lying on your back to sitting on the side of a flat bed without using bedrails?: A Little Help needed moving to and from a bed to a chair (including a wheelchair)?: A Little Help needed standing up from a chair using your arms (e.g., wheelchair or bedside chair)?: A Little Help  needed to walk in hospital room?: A Little Help needed climbing 3-5 steps with a railing? : A Lot 6 Click Score: 17    End of Session Equipment Utilized During Treatment: (none) Activity Tolerance: Patient tolerated treatment well Patient left: in chair;with call bell/phone within reach Nurse Communication: Mobility status PT Visit Diagnosis: Muscle weakness (generalized) (M62.81);Unsteadiness on feet (R26.81)     Time: 5051-8335 PT Time Calculation (min) (ACUTE ONLY): 35 min  Charges:  $Gait Training: 8-22 mins $Therapeutic Activity: 8-22 mins                     Zenaida Niece, PT, DPT Acute Rehabilitation Pager: [phone]    Zenaida Niece 12/22/2019, 12:13 PM

## 2019-12-22 NOTE — Evaluation (Signed)
Occupational Therapy Evaluation Patient Details Name: Monique Lin MRN: [id number] DOB: February 03, 1927 Today's Date: 12/22/2019    History of Present Illness 86 yo female admited to ED on 1/7 with x2 periods of unresponsiveness requiring CPR, suspect cardiac arrest vs syncopal episode. Pt found to have upper GIB. PMH includes bronchitis, HTN, HLD, GI tumor, empyema, CAD. Pt with episode of vagal inhibition on 1/9 with significant bradycardia.   Clinical Impression   This 86 y/o female presents with the above. PTA pt reports daughter typically assists ADL tasks. Pt presenting with generalized weakness and fatigue, presents seated in recliner. Pt requiring minA for functional transfers using RW, currently requiring minA for seated UB ADL and mod-maxA for LB ADL. Pt fatigued from sitting up (reports approx 2hrs this AM), though overall tolerating session/mobility well. VSS throughout. Pt will benefit from continued acute OT services and recommend follow up [facility] services after discharge to maximize her safety and independence with ADL and mobility. Will follow.     Follow Up Recommendations  Home health OT;Supervision/Assistance - 24 hour    Equipment Recommendations  3 in 1 bedside commode           Precautions / Restrictions Precautions Precautions: Fall Restrictions Weight Bearing Restrictions: No      Mobility Bed Mobility Overal bed mobility: Needs Assistance Bed Mobility: Sit to Supine       Sit to supine: Mod assist   General bed mobility comments: requires assist for LEs to return to supine  Transfers Overall transfer level: Needs assistance Equipment used: Rolling walker (2 wheeled) Transfers: Sit to/from [facility] Sit to Stand: Min assist Stand pivot transfers: Min assist       General transfer comment: boosting and steadying assist to rise to RW, steadying assist to take steps to EOB from recliner    Balance Overall balance assessment: Needs  assistance Sitting-balance support: Single extremity supported;Feet supported Sitting balance-Leahy Scale: Good Sitting balance - Comments: supervision   Standing balance support: Bilateral upper extremity supported Standing balance-Leahy Scale: Poor Standing balance comment: reliant on UE support                           ADL either performed or assessed with clinical judgement   ADL Overall ADL's : Needs assistance/impaired Eating/Feeding: Set up;Sitting   Grooming: Set up;Supervision/safety;Sitting   Upper Body Bathing: Minimal assistance;Sitting   Lower Body Bathing: Moderate assistance;Sit to/from stand   Upper Body Dressing : Minimal assistance;Sitting   Lower Body Dressing: Moderate assistance;Maximal assistance;Sit to/from stand Lower Body Dressing Details (indicate cue type and reason): minA for sit<>stand Toilet Transfer: Minimal assistance;Stand-pivot;RW Toilet Transfer Details (indicate cue type and reason): simulated via transfer from recliner to [place] and Hygiene: Moderate assistance;Sit to/from stand       Functional mobility during ADLs: Minimal assistance;Rolling walker General ADL Comments: pt with weakness, generalized fatigue     Vision         Perception     Praxis      Pertinent Vitals/Pain Pain Assessment: Faces Faces Pain Scale: Hurts a little bit Pain Location: generalized Pain Descriptors / Indicators: Discomfort Pain Intervention(s): Limited activity within patient's tolerance;Monitored during session;Repositioned     Hand Dominance Right   Extremity/Trunk Assessment Upper Extremity Assessment Upper Extremity Assessment: Generalized weakness   Lower Extremity Assessment Lower Extremity Assessment: Defer to PT evaluation   Cervical / Trunk Assessment Cervical / Trunk Assessment: Kyphotic   Communication  Communication Communication: No difficulties   Cognition Arousal/Alertness:  Awake/alert Behavior During Therapy: WFL for tasks assessed/performed Overall Cognitive Status: Within Functional Limits for tasks assessed                                     General Comments  VSS on RA    Exercises General Exercises - Lower Extremity Ankle Circles/Pumps: AROM;Both;10 reps Quad Sets: AROM;Both;10 reps   Shoulder Instructions      Home Living Family/patient expects to be discharged to:: Private residence Living Arrangements: Children Available Help at Discharge: Family;Available 24 hours/day Type of Home: House Home Access: Stairs to enter [employer] of Steps: 2 Entrance Stairs-Rails: Right;Left Home Layout: One level     Bathroom Shower/Tub: Teacher, early years/pre: Standard     Home Equipment: Environmental consultant - 2 wheels;Cane - single point;Walker - 4 wheels          Prior Functioning/Environment Level of Independence: Needs assistance  Gait / Transfers Assistance Needed: independent household ambulator ADL's / Homemaking Assistance Needed: Pt reports she receives assist with cooking, cleaning, dressing, bathing from daughters            OT Problem List: Decreased strength;Decreased range of motion;Decreased activity tolerance;Impaired balance (sitting and/or standing);Decreased safety awareness;Decreased knowledge of use of DME or AE;Cardiopulmonary status limiting activity;Obesity      OT Treatment/Interventions: Self-care/ADL training;Energy conservation;DME and/or AE instruction;Therapeutic exercise;Therapeutic activities;Patient/family education;Balance training    OT Goals(Current goals can be found in the care plan section) Acute Rehab OT Goals Patient Stated Goal: to return home safely OT Goal Formulation: With patient Time For Goal Achievement: 01/05/20 Potential to Achieve Goals: Good  OT Frequency: Min 2X/week   Barriers to D/C:            Co-evaluation              AM-PAC OT "6 Clicks"  Daily Activity     Outcome Measure Help from another person eating meals?: A Little Help from another person taking care of personal grooming?: A Little Help from another person toileting, which includes using toliet, bedpan, or urinal?: A Lot Help from another person bathing (including washing, rinsing, drying)?: A Lot Help from another person to put on and taking off regular upper body clothing?: A Little Help from another person to put on and taking off regular lower body clothing?: A Lot 6 Click Score: 15   End of Session Equipment Utilized During Treatment: Gait belt;Rolling walker Nurse Communication: Mobility status  Activity Tolerance: Patient tolerated treatment well Patient left: in bed;with call bell/phone within reach;with bed alarm set  OT Visit Diagnosis: Unsteadiness on feet (R26.81);Muscle weakness (generalized) (M62.81)                Time: 4356-8616 OT Time Calculation (min): 38 min Charges:  OT General Charges $OT Visit: 1 Visit OT Evaluation $OT Eval Moderate Complexity: 1 Mod OT Treatments $Self Care/Home Management : 8-22 mins  Lou Cal, OT [facility] Pager [phone] Office [phone]   Raymondo Band 12/22/2019, 2:37 PM

## 2019-12-22 NOTE — Discharge Instructions (Signed)
Community-Acquired Pneumonia, Adult Pneumonia is a type of lung infection that causes swelling in the airways of the lungs. Mucus and fluid may also build up inside the airways. This may cause coughing and difficulty breathing. There are different types of pneumonia. One type can develop while a person is in a hospital. A different type is called community-acquired pneumonia. It develops in people who are not, and have not recently been, in the hospital or another type of health care facility. What are the causes? This condition may be caused by:  Viruses. This is the most common cause of pneumonia.  Bacteria. Community-acquired pneumonia is often caused by Streptococcus pneumoniae bacteria. These bacteria are often passed from one person to another by breathing in droplets from the cough or sneeze of an infected person.  Fungi. This is the least common cause of pneumonia. What increases the risk? The following factors may make you more likely to develop this condition:  Having a chronic disease, such as chronic obstructive pulmonary disease (COPD), asthma, congestive heart failure, cystic fibrosis, diabetes, or kidney disease.  Having early-stage or late-stage HIV.  Having sickle cell disease.  Having had your spleen removed (splenectomy).  Having poor dental hygiene.  Having a medical condition that increases the risk of breathing in (aspirating) secretions from your own mouth and nose.  Having a weakened body defense system (immune system).  Being a smoker.  Traveling to areas where pneumonia-causing germs commonly exist.  Being around animal habitats or animals that have pneumonia-causing germs, including birds, bats, rabbits, cats, and farm animals. What are the signs or symptoms? Symptoms of this condition include:  A dry cough.  A wet (productive) cough.  Fever.  Sweating.  Chest pain, especially when breathing deeply or coughing.  Rapid breathing or difficulty  breathing.  Shortness of breath.  Shaking chills.  Fatigue.  Muscle aches. How is this diagnosed? This condition may be diagnosed based on:  Your medical history.  A physical exam. You may also have tests, including:  Chest X-rays.  Tests of your blood oxygen level and other blood gases.  Tests on blood, mucus (sputum), fluid around your lungs (pleural fluid), and urine. If your pneumonia is severe, other tests may be done to find the exact cause of your illness. How is this treated? Treatment for this condition depends on many factors, such as the cause of your pneumonia, the medicines you take, and other medical conditions that you have. For most adults, treatment and recovery from pneumonia may occur at home. In some cases, treatment must happen in a hospital. Treatment may include:  Medicines that are given by mouth or through an IV, including: ? Antibiotic medicines, if the pneumonia was caused by bacteria. ? Antiviral medicines, if the pneumonia was caused by a virus.  Being given extra oxygen.  Respiratory therapy. Although rare, treating severe pneumonia may include:  Using a machine to help you breathe (mechanical ventilation). This is done if you are not breathing well on your own and you cannot maintain a safe blood oxygen level.  Thoracentesis. This is a procedure to remove fluid from around one lung or both lungs to help you breathe better. Follow these instructions at home:  Medicines  Take over-the-counter and prescription medicines only as told by your health care provider. ? Only take cough medicine if you are losing sleep. Be aware that cough medicine can prevent your body's natural ability to remove mucus from your lungs.  If you were prescribed an antibiotic  medicine, take it as told by your health care provider. Do not stop taking the antibiotic even if you start to feel better. General instructions  Sleep in a semi-upright position at night. Try  sleeping in a reclining chair, or place a few pillows under your head.  Rest as needed and get at least 8 hours of sleep each night.  Drink enough water to keep your urine pale yellow. This will help to thin out mucus secretions in your lungs.  Eat a healthy diet that includes plenty of vegetables, fruits, whole grains, low-fat dairy products, and lean protein.  Do not use any products that contain nicotine or tobacco, such as cigarettes, e-cigarettes, and chewing tobacco. If you need help quitting, ask your health care provider.  Keep all follow-up visits as told by your health care provider. This is important. How is this prevented? You can lower your risk of developing community-acquired pneumonia by:  Getting a pneumococcal vaccine. There are different types and schedules of pneumococcal vaccines. Ask your health care provider which option is best for you. Consider getting the vaccine if: ? You are older than 84 years of age. ? You are older than 84 years of age and are undergoing cancer treatment, have chronic lung disease, or have other medical conditions that affect your immune system. Ask your health care provider if this applies to you.  Getting an influenza vaccine every year. Ask your health care provider which type of vaccine is best for you.  Getting regular checkups from your dentist.  Washing your hands often. If soap and water are not available, use hand sanitizer. Contact a health care provider if:  You have a fever.  You are losing sleep because you cannot control your cough with cough medicine. Get help right away if:  You have worsening shortness of breath.  You have increased chest pain.  Your sickness becomes worse, especially if you are an older adult or have a weakened immune system.  You cough up blood. Summary  Pneumonia is an infection of the lungs.  Community-acquired pneumonia develops in people who have not been in the hospital. It can be caused  by bacteria, viruses, or fungi.  This condition may be treated with antibiotics or antiviral medicines.  Severe cases may require hospitalization, mechanical ventilation, and other procedures to drain fluid from the lungs. This information is not intended to replace advice given to you by your health care provider. Make sure you discuss any questions you have with your health care provider. Document Revised: 07/18/2018 Document Reviewed: 07/18/2018 Elsevier Patient Education  [place].   Anemia  Anemia is a condition in which you do not have enough red blood cells or hemoglobin. Hemoglobin is a substance in red blood cells that carries oxygen. When you do not have enough red blood cells or hemoglobin (are anemic), your body cannot get enough oxygen and your organs may not work properly. As a result, you may feel very tired or have other problems. What are the causes? Common causes of anemia include:  Excessive bleeding. Anemia can be caused by excessive bleeding inside or outside the body, including bleeding from the intestine or from periods in women.  Poor nutrition.  Long-lasting (chronic) kidney, thyroid, and liver disease.  Bone marrow disorders.  Cancer and treatments for cancer.  HIV (human immunodeficiency virus) and AIDS (acquired immunodeficiency syndrome).  Treatments for HIV and AIDS.  Spleen problems.  Blood disorders.  Infections, medicines, and autoimmune disorders that destroy  red blood cells. What are the signs or symptoms? Symptoms of this condition include:  Minor weakness.  Dizziness.  Headache.  Feeling heartbeats that are irregular or faster than normal (palpitations).  Shortness of breath, especially with exercise.  Paleness.  Cold sensitivity.  Indigestion.  Nausea.  Difficulty sleeping.  Difficulty concentrating. Symptoms may occur suddenly or develop slowly. If your anemia is mild, you may not have symptoms. How is this  diagnosed? This condition is diagnosed based on:  Blood tests.  Your medical history.  A physical exam.  Bone marrow biopsy. Your health care provider may also check your stool (feces) for blood and may do additional testing to look for the cause of your bleeding. You may also have other tests, including:  Imaging tests, such as a CT scan or MRI.  Endoscopy.  Colonoscopy. How is this treated? Treatment for this condition depends on the cause. If you continue to lose a lot of blood, you may need to be treated at a hospital. Treatment may include:  Taking supplements of iron, vitamin Y07, or folic acid.  Taking a hormone medicine (erythropoietin) that can help to stimulate red blood cell growth.  Having a blood transfusion. This may be needed if you lose a lot of blood.  Making changes to your diet.  Having surgery to remove your spleen. Follow these instructions at home:  Take over-the-counter and prescription medicines only as told by your health care provider.  Take supplements only as told by your health care provider.  Follow any diet instructions that you were given.  Keep all follow-up visits as told by your health care provider. This is important. Contact a health care provider if:  You develop new bleeding anywhere in the body. Get help right away if:  You are very weak.  You are short of breath.  You have pain in your abdomen or chest.  You are dizzy or feel faint.  You have trouble concentrating.  You have bloody or black, tarry stools.  You vomit repeatedly or you vomit up blood. Summary  Anemia is a condition in which you do not have enough red blood cells or enough of a substance in your red blood cells that carries oxygen (hemoglobin).  Symptoms may occur suddenly or develop slowly.  If your anemia is mild, you may not have symptoms.  This condition is diagnosed with blood tests as well as a medical history and physical exam. Other tests  may be needed.  Treatment for this condition depends on the cause of the anemia. This information is not intended to replace advice given to you by your health care provider. Make sure you discuss any questions you have with your health care provider. Document Revised: 11/02/2017 Document Reviewed: 12/22/2016 Elsevier Patient Education  [place].

## 2020-01-09 ENCOUNTER — Inpatient Hospital Stay (HOSPITAL_COMMUNITY)
Admission: EM | Admit: 2020-01-09 | Discharge: 2020-01-16 | DRG: 439 | Disposition: A | Discharge status: Home Health | Payer: Medicare Other | Attending: Student | Admitting: Student

## 2020-01-09 ENCOUNTER — Encounter (HOSPITAL_COMMUNITY): Payer: Self-pay

## 2020-01-09 ENCOUNTER — Other Ambulatory Visit: Payer: Self-pay

## 2020-01-09 ENCOUNTER — Emergency Department (HOSPITAL_COMMUNITY): Payer: Medicare Other

## 2020-01-09 DIAGNOSIS — C49A2 Gastrointestinal stromal tumor of stomach: Secondary | ICD-10-CM | POA: Diagnosis present

## 2020-01-09 DIAGNOSIS — Z79899 Other long term (current) drug therapy: Secondary | ICD-10-CM

## 2020-01-09 DIAGNOSIS — K851 Biliary acute pancreatitis without necrosis or infection: Secondary | ICD-10-CM | POA: Diagnosis present

## 2020-01-09 DIAGNOSIS — I130 Hypertensive heart and chronic kidney disease with heart failure and stage 1 through stage 4 chronic kidney disease, or unspecified chronic kidney disease: Secondary | ICD-10-CM | POA: Diagnosis present

## 2020-01-09 DIAGNOSIS — D490 Neoplasm of unspecified behavior of digestive system: Secondary | ICD-10-CM

## 2020-01-09 DIAGNOSIS — K8050 Calculus of bile duct without cholangitis or cholecystitis without obstruction: Secondary | ICD-10-CM | POA: Diagnosis present

## 2020-01-09 DIAGNOSIS — L89152 Pressure ulcer of sacral region, stage 2: Secondary | ICD-10-CM | POA: Diagnosis present

## 2020-01-09 DIAGNOSIS — Z7189 Other specified counseling: Secondary | ICD-10-CM | POA: Diagnosis not present

## 2020-01-09 DIAGNOSIS — E039 Hypothyroidism, unspecified: Secondary | ICD-10-CM | POA: Diagnosis present

## 2020-01-09 DIAGNOSIS — Z20822 Contact with and (suspected) exposure to covid-19: Secondary | ICD-10-CM | POA: Diagnosis present

## 2020-01-09 DIAGNOSIS — R7401 Elevation of levels of liver transaminase levels: Secondary | ICD-10-CM | POA: Diagnosis present

## 2020-01-09 DIAGNOSIS — Z885 Allergy status to narcotic agent status: Secondary | ICD-10-CM | POA: Diagnosis not present

## 2020-01-09 DIAGNOSIS — J40 Bronchitis, not specified as acute or chronic: Secondary | ICD-10-CM | POA: Diagnosis present

## 2020-01-09 DIAGNOSIS — E669 Obesity, unspecified: Secondary | ICD-10-CM | POA: Diagnosis present

## 2020-01-09 DIAGNOSIS — E785 Hyperlipidemia, unspecified: Secondary | ICD-10-CM | POA: Diagnosis present

## 2020-01-09 DIAGNOSIS — R5381 Other malaise: Secondary | ICD-10-CM

## 2020-01-09 DIAGNOSIS — N1831 Chronic kidney disease, stage 3a: Secondary | ICD-10-CM | POA: Diagnosis present

## 2020-01-09 DIAGNOSIS — D509 Iron deficiency anemia, unspecified: Secondary | ICD-10-CM | POA: Diagnosis present

## 2020-01-09 DIAGNOSIS — G629 Polyneuropathy, unspecified: Secondary | ICD-10-CM | POA: Diagnosis not present

## 2020-01-09 DIAGNOSIS — R05 Cough: Secondary | ICD-10-CM | POA: Diagnosis not present

## 2020-01-09 DIAGNOSIS — D649 Anemia, unspecified: Secondary | ICD-10-CM

## 2020-01-09 DIAGNOSIS — I5032 Chronic diastolic (congestive) heart failure: Secondary | ICD-10-CM | POA: Diagnosis present

## 2020-01-09 DIAGNOSIS — R748 Abnormal levels of other serum enzymes: Secondary | ICD-10-CM | POA: Diagnosis not present

## 2020-01-09 DIAGNOSIS — Z515 Encounter for palliative care: Secondary | ICD-10-CM | POA: Diagnosis not present

## 2020-01-09 DIAGNOSIS — D630 Anemia in neoplastic disease: Secondary | ICD-10-CM | POA: Diagnosis present

## 2020-01-09 DIAGNOSIS — K219 Gastro-esophageal reflux disease without esophagitis: Secondary | ICD-10-CM | POA: Diagnosis present

## 2020-01-09 DIAGNOSIS — Z79890 Hormone replacement therapy (postmenopausal): Secondary | ICD-10-CM

## 2020-01-09 DIAGNOSIS — R0982 Postnasal drip: Secondary | ICD-10-CM | POA: Diagnosis present

## 2020-01-09 DIAGNOSIS — Z8674 Personal history of sudden cardiac arrest: Secondary | ICD-10-CM | POA: Diagnosis not present

## 2020-01-09 DIAGNOSIS — I10 Essential (primary) hypertension: Secondary | ICD-10-CM | POA: Diagnosis present

## 2020-01-09 DIAGNOSIS — Z8701 Personal history of pneumonia (recurrent): Secondary | ICD-10-CM

## 2020-01-09 DIAGNOSIS — N179 Acute kidney failure, unspecified: Secondary | ICD-10-CM | POA: Diagnosis present

## 2020-01-09 DIAGNOSIS — I129 Hypertensive chronic kidney disease with stage 1 through stage 4 chronic kidney disease, or unspecified chronic kidney disease: Secondary | ICD-10-CM | POA: Diagnosis not present

## 2020-01-09 DIAGNOSIS — Z6832 Body mass index (BMI) 32.0-32.9, adult: Secondary | ICD-10-CM | POA: Diagnosis not present

## 2020-01-09 DIAGNOSIS — Z880 Allergy status to penicillin: Secondary | ICD-10-CM

## 2020-01-09 DIAGNOSIS — E876 Hypokalemia: Secondary | ICD-10-CM | POA: Diagnosis not present

## 2020-01-09 DIAGNOSIS — N189 Chronic kidney disease, unspecified: Secondary | ICD-10-CM | POA: Diagnosis not present

## 2020-01-09 DIAGNOSIS — K3189 Other diseases of stomach and duodenum: Secondary | ICD-10-CM | POA: Diagnosis not present

## 2020-01-09 DIAGNOSIS — L8990 Pressure ulcer of unspecified site, unspecified stage: Secondary | ICD-10-CM | POA: Insufficient documentation

## 2020-01-09 DIAGNOSIS — Z8509 Personal history of malignant neoplasm of other digestive organs: Secondary | ICD-10-CM | POA: Diagnosis not present

## 2020-01-09 LAB — COMPREHENSIVE METABOLIC PANEL
ALT: 140 U/L — ABNORMAL HIGH (ref 0–44)
AST: 405 U/L — ABNORMAL HIGH (ref 15–41)
Albumin: 3.2 g/dL — ABNORMAL LOW (ref 3.5–5.0)
Alkaline Phosphatase: 145 U/L — ABNORMAL HIGH (ref 38–126)
Anion gap: 11 (ref 5–15)
BUN: 14 mg/dL (ref 8–23)
CO2: 23 mmol/L (ref 22–32)
Calcium: 9.6 mg/dL (ref 8.9–10.3)
Chloride: 107 mmol/L (ref 98–111)
Creatinine, Ser: 1.17 mg/dL — ABNORMAL HIGH (ref 0.44–1.00)
GFR calc Af Amer: 48 mL/min — ABNORMAL LOW (ref >60)
GFR calc non Af Amer: 41 mL/min — ABNORMAL LOW (ref >60)
Glucose, Bld: 153 mg/dL — ABNORMAL HIGH (ref 70–99)
Potassium: 4 mmol/L (ref 3.5–5.1)
Sodium: 141 mmol/L (ref 135–145)
Total Bilirubin: 1.3 mg/dL — ABNORMAL HIGH (ref 0.3–1.2)
Total Protein: 6.3 g/dL — ABNORMAL LOW (ref 6.5–8.1)

## 2020-01-09 LAB — RESPIRATORY PANEL BY RT PCR (FLU A&B, COVID)
Influenza A by PCR: NEGATIVE
Influenza B by PCR: NEGATIVE
SARS Coronavirus 2 by RT PCR: NEGATIVE

## 2020-01-09 LAB — GLUCOSE, CAPILLARY
Glucose-Capillary: 105 mg/dL — ABNORMAL HIGH (ref 70–99)
Glucose-Capillary: 91 mg/dL (ref 70–99)

## 2020-01-09 LAB — PROTIME-INR
INR: 1.2 (ref 0.8–1.2)
Prothrombin Time: 14.7 seconds (ref 11.4–15.2)

## 2020-01-09 LAB — CBC
HCT: 30.8 % — ABNORMAL LOW (ref 36.0–46.0)
Hemoglobin: 9.5 g/dL — ABNORMAL LOW (ref 12.0–15.0)
MCH: 29.1 pg (ref 26.0–34.0)
MCHC: 30.8 g/dL (ref 30.0–36.0)
MCV: 94.5 fL (ref 80.0–100.0)
Platelets: 216 10*3/uL (ref 150–400)
RBC: 3.26 MIL/uL — ABNORMAL LOW (ref 3.87–5.11)
RDW: 18 % — ABNORMAL HIGH (ref 11.5–15.5)
WBC: 8.4 10*3/uL (ref 4.0–10.5)
nRBC: 0 % (ref 0.0–0.2)

## 2020-01-09 LAB — LIPASE, BLOOD: Lipase: 2582 U/L — ABNORMAL HIGH (ref 11–51)

## 2020-01-09 MED ORDER — ACETAMINOPHEN 650 MG RE SUPP: 650.0000 mg | Freq: Four times a day (QID) | RECTAL | Status: DC | PRN

## 2020-01-09 MED ORDER — OXYMETAZOLINE HCL 0.05 % NA SOLN
1.0000 | Freq: Two times a day (BID) | NASAL | Status: DC | PRN
  Filled 2020-01-09: qty 30

## 2020-01-09 MED ORDER — SODIUM CHLORIDE 0.9 % IV BOLUS
1000.0000 mL | Freq: Once | INTRAVENOUS | Status: AC
  Administered 2020-01-09: 03:00:00 1000 mL via INTRAVENOUS

## 2020-01-09 MED ORDER — ACETAMINOPHEN 325 MG PO TABS: 650.0000 mg | ORAL_TABLET | Freq: Four times a day (QID) | ORAL | Status: DC | PRN

## 2020-01-09 MED ORDER — SODIUM CHLORIDE 0.9 % IV SOLN
1000.0000 mL | INTRAVENOUS | Status: DC
  Administered 2020-01-10: 1000 mL via INTRAVENOUS

## 2020-01-09 MED ORDER — SODIUM CHLORIDE 0.9 % IV BOLUS (SEPSIS)
1000.0000 mL | Freq: Once | INTRAVENOUS | Status: AC
  Administered 2020-01-09: 04:00:00 1000 mL via INTRAVENOUS

## 2020-01-09 MED ORDER — LEVOTHYROXINE SODIUM 100 MCG/5ML IV SOLN
25.0000 ug | Freq: Every day | INTRAVENOUS | Status: DC
  Administered 2020-01-09 – 2020-01-11 (×3): 25 ug via INTRAVENOUS
  Filled 2020-01-09 (×4): qty 5

## 2020-01-09 MED ORDER — SODIUM CHLORIDE 0.9% FLUSH
3.0000 mL | Freq: Once | INTRAVENOUS | Status: AC
  Administered 2020-01-09: 02:00:00 3 mL via INTRAVENOUS

## 2020-01-09 MED ORDER — LACTATED RINGERS IV SOLN: INTRAVENOUS | Status: AC

## 2020-01-09 MED ORDER — SODIUM CHLORIDE 0.9 % IV SOLN
1.0000 g | Freq: Once | INTRAVENOUS | Status: AC
  Administered 2020-01-09: 05:00:00 1 g via INTRAVENOUS
  Filled 2020-01-09: qty 10

## 2020-01-09 MED ORDER — MORPHINE SULFATE (PF) 4 MG/ML IV SOLN
4.0000 mg | Freq: Once | INTRAVENOUS | Status: AC
  Administered 2020-01-09: 03:00:00 4 mg via INTRAVENOUS
  Filled 2020-01-09: qty 1

## 2020-01-09 MED ORDER — LIDOCAINE VISCOUS HCL 2 % MT SOLN
15.0000 mL | Freq: Once | OROMUCOSAL | Status: AC
  Administered 2020-01-09: 03:00:00 15 mL via ORAL
  Filled 2020-01-09: qty 15

## 2020-01-09 MED ORDER — ONDANSETRON HCL 4 MG/2ML IJ SOLN
4.0000 mg | Freq: Four times a day (QID) | INTRAMUSCULAR | Status: DC | PRN
  Filled 2020-01-09: qty 2

## 2020-01-09 MED ORDER — IOHEXOL 300 MG/ML  SOLN
100.0000 mL | Freq: Once | INTRAMUSCULAR | Status: AC | PRN
  Administered 2020-01-09: 03:00:00 100 mL via INTRAVENOUS

## 2020-01-09 MED ORDER — METRONIDAZOLE IN NACL 5-0.79 MG/ML-% IV SOLN
500.0000 mg | Freq: Once | INTRAVENOUS | Status: AC
  Administered 2020-01-09: 05:00:00 500 mg via INTRAVENOUS
  Filled 2020-01-09: qty 100

## 2020-01-09 MED ORDER — MORPHINE SULFATE (PF) 2 MG/ML IV SOLN
1.0000 mg | INTRAVENOUS | Status: DC | PRN
  Administered 2020-01-09 – 2020-01-10 (×2): 1 mg via INTRAVENOUS
  Filled 2020-01-09 (×2): qty 1

## 2020-01-09 MED ORDER — LABETALOL HCL 5 MG/ML IV SOLN: 10.0000 mg | INTRAVENOUS | Status: DC | PRN

## 2020-01-09 MED ORDER — IPRATROPIUM-ALBUTEROL 0.5-2.5 (3) MG/3ML IN SOLN
3.0000 mL | RESPIRATORY_TRACT | Status: DC | PRN
  Administered 2020-01-09 – 2020-01-16 (×8): 3 mL via RESPIRATORY_TRACT
  Filled 2020-01-09 (×9): qty 3

## 2020-01-09 MED ORDER — ALUM & MAG HYDROXIDE-SIMETH 200-200-20 MG/5ML PO SUSP
30.0000 mL | Freq: Once | ORAL | Status: AC
  Administered 2020-01-09: 03:00:00 30 mL via ORAL
  Filled 2020-01-09: qty 30

## 2020-01-09 MED ORDER — METRONIDAZOLE IN NACL 5-0.79 MG/ML-% IV SOLN
500.0000 mg | Freq: Three times a day (TID) | INTRAVENOUS | Status: DC
  Administered 2020-01-09 – 2020-01-13 (×12): 500 mg via INTRAVENOUS
  Filled 2020-01-09 (×13): qty 100

## 2020-01-09 MED ORDER — HYDROMORPHONE HCL 1 MG/ML IJ SOLN
1.0000 mg | Freq: Once | INTRAMUSCULAR | Status: AC
  Administered 2020-01-09: 04:00:00 1 mg via INTRAVENOUS
  Filled 2020-01-09: qty 1

## 2020-01-09 MED ORDER — ONDANSETRON HCL 4 MG/2ML IJ SOLN
4.0000 mg | Freq: Once | INTRAMUSCULAR | Status: AC
  Administered 2020-01-09: 03:00:00 4 mg via INTRAVENOUS
  Filled 2020-01-09: qty 2

## 2020-01-09 MED ORDER — CIPROFLOXACIN IN D5W 400 MG/200ML IV SOLN
400.0000 mg | Freq: Two times a day (BID) | INTRAVENOUS | Status: DC
  Administered 2020-01-09 – 2020-01-10 (×2): 400 mg via INTRAVENOUS
  Filled 2020-01-09 (×3): qty 200

## 2020-01-09 MED ORDER — CIPROFLOXACIN IN D5W 400 MG/200ML IV SOLN
400.0000 mg | Freq: Once | INTRAVENOUS | Status: AC
  Administered 2020-01-09: 07:00:00 400 mg via INTRAVENOUS
  Filled 2020-01-09: qty 200

## 2020-01-09 MED ORDER — ONDANSETRON HCL 4 MG PO TABS: 4.0000 mg | ORAL_TABLET | Freq: Four times a day (QID) | ORAL | Status: DC | PRN

## 2020-01-09 NOTE — H&P (View-Only) (Signed)
Referring Provider: Dr. Hal Hope Primary Care Physician:  [name], MD Primary Gastroenterologist:  Dr. Oletta Lamas  Reason for Consultation:  Gallstone pancreatitis  HPI: Monique Lin is a 86 y.o. female admitted for worsening epigastric abdominal pain that radiates to her back without fevers/chills/nausea/vomiting. She has a history of Gastrointestinal stromal tumor (GIST) resected from her stomach in 2011 and CT scan on admit shows a necrotic gastric tumor as well as acute pancreatitis and a distal CBD stone. Lipase 2,582; TB 1.3, ALP 145, AST 405, ALT 140. She is oriented to person, place, and time.  Past Medical History:  Diagnosis Date  . Anemia   . Bronchitis   . Hypertension   . Hypothyroidism   . Leg pain   . Stomach tumor (benign)     Past Surgical History:  Procedure Laterality Date  . IR CATHETER TUBE CHANGE  04/14/2018  . IR THORACENTESIS ASP PLEURAL SPACE W/IMG GUIDE  04/05/2018  . NASAL SINUS SURGERY    . stomach tumor removal      Prior to Admission medications   Medication Sig Start Date End Date Taking? Authorizing Provider  acetaminophen (TYLENOL) 500 MG tablet Take 1 tablet (500 mg total) by mouth every 4 (four) hours as needed (pain). 12/22/19  Yes [name], MD  albuterol (PROVENTIL) (2.5 MG/3ML) 0.083% nebulizer solution Take 2.5 mg by nebulization every 6 (six) hours as needed for wheezing.   Yes [provider]  albuterol (VENTOLIN HFA) 108 (90 Base) MCG/ACT inhaler Inhale 1 puff into the lungs every 6 (six) hours as needed for wheezing or shortness of breath.   Yes [provider]  alum & mag hydroxide-simeth (MAALOX/MYLANTA) 200-200-20 MG/5ML suspension Take 30 mLs by mouth every 6 (six) hours as needed for indigestion, heartburn or flatulence. 12/22/19  Yes [name], MD  amLODipine (NORVASC) 5 MG tablet Take 5 mg by mouth daily.   Yes [provider]  atorvastatin (LIPITOR) 20 MG tablet Take 20 mg by mouth daily.   Yes  [provider]  bisacodyl (DULCOLAX) 5 MG EC tablet Take 1 tablet (5 mg total) by mouth daily as needed for moderate constipation. 12/22/19  Yes [name], MD  carvedilol (COREG) 6.25 MG tablet Take 1 tablet (6.25 mg total) by mouth 2 (two) times daily with a meal. 12/22/19  Yes [name], MD  Cholecalciferol (VITAMIN D-3) 125 MCG (5000 UT) TABS Take 5,000 Units by mouth daily.    Yes [provider]  Dextromethorphan-guaiFENesin (CORICIDIN HBP CONGESTION/COUGH PO) Take 2 tablets by mouth every 12 (twelve) hours as needed (congestion).   Yes [provider]  diphenhydrAMINE (BENADRYL) 25 MG tablet Take 25 mg by mouth every 6 (six) hours as needed for itching.   Yes [provider]  ferrous sulfate 325 (65 FE) MG tablet Take 1 tablet (325 mg total) by mouth 2 (two) times daily with a meal. 12/22/19  Yes [name], MD  fluticasone (FLONASE) 50 MCG/ACT nasal spray Place 2 sprays into both nostrils daily as needed for allergies or rhinitis.    Yes [provider]  folic acid (FOLVITE) 697 MCG tablet Take 800 mcg by mouth daily.    Yes [provider]  levothyroxine (SYNTHROID, LEVOTHROID) 50 MCG tablet Take 50 mcg by mouth daily.   Yes [provider]  pantoprazole (PROTONIX) 40 MG tablet Take 1 tablet (40 mg total) by mouth 2 (two) times daily. 12/22/19  Yes [name], MD  trolamine salicylate (ASPERCREME) 10 % cream  Apply 1 application topically as needed for muscle pain.   Yes [provider]  vitamin E 180 MG (400 UNITS) capsule Take 400 Units by mouth daily.   Yes [provider]  Cyanocobalamin (VITAMIN B-12 PO) Take 500 mcg by mouth daily.     [provider]  doxycycline (VIBRA-TABS) 100 MG tablet Take 1 tablet (100 mg total) by mouth every 12 (twelve) hours. Patient not taking: Reported on 01/09/2020 12/22/19   [name], MD    Scheduled Meds: . levothyroxine  25 mcg Intravenous Daily    Continuous Infusions: . sodium chloride    . ciprofloxacin    . lactated ringers 125 mL/hr at 01/09/20 0624  . metronidazole     PRN Meds:.acetaminophen **OR** acetaminophen, labetalol, morphine injection, ondansetron **OR** ondansetron (ZOFRAN) IV  Allergies as of 01/09/2020 - Review Complete 01/09/2020  Allergen Reaction Noted  . Codeine Other (See Comments) 10/14/2012  . Penicillins Other (See Comments) 04/01/2018    Family History  Family history unknown: Yes    Social History   Socioeconomic History  . Marital status: Widowed    Spouse name: Not on file  . Number of children: Not on file  . Years of education: Not on file  . Highest education level: Not on file  Occupational History  . Not on file  Tobacco Use  . Smoking status: Never Smoker  . Smokeless tobacco: Never Used  Substance and Sexual Activity  . Alcohol use: No  . Drug use: No  . Sexual activity: Not Currently  Other Topics Concern  . Not on file  Social History Narrative  . Not on file   Social Determinants of Health   Financial Resource Strain:   . Difficulty of Paying Living Expenses: Not on file  Food Insecurity:   . Worried About Charity fundraiser in the Last Year: Not on file  . Ran Out of Food in the Last Year: Not on file  Transportation Needs:   . Lack of Transportation (Medical): Not on file  . Lack of Transportation (Non-Medical): Not on file  Physical Activity:   . Days of Exercise per Week: Not on file  . Minutes of Exercise per Session: Not on file  Stress:   . Feeling of Stress : Not on file  Social Connections:   . Frequency of Communication with Friends and Family: Not on file  . Frequency of Social Gatherings with Friends and Family: Not on file  . Attends Religious Services: Not on file  . Active Member of Clubs or Organizations: Not on file  . Attends Archivist Meetings: Not on file  . Marital Status: Not on file  Intimate Partner Violence:   . Fear  of Current or Ex-Partner: Not on file  . Emotionally Abused: Not on file  . Physically Abused: Not on file  . Sexually Abused: Not on file    Review of Systems: All negative except as stated above in HPI.  Physical Exam: Vital signs: Vitals:   01/09/20 0845 01/09/20 0943  BP: (!) 113/48 139/76  Pulse: 72 85  Resp: 14 16  Temp:  98.5 F (36.9 C)  SpO2: 93%      General:  Lethargic, elderly, obese, pleasant, no acute distress Head: normocephalic, atraumatic Eyes: anicteric sclera ENT: oropharynx clear Neck: supple, nontender Lungs:  Clear throughout to auscultation.   No wheezes, crackles, or rhonchi. No acute distress. Heart:  Regular rate and rhythm; no murmurs, clicks, rubs,  or  gallops. Abdomen: upper quadrant tenderness (greatest in epigastric region) with guarding, soft, nondistended, +BS  Rectal:  Deferred Ext: no edema  GI:  Lab Results: Recent Labs    01/09/20 0131  WBC 8.4  HGB 9.5*  HCT 30.8*  PLT 216   BMET Recent Labs    01/09/20 0131  NA 141  K 4.0  CL 107  CO2 23  GLUCOSE 153*  BUN 14  CREATININE 1.17*  CALCIUM 9.6   LFT Recent Labs    01/09/20 0131  PROT 6.3*  ALBUMIN 3.2*  AST 405*  ALT 140*  ALKPHOS 145*  BILITOT 1.3*   PT/INR No results for input(s): LABPROT, INR in the last 72 hours.   Studies/Results: CT ABDOMEN PELVIS W CONTRAST  Result Date: 01/09/2020 CLINICAL DATA:  Abdominal pain EXAM: CT ABDOMEN AND PELVIS WITH CONTRAST TECHNIQUE: Multidetector CT imaging of the abdomen and pelvis was performed using the standard protocol following bolus administration of intravenous contrast. CONTRAST:  175mL OMNIPAQUE IOHEXOL 300 MG/ML  SOLN COMPARISON:  09/20/2010 FINDINGS: Lower chest: Chronic consolidation in the lower lobes is noted left greater than right with changes of bronchiectasis and mucous plugging. Hepatobiliary: Gallbladder is well distended. Multiple gallstones are seen. Biliary ductal dilatation is noted secondary to a  distal common bile duct stone best seen on image number 31 of series 3 and image number 43 of series 6. Pancreas: Pancreas demonstrates some peripancreatic inflammatory change near the head and uncinate process likely related to the distal common bile duct stone and focal pancreatitis. The body and tail are not well visualized due to a large enhancing mass lesion with central necrosis as described below. Spleen: Spleen appears within normal limits. Adrenals/Urinary Tract: Adrenal glands appear within normal limits. The kidneys demonstrate a normal enhancement pattern. Renal cystic change is noted. Normal excretion of contrast material is seen on delayed images. The bladder is decompressed. Stomach/Bowel: Scattered diverticular change of the colon is noted without evidence of diverticulitis. The colon is within normal limits without obstructive change. The appendix is not well seen and may have been surgically removed. No small bowel obstructive changes are seen. Stomach is decompressed. Along the posterior aspect of the stomach there is a large 11.0 x 10.8 cm peripherally enhancing lesion with central necrosis. This envelops the splenic vein and appears to arise from the posterior aspect of the stomach. Distortion of previously seen surgical clips are noted. The body and tail of the pancreas are enveloped by this lesion is well. Vascular/Lymphatic: Aortic atherosclerosis. No enlarged abdominal or pelvic lymph nodes. Reproductive: Uterus and bilateral adnexa are unremarkable. Other: Small fat containing umbilical hernia is noted. No ascites is seen. Musculoskeletal: Degenerative changes of lumbar spine are noted. IMPRESSION: Large centrally necrotic mass lesion which appears to arise from the posterior aspect of the stomach within envelopment of the splenic vein as well as the body and tail of the pancreas. Patient has a history of prior gastrointestinal stromal tumor removal in 2011 and this likely represents local  recurrence in the posterior stomach wall. Cholelithiasis with evidence of a distal common bile duct stone causing mild biliary ductal dilatation and focal pancreatitis involving the head and uncinate process. Chronic consolidation in the lower lobes bilaterally with mucous plugging and bronchiectasis. Electronically Signed   By: Inez Catalina M.D.   On: 01/09/2020 03:35    Impression/Plan: 86 yo with gallstone pancreatitis and a gastric mass likely related to previous GIST removed in the past. Distal CBD stone noted on CT  and will need an ERCP and EGD during this admission but may also need an EUS to evaluate the gastric tumor. Will give time for pancreatitis to resolve prior to ERCP/EGD. Keep NPO. Continue IV Abx. Follow LFTs. Supportive care. Spoke with daughter, [name] by phone. Will f/u.    LOS: 0 days   [name]  01/09/2020, 11:38 AM  Questions please call [phone]

## 2020-01-09 NOTE — ED Triage Notes (Signed)
Pt states that about an hour ago she had sudden onset of abd pain, that radiates to her back with nausea

## 2020-01-09 NOTE — ED Notes (Signed)
Care endorsed to [place], [place]

## 2020-01-09 NOTE — ED Notes (Signed)
All meds given per MAR. Name/DOB verified with pt 

## 2020-01-09 NOTE — ED Notes (Signed)
Pt taken to and from CT in NAD

## 2020-01-09 NOTE — Consult Note (Signed)
Reason for Consult:  Gallstone pancreatitis/ gastric mass Referring Physician: [name] is an 86 y.o. female.  HPI: This is a 86 year old female s/p laparoscopic wedge resection of a GI stromal tumor in 2011 by Dr. Barry Lin.  She presented with acute onset of upper abdominal pain radiating through into her back.  She has had previous milder episodes.  Nausea but no vomiting.    She was found to have pancreatitis with an apparent gallstone in the distal CBD.  CT scan also showed a large mass extending from the posterior gastric wall showing central necrosis.  This seems to envelope the splenic vein and the distal pancreas.   Patient had a recent admission for apparent cardiac arrest requiring brief CPR.  She was anemic and required transfusion.   Past Medical History:  Diagnosis Date  . Anemia   . Bronchitis   . Hypertension   . Hypothyroidism   . Leg pain   . Stomach tumor (benign)     Past Surgical History:  Procedure Laterality Date  . IR CATHETER TUBE CHANGE  04/14/2018  . IR THORACENTESIS ASP PLEURAL SPACE W/IMG GUIDE  04/05/2018  . NASAL SINUS SURGERY    . stomach tumor removal      Family History  Family history unknown: Yes    Social History:  reports that she has never smoked. She has never used smokeless tobacco. She reports that she does not drink alcohol or use drugs.  Allergies:  Allergies  Allergen Reactions  . Codeine Other (See Comments)    Stomach cramps  . Penicillins Other (See Comments)    "Bad stomach cramps" Has patient had a PCN reaction causing immediate rash, facial/tongue/throat swelling, SOB or lightheadedness with hypotension: Unk Has patient had a PCN reaction causing severe rash involving mucus membranes or skin necrosis: Unk Has patient had a PCN reaction that required hospitalization: Unk Has patient had a PCN reaction occurring within the last 10 years: No If all of the above answers are "NO", then may proceed with Cephalosporin  use.      Medications:  Prior to Admission medications   Medication Sig Start Date End Date Taking? Authorizing Provider  acetaminophen (TYLENOL) 500 MG tablet Take 1 tablet (500 mg total) by mouth every 4 (four) hours as needed (pain). 12/22/19  Yes Monique Forward, MD  albuterol (PROVENTIL) (2.5 MG/3ML) 0.083% nebulizer solution Take 2.5 mg by nebulization every 6 (six) hours as needed for wheezing.   Yes [provider]  albuterol (VENTOLIN HFA) 108 (90 Base) MCG/ACT inhaler Inhale 1 puff into the lungs every 6 (six) hours as needed for wheezing or shortness of breath.   Yes [provider]  alum & mag hydroxide-simeth (MAALOX/MYLANTA) 200-200-20 MG/5ML suspension Take 30 mLs by mouth every 6 (six) hours as needed for indigestion, heartburn or flatulence. 12/22/19  Yes Monique Forward, MD  amLODipine (NORVASC) 5 MG tablet Take 5 mg by mouth daily.   Yes [provider]  atorvastatin (LIPITOR) 20 MG tablet Take 20 mg by mouth daily.   Yes [provider]  bisacodyl (DULCOLAX) 5 MG EC tablet Take 1 tablet (5 mg total) by mouth daily as needed for moderate constipation. 12/22/19  Yes Monique Forward, MD  carvedilol (COREG) 6.25 MG tablet Take 1 tablet (6.25 mg total) by mouth 2 (two) times daily with a meal. 12/22/19  Yes Monique Forward, MD  Cholecalciferol (VITAMIN D-3) 125 MCG (5000 UT) TABS Take 5,000 Units by mouth daily.  Yes [provider]  Dextromethorphan-guaiFENesin (CORICIDIN HBP CONGESTION/COUGH PO) Take 2 tablets by mouth every 12 (twelve) hours as needed (congestion).   Yes [provider]  diphenhydrAMINE (BENADRYL) 25 MG tablet Take 25 mg by mouth every 6 (six) hours as needed for itching.   Yes [provider]  ferrous sulfate 325 (65 FE) MG tablet Take 1 tablet (325 mg total) by mouth 2 (two) times daily with a meal. 12/22/19  Yes Monique Forward, MD  fluticasone (FLONASE) 50 MCG/ACT nasal spray Place 2 sprays into both  nostrils daily as needed for allergies or rhinitis.    Yes [provider]  folic acid (FOLVITE) 413 MCG tablet Take 800 mcg by mouth daily.    Yes [provider]  levothyroxine (SYNTHROID, LEVOTHROID) 50 MCG tablet Take 50 mcg by mouth daily.   Yes [provider]  pantoprazole (PROTONIX) 40 MG tablet Take 1 tablet (40 mg total) by mouth 2 (two) times daily. 12/22/19  Yes Monique Forward, MD  trolamine salicylate (ASPERCREME) 10 % cream Apply 1 application topically as needed for muscle pain.   Yes [provider]  vitamin E 180 MG (400 UNITS) capsule Take 400 Units by mouth daily.   Yes [provider]  Cyanocobalamin (VITAMIN B-12 PO) Take 500 mcg by mouth daily.     [provider]  doxycycline (VIBRA-TABS) 100 MG tablet Take 1 tablet (100 mg total) by mouth every 12 (twelve) hours. Patient not taking: Reported on 01/09/2020 12/22/19   Monique Forward, MD     Results for orders placed or performed during the hospital encounter of 01/09/20 (from the past 48 hour(s))  Lipase, blood     Status: Abnormal   Collection Time: 01/09/20  1:31 AM  Result Value Ref Range   Lipase 2,582 (H) 11 - 51 U/L    Comment: RESULTS CONFIRMED BY MANUAL DILUTION Performed at [facility], [address] [place], [place] [address]   Comprehensive metabolic panel     Status: Abnormal   Collection Time: 01/09/20  1:31 AM  Result Value Ref Range   Sodium 141 135 - 145 mmol/L   Potassium 4.0 3.5 - 5.1 mmol/L   Chloride 107 98 - 111 mmol/L   CO2 23 22 - 32 mmol/L   Glucose, Bld 153 (H) 70 - 99 mg/dL   BUN 14 8 - 23 mg/dL   Creatinine, Ser 1.17 (H) 0.44 - 1.00 mg/dL   Calcium 9.6 8.9 - 10.3 mg/dL   Total Protein 6.3 (L) 6.5 - 8.1 g/dL   Albumin 3.2 (L) 3.5 - 5.0 g/dL   AST 405 (H) 15 - 41 U/L   ALT 140 (H) 0 - 44 U/L   Alkaline Phosphatase 145 (H) 38 - 126 U/L   Total Bilirubin 1.3 (H) 0.3 - 1.2 mg/dL   GFR calc non Af Amer 41 (L) >60 mL/min   GFR  calc Af Amer 48 (L) >60 mL/min   Anion gap 11 5 - 15    Comment: Performed at [facility], [place] [address], [place], [place] [address]  CBC     Status: Abnormal   Collection Time: 01/09/20  1:31 AM  Result Value Ref Range   WBC 8.4 4.0 - 10.5 K/uL   RBC 3.26 (L) 3.87 - 5.11 MIL/uL   Hemoglobin 9.5 (L) 12.0 - 15.0 g/dL   HCT 30.8 (L) 36.0 - 46.0 %   MCV 94.5 80.0 - 100.0 fL   MCH  29.1 26.0 - 34.0 pg   MCHC 30.8 30.0 - 36.0 g/dL   RDW 18.0 (H) 11.5 - 15.5 %   Platelets 216 150 - 400 K/uL   nRBC 0.0 0.0 - 0.2 %    Comment: Performed at [facility], [place] [address], [place], [place] [address]  Respiratory Panel by RT PCR (Flu A&B, Covid) - Nasopharyngeal Swab     Status: None   Collection Time: 01/09/20  4:16 AM   Specimen: Nasopharyngeal Swab  Result Value Ref Range   SARS Coronavirus 2 by RT PCR NEGATIVE NEGATIVE    Comment: (NOTE) SARS-CoV-2 target nucleic acids are NOT DETECTED. The SARS-CoV-2 RNA is generally detectable in upper respiratoy specimens during the acute phase of infection. The lowest concentration of SARS-CoV-2 viral copies this assay can detect is 131 copies/mL. A negative result does not preclude SARS-Cov-2 infection and should not be used as the sole basis for treatment or other patient management decisions. A negative result may occur with  improper specimen collection/handling, submission of specimen other than nasopharyngeal swab, presence of viral mutation(s) within the areas targeted by this assay, and inadequate number of viral copies (<131 copies/mL). A negative result must be combined with clinical observations, patient history, and epidemiological information. The expected result is Negative. Fact Sheet for Patients:  PinkCheek.be Fact Sheet for Healthcare Providers:  GravelBags.it This test is not yet ap proved or cleared by the [place] FDA and  has been authorized for  detection and/or diagnosis of SARS-CoV-2 by FDA under an Emergency Use Authorization (EUA). This EUA will remain  in effect (meaning this test can be used) for the duration of the COVID-19 declaration under Section 564(b)(1) of the Act, 21 U.S.C. section 360bbb-3(b)(1), unless the authorization is terminated or revoked sooner.    Influenza A by PCR NEGATIVE NEGATIVE   Influenza B by PCR NEGATIVE NEGATIVE    Comment: (NOTE) The Xpert Xpress SARS-CoV-2/FLU/RSV assay is intended as an aid in  the diagnosis of influenza from Nasopharyngeal swab specimens and  should not be used as a sole basis for treatment. Nasal washings and  aspirates are unacceptable for Xpert Xpress SARS-CoV-2/FLU/RSV  testing. Fact Sheet for Patients: PinkCheek.be Fact Sheet for Healthcare Providers: GravelBags.it This test is not yet approved or cleared by the [place] FDA and  has been authorized for detection and/or diagnosis of SARS-CoV-2 by  FDA under an Emergency Use Authorization (EUA). This EUA will remain  in effect (meaning this test can be used) for the duration of the  Covid-19 declaration under Section 564(b)(1) of the Act, 21  U.S.C. section 360bbb-3(b)(1), unless the authorization is  terminated or revoked. Performed at [facility], [place] [address], [place], [place] [address]     CT ABDOMEN PELVIS W CONTRAST  Result Date: 01/09/2020 CLINICAL DATA:  Abdominal pain EXAM: CT ABDOMEN AND PELVIS WITH CONTRAST TECHNIQUE: Multidetector CT imaging of the abdomen and pelvis was performed using the standard protocol following bolus administration of intravenous contrast. CONTRAST:  126mL OMNIPAQUE IOHEXOL 300 MG/ML  SOLN COMPARISON:  09/20/2010 FINDINGS: Lower chest: Chronic consolidation in the lower lobes is noted left greater than right with changes of bronchiectasis and mucous plugging. Hepatobiliary: Gallbladder is well distended. Multiple  gallstones are seen. Biliary ductal dilatation is noted secondary to a distal common bile duct stone best seen on image number 31 of series 3 and image number 43 of series 6. Pancreas: Pancreas demonstrates some peripancreatic inflammatory change near the head  and uncinate process likely related to the distal common bile duct stone and focal pancreatitis. The body and tail are not well visualized due to a large enhancing mass lesion with central necrosis as described below. Spleen: Spleen appears within normal limits. Adrenals/Urinary Tract: Adrenal glands appear within normal limits. The kidneys demonstrate a normal enhancement pattern. Renal cystic change is noted. Normal excretion of contrast material is seen on delayed images. The bladder is decompressed. Stomach/Bowel: Scattered diverticular change of the colon is noted without evidence of diverticulitis. The colon is within normal limits without obstructive change. The appendix is not well seen and may have been surgically removed. No small bowel obstructive changes are seen. Stomach is decompressed. Along the posterior aspect of the stomach there is a large 11.0 x 10.8 cm peripherally enhancing lesion with central necrosis. This envelops the splenic vein and appears to arise from the posterior aspect of the stomach. Distortion of previously seen surgical clips are noted. The body and tail of the pancreas are enveloped by this lesion is well. Vascular/Lymphatic: Aortic atherosclerosis. No enlarged abdominal or pelvic lymph nodes. Reproductive: Uterus and bilateral adnexa are unremarkable. Other: Small fat containing umbilical hernia is noted. No ascites is seen. Musculoskeletal: Degenerative changes of lumbar spine are noted. IMPRESSION: Large centrally necrotic mass lesion which appears to arise from the posterior aspect of the stomach within envelopment of the splenic vein as well as the body and tail of the pancreas. Patient has a history of prior  gastrointestinal stromal tumor removal in 2011 and this likely represents local recurrence in the posterior stomach wall. Cholelithiasis with evidence of a distal common bile duct stone causing mild biliary ductal dilatation and focal pancreatitis involving the head and uncinate process. Chronic consolidation in the lower lobes bilaterally with mucous plugging and bronchiectasis. Electronically Signed   By: Inez Catalina M.D.   On: 01/09/2020 03:35    Review of Systems  Review of Systems  Constitutional: Negative for fever, chills and unexpected weight change.  HENT: Negative for hearing loss, congestion, sore throat, trouble swallowing and voice change.  Eyes: Negative for visual disturbance.  Respiratory: Negative for cough and wheezing.  Cardiovascular: Negative for chest pain, palpitations and leg swelling.  Gastrointestinal: Positive for nausea, abdominal pain.  Negative for vomiting, diarrhea, constipation, blood in stool, abdominal distention and anal bleeding.  Genitourinary: Negative for hematuria, vaginal bleeding and difficulty urinating.  Musculoskeletal: Negative for arthralgias.  Skin: Negative for rash and wound.  Neurological: Negative for seizures, syncope and headaches.  Hematological: Negative for adenopathy. Does not bruise/bleed easily.  Psychiatric/Behavioral: Negative for confusion.  10-system review otherwise negative.  Blood pressure (!) 113/48, pulse 72, temperature 98.1 F (36.7 C), temperature source Oral, resp. rate 14, height 5' 2.5" (1.588 m), weight 79.4 kg, SpO2 93 %. Physical Exam Constitutional:  WDWN in NAD, conversant, no obvious deformities; lying in bed comfortably Eyes:  Pupils equal, round; sclera anicteric; moist conjunctiva; no lid lag HENT:  Oral mucosa moist; good dentition  Neck:  No masses palpated, trachea midline; no thyromegaly Lungs:  CTA bilaterally; normal respiratory effort CV:  Regular rate and rhythm; no murmurs; extremities  well-perfused with no edema Abd:  +bowel sounds, soft, tender in epigastrium; well-healed incision with small umbilical hernia, no palpable organomegaly Musc:  Unable to assess gait; no apparent clubbing or cyanosis in extremities Lymphatic:  No palpable cervical or axillary lymphadenopathy Skin:  Warm, dry; no sign of jaundice Psychiatric - alert and oriented x 4; calm mood and  affect  Assessment/Plan: 1.  Gallstone pancreatitis with visible distal CBD stone 2.  No radiologic signs of acute cholecystitis 3.  Probable recurrent GI stromal tumor - large tumor with involvement of pancreas and splenic vein.  Recs:  GI consult for EGD/ biopsy and probable ERCP to clear CBD Bowel rest IV hydration Consider Palliative Care to help establish goals of care prior to discussing any possible surgery next week.  We will follow along peripherally over the weekend and will see the patient again on Monday.  Imogene Burn. Georgette Dover, MD, [facility] Surgery  General/ Trauma Surgery   01/09/2020 9:44 AM   Monique Lin 01/09/2020, 9:27 AM

## 2020-01-09 NOTE — ED Notes (Signed)
Miliani Deike daughter [phone] looking for an update on the patient

## 2020-01-09 NOTE — Progress Notes (Signed)
PROGRESS NOTE  Monique Lin HER:[id number] DOB: [birth year]   PCP: [name], MD  Patient is from: Home.  Uses walker and Rollator at baseline.  DOA: 01/09/2020 LOS: 0  Brief Narrative / Interim history: 86 year old female with history of GI stromal tumor s/p resection in 2011, HTN,  Hypothyroidism, anemia and recent "cardiac arrest  ROSC after 1 minute of CPR" presenting with abdominal pain and admitted for acute gallstone pancreatitis and necrotic gastric mass as noted on CT abdomen and pelvis.  Lipase elevated to 2500.  AST 405.  ALT 140.  Total bili 1.3.  The next day, GI and general surgery consulted. Subjective: Reports feeling much better.  She rates her pain 4/10 this morning.  Denies chest pain, dyspnea, nausea, vomiting, diarrhea or UTI symptoms.  Objective: Vitals:   01/09/20 0815 01/09/20 0830 01/09/20 0845 01/09/20 0943  BP: (!) 112/50 (!) 119/56 (!) 113/48 139/76  Pulse: 74 73 72 85  Resp: 13 12 14 16   Temp:    98.5 F (36.9 C)  TempSrc:      SpO2: 94% 93% 93%   Weight:      Height:        Intake/Output Summary (Last 24 hours) at 01/09/2020 1123 Last data filed at 01/09/2020 0909 Gross per 24 hour  Intake 2730 ml  Output --  Net 2730 ml   Filed Weights   01/09/20 0800  Weight: 79.4 kg    Examination:  GENERAL: No acute distress.  Appears well.  HEENT: MMM.  Vision and hearing grossly intact.  NECK: Supple.  No apparent JVD.  RESP:  No IWOB. Good air movement bilaterally. CVS:  RRR. Heart sounds normal.  ABD/GI/GU: Bowel sounds present. Soft. Non tender.  MSK/EXT:  Moves extremities. No apparent deformity. No edema.  SKIN: no apparent skin lesion or wound NEURO: Awake, alert and oriented appropriately.  No apparent focal neuro deficit. PSYCH: Calm. Normal affect.  Procedures:  None  Assessment & Plan: Acute calculus pancreatitis: Noted on CT abdomen and pelvis.  Lipase and liver enzymes elevated.  Pain improved.  She may have passed the stone.   Has no fever or leukocytosis to suggest cholangitis. [facility] consulted. -Continue n.p.o., IV fluid and pain control -Continue Cipro and Flagyl pending GI evaluation.  Necrotic gastric mass in patient with history of GI stromal tumor resected in 2011.  Noted on CT abdomen and pelvis this admission. -GI and general surgery consulted as above.  Elevated liver enzymes/hyperbilirubinemia: Likely due to the above. -Continue monitoring  Essential hypertension: Normotensive -As needed labetalol  Hypothyroidism -Continue IV Synthroid while n.p.o.  AKI: Improved. -Continue IV fluid as above  Normocytic anemia: H&H stable. -Monitor as needed.  History of "cardiac arrest".  -Telemetry monitoring  Debility: Uses walker and Rollator at baseline. -PT/OT eval               DVT prophylaxis: SCD Code Status: Full code Family Communication: Patient and/or RN. Available if any question.  Discharge barrier: Pancreatitis and necrotic GI mass Patient is from: Home Final disposition: Likely home pending clinical improvement  Consultants: General surgery and GI   Microbiology summarized: COVID-19 negative Influenza PCR negative  Sch Meds:  Scheduled Meds: . levothyroxine  25 mcg Intravenous Daily   Continuous Infusions: . sodium chloride    . ciprofloxacin    . lactated ringers 125 mL/hr at 01/09/20 0624  . metronidazole     PRN Meds:.acetaminophen **OR** acetaminophen, labetalol, morphine injection, ondansetron **OR** ondansetron (ZOFRAN) IV  Antimicrobials: Anti-infectives (From admission, onward)   Start     Dose/Rate Route Frequency Ordered Stop   01/09/20 1830  ciprofloxacin (CIPRO) IVPB 400 mg     400 mg 200 mL/hr over 60 Minutes Intravenous Every 12 hours 01/09/20 0853     01/09/20 1400  metroNIDAZOLE (FLAGYL) IVPB 500 mg     500 mg 100 mL/hr over 60 Minutes Intravenous Every 8 hours 01/09/20 0611     01/09/20 0630  ciprofloxacin (CIPRO) IVPB 400 mg     400  mg 200 mL/hr over 60 Minutes Intravenous  Once 01/09/20 0616 01/09/20 0909   01/09/20 0445  cefTRIAXone (ROCEPHIN) 1 g in sodium chloride 0.9 % 100 mL IVPB     1 g 200 mL/hr over 30 Minutes Intravenous  Once 01/09/20 0432 01/09/20 0516   01/09/20 0445  metroNIDAZOLE (FLAGYL) IVPB 500 mg     500 mg 100 mL/hr over 60 Minutes Intravenous  Once 01/09/20 0432 01/09/20 6256       I have personally reviewed the following labs and images: CBC: Recent Labs  Lab 01/09/20 0131  WBC 8.4  HGB 9.5*  HCT 30.8*  MCV 94.5  PLT 216   BMP &GFR Recent Labs  Lab 01/09/20 0131  NA 141  K 4.0  CL 107  CO2 23  GLUCOSE 153*  BUN 14  CREATININE 1.17*  CALCIUM 9.6   Estimated Creatinine Clearance: 31.5 mL/min (A) (by C-G formula based on SCr of 1.17 mg/dL (H)). Liver & Pancreas: Recent Labs  Lab 01/09/20 0131  AST 405*  ALT 140*  ALKPHOS 145*  BILITOT 1.3*  PROT 6.3*  ALBUMIN 3.2*   Recent Labs  Lab 01/09/20 0131  LIPASE 2,582*   No results for input(s): AMMONIA in the last 168 hours. Diabetic: No results for input(s): HGBA1C in the last 72 hours. No results for input(s): GLUCAP in the last 168 hours. Cardiac Enzymes: No results for input(s): CKTOTAL, CKMB, CKMBINDEX, TROPONINI in the last 168 hours. No results for input(s): PROBNP in the last 8760 hours. Coagulation Profile: No results for input(s): INR, PROTIME in the last 168 hours. Thyroid Function Tests: No results for input(s): TSH, T4TOTAL, FREET4, T3FREE, THYROIDAB in the last 72 hours. Lipid Profile: No results for input(s): CHOL, HDL, LDLCALC, TRIG, CHOLHDL, LDLDIRECT in the last 72 hours. Anemia Panel: No results for input(s): VITAMINB12, FOLATE, FERRITIN, TIBC, IRON, RETICCTPCT in the last 72 hours. Urine analysis:    Component Value Date/Time   COLORURINE YELLOW 12/11/2019 1337   APPEARANCEUR CLEAR 12/11/2019 1337   LABSPEC 1.018 12/11/2019 1337   PHURINE 5.0 12/11/2019 1337   GLUCOSEU NEGATIVE 12/11/2019  1337   HGBUR NEGATIVE 12/11/2019 1337   BILIRUBINUR NEGATIVE 12/11/2019 1337   KETONESUR NEGATIVE 12/11/2019 1337   PROTEINUR 30 (A) 12/11/2019 1337   UROBILINOGEN 0.2 11/12/2010 2324   NITRITE NEGATIVE 12/11/2019 1337   LEUKOCYTESUR NEGATIVE 12/11/2019 1337   Sepsis Labs: Invalid input(s): PROCALCITONIN, [place]  Microbiology: Recent Results (from the past 240 hour(s))  Respiratory Panel by RT PCR (Flu A&B, Covid) - Nasopharyngeal Swab     Status: None   Collection Time: 01/09/20  4:16 AM   Specimen: Nasopharyngeal Swab  Result Value Ref Range Status   SARS Coronavirus 2 by RT PCR NEGATIVE NEGATIVE Final    Comment: (NOTE) SARS-CoV-2 target nucleic acids are NOT DETECTED. The SARS-CoV-2 RNA is generally detectable in upper respiratoy specimens during the acute phase of infection. The lowest concentration of SARS-CoV-2 viral copies this assay  can detect is 131 copies/mL. A negative result does not preclude SARS-Cov-2 infection and should not be used as the sole basis for treatment or other patient management decisions. A negative result may occur with  improper specimen collection/handling, submission of specimen other than nasopharyngeal swab, presence of viral mutation(s) within the areas targeted by this assay, and inadequate number of viral copies (<131 copies/mL). A negative result must be combined with clinical observations, patient history, and epidemiological information. The expected result is Negative. Fact Sheet for Patients:  PinkCheek.be Fact Sheet for Healthcare Providers:  GravelBags.it This test is not yet ap proved or cleared by the [place] FDA and  has been authorized for detection and/or diagnosis of SARS-CoV-2 by FDA under an Emergency Use Authorization (EUA). This EUA will remain  in effect (meaning this test can be used) for the duration of the COVID-19 declaration under Section 564(b)(1)  of the Act, 21 U.S.C. section 360bbb-3(b)(1), unless the authorization is terminated or revoked sooner.    Influenza A by PCR NEGATIVE NEGATIVE Final   Influenza B by PCR NEGATIVE NEGATIVE Final    Comment: (NOTE) The Xpert Xpress SARS-CoV-2/FLU/RSV assay is intended as an aid in  the diagnosis of influenza from Nasopharyngeal swab specimens and  should not be used as a sole basis for treatment. Nasal washings and  aspirates are unacceptable for Xpert Xpress SARS-CoV-2/FLU/RSV  testing. Fact Sheet for Patients: PinkCheek.be Fact Sheet for Healthcare Providers: GravelBags.it This test is not yet approved or cleared by the [place] FDA and  has been authorized for detection and/or diagnosis of SARS-CoV-2 by  FDA under an Emergency Use Authorization (EUA). This EUA will remain  in effect (meaning this test can be used) for the duration of the  Covid-19 declaration under Section 564(b)(1) of the Act, 21  U.S.C. section 360bbb-3(b)(1), unless the authorization is  terminated or revoked. Performed at [facility], [place] [address], [place], [place] [address]     Radiology Studies: CT ABDOMEN PELVIS W CONTRAST  Result Date: 01/09/2020 CLINICAL DATA:  Abdominal pain EXAM: CT ABDOMEN AND PELVIS WITH CONTRAST TECHNIQUE: Multidetector CT imaging of the abdomen and pelvis was performed using the standard protocol following bolus administration of intravenous contrast. CONTRAST:  129mL OMNIPAQUE IOHEXOL 300 MG/ML  SOLN COMPARISON:  09/20/2010 FINDINGS: Lower chest: Chronic consolidation in the lower lobes is noted left greater than right with changes of bronchiectasis and mucous plugging. Hepatobiliary: Gallbladder is well distended. Multiple gallstones are seen. Biliary ductal dilatation is noted secondary to a distal common bile duct stone best seen on image number 31 of series 3 and image number 43 of series 6. Pancreas: Pancreas  demonstrates some peripancreatic inflammatory change near the head and uncinate process likely related to the distal common bile duct stone and focal pancreatitis. The body and tail are not well visualized due to a large enhancing mass lesion with central necrosis as described below. Spleen: Spleen appears within normal limits. Adrenals/Urinary Tract: Adrenal glands appear within normal limits. The kidneys demonstrate a normal enhancement pattern. Renal cystic change is noted. Normal excretion of contrast material is seen on delayed images. The bladder is decompressed. Stomach/Bowel: Scattered diverticular change of the colon is noted without evidence of diverticulitis. The colon is within normal limits without obstructive change. The appendix is not well seen and may have been surgically removed. No small bowel obstructive changes are seen. Stomach is decompressed. Along the posterior aspect of the stomach there is a large 11.0 x 10.8  cm peripherally enhancing lesion with central necrosis. This envelops the splenic vein and appears to arise from the posterior aspect of the stomach. Distortion of previously seen surgical clips are noted. The body and tail of the pancreas are enveloped by this lesion is well. Vascular/Lymphatic: Aortic atherosclerosis. No enlarged abdominal or pelvic lymph nodes. Reproductive: Uterus and bilateral adnexa are unremarkable. Other: Small fat containing umbilical hernia is noted. No ascites is seen. Musculoskeletal: Degenerative changes of lumbar spine are noted. IMPRESSION: Large centrally necrotic mass lesion which appears to arise from the posterior aspect of the stomach within envelopment of the splenic vein as well as the body and tail of the pancreas. Patient has a history of prior gastrointestinal stromal tumor removal in 2011 and this likely represents local recurrence in the posterior stomach wall. Cholelithiasis with evidence of a distal common bile duct stone causing mild  biliary ductal dilatation and focal pancreatitis involving the head and uncinate process. Chronic consolidation in the lower lobes bilaterally with mucous plugging and bronchiectasis. Electronically Signed   By: Inez Catalina M.D.   On: 01/09/2020 03:35      Thelton Graca T. Howard  If 7PM-7AM, please contact night-coverage [email or web address] Password [facility] 01/09/2020, 11:23 AM

## 2020-01-09 NOTE — ED Provider Notes (Addendum)
[facility]ENCY DEPARTMENT Provider Note  CSN: [id number] Arrival date & time: 01/09/20 0111  Chief Complaint(s) Abdominal Pain  HPI Monique Lin is a 86 y.o. female who presents to the emergency department with sudden onset left-sided abdominal deep aching severe pain.  No alleviating factors.  Patient reports that it began after taking medicine and eating.  Patient reports prior history of similar pain but not as intense.  She reports pain radiates to her back.  Is endorsing associated nausea without emesis.  No urinary symptoms or diarrhea.  No fevers or chills.  No cough or congestion.  HPI  Past Medical History Past Medical History:  Diagnosis Date  . Anemia   . Bronchitis   . Hypertension   . Hypothyroidism   . Leg pain   . Stomach tumor (benign)    Patient Active Problem List   Diagnosis Date Noted  . Acute GI bleeding 12/11/2019  . CAP (community acquired pneumonia)   . Empyema ([place])   . Pleural effusion, left   . Left lower lobe pneumonia 04/01/2018  . Bronchitis    Home Medication(s) Prior to Admission medications   Medication Sig Start Date End Date Taking? Authorizing Provider  acetaminophen (TYLENOL) 500 MG tablet Take 1 tablet (500 mg total) by mouth every 4 (four) hours as needed (pain). 12/22/19   [name], MD  albuterol (PROVENTIL) (2.5 MG/3ML) 0.083% nebulizer solution Take 2.5 mg by nebulization every 6 (six) hours as needed for wheezing.    [provider]  albuterol (VENTOLIN HFA) 108 (90 Base) MCG/ACT inhaler Inhale 1 puff into the lungs every 6 (six) hours as needed for wheezing or shortness of breath.    [provider]  alum & mag hydroxide-simeth (MAALOX/MYLANTA) 200-200-20 MG/5ML suspension Take 30 mLs by mouth every 6 (six) hours as needed for indigestion, heartburn or flatulence. 12/22/19   [name], MD  amLODipine (NORVASC) 5 MG tablet Take 5 mg by mouth daily.    [provider]    atorvastatin (LIPITOR) 20 MG tablet Take 20 mg by mouth daily.    [provider]  bisacodyl (DULCOLAX) 5 MG EC tablet Take 1 tablet (5 mg total) by mouth daily as needed for moderate constipation. 12/22/19   [name], MD  carvedilol (COREG) 6.25 MG tablet Take 1 tablet (6.25 mg total) by mouth 2 (two) times daily with a meal. 12/22/19   [name], MD  Cholecalciferol (VITAMIN D-3 PO) Take 1 capsule by mouth daily.    [provider]  Cyanocobalamin (VITAMIN B-12 PO) Take 500 mcg by mouth daily.     [provider]  doxycycline (VIBRA-TABS) 100 MG tablet Take 1 tablet (100 mg total) by mouth every 12 (twelve) hours. 12/22/19   [name], MD  ferrous sulfate 325 (65 FE) MG tablet Take 1 tablet (325 mg total) by mouth 2 (two) times daily with a meal. 12/22/19   [name], MD  fluticasone (FLONASE) 50 MCG/ACT nasal spray Place 2 sprays into the nose daily as needed for allergies or rhinitis.     [provider]  FOLIC ACID PO Take 1 tablet by mouth daily.     [provider]  levothyroxine (SYNTHROID, LEVOTHROID) 50 MCG tablet Take 50 mcg by mouth daily.    [provider]  pantoprazole (PROTONIX) 40 MG tablet Take 1 tablet (40 mg total) by mouth 2 (two) times daily. 12/22/19   [name], MD  Past Surgical History Past Surgical History:  Procedure Laterality Date  . IR CATHETER TUBE CHANGE  04/14/2018  . IR THORACENTESIS ASP PLEURAL SPACE W/IMG GUIDE  04/05/2018  . NASAL SINUS SURGERY    . stomach tumor removal     Family History No family history on file.  Social History Social History   Tobacco Use  . Smoking status: Never Smoker  . Smokeless tobacco: Never Used  Substance Use Topics  . Alcohol use: No  . Drug use: No   Allergies Codeine and Penicillins  Review of Systems Review  of Systems All other systems are reviewed and are negative for acute change except as noted in the HPI  Physical Exam Vital Signs  I have reviewed the triage vital signs BP (!) 143/59 (BP Location: Right Arm)   Pulse 78   Temp 98.1 F (36.7 C) (Oral)   Resp 18   SpO2 100%   Physical Exam Vitals reviewed.  Constitutional:      General: She is not in acute distress.    Appearance: She is well-developed. She is not diaphoretic.  HENT:     Head: Normocephalic and atraumatic.     Right Ear: External ear normal.     Left Ear: External ear normal.     Nose: Nose normal.  Eyes:     General: No scleral icterus.    Conjunctiva/sclera: Conjunctivae normal.  Neck:     Trachea: Phonation normal.  Cardiovascular:     Rate and Rhythm: Normal rate and regular rhythm.  Pulmonary:     Effort: Pulmonary effort is normal. No respiratory distress.     Breath sounds: No stridor.  Abdominal:     General: There is no distension.     Tenderness: There is abdominal tenderness in the periumbilical area and left lower quadrant. There is guarding. There is no rebound. Negative signs include Murphy's sign and Rovsing's sign.  Musculoskeletal:        General: Normal range of motion.     Cervical back: Normal range of motion.  Neurological:     Mental Status: She is alert and oriented to person, place, and time.  Psychiatric:        Behavior: Behavior normal.     ED Results and Treatments Labs (all labs ordered are listed, but only abnormal results are displayed) Labs Reviewed  LIPASE, BLOOD - Abnormal; Notable for the following components:      Result Value   Lipase 2,582 (*)    All other components within normal limits  COMPREHENSIVE METABOLIC PANEL - Abnormal; Notable for the following components:   Glucose, Bld 153 (*)    Creatinine, Ser 1.17 (*)    Total Protein 6.3 (*)    Albumin 3.2 (*)    AST 405 (*)    ALT 140 (*)    Alkaline Phosphatase 145 (*)    Total Bilirubin 1.3 (*)     GFR calc non Af Amer 41 (*)    GFR calc Af Amer 48 (*)    All other components within normal limits  CBC - Abnormal; Notable for the following components:   RBC 3.26 (*)    Hemoglobin 9.5 (*)    HCT 30.8 (*)    RDW 18.0 (*)    All other components within normal limits  RESPIRATORY PANEL BY RT PCR (FLU A&B, COVID)  EKG  EKG Interpretation  Date/Time:  Friday January 09 2020 01:13:21 EST Ventricular Rate:  85 PR Interval:  160 QRS Duration: 76 QT Interval:  340 QTC Calculation: 404 R Axis:   17 Text Interpretation: Normal sinus rhythm Low voltage QRS Cannot rule out Anterior infarct , age undetermined Abnormal ECG No significant change since last tracing Confirmed by Addison Lank [phone]) on 01/09/2020 2:27:11 AM      Radiology CT ABDOMEN PELVIS W CONTRAST  Result Date: 01/09/2020 CLINICAL DATA:  Abdominal pain EXAM: CT ABDOMEN AND PELVIS WITH CONTRAST TECHNIQUE: Multidetector CT imaging of the abdomen and pelvis was performed using the standard protocol following bolus administration of intravenous contrast. CONTRAST:  165mL OMNIPAQUE IOHEXOL 300 MG/ML  SOLN COMPARISON:  09/20/2010 FINDINGS: Lower chest: Chronic consolidation in the lower lobes is noted left greater than right with changes of bronchiectasis and mucous plugging. Hepatobiliary: Gallbladder is well distended. Multiple gallstones are seen. Biliary ductal dilatation is noted secondary to a distal common bile duct stone best seen on image number 31 of series 3 and image number 43 of series 6. Pancreas: Pancreas demonstrates some peripancreatic inflammatory change near the head and uncinate process likely related to the distal common bile duct stone and focal pancreatitis. The body and tail are not well visualized due to a large enhancing mass lesion with central necrosis as described below. Spleen: Spleen appears  within normal limits. Adrenals/Urinary Tract: Adrenal glands appear within normal limits. The kidneys demonstrate a normal enhancement pattern. Renal cystic change is noted. Normal excretion of contrast material is seen on delayed images. The bladder is decompressed. Stomach/Bowel: Scattered diverticular change of the colon is noted without evidence of diverticulitis. The colon is within normal limits without obstructive change. The appendix is not well seen and may have been surgically removed. No small bowel obstructive changes are seen. Stomach is decompressed. Along the posterior aspect of the stomach there is a large 11.0 x 10.8 cm peripherally enhancing lesion with central necrosis. This envelops the splenic vein and appears to arise from the posterior aspect of the stomach. Distortion of previously seen surgical clips are noted. The body and tail of the pancreas are enveloped by this lesion is well. Vascular/Lymphatic: Aortic atherosclerosis. No enlarged abdominal or pelvic lymph nodes. Reproductive: Uterus and bilateral adnexa are unremarkable. Other: Small fat containing umbilical hernia is noted. No ascites is seen. Musculoskeletal: Degenerative changes of lumbar spine are noted. IMPRESSION: Large centrally necrotic mass lesion which appears to arise from the posterior aspect of the stomach within envelopment of the splenic vein as well as the body and tail of the pancreas. Patient has a history of prior gastrointestinal stromal tumor removal in 2011 and this likely represents local recurrence in the posterior stomach wall. Cholelithiasis with evidence of a distal common bile duct stone causing mild biliary ductal dilatation and focal pancreatitis involving the head and uncinate process. Chronic consolidation in the lower lobes bilaterally with mucous plugging and bronchiectasis. Electronically Signed   By: Inez Catalina M.D.   On: 01/09/2020 03:35    Pertinent labs & imaging results that were available  during my care of the patient were reviewed by me and considered in my medical decision making (see chart for details).  Medications Ordered in ED Medications  sodium chloride 0.9 % bolus 1,000 mL (1,000 mLs Intravenous New Bag/Given 01/09/20 0415)    Followed by  0.9 %  sodium chloride infusion (has no administration in time range)  cefTRIAXone (ROCEPHIN) 1 g in  sodium chloride 0.9 % 100 mL IVPB (has no administration in time range)  metroNIDAZOLE (FLAGYL) IVPB 500 mg (has no administration in time range)  sodium chloride flush (NS) 0.9 % injection 3 mL (3 mLs Intravenous Given 01/09/20 0200)  alum & mag hydroxide-simeth (MAALOX/MYLANTA) 200-200-20 MG/5ML suspension 30 mL (30 mLs Oral Given 01/09/20 0239)    And  lidocaine (XYLOCAINE) 2 % viscous mouth solution 15 mL (15 mLs Oral Given 01/09/20 0239)  ondansetron (ZOFRAN) injection 4 mg (4 mg Intravenous Given 01/09/20 0241)  sodium chloride 0.9 % bolus 1,000 mL (0 mLs Intravenous Stopped 01/09/20 0415)  morphine 4 MG/ML injection 4 mg (4 mg Intravenous Given 01/09/20 0239)  iohexol (OMNIPAQUE) 300 MG/ML solution 100 mL (100 mLs Intravenous Contrast Given 01/09/20 0321)  HYDROmorphone (DILAUDID) injection 1 mg (1 mg Intravenous Given 01/09/20 0414)                                                                                                                                    Procedures Procedures  (including critical care time)  Medical Decision Making / ED Course I have reviewed the nursing notes for this encounter and the patient's prior records (if available in EHR or on provided paperwork).   Monique Lin was evaluated in Emergency Department on 01/09/2020 for the symptoms described in the history of present illness. She was evaluated in the context of the global COVID-19 pandemic, which necessitated consideration that the patient might be at risk for infection with the SARS-CoV-2 virus that causes COVID-19. Institutional protocols and algorithms that  pertain to the evaluation of patients at risk for COVID-19 are in a state of rapid change based on information released by regulatory bodies including the CDC and federal and state organizations. These policies and algorithms were followed during the patient's care in the ED.  Work-up confirmed etiology is from gallstone pancreatitis.  No evidence of acute cholecystitis.  Patient does have a stone in the duct causing the obstruction.  Case discussed with Dr. Hal Hope from hospitalist service who will admit the patient and consult GI in the morning.  Empiric antibiotics started.  IV fluids given.  CT also revealed a recurrent gastric tumor.  Findings and plan was relayed to family.      Final Clinical Impression(s) / ED Diagnoses Final diagnoses:  Acute gallstone pancreatitis  Transaminitis      This chart was dictated using voice recognition software.  Despite best efforts to proofread,  errors can occur which can change the documentation meaning.     Fatima Blank, MD 01/09/20 [id number]

## 2020-01-09 NOTE — Progress Notes (Signed)
Pharmacy Antibiotic Note  Monique Lin is a 86 y.o. female admitted on 01/09/2020 with abdominal pain.  Pharmacy has been consulted for ciprofloxacin dosing. Pt is afebrile and WBC is WNL. Scr is WNL at 1.17.   Plan: Ciprofloxacin 400mg  IV Q12h F/u renal fxn, C&S, clinical status   Height: 5' 2.5" (158.8 cm) Weight: 175 lb (79.4 kg) IBW/kg (Calculated) : 51.25  Temp (24hrs), Avg:98.1 F (36.7 C), Min:98 F (36.7 C), Max:98.1 F (36.7 C)  Recent Labs  Lab 01/09/20 0131  WBC 8.4  CREATININE 1.17*    Estimated Creatinine Clearance: 31.5 mL/min (A) (by C-G formula based on SCr of 1.17 mg/dL (H)).    Allergies  Allergen Reactions  . Codeine Other (See Comments)    Stomach cramps  . Penicillins Other (See Comments)    "Bad stomach cramps" Has patient had a PCN reaction causing immediate rash, facial/tongue/throat swelling, SOB or lightheadedness with hypotension: Unk Has patient had a PCN reaction causing severe rash involving mucus membranes or skin necrosis: Unk Has patient had a PCN reaction that required hospitalization: Unk Has patient had a PCN reaction occurring within the last 10 years: No If all of the above answers are "NO", then may proceed with Cephalosporin use.      Antimicrobials this admission: Cipro 2/5>> Flagyl 2/5>> CTX x 1 2/5  Dose adjustments this admission: N/A  Microbiology results: Pending  Thank you for allowing pharmacy to be a part of this patient's care.  Gill Delrossi, Rande Lawman 01/09/2020 8:54 AM

## 2020-01-09 NOTE — H&P (Signed)
History and Physical    Monique Lin:[id number] DOB: 1927/09/08 DOA: 01/09/2020  PCP: [name], MD  Patient coming from: Home.  Chief Complaint: Abdominal pain.  HPI: Monique Lin is a 86 y.o. female with history of GI stromal tumor status post resection in 2011 with history of hypertension hypothyroidism who was recently admitted for possible cardiac arrest requiring CPR for 1 minute at this time patient was found to be in anemia was transfused and discharged home started experiencing severe epigastric pain since yesterday.  Denies vomiting or diarrhea.  Pain is stabbing in nature radiating to the back.  Has not had any fever chills chest pain or shortness of breath.  ED Course: In the ER on exam patient has tender epigastrium with CT scan showing necrotic gastric tumor and also CBD stone with pancreatitis.  Chronic changes in the lung with bronchiectasis.  Labs showed lipase of 2500 AST 4 5 ALT 140 total bilirubin 1.3 hemoglobin 9.5 EKG shows normal sinus rhythm.  Patient was started on fluids pain relief medication admitted for further management of acute gallstone pancreatitis and will need further work-up for the tumor in the stomach.  Review of Systems: As per HPI, rest all negative.   Past Medical History:  Diagnosis Date  . Anemia   . Bronchitis   . Hypertension   . Hypothyroidism   . Leg pain   . Stomach tumor (benign)     Past Surgical History:  Procedure Laterality Date  . IR CATHETER TUBE CHANGE  04/14/2018  . IR THORACENTESIS ASP PLEURAL SPACE W/IMG GUIDE  04/05/2018  . NASAL SINUS SURGERY    . stomach tumor removal       reports that she has never smoked. She has never used smokeless tobacco. She reports that she does not drink alcohol or use drugs.  Allergies  Allergen Reactions  . Codeine Other (See Comments)    Stomach cramps  . Penicillins Other (See Comments)    "Bad stomach cramps" Has patient had a PCN reaction causing immediate rash,  facial/tongue/throat swelling, SOB or lightheadedness with hypotension: Unk Has patient had a PCN reaction causing severe rash involving mucus membranes or skin necrosis: Unk Has patient had a PCN reaction that required hospitalization: Unk Has patient had a PCN reaction occurring within the last 10 years: No If all of the above answers are "NO", then may proceed with Cephalosporin use.      Family History  Family history unknown: Yes    Prior to Admission medications   Medication Sig Start Date End Date Taking? Authorizing Provider  acetaminophen (TYLENOL) 500 MG tablet Take 1 tablet (500 mg total) by mouth every 4 (four) hours as needed (pain). 12/22/19   [name], MD  albuterol (PROVENTIL) (2.5 MG/3ML) 0.083% nebulizer solution Take 2.5 mg by nebulization every 6 (six) hours as needed for wheezing.    [provider]  albuterol (VENTOLIN HFA) 108 (90 Base) MCG/ACT inhaler Inhale 1 puff into the lungs every 6 (six) hours as needed for wheezing or shortness of breath.    [provider]  alum & mag hydroxide-simeth (MAALOX/MYLANTA) 200-200-20 MG/5ML suspension Take 30 mLs by mouth every 6 (six) hours as needed for indigestion, heartburn or flatulence. 12/22/19   [name], MD  amLODipine (NORVASC) 5 MG tablet Take 5 mg by mouth daily.    [provider]  atorvastatin (LIPITOR) 20 MG tablet Take 20 mg by mouth daily.    [provider]  bisacodyl (DULCOLAX) 5 MG EC tablet Take 1 tablet (5 mg total) by mouth daily as needed for moderate constipation. 12/22/19   [name], MD  carvedilol (COREG) 6.25 MG tablet Take 1 tablet (6.25 mg total) by mouth 2 (two) times daily with a meal. 12/22/19   [name], MD  Cholecalciferol (VITAMIN D-3 PO) Take 1 capsule by mouth daily.    [provider]  Cyanocobalamin (VITAMIN B-12 PO) Take 500 mcg by mouth daily.     [provider]  doxycycline (VIBRA-TABS) 100 MG tablet Take 1 tablet  (100 mg total) by mouth every 12 (twelve) hours. 12/22/19   [name], MD  ferrous sulfate 325 (65 FE) MG tablet Take 1 tablet (325 mg total) by mouth 2 (two) times daily with a meal. 12/22/19   [name], MD  fluticasone (FLONASE) 50 MCG/ACT nasal spray Place 2 sprays into the nose daily as needed for allergies or rhinitis.     [provider]  FOLIC ACID PO Take 1 tablet by mouth daily.     [provider]  levothyroxine (SYNTHROID, LEVOTHROID) 50 MCG tablet Take 50 mcg by mouth daily.    [provider]  pantoprazole (PROTONIX) 40 MG tablet Take 1 tablet (40 mg total) by mouth 2 (two) times daily. 12/22/19   [name], MD    Physical Exam: Constitutional: Moderately built and nourished. Vitals:   01/09/20 0415 01/09/20 0430 01/09/20 0515 01/09/20 0600  BP: (!) 139/117 (!) 155/73 (!) 158/71 (!) 126/57  Pulse: 92 95 100 96  Resp: (!) 21 (!) 23 17 19   Temp:      TempSrc:      SpO2: 97% 95% 94% 94%   Eyes: Anicteric no pallor. ENMT: No discharge from the ears eyes nose or mouth. Neck: No mass or.  No neck rigidity. Respiratory: No rhonchi or crepitations. Cardiovascular: S1-S2 heard. Abdomen: Epigastric tenderness.  No guarding or rigidity. Musculoskeletal: No edema. Skin: No rash. Neurologic: Alert awake oriented time place and person.  Moves all extremities. Psychiatric: Appears normal.   Labs on Admission: I have personally reviewed following labs and imaging studies  CBC: Recent Labs  Lab 01/09/20 0131  WBC 8.4  HGB 9.5*  HCT 30.8*  MCV 94.5  PLT 850   Basic Metabolic Panel: Recent Labs  Lab 01/09/20 0131  NA 141  K 4.0  CL 107  CO2 23  GLUCOSE 153*  BUN 14  CREATININE 1.17*  CALCIUM 9.6   GFR: CrCl cannot be calculated (Unknown ideal weight.). Liver Function Tests: Recent Labs  Lab 01/09/20 0131  AST 405*  ALT 140*  ALKPHOS 145*  BILITOT 1.3*  PROT 6.3*  ALBUMIN 3.2*   Recent Labs  Lab 01/09/20 0131    LIPASE 2,582*   No results for input(s): AMMONIA in the last 168 hours. Coagulation Profile: No results for input(s): INR, PROTIME in the last 168 hours. Cardiac Enzymes: No results for input(s): CKTOTAL, CKMB, CKMBINDEX, TROPONINI in the last 168 hours. BNP (last 3 results) No results for input(s): PROBNP in the last 8760 hours. HbA1C: No results for input(s): HGBA1C in the last 72 hours. CBG: No results for input(s): GLUCAP in the last 168 hours. Lipid Profile: No results for input(s): CHOL, HDL, LDLCALC, TRIG, CHOLHDL, LDLDIRECT in the last 72 hours. Thyroid Function Tests: No results for input(s): TSH, T4TOTAL, FREET4, T3FREE, THYROIDAB in the last 72 hours. Anemia Panel: No results for input(s): VITAMINB12, FOLATE, FERRITIN, TIBC, IRON, RETICCTPCT in the last  72 hours. Urine analysis:    Component Value Date/Time   COLORURINE YELLOW 12/11/2019 1337   APPEARANCEUR CLEAR 12/11/2019 1337   LABSPEC 1.018 12/11/2019 1337   PHURINE 5.0 12/11/2019 1337   GLUCOSEU NEGATIVE 12/11/2019 1337   HGBUR NEGATIVE 12/11/2019 1337   BILIRUBINUR NEGATIVE 12/11/2019 1337   KETONESUR NEGATIVE 12/11/2019 1337   PROTEINUR 30 (A) 12/11/2019 1337   UROBILINOGEN 0.2 11/12/2010 2324   NITRITE NEGATIVE 12/11/2019 1337   LEUKOCYTESUR NEGATIVE 12/11/2019 1337   Sepsis Labs: @LABRCNTIP (procalcitonin:4,lacticidven:4) )No results found for this or any previous visit (from the past 240 hour(s)).   Radiological Exams on Admission: CT ABDOMEN PELVIS W CONTRAST  Result Date: 01/09/2020 CLINICAL DATA:  Abdominal pain EXAM: CT ABDOMEN AND PELVIS WITH CONTRAST TECHNIQUE: Multidetector CT imaging of the abdomen and pelvis was performed using the standard protocol following bolus administration of intravenous contrast. CONTRAST:  155mL OMNIPAQUE IOHEXOL 300 MG/ML  SOLN COMPARISON:  09/20/2010 FINDINGS: Lower chest: Chronic consolidation in the lower lobes is noted left greater than right with changes of  bronchiectasis and mucous plugging. Hepatobiliary: Gallbladder is well distended. Multiple gallstones are seen. Biliary ductal dilatation is noted secondary to a distal common bile duct stone best seen on image number 31 of series 3 and image number 43 of series 6. Pancreas: Pancreas demonstrates some peripancreatic inflammatory change near the head and uncinate process likely related to the distal common bile duct stone and focal pancreatitis. The body and tail are not well visualized due to a large enhancing mass lesion with central necrosis as described below. Spleen: Spleen appears within normal limits. Adrenals/Urinary Tract: Adrenal glands appear within normal limits. The kidneys demonstrate a normal enhancement pattern. Renal cystic change is noted. Normal excretion of contrast material is seen on delayed images. The bladder is decompressed. Stomach/Bowel: Scattered diverticular change of the colon is noted without evidence of diverticulitis. The colon is within normal limits without obstructive change. The appendix is not well seen and may have been surgically removed. No small bowel obstructive changes are seen. Stomach is decompressed. Along the posterior aspect of the stomach there is a large 11.0 x 10.8 cm peripherally enhancing lesion with central necrosis. This envelops the splenic vein and appears to arise from the posterior aspect of the stomach. Distortion of previously seen surgical clips are noted. The body and tail of the pancreas are enveloped by this lesion is well. Vascular/Lymphatic: Aortic atherosclerosis. No enlarged abdominal or pelvic lymph nodes. Reproductive: Uterus and bilateral adnexa are unremarkable. Other: Small fat containing umbilical hernia is noted. No ascites is seen. Musculoskeletal: Degenerative changes of lumbar spine are noted. IMPRESSION: Large centrally necrotic mass lesion which appears to arise from the posterior aspect of the stomach within envelopment of the splenic  vein as well as the body and tail of the pancreas. Patient has a history of prior gastrointestinal stromal tumor removal in 2011 and this likely represents local recurrence in the posterior stomach wall. Cholelithiasis with evidence of a distal common bile duct stone causing mild biliary ductal dilatation and focal pancreatitis involving the head and uncinate process. Chronic consolidation in the lower lobes bilaterally with mucous plugging and bronchiectasis. Electronically Signed   By: Inez Catalina M.D.   On: 01/09/2020 03:35    EKG: Independently reviewed.  Normal sinus rhythm.  Assessment/Plan Principal Problem:   Acute gallstone pancreatitis Active Problems:   Gastric tumor   Acute renal failure (ARF) (HCC)   Essential hypertension   Hypothyroidism    1. Acute  gallstone pancreatitis -we will keep patient n.p.o. on empiric antibiotics IV fluids pain relief medication consult gastroenterology. 2. Necrotic gastric mass with history of GI stromal tumor will need further input from general surgery and gastroenterology.  Presently n.p.o. 3. Recent history of GI bleed requiring transfusion follow CBC closely. 4. Hypertension we will keep patient on as needed IV labetalol. 5. Hypothyroidism we will keep patient on IV Synthroid. 6. Acute renal failure likely from dehydration gently hydrating follow metabolic panel. 7. Recent admission for questionable cardiac arrest. 8. Hyperlipidemia presently n.p.o.  Given the acute gallstone pancreatitis with also patient having necrotic gastric tumor will need close monitoring for any further deterioration and will need inpatient status.  Covid test is pending.   DVT prophylaxis: SCDs. Code Status: Full code. Family Communication: ER physician discussed with the patient's daughter. Disposition Plan: Home. Consults called: None. Admission status: Inpatient.   Rise Patience MD [facility] Pager [phone].  If 7PM-7AM, please  contact night-coverage [email or web address] Password [facility]  01/09/2020, 6:24 AM

## 2020-01-09 NOTE — ED Notes (Signed)
ED Provider at bedside. 

## 2020-01-09 NOTE — ED Notes (Signed)
Meds given per MAR. Name/DOB verified with pt Covid swab collected, labeled with 2 pt identifiers, and brought to lab

## 2020-01-09 NOTE — Consult Note (Addendum)
Referring Provider: Dr. Hal Hope Primary Care Physician:  [name], MD Primary Gastroenterologist:  Dr. Oletta Lamas  Reason for Consultation:  Gallstone pancreatitis  HPI: Monique Lin is a 86 y.o. female admitted for worsening epigastric abdominal pain that radiates to her back without fevers/chills/nausea/vomiting. She has a history of Gastrointestinal stromal tumor (GIST) resected from her stomach in 2011 and CT scan on admit shows a necrotic gastric tumor as well as acute pancreatitis and a distal CBD stone. Lipase 2,582; TB 1.3, ALP 145, AST 405, ALT 140. She is oriented to person, place, and time.  Past Medical History:  Diagnosis Date  . Anemia   . Bronchitis   . Hypertension   . Hypothyroidism   . Leg pain   . Stomach tumor (benign)     Past Surgical History:  Procedure Laterality Date  . IR CATHETER TUBE CHANGE  04/14/2018  . IR THORACENTESIS ASP PLEURAL SPACE W/IMG GUIDE  04/05/2018  . NASAL SINUS SURGERY    . stomach tumor removal      Prior to Admission medications   Medication Sig Start Date End Date Taking? Authorizing Provider  acetaminophen (TYLENOL) 500 MG tablet Take 1 tablet (500 mg total) by mouth every 4 (four) hours as needed (pain). 12/22/19  Yes [name], MD  albuterol (PROVENTIL) (2.5 MG/3ML) 0.083% nebulizer solution Take 2.5 mg by nebulization every 6 (six) hours as needed for wheezing.   Yes [provider]  albuterol (VENTOLIN HFA) 108 (90 Base) MCG/ACT inhaler Inhale 1 puff into the lungs every 6 (six) hours as needed for wheezing or shortness of breath.   Yes [provider]  alum & mag hydroxide-simeth (MAALOX/MYLANTA) 200-200-20 MG/5ML suspension Take 30 mLs by mouth every 6 (six) hours as needed for indigestion, heartburn or flatulence. 12/22/19  Yes [name], MD  amLODipine (NORVASC) 5 MG tablet Take 5 mg by mouth daily.   Yes [provider]  atorvastatin (LIPITOR) 20 MG tablet Take 20 mg by mouth daily.   Yes  [provider]  bisacodyl (DULCOLAX) 5 MG EC tablet Take 1 tablet (5 mg total) by mouth daily as needed for moderate constipation. 12/22/19  Yes [name], MD  carvedilol (COREG) 6.25 MG tablet Take 1 tablet (6.25 mg total) by mouth 2 (two) times daily with a meal. 12/22/19  Yes [name], MD  Cholecalciferol (VITAMIN D-3) 125 MCG (5000 UT) TABS Take 5,000 Units by mouth daily.    Yes [provider]  Dextromethorphan-guaiFENesin (CORICIDIN HBP CONGESTION/COUGH PO) Take 2 tablets by mouth every 12 (twelve) hours as needed (congestion).   Yes [provider]  diphenhydrAMINE (BENADRYL) 25 MG tablet Take 25 mg by mouth every 6 (six) hours as needed for itching.   Yes [provider]  ferrous sulfate 325 (65 FE) MG tablet Take 1 tablet (325 mg total) by mouth 2 (two) times daily with a meal. 12/22/19  Yes [name], MD  fluticasone (FLONASE) 50 MCG/ACT nasal spray Place 2 sprays into both nostrils daily as needed for allergies or rhinitis.    Yes [provider]  folic acid (FOLVITE) 315 MCG tablet Take 800 mcg by mouth daily.    Yes [provider]  levothyroxine (SYNTHROID, LEVOTHROID) 50 MCG tablet Take 50 mcg by mouth daily.   Yes [provider]  pantoprazole (PROTONIX) 40 MG tablet Take 1 tablet (40 mg total) by mouth 2 (two) times daily. 12/22/19  Yes [name], MD  trolamine salicylate (ASPERCREME) 10 % cream  Apply 1 application topically as needed for muscle pain.   Yes [provider]  vitamin E 180 MG (400 UNITS) capsule Take 400 Units by mouth daily.   Yes [provider]  Cyanocobalamin (VITAMIN B-12 PO) Take 500 mcg by mouth daily.     [provider]  doxycycline (VIBRA-TABS) 100 MG tablet Take 1 tablet (100 mg total) by mouth every 12 (twelve) hours. Patient not taking: Reported on 01/09/2020 12/22/19   [name], MD    Scheduled Meds: . levothyroxine  25 mcg Intravenous Daily    Continuous Infusions: . sodium chloride    . ciprofloxacin    . lactated ringers 125 mL/hr at 01/09/20 0624  . metronidazole     PRN Meds:.acetaminophen **OR** acetaminophen, labetalol, morphine injection, ondansetron **OR** ondansetron (ZOFRAN) IV  Allergies as of 01/09/2020 - Review Complete 01/09/2020  Allergen Reaction Noted  . Codeine Other (See Comments) 10/14/2012  . Penicillins Other (See Comments) 04/01/2018    Family History  Family history unknown: Yes    Social History   Socioeconomic History  . Marital status: Widowed    Spouse name: Not on file  . Number of children: Not on file  . Years of education: Not on file  . Highest education level: Not on file  Occupational History  . Not on file  Tobacco Use  . Smoking status: Never Smoker  . Smokeless tobacco: Never Used  Substance and Sexual Activity  . Alcohol use: No  . Drug use: No  . Sexual activity: Not Currently  Other Topics Concern  . Not on file  Social History Narrative  . Not on file   Social Determinants of Health   Financial Resource Strain:   . Difficulty of Paying Living Expenses: Not on file  Food Insecurity:   . Worried About Charity fundraiser in the Last Year: Not on file  . Ran Out of Food in the Last Year: Not on file  Transportation Needs:   . Lack of Transportation (Medical): Not on file  . Lack of Transportation (Non-Medical): Not on file  Physical Activity:   . Days of Exercise per Week: Not on file  . Minutes of Exercise per Session: Not on file  Stress:   . Feeling of Stress : Not on file  Social Connections:   . Frequency of Communication with Friends and Family: Not on file  . Frequency of Social Gatherings with Friends and Family: Not on file  . Attends Religious Services: Not on file  . Active Member of Clubs or Organizations: Not on file  . Attends Archivist Meetings: Not on file  . Marital Status: Not on file  Intimate Partner Violence:   . Fear  of Current or Ex-Partner: Not on file  . Emotionally Abused: Not on file  . Physically Abused: Not on file  . Sexually Abused: Not on file    Review of Systems: All negative except as stated above in HPI.  Physical Exam: Vital signs: Vitals:   01/09/20 0845 01/09/20 0943  BP: (!) 113/48 139/76  Pulse: 72 85  Resp: 14 16  Temp:  98.5 F (36.9 C)  SpO2: 93%      General:  Lethargic, elderly, obese, pleasant, no acute distress Head: normocephalic, atraumatic Eyes: anicteric sclera ENT: oropharynx clear Neck: supple, nontender Lungs:  Clear throughout to auscultation.   No wheezes, crackles, or rhonchi. No acute distress. Heart:  Regular rate and rhythm; no murmurs, clicks, rubs,  or  gallops. Abdomen: upper quadrant tenderness (greatest in epigastric region) with guarding, soft, nondistended, +BS  Rectal:  Deferred Ext: no edema  GI:  Lab Results: Recent Labs    01/09/20 0131  WBC 8.4  HGB 9.5*  HCT 30.8*  PLT 216   BMET Recent Labs    01/09/20 0131  NA 141  K 4.0  CL 107  CO2 23  GLUCOSE 153*  BUN 14  CREATININE 1.17*  CALCIUM 9.6   LFT Recent Labs    01/09/20 0131  PROT 6.3*  ALBUMIN 3.2*  AST 405*  ALT 140*  ALKPHOS 145*  BILITOT 1.3*   PT/INR No results for input(s): LABPROT, INR in the last 72 hours.   Studies/Results: CT ABDOMEN PELVIS W CONTRAST  Result Date: 01/09/2020 CLINICAL DATA:  Abdominal pain EXAM: CT ABDOMEN AND PELVIS WITH CONTRAST TECHNIQUE: Multidetector CT imaging of the abdomen and pelvis was performed using the standard protocol following bolus administration of intravenous contrast. CONTRAST:  170mL OMNIPAQUE IOHEXOL 300 MG/ML  SOLN COMPARISON:  09/20/2010 FINDINGS: Lower chest: Chronic consolidation in the lower lobes is noted left greater than right with changes of bronchiectasis and mucous plugging. Hepatobiliary: Gallbladder is well distended. Multiple gallstones are seen. Biliary ductal dilatation is noted secondary to a  distal common bile duct stone best seen on image number 31 of series 3 and image number 43 of series 6. Pancreas: Pancreas demonstrates some peripancreatic inflammatory change near the head and uncinate process likely related to the distal common bile duct stone and focal pancreatitis. The body and tail are not well visualized due to a large enhancing mass lesion with central necrosis as described below. Spleen: Spleen appears within normal limits. Adrenals/Urinary Tract: Adrenal glands appear within normal limits. The kidneys demonstrate a normal enhancement pattern. Renal cystic change is noted. Normal excretion of contrast material is seen on delayed images. The bladder is decompressed. Stomach/Bowel: Scattered diverticular change of the colon is noted without evidence of diverticulitis. The colon is within normal limits without obstructive change. The appendix is not well seen and may have been surgically removed. No small bowel obstructive changes are seen. Stomach is decompressed. Along the posterior aspect of the stomach there is a large 11.0 x 10.8 cm peripherally enhancing lesion with central necrosis. This envelops the splenic vein and appears to arise from the posterior aspect of the stomach. Distortion of previously seen surgical clips are noted. The body and tail of the pancreas are enveloped by this lesion is well. Vascular/Lymphatic: Aortic atherosclerosis. No enlarged abdominal or pelvic lymph nodes. Reproductive: Uterus and bilateral adnexa are unremarkable. Other: Small fat containing umbilical hernia is noted. No ascites is seen. Musculoskeletal: Degenerative changes of lumbar spine are noted. IMPRESSION: Large centrally necrotic mass lesion which appears to arise from the posterior aspect of the stomach within envelopment of the splenic vein as well as the body and tail of the pancreas. Patient has a history of prior gastrointestinal stromal tumor removal in 2011 and this likely represents local  recurrence in the posterior stomach wall. Cholelithiasis with evidence of a distal common bile duct stone causing mild biliary ductal dilatation and focal pancreatitis involving the head and uncinate process. Chronic consolidation in the lower lobes bilaterally with mucous plugging and bronchiectasis. Electronically Signed   By: Inez Catalina M.D.   On: 01/09/2020 03:35    Impression/Plan: 86 yo with gallstone pancreatitis and a gastric mass likely related to previous GIST removed in the past. Distal CBD stone noted on CT  and will need an ERCP and EGD during this admission but may also need an EUS to evaluate the gastric tumor. Will give time for pancreatitis to resolve prior to ERCP/EGD. Keep NPO. Continue IV Abx. Follow LFTs. Supportive care. Spoke with daughter, [name] by phone. Will f/u.    LOS: 0 days   [name]  01/09/2020, 11:38 AM  Questions please call [phone]

## 2020-01-09 NOTE — ED Notes (Signed)
Pt told this tech she felt like she needed to poop, so she was taken to the restroom. Pt uses a walker and she was furnished with a walker while she was in the restroom. Once pt is finished pt is brought back out to the lobby to wait for a room to see the Dr

## 2020-01-09 NOTE — ED Notes (Signed)
Pt wheeled to ED Rm 28 from [place]. Pt A&OX4, in NAD. Pt breathing non-labored. Placed on continuous monitors. Pt c/o abdominal pain that began around 11pm after drinking a Boost energy drink. Pt reports problems with constipation in the past. Denies V/D. Endorses some nausea. Denies CP/SOB. Pt also endorsing low back pain which she states is chronic. Denies urinary s/s PIV initiated, 20G to [place]. IV flushes with 10 cc NS. Positive blood return. Secured with tape and tegaderm.

## 2020-01-10 LAB — PHOSPHORUS: Phosphorus: 3.8 mg/dL (ref 2.5–4.6)

## 2020-01-10 LAB — CBC
HCT: 29.6 % — ABNORMAL LOW (ref 36.0–46.0)
Hemoglobin: 9.1 g/dL — ABNORMAL LOW (ref 12.0–15.0)
MCH: 29 pg (ref 26.0–34.0)
MCHC: 30.7 g/dL (ref 30.0–36.0)
MCV: 94.3 fL (ref 80.0–100.0)
Platelets: 178 10*3/uL (ref 150–400)
RBC: 3.14 MIL/uL — ABNORMAL LOW (ref 3.87–5.11)
RDW: 18.5 % — ABNORMAL HIGH (ref 11.5–15.5)
WBC: 12.6 10*3/uL — ABNORMAL HIGH (ref 4.0–10.5)
nRBC: 0 % (ref 0.0–0.2)

## 2020-01-10 LAB — COMPREHENSIVE METABOLIC PANEL
ALT: 89 U/L — ABNORMAL HIGH (ref 0–44)
AST: 80 U/L — ABNORMAL HIGH (ref 15–41)
Albumin: 2.4 g/dL — ABNORMAL LOW (ref 3.5–5.0)
Alkaline Phosphatase: 109 U/L (ref 38–126)
Anion gap: 10 (ref 5–15)
BUN: 9 mg/dL (ref 8–23)
CO2: 21 mmol/L — ABNORMAL LOW (ref 22–32)
Calcium: 8.6 mg/dL — ABNORMAL LOW (ref 8.9–10.3)
Chloride: 110 mmol/L (ref 98–111)
Creatinine, Ser: 1.1 mg/dL — ABNORMAL HIGH (ref 0.44–1.00)
GFR calc Af Amer: 51 mL/min — ABNORMAL LOW (ref >60)
GFR calc non Af Amer: 44 mL/min — ABNORMAL LOW (ref >60)
Glucose, Bld: 86 mg/dL (ref 70–99)
Potassium: 4 mmol/L (ref 3.5–5.1)
Sodium: 141 mmol/L (ref 135–145)
Total Bilirubin: 1.1 mg/dL (ref 0.3–1.2)
Total Protein: 5.1 g/dL — ABNORMAL LOW (ref 6.5–8.1)

## 2020-01-10 LAB — MAGNESIUM: Magnesium: 1.9 mg/dL (ref 1.7–2.4)

## 2020-01-10 LAB — GLUCOSE, CAPILLARY
Glucose-Capillary: 77 mg/dL (ref 70–99)
Glucose-Capillary: 106 mg/dL — ABNORMAL HIGH (ref 70–99)
Glucose-Capillary: 119 mg/dL — ABNORMAL HIGH (ref 70–99)
Glucose-Capillary: 118 mg/dL — ABNORMAL HIGH (ref 70–99)

## 2020-01-10 LAB — LIPASE, BLOOD: Lipase: 256 U/L — ABNORMAL HIGH (ref 11–51)

## 2020-01-10 MED ORDER — MORPHINE SULFATE (PF) 2 MG/ML IV SOLN
2.0000 mg | INTRAVENOUS | Status: DC | PRN
  Administered 2020-01-10 – 2020-01-11 (×2): 2 mg via INTRAVENOUS
  Filled 2020-01-10 (×2): qty 1

## 2020-01-10 MED ORDER — PANTOPRAZOLE SODIUM 40 MG IV SOLR
40.0000 mg | INTRAVENOUS | Status: DC
  Administered 2020-01-10 – 2020-01-16 (×7): 40 mg via INTRAVENOUS
  Filled 2020-01-10 (×7): qty 40

## 2020-01-10 MED ORDER — KCL IN DEXTROSE-NACL 10-5-0.45 MEQ/L-%-% IV SOLN
INTRAVENOUS | Status: DC
  Filled 2020-01-10 (×2): qty 1000

## 2020-01-10 MED ORDER — ENOXAPARIN SODIUM 40 MG/0.4ML ~~LOC~~ SOLN
40.0000 mg | SUBCUTANEOUS | Status: DC
  Administered 2020-01-10 – 2020-01-16 (×6): 40 mg via SUBCUTANEOUS
  Filled 2020-01-10 (×6): qty 0.4

## 2020-01-10 MED ORDER — ALUM & MAG HYDROXIDE-SIMETH 200-200-20 MG/5ML PO SUSP
30.0000 mL | Freq: Four times a day (QID) | ORAL | Status: DC | PRN
  Administered 2020-01-10 – 2020-01-14 (×3): 30 mL via ORAL
  Filled 2020-01-10 (×3): qty 30

## 2020-01-10 MED ORDER — SODIUM CHLORIDE 0.9 % IV SOLN
2.0000 g | INTRAVENOUS | Status: DC
  Administered 2020-01-10 – 2020-01-12 (×3): 2 g via INTRAVENOUS
  Filled 2020-01-10: qty 2
  Filled 2020-01-10: qty 20
  Filled 2020-01-10 (×2): qty 2

## 2020-01-10 MED ORDER — LACTATED RINGERS IV SOLN: INTRAVENOUS | Status: DC

## 2020-01-10 NOTE — Progress Notes (Signed)
[facility] Gastroenterology Progress Note  Monique Lin 86 y.o. Aug 30, 1927   Subjective: Feels ok. Just had a nonbloody stool per nursing.  Objective: Vital signs: Vitals:   01/09/20 2300 01/10/20 0815  BP: (!) 132/52 129/86  Pulse: 90 94  Resp: 16 19  Temp: 98.6 F (37 C) 99.7 F (37.6 C)  SpO2: 99% 100%    Physical Exam: Gen: lethargic, elderly, obese, no acute distress  HEENT: anicteric sclera CV: RRR Chest: CTA B Abd: upper quadrant tenderness with guarding, soft, nondistended, +BS Ext: no edema  Lab Results: Recent Labs    01/09/20 0131 01/10/20 0300  NA 141 141  K 4.0 4.0  CL 107 110  CO2 23 21*  GLUCOSE 153* 86  BUN 14 9  CREATININE 1.17* 1.10*  CALCIUM 9.6 8.6*  MG  --  1.9  PHOS  --  3.8   Recent Labs    01/09/20 0131 01/10/20 0300  AST 405* 80*  ALT 140* 89*  ALKPHOS 145* 109  BILITOT 1.3* 1.1  PROT 6.3* 5.1*  ALBUMIN 3.2* 2.4*   Recent Labs    01/09/20 0131 01/10/20 0300  WBC 8.4 12.6*  HGB 9.5* 9.1*  HCT 30.8* 29.6*  MCV 94.5 94.3  PLT 216 178      Assessment/Plan: Gallstone pancreatitis with marked improvement in LFTs and likely passed the stone in the distal common bile duct. May not need ERCP but will need EGD to biopsy the gastric lesion. F/U on LFTs tomorrow and if LFTs remain low then plan for EGD on Monday. Continue IV Abx for now. Clear liquid diet today and if ok change to full liquids tomorrow. Updated daughter, Monique Lin by phone.   [name] 01/10/2020, 12:18 PM  Questions please call [phone] ID: Monique Lin, female   DOB: 1927/01/14, 86 y.o.   MRN: [id number]

## 2020-01-10 NOTE — Progress Notes (Signed)
PROGRESS NOTE  Monique Lin MWN:[id number] DOB: 05-03-1927   PCP: [name], MD  Patient is from: Home.  Uses walker and Rollator at baseline.  DOA: 01/09/2020 LOS: 1  Brief Narrative / Interim history: 86 year old female with history of GI stromal tumor s/p resection in 2011, HTN,  Hypothyroidism, anemia and recent "cardiac arrest  ROSC after 1 minute of CPR" presenting with abdominal pain and admitted for acute gallstone pancreatitis and necrotic gastric mass as noted on CT abdomen and pelvis.  Lipase elevated to 2500.  AST 405.  ALT 140.  Total bili 1.3.   The next day, GI and general surgery consulted.  Plan is for EGD and EUS once pancreatitis resolves.  Subjective: No major events overnight or this morning.  She has mild temp this morning.  Denies abdominal pain this morning.  She says his stomach feels empty.  Denies chest pain, shortness of breath, cough or UTI symptoms.  Objective: Vitals:   01/09/20 1703 01/09/20 2019 01/09/20 2300 01/10/20 0815  BP: (!) 142/53  (!) 132/52 129/86  Pulse: 92 84 90 94  Resp: 16 16 16 19   Temp: 99.1 F (37.3 C)  98.6 F (37 C) 99.7 F (37.6 C)  TempSrc: Oral  Oral Oral  SpO2: 98% 99% 99% 100%  Weight:      Height:        Intake/Output Summary (Last 24 hours) at 01/10/2020 1053 Last data filed at 01/10/2020 0549 Gross per 24 hour  Intake 2393.2 ml  Output --  Net 2393.2 ml   Filed Weights   01/09/20 0800  Weight: 79.4 kg    Examination:  GENERAL: No acute distress.  Appears well.  HEENT: MMM.  Vision and hearing grossly intact.  NECK: Supple.  No apparent JVD.  RESP:  No IWOB. Good air movement bilaterally. CVS:  RRR. Heart sounds normal.  ABD/GI/GU: Bowel sounds present. Soft.  Mild epigastric and RUQ tenderness.  No guarding or rebound. MSK/EXT:  Moves extremities. No apparent deformity. No edema.  SKIN: no apparent skin lesion or wound NEURO: Awake, alert and oriented appropriately.  No apparent focal neuro  deficit. PSYCH: Calm. Normal affect.  Procedures:  None  Assessment & Plan: Acute calculus pancreatitis: Noted on CT abdomen and pelvis.  Abdominal pain resolved.  LFT and lipase improved.  She likely passed the stone.  Has no fever or leukocytosis to suggest cholangitis. -GI following-plan for ERCP+/-EUS once pancreatitis resolves. -Continue n.p.o., IV fluid, antibiotics (Cipro and Flagyl) and pain control  Necrotic gastric mass in patient with history of GI stromal tumor resected in 2011.  Noted on CT abdomen and pelvis this admission. -GI and general surgery following.  Elevated liver enzymes/hyperbilirubinemia: Likely due to the above.  Improved. -Continue monitoring  Essential hypertension: Normotensive -As needed labetalol  Hypothyroidism -Continue IV Synthroid while n.p.o.  AKI on CKD-3a: Resolved. -Continue IV fluid as above while NPO.  Normocytic anemia: Baseline Hgb 8-9> 9.5 (admit)> 9.1.  Relatively stable.  -Check anemia panel  History of "cardiac arrest".  -Telemetry monitoring  Leukocytosis: Likely due to #1. -Continue monitoring  Debility: Uses walker and Rollator at baseline. -PT/OT eval               DVT prophylaxis: Start subcu Lovenox. Code Status: Full code Family Communication: Updated patient's daughter over the phone.  Discharge barrier: Pancreatitis and necrotic GI mass Patient is from: Home Final disposition: Likely home when stable medically and cleared by GI and general surgery.  Consultants: General  surgery and GI   Microbiology summarized: COVID-19 negative Influenza PCR negative  Sch Meds:  Scheduled Meds: . levothyroxine  25 mcg Intravenous Daily   Continuous Infusions: . sodium chloride    . ciprofloxacin 400 mg (01/10/20 0647)  . dextrose 5 % and 0.45 % NaCl with KCl 10 mEq/L 75 mL/hr at 01/10/20 0850  . metronidazole 500 mg (01/10/20 0516)   PRN Meds:.acetaminophen **OR** acetaminophen, ipratropium-albuterol,  labetalol, morphine injection, ondansetron **OR** ondansetron (ZOFRAN) IV, oxymetazoline  Antimicrobials: Anti-infectives (From admission, onward)   Start     Dose/Rate Route Frequency Ordered Stop   01/09/20 1830  ciprofloxacin (CIPRO) IVPB 400 mg     400 mg 200 mL/hr over 60 Minutes Intravenous Every 12 hours 01/09/20 0853     01/09/20 1400  metroNIDAZOLE (FLAGYL) IVPB 500 mg     500 mg 100 mL/hr over 60 Minutes Intravenous Every 8 hours 01/09/20 0611     01/09/20 0630  ciprofloxacin (CIPRO) IVPB 400 mg     400 mg 200 mL/hr over 60 Minutes Intravenous  Once 01/09/20 0616 01/09/20 0909   01/09/20 0445  cefTRIAXone (ROCEPHIN) 1 g in sodium chloride 0.9 % 100 mL IVPB     1 g 200 mL/hr over 30 Minutes Intravenous  Once 01/09/20 0432 01/09/20 0516   01/09/20 0445  metroNIDAZOLE (FLAGYL) IVPB 500 mg     500 mg 100 mL/hr over 60 Minutes Intravenous  Once 01/09/20 0432 01/09/20 9767       I have personally reviewed the following labs and images: CBC: Recent Labs  Lab 01/09/20 0131 01/10/20 0300  WBC 8.4 12.6*  HGB 9.5* 9.1*  HCT 30.8* 29.6*  MCV 94.5 94.3  PLT 216 178   BMP &GFR Recent Labs  Lab 01/09/20 0131 01/10/20 0300  NA 141 141  K 4.0 4.0  CL 107 110  CO2 23 21*  GLUCOSE 153* 86  BUN 14 9  CREATININE 1.17* 1.10*  CALCIUM 9.6 8.6*  MG  --  1.9  PHOS  --  3.8   Estimated Creatinine Clearance: 33.5 mL/min (A) (by C-G formula based on SCr of 1.1 mg/dL (H)). Liver & Pancreas: Recent Labs  Lab 01/09/20 0131 01/10/20 0300  AST 405* 80*  ALT 140* 89*  ALKPHOS 145* 109  BILITOT 1.3* 1.1  PROT 6.3* 5.1*  ALBUMIN 3.2* 2.4*   Recent Labs  Lab 01/09/20 0131 01/10/20 0300  LIPASE 2,582* 256*   No results for input(s): AMMONIA in the last 168 hours. Diabetic: No results for input(s): HGBA1C in the last 72 hours. Recent Labs  Lab 01/09/20 1153 01/09/20 2114 01/10/20 0508  GLUCAP 105* 91 77   Cardiac Enzymes: No results for input(s): CKTOTAL, CKMB,  CKMBINDEX, TROPONINI in the last 168 hours. No results for input(s): PROBNP in the last 8760 hours. Coagulation Profile: Recent Labs  Lab 01/09/20 1501  INR 1.2   Thyroid Function Tests: No results for input(s): TSH, T4TOTAL, FREET4, T3FREE, THYROIDAB in the last 72 hours. Lipid Profile: No results for input(s): CHOL, HDL, LDLCALC, TRIG, CHOLHDL, LDLDIRECT in the last 72 hours. Anemia Panel: No results for input(s): VITAMINB12, FOLATE, FERRITIN, TIBC, IRON, RETICCTPCT in the last 72 hours. Urine analysis:    Component Value Date/Time   COLORURINE YELLOW 12/11/2019 1337   APPEARANCEUR CLEAR 12/11/2019 1337   LABSPEC 1.018 12/11/2019 1337   PHURINE 5.0 12/11/2019 1337   GLUCOSEU NEGATIVE 12/11/2019 1337   HGBUR NEGATIVE 12/11/2019 [place] 12/11/2019 [place]  NEGATIVE 12/11/2019 1337   PROTEINUR 30 (A) 12/11/2019 1337   UROBILINOGEN 0.2 11/12/2010 2324   NITRITE NEGATIVE 12/11/2019 1337   LEUKOCYTESUR NEGATIVE 12/11/2019 1337   Sepsis Labs: Invalid input(s): PROCALCITONIN, [place]  Microbiology: Recent Results (from the past 240 hour(s))  Respiratory Panel by RT PCR (Flu A&B, Covid) - Nasopharyngeal Swab     Status: None   Collection Time: 01/09/20  4:16 AM   Specimen: Nasopharyngeal Swab  Result Value Ref Range Status   SARS Coronavirus 2 by RT PCR NEGATIVE NEGATIVE Final    Comment: (NOTE) SARS-CoV-2 target nucleic acids are NOT DETECTED. The SARS-CoV-2 RNA is generally detectable in upper respiratoy specimens during the acute phase of infection. The lowest concentration of SARS-CoV-2 viral copies this assay can detect is 131 copies/mL. A negative result does not preclude SARS-Cov-2 infection and should not be used as the sole basis for treatment or other patient management decisions. A negative result may occur with  improper specimen collection/handling, submission of specimen other than nasopharyngeal swab, presence of viral  mutation(s) within the areas targeted by this assay, and inadequate number of viral copies (<131 copies/mL). A negative result must be combined with clinical observations, patient history, and epidemiological information. The expected result is Negative. Fact Sheet for Patients:  PinkCheek.be Fact Sheet for Healthcare Providers:  GravelBags.it This test is not yet ap proved or cleared by the [place] FDA and  has been authorized for detection and/or diagnosis of SARS-CoV-2 by FDA under an Emergency Use Authorization (EUA). This EUA will remain  in effect (meaning this test can be used) for the duration of the COVID-19 declaration under Section 564(b)(1) of the Act, 21 U.S.C. section 360bbb-3(b)(1), unless the authorization is terminated or revoked sooner.    Influenza A by PCR NEGATIVE NEGATIVE Final   Influenza B by PCR NEGATIVE NEGATIVE Final    Comment: (NOTE) The Xpert Xpress SARS-CoV-2/FLU/RSV assay is intended as an aid in  the diagnosis of influenza from Nasopharyngeal swab specimens and  should not be used as a sole basis for treatment. Nasal washings and  aspirates are unacceptable for Xpert Xpress SARS-CoV-2/FLU/RSV  testing. Fact Sheet for Patients: PinkCheek.be Fact Sheet for Healthcare Providers: GravelBags.it This test is not yet approved or cleared by the [place] FDA and  has been authorized for detection and/or diagnosis of SARS-CoV-2 by  FDA under an Emergency Use Authorization (EUA). This EUA will remain  in effect (meaning this test can be used) for the duration of the  Covid-19 declaration under Section 564(b)(1) of the Act, 21  U.S.C. section 360bbb-3(b)(1), unless the authorization is  terminated or revoked. Performed at [facility], [place] [address], [place], [place] [address]     Radiology Studies: No results  found.    Zyon Grout T. [place]  If 7PM-7AM, please contact night-coverage [email or web address] Password [facility] 01/10/2020, 10:53 AM

## 2020-01-11 DIAGNOSIS — D509 Iron deficiency anemia, unspecified: Secondary | ICD-10-CM

## 2020-01-11 LAB — VITAMIN B12: Vitamin B-12: 5673 pg/mL — ABNORMAL HIGH (ref 180–914)

## 2020-01-11 LAB — CBC
HCT: 27.4 % — ABNORMAL LOW (ref 36.0–46.0)
Hemoglobin: 8.7 g/dL — ABNORMAL LOW (ref 12.0–15.0)
MCH: 29.3 pg (ref 26.0–34.0)
MCHC: 31.8 g/dL (ref 30.0–36.0)
MCV: 92.3 fL (ref 80.0–100.0)
Platelets: 165 10*3/uL (ref 150–400)
RBC: 2.97 MIL/uL — ABNORMAL LOW (ref 3.87–5.11)
RDW: 18.2 % — ABNORMAL HIGH (ref 11.5–15.5)
WBC: 12.9 10*3/uL — ABNORMAL HIGH (ref 4.0–10.5)
nRBC: 0 % (ref 0.0–0.2)

## 2020-01-11 LAB — GLUCOSE, CAPILLARY
Glucose-Capillary: 100 mg/dL — ABNORMAL HIGH (ref 70–99)
Glucose-Capillary: 117 mg/dL — ABNORMAL HIGH (ref 70–99)
Glucose-Capillary: 104 mg/dL — ABNORMAL HIGH (ref 70–99)
Glucose-Capillary: 106 mg/dL — ABNORMAL HIGH (ref 70–99)

## 2020-01-11 LAB — IRON AND TIBC
Iron: 13 ug/dL — ABNORMAL LOW (ref 28–170)
Saturation Ratios: 7 % — ABNORMAL LOW (ref 10.4–31.8)
TIBC: 193 ug/dL — ABNORMAL LOW (ref 250–450)
UIBC: 180 ug/dL

## 2020-01-11 LAB — HEPATIC FUNCTION PANEL
ALT: 55 U/L — ABNORMAL HIGH (ref 0–44)
AST: 34 U/L (ref 15–41)
Albumin: 2.2 g/dL — ABNORMAL LOW (ref 3.5–5.0)
Alkaline Phosphatase: 91 U/L (ref 38–126)
Bilirubin, Direct: 0.2 mg/dL (ref 0.0–0.2)
Indirect Bilirubin: 0.6 mg/dL (ref 0.3–0.9)
Total Bilirubin: 0.8 mg/dL (ref 0.3–1.2)
Total Protein: 5.2 g/dL — ABNORMAL LOW (ref 6.5–8.1)

## 2020-01-11 LAB — RETICULOCYTES
Immature Retic Fract: 12 % (ref 2.3–15.9)
RBC.: 2.98 MIL/uL — ABNORMAL LOW (ref 3.87–5.11)
Retic Count, Absolute: 53 10*3/uL (ref 19.0–186.0)
Retic Ct Pct: 1.8 % (ref 0.4–3.1)

## 2020-01-11 LAB — FERRITIN: Ferritin: 79 ng/mL (ref 11–307)

## 2020-01-11 LAB — LIPASE, BLOOD: Lipase: 53 U/L — ABNORMAL HIGH (ref 11–51)

## 2020-01-11 LAB — FOLATE: Folate: 32.4 ng/mL (ref >5.9)

## 2020-01-11 LAB — MAGNESIUM: Magnesium: 2 mg/dL (ref 1.7–2.4)

## 2020-01-11 LAB — PHOSPHORUS: Phosphorus: 2.8 mg/dL (ref 2.5–4.6)

## 2020-01-11 MED ORDER — LEVOTHYROXINE SODIUM 50 MCG PO TABS
50.0000 ug | ORAL_TABLET | Freq: Every day | ORAL | Status: DC
  Administered 2020-01-11 – 2020-01-14 (×3): 50 ug via ORAL
  Filled 2020-01-11 (×5): qty 1

## 2020-01-11 MED ORDER — SODIUM CHLORIDE 0.9 % IV SOLN: INTRAVENOUS | Status: DC

## 2020-01-11 MED ORDER — SODIUM CHLORIDE 0.9 % IV SOLN
510.0000 mg | Freq: Once | INTRAVENOUS | Status: AC
  Administered 2020-01-11: 510 mg via INTRAVENOUS
  Filled 2020-01-11: qty 17

## 2020-01-11 MED ORDER — MORPHINE SULFATE (PF) 2 MG/ML IV SOLN
1.0000 mg | INTRAVENOUS | Status: DC | PRN
  Administered 2020-01-11 – 2020-01-13 (×9): 1 mg via INTRAVENOUS
  Filled 2020-01-11 (×10): qty 1

## 2020-01-11 MED ORDER — CARVEDILOL 3.125 MG PO TABS
3.1250 mg | ORAL_TABLET | Freq: Two times a day (BID) | ORAL | Status: DC
  Administered 2020-01-11 – 2020-01-16 (×10): 3.125 mg via ORAL
  Filled 2020-01-11 (×12): qty 1

## 2020-01-11 NOTE — Progress Notes (Signed)
PT Cancellation Note  Patient Details Name: SAYGE BRIENZA MRN: [id number] DOB: 1927/01/03   Cancelled Treatment:    Reason Eval/Treat Not Completed: Patient at procedure or test/unavailable  Patient's family member stated she is attending church (virtually) and refused PT at this time. Will attempt to see later today as schedule permits.   Arby Barrette, PT Pager [phone]  Jeanie Cooks Aran Menning 01/11/2020, 11:11 AM

## 2020-01-11 NOTE — Progress Notes (Signed)
[facility] Gastroenterology Progress Note  Monique Lin 86 y.o. October 21, 1927   Subjective: Sleeping comfortably. Denies abdominal pain, nausea, or vomiting. Daughter in room.  Objective: Vital signs: Vitals:   01/11/20 0017 01/11/20 0805  BP:  (!) 141/56  Pulse: 92 99  Resp: 20 19  Temp:  99.8 F (37.7 C)  SpO2: 97% 97%    Physical Exam: Gen: lethargic, elderly, obese, no acute distress  HEENT: anicteric sclera CV: RRR Chest: CTA B Abd: soft, nontender, nondistended, +BS Ext: no edema  Lab Results: Recent Labs    01/09/20 0131 01/10/20 0300 01/11/20 0543  NA 141 141  --   K 4.0 4.0  --   CL 107 110  --   CO2 23 21*  --   GLUCOSE 153* 86  --   BUN 14 9  --   CREATININE 1.17* 1.10*  --   CALCIUM 9.6 8.6*  --   MG  --  1.9 2.0  PHOS  --  3.8 2.8   Recent Labs    01/10/20 0300 01/11/20 0543  AST 80* 34  ALT 89* 55*  ALKPHOS 109 91  BILITOT 1.1 0.8  PROT 5.1* 5.2*  ALBUMIN 2.4* 2.2*   Recent Labs    01/10/20 0300 01/11/20 0543  WBC 12.6* 12.9*  HGB 9.1* 8.7*  HCT 29.6* 27.4*  MCV 94.3 92.3  PLT 178 165      Assessment/Plan: Resolving gallstone pancreatitis - likely passed gallstone. LFTs normalizing. On IV Cefriaxone and Metronidazole. Malignant-appearing gastric lesion likely recurrence of GIST. EGD tomorrow for biopsies. Full liquid diet and NPO p MN.    Monique Lin 01/11/2020, 2:42 PM  Questions please call [phone] ID: Monique Lin, female   DOB: Mar 07, 1927, 86 y.o.   MRN: [id number]

## 2020-01-11 NOTE — Plan of Care (Signed)
  Problem: Education: Goal: Knowledge of General Education information will improve Description: Including pain rating scale, medication(s)/side effects and non-pharmacologic comfort measures Outcome: Progressing   Problem: Health Behavior/Discharge Planning: Goal: Ability to manage health-related needs will improve Outcome: Progressing   Problem: Clinical Measurements: Goal: Ability to maintain clinical measurements within normal limits will improve Outcome: Progressing Goal: Will remain free from infection Outcome: Progressing Goal: Diagnostic test results will improve Outcome: Progressing Goal: Respiratory complications will improve Outcome: Progressing Goal: Cardiovascular complication will be avoided Outcome: Progressing   Problem: Activity: Goal: Risk for activity intolerance will decrease Outcome: Progressing   Problem: Nutrition: Goal: Adequate nutrition will be maintained Outcome: Progressing   Problem: Coping: Goal: Level of anxiety will decrease Outcome: Progressing   Problem: Elimination: Goal: Will not experience complications related to bowel motility Outcome: Progressing Goal: Will not experience complications related to urinary retention Outcome: Progressing   Problem: Pain Managment: Goal: General experience of comfort will improve Outcome: Progressing   Problem: Safety: Goal: Ability to remain free from injury will improve Outcome: Progressing   Problem: Skin Integrity: Goal: Risk for impaired skin integrity will decrease Outcome: Progressing   Problem: Bowel/Gastric: Goal: Will show no signs and symptoms of gastrointestinal bleeding Outcome: Progressing   Problem: Clinical Measurements: Goal: Complications related to the disease process, condition or treatment will be avoided or minimized Outcome: Progressing   Problem: Nutritional: Goal: Ability to achieve adequate nutritional intake will improve Outcome: Progressing

## 2020-01-11 NOTE — Progress Notes (Signed)
The chaplain visited as a result of a page from the nurse. The family and the patient requested to have some documents notarized prior to surgery on monday.  The chaplain helped explain the advanced directive paperwork and gave them the brochure. The chaplain will refer to the chaplains on shift tomorrow but let the patient know that it might not get done.  The chaplain also offered prayer for the patient and family.  The chaplain will refer to the chaplains that will be on duty on Monday.  Brion Aliment Chaplain Resident For questions concerning this note please contact me by pager [phone]

## 2020-01-11 NOTE — Evaluation (Signed)
Physical Therapy Evaluation Patient Details Name: Monique Lin MRN: [id number] DOB: 1927-11-24 Today's Date: 01/11/2020   History of Present Illness  86 y.o. female with history of GI stromal tumor status post resection in 2011 with history of hypertension hypothyroidism who was recently admitted for possible cardiac arrest requiring CPR for 1 minute at this time patient was found to be in anemia was transfused and discharged home started experiencing severe epigastric pain since yesterday. CT scan showing necrotic gastric tumor and also CBD stone with pancreatitis. 2/6 GI noted pt improved and likely passed the stone. To have EGD for biopsy of tumor 2/8  Clinical Impression  Pt admitted with above diagnosis. Patient recently discharged from SNF to home and daughters have been caring for her (one daughter works from home and is with her during day). Patient had recently progressed to walking the length of the hallway with HHPT and chair following her for seated rest. Daughter present during evaluation and noted pt has already become weaker than she was. Pt currently with functional limitations due to the deficits listed below (see PT Problem List). Pt will benefit from skilled PT to increase their independence and safety with mobility to allow discharge to the venue listed below.       Follow Up Recommendations Home health PT;Supervision/Assistance - 24 hour *if continues to decline after procedures/testing, she may again need SNF    Equipment Recommendations  None recommended by PT    Recommendations for Other Services OT consult     Precautions / Restrictions Precautions Precautions: Fall      Mobility  Bed Mobility Overal bed mobility: Needs Assistance Bed Mobility: Rolling;Sidelying to Sit;Sit to Sidelying Rolling: Supervision(wtih rail) Sidelying to sit: Min assist;HOB elevated(with rail; HOB 20)     Sit to sidelying: Min assist General bed mobility comments: returned to bed onto  left side with min assist to raise legs onto bed; rolled onto her back without assist  Transfers Overall transfer level: Needs assistance Equipment used: Rolling walker (2 wheeled) Transfers: Sit to/from [facility] Sit to Stand: Min assist Stand pivot transfers: Min assist       General transfer comment: pt with IV in her rt hand and too painful to push off surface; allowed her to put rt hand on RW with PT anchoring her walker as she cane to stand (no physical boost needed)  Ambulation/Gait                Stairs            Wheelchair Mobility    Modified Rankin (Stroke Patients Only)       Balance Overall balance assessment: Needs assistance Sitting-balance support: No upper extremity supported;Feet supported Sitting balance-Leahy Scale: Good     Standing balance support: Bilateral upper extremity supported Standing balance-Leahy Scale: Poor Standing balance comment: reliant on UE support                             Pertinent Vitals/Pain Pain Assessment: Faces Faces Pain Scale: Hurts little more Pain Location: low back after sitting EOB for nebulizer treatment Pain Descriptors / Indicators: Discomfort;Aching Pain Intervention(s): Limited activity within patient's tolerance;Monitored during session;Repositioned    Home Living Family/patient expects to be discharged to:: Private residence Living Arrangements: Children Available Help at Discharge: Family;Available 24 hours/day Type of Home: House Home Access: Stairs to enter Entrance Stairs-Rails: Right;Left;Can reach both Entrance Stairs-Number of Steps: 2 Home Layout: One  level Home Equipment: Walker - 2 wheels;Cane - single point;Walker - 4 wheels;Bedside commode;Wheelchair - manual      Prior Function Level of Independence: Needs assistance   Gait / Transfers Assistance Needed: walks across bedroom "on good days" ; can walk from car to front steps when goes out  ADL's  / Homemaking Assistance Needed: sits on chair and daughter washes her        Hand Dominance   Dominant Hand: Right    Extremity/Trunk Assessment   Upper Extremity Assessment Upper Extremity Assessment: Generalized weakness    Lower Extremity Assessment Lower Extremity Assessment: Generalized weakness    Cervical / Trunk Assessment Cervical / Trunk Assessment: Other exceptions Cervical / Trunk Exceptions: obese  Communication   Communication: HOH  Cognition Arousal/Alertness: Awake/alert Behavior During Therapy: WFL for tasks assessed/performed Overall Cognitive Status: Within Functional Limits for tasks assessed                                        General Comments General comments (skin integrity, edema, etc.): VSS on room air; daughter arrived while pt sitting on EOB    Exercises     Assessment/Plan    PT Assessment Patient needs continued PT services  PT Problem List Decreased strength;Decreased activity tolerance;Decreased balance;Decreased mobility;Decreased knowledge of use of DME;Decreased safety awareness;Decreased knowledge of precautions;Obesity       PT Treatment Interventions DME instruction;Gait training;Stair training;Functional mobility training;Therapeutic activities;Therapeutic exercise;Balance training;Neuromuscular re-education;Patient/family education    PT Goals (Current goals can be found in the Care Plan section)  Acute Rehab PT Goals Patient Stated Goal: feel better; be able to walk PT Goal Formulation: With patient/family Time For Goal Achievement: 01/25/20 Potential to Achieve Goals: Good    Frequency Min 3X/week   Barriers to discharge        Co-evaluation               AM-PAC PT "6 Clicks" Mobility  Outcome Measure Help needed turning from your back to your side while in a flat bed without using bedrails?: A Little Help needed moving from lying on your back to sitting on the side of a flat bed without  using bedrails?: A Little Help needed moving to and from a bed to a chair (including a wheelchair)?: A Little Help needed standing up from a chair using your arms (e.g., wheelchair or bedside chair)?: A Little Help needed to walk in hospital room?: A Lot Help needed climbing 3-5 steps with a railing? : A Lot 6 Click Score: 16    End of Session Equipment Utilized During Treatment: Gait belt Activity Tolerance: Patient limited by fatigue Patient left: in bed;with call bell/phone within reach;with family/visitor present Nurse Communication: Mobility status PT Visit Diagnosis: Muscle weakness (generalized) (M62.81);Unsteadiness on feet (R26.81)    Time: 0086-7619 PT Time Calculation (min) (ACUTE ONLY): 71 min Note: full amount of time not billable due to nebulizer treatment and time on [facility]  Charges:   PT Evaluation $PT Eval Low Complexity: 1 Low PT Treatments $Therapeutic Activity: 23-37 mins         Arby Barrette, PT Pager [phone]   Rexanne Mano 01/11/2020, 5:38 PM

## 2020-01-11 NOTE — Progress Notes (Signed)
PROGRESS NOTE  Monique Lin WLN:[id number] DOB: 1927-11-01   PCP: [name], MD  Patient is from: Home.  Uses walker and Rollator at baseline.  DOA: 01/09/2020 LOS: 2  Brief Narrative / Interim history: 86 year old female with history of GI stromal tumor s/p resection in 2011, HTN,  Hypothyroidism, anemia and recent "cardiac arrest  ROSC after 1 minute of CPR" presenting with abdominal pain and admitted for acute gallstone pancreatitis and necrotic gastric mass as noted on CT abdomen and pelvis.  Lipase elevated to 2500.  AST 405.  ALT 140.  Total bili 1.3.   The next day, GI and general surgery consulted.  Plan is for EGD and EUS once pancreatitis resolves.  Subjective: No major events overnight or this morning.  She says she did not have a good sleep last night as she had to be cleaned after having bowel movements/accidents.  No other complaints.  Abdominal pain improved.  Denies chest pain, dyspnea, nausea or vomiting.  Objective: Vitals:   01/10/20 2221 01/11/20 0017 01/11/20 0500 01/11/20 0805  BP: 133/60   (!) 141/56  Pulse: 96 92  99  Resp: 14 20  19   Temp: 98.3 F (36.8 C)   99.8 F (37.7 C)  TempSrc:      SpO2: 96% 97%  97%  Weight:   82.8 kg   Height:        Intake/Output Summary (Last 24 hours) at 01/11/2020 1027 Last data filed at 01/11/2020 7408 Gross per 24 hour  Intake --  Output 600 ml  Net -600 ml   Filed Weights   01/09/20 0800 01/11/20 0500  Weight: 79.4 kg 82.8 kg    Examination:  GENERAL: No acute distress.  Appears well.  HEENT: MMM.  Vision and hearing grossly intact.  NECK: Supple.  No apparent JVD.  RESP:  No IWOB. Good air movement bilaterally. CVS:  RRR. Heart sounds normal.  ABD/GI/GU: Bowel sounds present. Soft. Non tender.  MSK/EXT:  Moves extremities. No apparent deformity. No edema.  SKIN: no apparent skin lesion or wound NEURO: Awake, alert and oriented appropriately.  No apparent focal neuro deficit. PSYCH: Calm. Normal  affect.  Procedures:  None  Assessment & Plan: Acute calculus pancreatitis: Noted on CT abdomen and pelvis.  Abdominal pain resolved.  LFT and lipase resolving.  She likely passed the stone.  Has no fever or leukocytosis to suggest cholangitis. -GI following-plan for ERCP+/-EUS once pancreatitis resolves. -Continue clear liquid diet, antibiotics (CTX and Flagyl) and pain control -Discontinue IV fluid  Necrotic gastric mass in patient with history of GI stromal tumor resected in 2011.  Noted on CT abdomen and pelvis this admission.  Concern about recurrence of GI stromal cancer -GI and general surgery following.  Elevated liver enzymes/hyperbilirubinemia: Likely due to the above.  Resolving -Continue monitoring  Essential hypertension: Normotensive for most part -Resume home Coreg at reduced dose -As needed labetalol  Hypothyroidism -Change to oral Synthroid  AKI on CKD-3a: Resolved. -Continue monitoring  Iron deficiency anemia: Baseline Hgb 8-9> 9.5 (admit)> 9.1> 8.7.  Iron saturation 7%.  Relatively stable.  -IV Feraheme  History of "cardiac arrest".  -Telemetry monitoring  Leukocytosis: Likely due to #1. -Antibiotics as above -Continue monitoring  Debility: Uses walker and Rollator at baseline. -PT/OT eval               DVT prophylaxis: Continue subcu Lovenox Code Status: Full code Family Communication: Updated patient's daughter over the phone on 2/6.  Discharge barrier: Acute pancreatitis  and necrotic GI mass Patient is from: Home Final disposition: Likely home when stable medically and cleared by GI and general surgery.  Consultants: General surgery and GI   Microbiology summarized: COVID-19 negative Influenza PCR negative  Sch Meds:  Scheduled Meds: . enoxaparin (LOVENOX) injection  40 mg Subcutaneous Q24H  . levothyroxine  25 mcg Intravenous Daily  . pantoprazole (PROTONIX) IV  40 mg Intravenous Q24H   Continuous Infusions: . sodium  chloride 1,000 mL (01/10/20 2125)  . cefTRIAXone (ROCEPHIN)  IV 2 g (01/10/20 1313)  . dextrose 5 % and 0.45 % NaCl with KCl 10 mEq/L 75 mL/hr at 01/10/20 0850  . metronidazole 500 mg (01/11/20 0604)   PRN Meds:.acetaminophen **OR** acetaminophen, alum & mag hydroxide-simeth, ipratropium-albuterol, labetalol, morphine injection, ondansetron **OR** ondansetron (ZOFRAN) IV, oxymetazoline  Antimicrobials: Anti-infectives (From admission, onward)   Start     Dose/Rate Route Frequency Ordered Stop   01/10/20 1400  cefTRIAXone (ROCEPHIN) 2 g in sodium chloride 0.9 % 100 mL IVPB     2 g 200 mL/hr over 30 Minutes Intravenous Every 24 hours 01/10/20 1213     01/09/20 1830  ciprofloxacin (CIPRO) IVPB 400 mg  Status:  Discontinued     400 mg 200 mL/hr over 60 Minutes Intravenous Every 12 hours 01/09/20 0853 01/10/20 1213   01/09/20 1400  metroNIDAZOLE (FLAGYL) IVPB 500 mg     500 mg 100 mL/hr over 60 Minutes Intravenous Every 8 hours 01/09/20 0611     01/09/20 0630  ciprofloxacin (CIPRO) IVPB 400 mg     400 mg 200 mL/hr over 60 Minutes Intravenous  Once 01/09/20 0616 01/09/20 0909   01/09/20 0445  cefTRIAXone (ROCEPHIN) 1 g in sodium chloride 0.9 % 100 mL IVPB     1 g 200 mL/hr over 30 Minutes Intravenous  Once 01/09/20 0432 01/09/20 0516   01/09/20 0445  metroNIDAZOLE (FLAGYL) IVPB 500 mg     500 mg 100 mL/hr over 60 Minutes Intravenous  Once 01/09/20 0432 01/09/20 0613       I have personally reviewed the following labs and images: CBC: Recent Labs  Lab 01/09/20 0131 01/10/20 0300 01/11/20 0543  WBC 8.4 12.6* 12.9*  HGB 9.5* 9.1* 8.7*  HCT 30.8* 29.6* 27.4*  MCV 94.5 94.3 92.3  PLT 216 178 165   BMP &GFR Recent Labs  Lab 01/09/20 0131 01/10/20 0300 01/11/20 0543  NA 141 141  --   K 4.0 4.0  --   CL 107 110  --   CO2 23 21*  --   GLUCOSE 153* 86  --   BUN 14 9  --   CREATININE 1.17* 1.10*  --   CALCIUM 9.6 8.6*  --   MG  --  1.9 2.0  PHOS  --  3.8 2.8   Estimated  Creatinine Clearance: 34.3 mL/min (A) (by C-G formula based on SCr of 1.1 mg/dL (H)). Liver & Pancreas: Recent Labs  Lab 01/09/20 0131 01/10/20 0300 01/11/20 0543  AST 405* 80* 34  ALT 140* 89* 55*  ALKPHOS 145* 109 91  BILITOT 1.3* 1.1 0.8  PROT 6.3* 5.1* 5.2*  ALBUMIN 3.2* 2.4* 2.2*   Recent Labs  Lab 01/09/20 0131 01/10/20 0300 01/11/20 0724  LIPASE 2,582* 256* 53*   No results for input(s): AMMONIA in the last 168 hours. Diabetic: No results for input(s): HGBA1C in the last 72 hours. Recent Labs  Lab 01/10/20 0508 01/10/20 1215 01/10/20 1744 01/10/20 2217 01/11/20 0614  GLUCAP 77 106* 119*  118* 100*   Cardiac Enzymes: No results for input(s): CKTOTAL, CKMB, CKMBINDEX, TROPONINI in the last 168 hours. No results for input(s): PROBNP in the last 8760 hours. Coagulation Profile: Recent Labs  Lab 01/09/20 1501  INR 1.2   Thyroid Function Tests: No results for input(s): TSH, T4TOTAL, FREET4, T3FREE, THYROIDAB in the last 72 hours. Lipid Profile: No results for input(s): CHOL, HDL, LDLCALC, TRIG, CHOLHDL, LDLDIRECT in the last 72 hours. Anemia Panel: Recent Labs    01/11/20 0724  VITAMINB12 5,673*  FOLATE 32.4  FERRITIN 79  TIBC 193*  IRON 13*  RETICCTPCT 1.8   Urine analysis:    Component Value Date/Time   COLORURINE YELLOW 12/11/2019 1337   APPEARANCEUR CLEAR 12/11/2019 1337   LABSPEC 1.018 12/11/2019 1337   PHURINE 5.0 12/11/2019 1337   GLUCOSEU NEGATIVE 12/11/2019 1337   HGBUR NEGATIVE 12/11/2019 1337   BILIRUBINUR NEGATIVE 12/11/2019 1337   KETONESUR NEGATIVE 12/11/2019 1337   PROTEINUR 30 (A) 12/11/2019 1337   UROBILINOGEN 0.2 11/12/2010 2324   NITRITE NEGATIVE 12/11/2019 1337   LEUKOCYTESUR NEGATIVE 12/11/2019 1337   Sepsis Labs: Invalid input(s): PROCALCITONIN, [place]  Microbiology: Recent Results (from the past 240 hour(s))  Respiratory Panel by RT PCR (Flu A&B, Covid) - Nasopharyngeal Swab     Status: None   Collection Time:  01/09/20  4:16 AM   Specimen: Nasopharyngeal Swab  Result Value Ref Range Status   SARS Coronavirus 2 by RT PCR NEGATIVE NEGATIVE Final    Comment: (NOTE) SARS-CoV-2 target nucleic acids are NOT DETECTED. The SARS-CoV-2 RNA is generally detectable in upper respiratoy specimens during the acute phase of infection. The lowest concentration of SARS-CoV-2 viral copies this assay can detect is 131 copies/mL. A negative result does not preclude SARS-Cov-2 infection and should not be used as the sole basis for treatment or other patient management decisions. A negative result may occur with  improper specimen collection/handling, submission of specimen other than nasopharyngeal swab, presence of viral mutation(s) within the areas targeted by this assay, and inadequate number of viral copies (<131 copies/mL). A negative result must be combined with clinical observations, patient history, and epidemiological information. The expected result is Negative. Fact Sheet for Patients:  PinkCheek.be Fact Sheet for Healthcare Providers:  GravelBags.it This test is not yet ap proved or cleared by the [place] FDA and  has been authorized for detection and/or diagnosis of SARS-CoV-2 by FDA under an Emergency Use Authorization (EUA). This EUA will remain  in effect (meaning this test can be used) for the duration of the COVID-19 declaration under Section 564(b)(1) of the Act, 21 U.S.C. section 360bbb-3(b)(1), unless the authorization is terminated or revoked sooner.    Influenza A by PCR NEGATIVE NEGATIVE Final   Influenza B by PCR NEGATIVE NEGATIVE Final    Comment: (NOTE) The Xpert Xpress SARS-CoV-2/FLU/RSV assay is intended as an aid in  the diagnosis of influenza from Nasopharyngeal swab specimens and  should not be used as a sole basis for treatment. Nasal washings and  aspirates are unacceptable for Xpert Xpress SARS-CoV-2/FLU/RSV   testing. Fact Sheet for Patients: PinkCheek.be Fact Sheet for Healthcare Providers: GravelBags.it This test is not yet approved or cleared by the [place] FDA and  has been authorized for detection and/or diagnosis of SARS-CoV-2 by  FDA under an Emergency Use Authorization (EUA). This EUA will remain  in effect (meaning this test can be used) for the duration of the  Covid-19 declaration under Section 564(b)(1) of the Act, 21  U.S.C. section 360bbb-3(b)(1), unless the authorization is  terminated or revoked. Performed at [facility], [place] [address] [place], [place] [address]     Radiology Studies: No results found.    Timo Hartwig T. [place]  If 7PM-7AM, please contact night-coverage [email or web address] Password [facility] 01/11/2020, 10:27 AM

## 2020-01-12 ENCOUNTER — Inpatient Hospital Stay (HOSPITAL_COMMUNITY): Payer: Medicare Other | Admitting: Certified Registered Nurse Anesthetist

## 2020-01-12 ENCOUNTER — Encounter (HOSPITAL_COMMUNITY): Payer: Self-pay | Admitting: Internal Medicine

## 2020-01-12 ENCOUNTER — Encounter (HOSPITAL_COMMUNITY)
Admission: EM | Disposition: A | Discharge status: Home Health | Payer: Self-pay | Source: Home / Self Care | Attending: Internal Medicine

## 2020-01-12 DIAGNOSIS — N189 Chronic kidney disease, unspecified: Secondary | ICD-10-CM

## 2020-01-12 DIAGNOSIS — Z8509 Personal history of malignant neoplasm of other digestive organs: Secondary | ICD-10-CM

## 2020-01-12 DIAGNOSIS — R05 Cough: Secondary | ICD-10-CM

## 2020-01-12 DIAGNOSIS — E039 Hypothyroidism, unspecified: Secondary | ICD-10-CM

## 2020-01-12 DIAGNOSIS — K3189 Other diseases of stomach and duodenum: Secondary | ICD-10-CM

## 2020-01-12 DIAGNOSIS — I129 Hypertensive chronic kidney disease with stage 1 through stage 4 chronic kidney disease, or unspecified chronic kidney disease: Secondary | ICD-10-CM

## 2020-01-12 HISTORY — PX: ESOPHAGOGASTRODUODENOSCOPY (EGD) WITH PROPOFOL: SHX5813

## 2020-01-12 HISTORY — PX: BIOPSY: SHX5522

## 2020-01-12 LAB — COMPREHENSIVE METABOLIC PANEL
ALT: 40 U/L (ref 0–44)
AST: 23 U/L (ref 15–41)
Albumin: 2.2 g/dL — ABNORMAL LOW (ref 3.5–5.0)
Alkaline Phosphatase: 95 U/L (ref 38–126)
Anion gap: 9 (ref 5–15)
BUN: 12 mg/dL (ref 8–23)
CO2: 22 mmol/L (ref 22–32)
Calcium: 8.8 mg/dL — ABNORMAL LOW (ref 8.9–10.3)
Chloride: 107 mmol/L (ref 98–111)
Creatinine, Ser: 1.02 mg/dL — ABNORMAL HIGH (ref 0.44–1.00)
GFR calc Af Amer: 56 mL/min — ABNORMAL LOW (ref >60)
GFR calc non Af Amer: 48 mL/min — ABNORMAL LOW (ref >60)
Glucose, Bld: 89 mg/dL (ref 70–99)
Potassium: 3.5 mmol/L (ref 3.5–5.1)
Sodium: 138 mmol/L (ref 135–145)
Total Bilirubin: 1 mg/dL (ref 0.3–1.2)
Total Protein: 5.3 g/dL — ABNORMAL LOW (ref 6.5–8.1)

## 2020-01-12 LAB — CBC
HCT: 28.6 % — ABNORMAL LOW (ref 36.0–46.0)
Hemoglobin: 8.9 g/dL — ABNORMAL LOW (ref 12.0–15.0)
MCH: 29 pg (ref 26.0–34.0)
MCHC: 31.1 g/dL (ref 30.0–36.0)
MCV: 93.2 fL (ref 80.0–100.0)
Platelets: 194 10*3/uL (ref 150–400)
RBC: 3.07 MIL/uL — ABNORMAL LOW (ref 3.87–5.11)
RDW: 17.7 % — ABNORMAL HIGH (ref 11.5–15.5)
WBC: 13.4 10*3/uL — ABNORMAL HIGH (ref 4.0–10.5)
nRBC: 0 % (ref 0.0–0.2)

## 2020-01-12 LAB — GLUCOSE, CAPILLARY
Glucose-Capillary: 89 mg/dL (ref 70–99)
Glucose-Capillary: 88 mg/dL (ref 70–99)
Glucose-Capillary: 88 mg/dL (ref 70–99)
Glucose-Capillary: 88 mg/dL (ref 70–99)
Glucose-Capillary: 150 mg/dL — ABNORMAL HIGH (ref 70–99)
Glucose-Capillary: 96 mg/dL (ref 70–99)

## 2020-01-12 LAB — MAGNESIUM: Magnesium: 1.9 mg/dL (ref 1.7–2.4)

## 2020-01-12 LAB — PHOSPHORUS: Phosphorus: 3 mg/dL (ref 2.5–4.6)

## 2020-01-12 LAB — LIPASE, BLOOD: Lipase: 28 U/L (ref 11–51)

## 2020-01-12 SURGERY — ESOPHAGOGASTRODUODENOSCOPY (EGD) WITH PROPOFOL
Anesthesia: Monitor Anesthesia Care

## 2020-01-12 MED ORDER — PROPOFOL 500 MG/50ML IV EMUL
INTRAVENOUS | Status: DC | PRN
  Administered 2020-01-12: 100 ug/kg/min via INTRAVENOUS

## 2020-01-12 MED ORDER — PROPOFOL 10 MG/ML IV BOLUS
INTRAVENOUS | Status: DC | PRN
  Administered 2020-01-12: 30 mg via INTRAVENOUS

## 2020-01-12 MED ORDER — GUAIFENESIN ER 600 MG PO TB12
600.0000 mg | ORAL_TABLET | Freq: Two times a day (BID) | ORAL | Status: DC
  Administered 2020-01-12 – 2020-01-16 (×9): 600 mg via ORAL
  Filled 2020-01-12 (×9): qty 1

## 2020-01-12 MED ORDER — FLUTICASONE PROPIONATE 50 MCG/ACT NA SUSP
2.0000 | Freq: Every day | NASAL | Status: DC
  Administered 2020-01-12 – 2020-01-16 (×5): 2 via NASAL
  Filled 2020-01-12: qty 16

## 2020-01-12 MED ORDER — LACTATED RINGERS IV SOLN: INTRAVENOUS | Status: DC | PRN

## 2020-01-12 MED ORDER — LIDOCAINE HCL (CARDIAC) PF 100 MG/5ML IV SOSY
PREFILLED_SYRINGE | INTRAVENOUS | Status: DC | PRN
  Administered 2020-01-12: 60 mg via INTRATRACHEAL

## 2020-01-12 MED ORDER — SODIUM CHLORIDE 0.9 % IV SOLN
510.0000 mg | Freq: Once | INTRAVENOUS | Status: AC
  Administered 2020-01-14: 510 mg via INTRAVENOUS
  Filled 2020-01-12: qty 17

## 2020-01-12 MED ORDER — METOPROLOL TARTRATE 5 MG/5ML IV SOLN
2.5000 mg | Freq: Once | INTRAVENOUS | Status: AC
  Administered 2020-01-12: 2.5 mg via INTRAVENOUS
  Filled 2020-01-12: qty 5

## 2020-01-12 MED ORDER — IMATINIB MESYLATE 100 MG PO TABS
400.0000 mg | ORAL_TABLET | Freq: Every day | ORAL | Status: DC
  Administered 2020-01-13 – 2020-01-16 (×4): 400 mg via ORAL
  Filled 2020-01-12 (×5): qty 4

## 2020-01-12 MED ORDER — LORATADINE 10 MG PO TABS
10.0000 mg | ORAL_TABLET | Freq: Every day | ORAL | Status: DC
  Administered 2020-01-12 – 2020-01-16 (×5): 10 mg via ORAL
  Filled 2020-01-12 (×5): qty 1

## 2020-01-12 MED ORDER — PHENYLEPHRINE HCL (PRESSORS) 10 MG/ML IV SOLN
INTRAVENOUS | Status: DC | PRN
  Administered 2020-01-12: 80 ug via INTRAVENOUS

## 2020-01-12 SURGICAL SUPPLY — 14 items

## 2020-01-12 NOTE — Anesthesia Procedure Notes (Signed)
Procedure Name: MAC Date/Time: 01/12/2020 10:56 AM Performed by: Kathryne Hitch, CRNA Pre-anesthesia Checklist: Patient identified, Suction available, Patient being monitored and Emergency Drugs available Patient Re-evaluated:Patient Re-evaluated prior to induction Oxygen Delivery Method: Nasal cannula Preoxygenation: Pre-oxygenation with 100% oxygen Induction Type: IV induction Dental Injury: Teeth and Oropharynx as per pre-operative assessment

## 2020-01-12 NOTE — Evaluation (Addendum)
Occupational Therapy Evaluation Patient Details Name: Monique Lin MRN: [id number] DOB: [birth year] Today's Date: 01/12/2020    History of Present Illness 86 y.o. female with history of GI stromal tumor status post resection in 2011 with history of hypertension hypothyroidism who was recently admitted for possible cardiac arrest requiring CPR for 1 minute at this time patient was found to be in anemia was transfused and discharged home started experiencing severe epigastric pain since yesterday. CT scan showing necrotic gastric tumor and also CBD stone with pancreatitis. 2/6 GI noted pt improved and likely passed the stone. To have EGD for biopsy of tumor 2/8   Clinical Impression   PTA patient reports needing some assist for ADLs, limited household mobility.  Admitted for above and limited by problem list below, including generalized weakness, decreased activity tolerance.  Patient session limited today by increased coughing, coughing up phlegm.  Patient assisted by raising HOB to 45*, pulling to long sitting with min assist but declining further mobility.  Completing ADLs with setup to total assist.  Planned EGD this morning, slightly anxious about this procedure.  She will benefit from continued OT services while admitted and after dc at [facility] level, as patient has 24/7 support, to optimize return to PLOF with ADLs, mobility.  Will follow acutely.     Follow Up Recommendations  Home health OT;Supervision/Assistance - 24 hour    Equipment Recommendations  3 in 1 bedside commode    Recommendations for Other Services       Precautions / Restrictions Precautions Precautions: Fall Restrictions Weight Bearing Restrictions: No      Mobility Bed Mobility Overal bed mobility: Needs Assistance             General bed mobility comments: pt declined bed mobility, repositioned HOB to 45* to improve ability to cough up phlegm, pt able to pull into long sitting in bed with min assist but  otherwise deferred further mobility   Transfers                 General transfer comment: deferred    Balance Overall balance assessment: Needs assistance Sitting-balance support: Single extremity supported Sitting balance-Leahy Scale: Good Sitting balance - Comments: long sitting in bed with supervision, at least 1 UE support                                    ADL either performed or assessed with clinical judgement   ADL Overall ADL's : Needs assistance/impaired     Grooming: Wash/dry hands;Wash/dry face;Set up;Bed level   Upper Body Bathing: Minimal assistance;Bed level   Lower Body Bathing: Bed level;Maximal assistance   Upper Body Dressing : Minimal assistance;Bed level   Lower Body Dressing: Total assistance;Bed level     Toilet Transfer Details (indicate cue type and reason): deferred          Functional mobility during ADLs: Minimal assistance(limited to long sitting in bed ) General ADL Comments: pt limited by weakness, decreased activity tolerance; noted with increased seretions and coughing up phlegm today      Vision         Perception     Praxis      Pertinent Vitals/Pain Pain Assessment: Faces Faces Pain Scale: Hurts a little bit Pain Location: generalized  Pain Descriptors / Indicators: Discomfort;Aching Pain Intervention(s): Limited activity within patient's tolerance;Monitored during session;Repositioned     Hand Dominance Right   Extremity/Trunk  Assessment Upper Extremity Assessment Upper Extremity Assessment: Generalized weakness   Lower Extremity Assessment Lower Extremity Assessment: Defer to PT evaluation       Communication Communication Communication: HOH   Cognition Arousal/Alertness: Awake/alert Behavior During Therapy: WFL for tasks assessed/performed Overall Cognitive Status: Within Functional Limits for tasks assessed                                     General Comments  HR  ranged from 100-120 with bed mobility     Exercises     Shoulder Instructions      Home Living Family/patient expects to be discharged to:: Private residence Living Arrangements: Children Available Help at Discharge: Family;Available 24 hours/day Type of Home: House Home Access: Stairs to enter [employer] of Steps: 2 Entrance Stairs-Rails: Right;Left;Can reach both Home Layout: One level     Bathroom Shower/Tub: Teacher, early years/pre: Standard     Home Equipment: Environmental consultant - 2 wheels;Cane - single point;Walker - 4 wheels;Bedside commode;Wheelchair - manual;Shower seat          Prior Functioning/Environment Level of Independence: Needs assistance  Gait / Transfers Assistance Needed: walks across bedroom "on good days" ; can walk from car to front steps when goes out ADL's / Homemaking Assistance Needed: patient reports some assist for ADLS, she "does what she can"             OT Problem List: Decreased strength;Decreased range of motion;Decreased activity tolerance;Impaired balance (sitting and/or standing);Decreased knowledge of use of DME or AE;Cardiopulmonary status limiting activity;Obesity      OT Treatment/Interventions: Self-care/ADL training;Therapeutic exercise;DME and/or AE instruction;Therapeutic activities;Patient/family education;Balance training    OT Goals(Current goals can be found in the care plan section) Acute Rehab OT Goals Patient Stated Goal: to feel better  OT Goal Formulation: With patient Time For Goal Achievement: 01/26/20 Potential to Achieve Goals: Good  OT Frequency: Min 2X/week   Barriers to D/C:            Co-evaluation              AM-PAC OT "6 Clicks" Daily Activity     Outcome Measure Help from another person eating meals?: A Little Help from another person taking care of personal grooming?: A Little Help from another person toileting, which includes using toliet, bedpan, or urinal?: Total Help  from another person bathing (including washing, rinsing, drying)?: A Lot Help from another person to put on and taking off regular upper body clothing?: A Little Help from another person to put on and taking off regular lower body clothing?: Total 6 Click Score: 13   End of Session Nurse Communication: Mobility status  Activity Tolerance: Treatment limited secondary to medical complications (Comment);Patient limited by lethargy("coughing up phlem" ) Patient left: in bed;with call bell/phone within reach;with bed alarm set  OT Visit Diagnosis: Muscle weakness (generalized) (M62.81);Other abnormalities of gait and mobility (R26.89)                Time: 4818-5631 OT Time Calculation (min): 20 min Charges:  OT General Charges $OT Visit: 1 Visit OT Evaluation $OT Eval Moderate Complexity: 1 Mod  Jolaine Artist, OT Acute Rehabilitation Services Pager [phone] Office [phone]    Delight Stare 01/12/2020, 8:54 AM

## 2020-01-12 NOTE — Progress Notes (Signed)
   01/12/20 0900  Clinical Encounter Type  Visited With Health care provider;Patient not available  Visit Type Follow-up  Referral From Nurse  Consult/Referral To [facility] responded to consult for AD. Clyda is on her way to endoscopy and will need post anesthesia recovery time today. Chaplain will check back for AD on Tuesday February 9th, 2021. Chaplain remains available for support as needs arise.   Chaplain Resident, Evelene Croon, M Div [phone] on-call pager

## 2020-01-12 NOTE — Progress Notes (Addendum)
   Subjective/Chief Complaint: No ab pain, awaiting egd   Objective: Vital signs in last 24 hours: Temp:  [98.5 F (36.9 C)-99.7 F (37.6 C)] 98.7 F (37.1 C) (02/08 0745) Pulse Rate:  [97-101] 101 (02/08 0745) Resp:  [16-20] 20 (02/08 0745) BP: (137-155)/(55-76) 155/76 (02/08 0745) SpO2:  [97 %-98 %] 98 % (02/08 0745) Last BM Date: 01/11/20  Intake/Output from previous day: 02/07 0701 - 02/08 0700 In: 1026.6 [P.O.:240; I.V.:40; IV Piggyback:746.6] Out: -  Intake/Output this shift: No intake/output data recorded.  GI: soft only mild tender in luq bs present  Lab Results:  Recent Labs    01/11/20 0543 01/12/20 0316  WBC 12.9* 13.4*  HGB 8.7* 8.9*  HCT 27.4* 28.6*  PLT 165 194   BMET Recent Labs    01/10/20 0300 01/12/20 0316  NA 141 138  K 4.0 3.5  CL 110 107  CO2 21* 22  GLUCOSE 86 89  BUN 9 12  CREATININE 1.10* 1.02*  CALCIUM 8.6* 8.8*   PT/INR Recent Labs    01/09/20 1501  LABPROT 14.7  INR 1.2   ABG No results for input(s): PHART, HCO3 in the last 72 hours.  Invalid input(s): PCO2, PO2  Studies/Results: No results found.  Anti-infectives: Anti-infectives (From admission, onward)   Start     Dose/Rate Route Frequency Ordered Stop   01/10/20 1400  cefTRIAXone (ROCEPHIN) 2 g in sodium chloride 0.9 % 100 mL IVPB     2 g 200 mL/hr over 30 Minutes Intravenous Every 24 hours 01/10/20 1213     01/09/20 1830  ciprofloxacin (CIPRO) IVPB 400 mg  Status:  Discontinued     400 mg 200 mL/hr over 60 Minutes Intravenous Every 12 hours 01/09/20 0853 01/10/20 1213   01/09/20 1400  metroNIDAZOLE (FLAGYL) IVPB 500 mg     500 mg 100 mL/hr over 60 Minutes Intravenous Every 8 hours 01/09/20 0611     01/09/20 0630  ciprofloxacin (CIPRO) IVPB 400 mg     400 mg 200 mL/hr over 60 Minutes Intravenous  Once 01/09/20 0616 01/09/20 0909   01/09/20 0445  cefTRIAXone (ROCEPHIN) 1 g in sodium chloride 0.9 % 100 mL IVPB     1 g 200 mL/hr over 30 Minutes Intravenous   Once 01/09/20 0432 01/09/20 0516   01/09/20 0445  metroNIDAZOLE (FLAGYL) IVPB 500 mg     500 mg 100 mL/hr over 60 Minutes Intravenous  Once 01/09/20 0432 01/09/20 2774      Assessment/Plan: GSP- resolved now clinically and chemically, question is what to do long term in 86 yo with recurrent unresectable gist. Options are just follow her, surgery or ercp with sphincterotomy which might be most reasonable option.   Will await the egd today Recurrent GIST- not resectable, await egd, may need oncology involvement if gleevec is option Agree with Dr Clelia Schaumann note about palliative and goals of care     Rolm Bookbinder 01/12/2020

## 2020-01-12 NOTE — Interval H&P Note (Signed)
History and Physical Interval Note:  01/12/2020 10:50 AM  Monique Lin  has presented today for surgery, with the diagnosis of Gastric mass; Abdominal pain.  The various methods of treatment have been discussed with the patient. After consideration of risks, benefits and other options for treatment, the patient has consented to  Procedure(s): ESOPHAGOGASTRODUODENOSCOPY (EGD) WITH PROPOFOL (N/A) as a surgical intervention.  The patient's history has been reviewed, patient examined, no change in status, stable for surgery.  I have reviewed the patient's chart and labs.  Questions were answered to the patient's satisfaction.     Youlanda Mighty Ashten Prats

## 2020-01-12 NOTE — Op Note (Signed)
[facility] Patient Name: Monique Lin Procedure Date : 01/12/2020 MRN: [id number] Attending MD: Ronald Lobo , MD Date of Birth: 07-04-1927 CSN: [id number] Age: 86 Admit Type: Inpatient Procedure:                Upper GI endoscopy Indications:              Abnormal CT of the GI tract, --recent gallstone                            pancreatitis, with radiographic evidence of                            recurrent GIST tumor involving posterior stomach Providers:                Ronald Lobo, MD, Haze Boyden, CRNA,                            Jeanella Cara, RN, William Dalton,                            Technician Referring MD:              Medicines:                Monitored Anesthesia Care Complications:            No immediate complications. Estimated Blood Loss:     Estimated blood loss was minimal. Procedure:                Pre-Anesthesia Assessment:                           - Prior to the procedure, a History and Physical                            was performed, and patient medications and                            allergies were reviewed. The patient's tolerance of                            previous anesthesia was also reviewed. The risks                            and benefits of the procedure and the sedation                            options and risks were discussed with the patient.                            All questions were answered, and informed consent                            was obtained. Prior Anticoagulants: The patient has                            taken  no previous anticoagulant or antiplatelet                            agents. ASA Grade Assessment: III - A patient with                            severe systemic disease. After reviewing the risks                            and benefits, the patient was deemed in                            satisfactory condition to undergo the procedure.                           After obtaining  informed consent, the endoscope was                            passed under direct vision. Throughout the                            procedure, the patient's blood pressure, pulse, and                            oxygen saturations were monitored continuously. The                            GIF-H190 ([id number] Olympus gastroscope was                            introduced through the mouth, and advanced to the                            second part of duodenum. The upper GI endoscopy was                            accomplished without difficulty. The patient                            tolerated the procedure well. Scope In: Scope Out: Findings:      The larynx was normal.      The examined esophagus was normal.      A large, submucosal mass with no bleeding and no stigmata of recent       bleeding was found in the gastric fundus. The mucosa overlying this       lesion was basically intact, apart from a linear, erythematous erosion       in its mid portion. Biopsies were taken with a cold forceps for       histology. Estimated blood loss was minimal.      The exam of the stomach was otherwise normal.      The cardia and gastric fundus were normal on retroflexion apart from the       above-mentioned mass, which protruded into the fundic lumen.      The examined duodenum was normal.  Impression:               - Normal larynx.                           - Normal esophagus.                           - Submucosal tumor in the gastric fundus with                            minimal erosive change. Biopsied.                           - Normal examined duodenum. Recommendation:           - Await pathology results. Procedure Code(s):        --- Professional ---                           [phone], Esophagogastroduodenoscopy, flexible,                            transoral; with biopsy, single or multiple Diagnosis Code(s):        --- Professional ---                           D49.0, Neoplasm of  unspecified behavior of                            digestive system                           R93.3, Abnormal findings on diagnostic imaging of                            other parts of digestive tract CPT copyright 2019 [employer]. All rights reserved. The codes documented in this report are preliminary and upon coder review may  be revised to meet current compliance requirements. Ronald Lobo, MD 01/12/2020 11:55:05 AM This report has been signed electronically. Number of Addenda: 0

## 2020-01-12 NOTE — Consult Note (Addendum)
[place]  [phone] Fax:([phone]   [place]  Referral MD: Dr. Wendee Beavers  Reason for Referral: gastric mass, history of GIST s/p resection in 2011  HPI: Ms. Toohey is a 86 year old female with past medical history significant for GIST status post resection in 2011, hypertension, hypothyroidism, anemia, and recent arrest ROSC after 1 minute of CPR on 12/11/2019.  The patient presented to the ER with abdominal pain.  Labs on admission showed a hemoglobin 9.5, creatinine 1.17, albumin 3.2, AST 405, ALT 140, alkaline phosphatase 145, total bilirubin 1.3, lipase 2582.  A CT of the abdomen pelvis with contrast performed on 01/09/2020 showed a large centrally necrotic mass which appears to arise from the posterior aspect of the stomach with envelopment of the splenic vein as well as the body and tail of the pancreas, cholelithiasis with distal common bile duct stone causing mild biliary ductal dilatation and focal pancreatitis involving the head of the uncinate process.  The patient was seen by general surgery and the mass was determined not to be resectable.  She was also seen by gastroenterology and underwent an EGD earlier today.  The esophagus was normal.  There was a large submucosal mass with no bleeding and no stigmata of recent bleeding found in the gastric fundus.  Biopsies were taken.  When seen today, the patient reports that her abdominal pain has improved compared to prior to admission. She denies fevers and chills. Reports anorexia but states that she has never been a big eater. Denies weight loss. Denies headaches and vision changes. Denies chest pain. Report some shortness of breath and a chronic cough.  Denies nausea, vomiting, constipation, diarrhea.  She has not noticed any lower extremity edema.  Denies epistaxis, hemoptysis, hematemesis, hematuria, melena, hematochezia.  The patient is widowed.  She lives with her daughter.  She  has 4 children-2 daughters and 2 sons.  Her 2 sons are deceased.  Denies history of alcohol or tobacco use.  Medical oncology to see the patient to make recommendations regarding her gastric mass and history of GIST.    Past Medical History:  Diagnosis Date  . Anemia   . Bronchitis   . Hypertension   . Hypothyroidism   . Leg pain   . Stomach tumor (benign)   :  Past Surgical History:  Procedure Laterality Date  . IR CATHETER TUBE CHANGE  04/14/2018  . IR THORACENTESIS ASP PLEURAL SPACE W/IMG GUIDE  04/05/2018  . NASAL SINUS SURGERY    . stomach tumor removal    :  Current Facility-Administered Medications  Medication Dose Route Frequency Provider Last Rate Last Admin  . 0.9 %  sodium chloride infusion   Intravenous Continuous Wilford Corner, MD 20 mL/hr at 01/12/20 0511 New Bag at 01/12/20 0511  . [MAR Hold] acetaminophen (TYLENOL) tablet 650 mg  650 mg Oral Q6H PRN Rise Patience, MD       Or  . Doug Sou Hold] acetaminophen (TYLENOL) suppository 650 mg  650 mg Rectal Q6H PRN Rise Patience, MD      . Doug Sou Hold] alum & mag hydroxide-simeth (MAALOX/MYLANTA) 200-200-20 MG/5ML suspension 30 mL  30 mL Oral Q6H PRN Wendee Beavers T, MD   30 mL at 01/10/20 1902  . [MAR Hold] carvedilol (COREG) tablet 3.125 mg  3.125 mg Oral BID WC Gonfa, Taye T, MD   3.125 mg at 01/11/20 1459  . [MAR Hold] cefTRIAXone (ROCEPHIN) 2 g in sodium chloride 0.9 %  100 mL IVPB  2 g Intravenous Q24H Wendee Beavers T, MD 200 mL/hr at 01/11/20 1611 2 g at 01/11/20 1611  . [MAR Hold] enoxaparin (LOVENOX) injection 40 mg  40 mg Subcutaneous Q24H Gonfa, Taye T, MD   40 mg at 01/11/20 1149  . [MAR Hold] fluticasone (FLONASE) 50 MCG/ACT nasal spray 2 spray  2 spray Each Nare Daily Mercy Riding, MD   Stopped at 01/12/20 0913  . [MAR Hold] ipratropium-albuterol (DUONEB) 0.5-2.5 (3) MG/3ML nebulizer solution 3 mL  3 mL Nebulization Q4H PRN Bodenheimer, Charles A, NP   3 mL at 01/11/20 1604  . [MAR Hold] labetalol  (NORMODYNE) injection 10 mg  10 mg Intravenous Q2H PRN Rise Patience, MD      . Doug Sou Hold] levothyroxine (SYNTHROID) tablet 50 mcg  50 mcg Oral Q0600 Mercy Riding, MD   50 mcg at 01/11/20 1149  . [MAR Hold] loratadine (CLARITIN) tablet 10 mg  10 mg Oral Daily Mercy Riding, MD   Stopped at 01/12/20 0914  . [MAR Hold] metroNIDAZOLE (FLAGYL) IVPB 500 mg  500 mg Intravenous Q8H Rise Patience, MD 100 mL/hr at 01/12/20 0629 500 mg at 01/12/20 0629  . [MAR Hold] morphine 2 MG/ML injection 1 mg  1 mg Intravenous Q2H PRN Wendee Beavers T, MD   1 mg at 01/12/20 0257  . [MAR Hold] ondansetron (ZOFRAN) tablet 4 mg  4 mg Oral Q6H PRN Rise Patience, MD       Or  . Doug Sou Hold] ondansetron [facility]) injection 4 mg  4 mg Intravenous Q6H PRN Rise Patience, MD      . Doug Sou Hold] oxymetazoline (AFRIN) 0.05 % nasal spray 1 spray  1 spray Each Nare BID PRN Bodenheimer, Charles A, NP      . Doug Sou Hold] pantoprazole (PROTONIX) injection 40 mg  40 mg Intravenous Q24H Wendee Beavers T, MD   40 mg at 01/11/20 1656     Allergies  Allergen Reactions  . Codeine Other (See Comments)    Stomach cramps  . Penicillins Other (See Comments)    "Bad stomach cramps" Has patient had a PCN reaction causing immediate rash, facial/tongue/throat swelling, SOB or lightheadedness with hypotension: Unk Has patient had a PCN reaction causing severe rash involving mucus membranes or skin necrosis: Unk Has patient had a PCN reaction that required hospitalization: Unk Has patient had a PCN reaction occurring within the last 10 years: No If all of the above answers are "NO", then may proceed with Cephalosporin use.    :  Family History  Family history unknown: Yes  :  Social History   Socioeconomic History  . Marital status: Widowed    Spouse name: Not on file  . Number of children: Not on file  . Years of education: Not on file  . Highest education level: Not on file  Occupational History  . Not on file   Tobacco Use  . Smoking status: Never Smoker  . Smokeless tobacco: Never Used  Substance and Sexual Activity  . Alcohol use: No  . Drug use: No  . Sexual activity: Not Currently  Other Topics Concern  . Not on file  Social History Narrative  . Not on file   Social Determinants of Health   Financial Resource Strain:   . Difficulty of Paying Living Expenses: Not on file  Food Insecurity:   . Worried About Charity fundraiser in the Last Year: Not on file  . Ran  Out of Food in the Last Year: Not on file  Transportation Needs:   . Lack of Transportation (Medical): Not on file  . Lack of Transportation (Non-Medical): Not on file  Physical Activity:   . Days of Exercise per Week: Not on file  . Minutes of Exercise per Session: Not on file  Stress:   . Feeling of Stress : Not on file  Social Connections:   . Frequency of Communication with Friends and Family: Not on file  . Frequency of Social Gatherings with Friends and Family: Not on file  . Attends Religious Services: Not on file  . Active Member of Clubs or Organizations: Not on file  . Attends Archivist Meetings: Not on file  . Marital Status: Not on file  Intimate Partner Violence:   . Fear of Current or Ex-Partner: Not on file  . Emotionally Abused: Not on file  . Physically Abused: Not on file  . Sexually Abused: Not on file  :  Review of Systems: A comprehensive 14 point review of systems was negative except as noted in the HPI.  Exam: Patient Vitals for the past 24 hrs:  BP Temp Temp src Pulse Resp SpO2  01/12/20 0952 (!) 177/72 99.8 F (37.7 C) Oral 96 15 96 %  01/12/20 0745 (!) 155/76 98.7 F (37.1 C) - (!) 101 20 98 %  01/11/20 2010 (!) 138/55 98.5 F (36.9 C) Oral 97 16 97 %  01/11/20 1805 (!) 137/56 99.7 F (37.6 C) - 97 18 97 %    General:  Elderly female, no distress.   Eyes:  no scleral icterus.   ENT:  There were no oropharyngeal lesions.   Lymphatics:  Negative cervical,  supraclavicular or axillary adenopathy.   Respiratory: lungs were clear bilaterally without wheezing or crackles.   Cardiovascular:  Regular rate and rhythm, S1/S2, without murmur, rub or gallop.  There was no pedal edema.   GI: Positive bowel sounds, soft, no tenderness with light palpation.  Small umbilical hernia noted. Musculoskeletal: Moves all extremities x4 Skin exam was without echymosis, petichae.   Neuro exam was nonfocal. Patient was alert and oriented.  Attention was good.   Language was appropriate.  Mood was normal without depression.  Speech was not pressured.  Thought content was not tangential.     Lab Results  Component Value Date   WBC 13.4 (H) 01/12/2020   HGB 8.9 (L) 01/12/2020   HCT 28.6 (L) 01/12/2020   PLT 194 01/12/2020   GLUCOSE 89 01/12/2020   ALT 40 01/12/2020   AST 23 01/12/2020   NA 138 01/12/2020   K 3.5 01/12/2020   CL 107 01/12/2020   CREATININE 1.02 (H) 01/12/2020   BUN 12 01/12/2020   CO2 22 01/12/2020    CXR PA and lateral  Result Date: 12/18/2019 CLINICAL DATA:  Follow-up pneumonia EXAM: CHEST - 2 VIEW COMPARISON:  12/16/2019, 12/11/2019 FINDINGS: Small bilateral pleural effusions which do not appear significantly changed. Persistent left greater than right basilar consolidations. Enlarged cardiomediastinal silhouette with aortic atherosclerosis. No pneumothorax. IMPRESSION: Similar small left greater than right pleural effusions with basilar atelectasis or pneumonia. Mild cardiomegaly. Electronically Signed   By: Donavan Foil M.D.   On: 12/18/2019 19:06   CXR PA and lateral  Result Date: 12/16/2019 CLINICAL DATA:  Pneumonia. Shortness of breath. EXAM: CHEST - 2 VIEW COMPARISON:  12/11/2019 FINDINGS: Cardiomegaly remains stable. New small left pleural effusion is seen with increased atelectasis or infiltrate in the left  retrocardiac lung base. Tiny right pleural effusion also noted, with atelectasis or infiltrate in the right cardiophrenic angle.  Aortic atherosclerosis incidentally noted. IMPRESSION: New bilateral pleural effusions and bibasilar atelectasis versus infiltrates, left side greater than right. Electronically Signed   By: Marlaine Hind M.D.   On: 12/16/2019 07:58   CT ABDOMEN PELVIS W CONTRAST  Result Date: 01/09/2020 CLINICAL DATA:  Abdominal pain EXAM: CT ABDOMEN AND PELVIS WITH CONTRAST TECHNIQUE: Multidetector CT imaging of the abdomen and pelvis was performed using the standard protocol following bolus administration of intravenous contrast. CONTRAST:  193m OMNIPAQUE IOHEXOL 300 MG/ML  SOLN COMPARISON:  09/20/2010 FINDINGS: Lower chest: Chronic consolidation in the lower lobes is noted left greater than right with changes of bronchiectasis and mucous plugging. Hepatobiliary: Gallbladder is well distended. Multiple gallstones are seen. Biliary ductal dilatation is noted secondary to a distal common bile duct stone best seen on image number 31 of series 3 and image number 43 of series 6. Pancreas: Pancreas demonstrates some peripancreatic inflammatory change near the head and uncinate process likely related to the distal common bile duct stone and focal pancreatitis. The body and tail are not well visualized due to a large enhancing mass lesion with central necrosis as described below. Spleen: Spleen appears within normal limits. Adrenals/Urinary Tract: Adrenal glands appear within normal limits. The kidneys demonstrate a normal enhancement pattern. Renal cystic change is noted. Normal excretion of contrast material is seen on delayed images. The bladder is decompressed. Stomach/Bowel: Scattered diverticular change of the colon is noted without evidence of diverticulitis. The colon is within normal limits without obstructive change. The appendix is not well seen and may have been surgically removed. No small bowel obstructive changes are seen. Stomach is decompressed. Along the posterior aspect of the stomach there is a large 11.0 x 10.8 cm  peripherally enhancing lesion with central necrosis. This envelops the splenic vein and appears to arise from the posterior aspect of the stomach. Distortion of previously seen surgical clips are noted. The body and tail of the pancreas are enveloped by this lesion is well. Vascular/Lymphatic: Aortic atherosclerosis. No enlarged abdominal or pelvic lymph nodes. Reproductive: Uterus and bilateral adnexa are unremarkable. Other: Small fat containing umbilical hernia is noted. No ascites is seen. Musculoskeletal: Degenerative changes of lumbar spine are noted. IMPRESSION: Large centrally necrotic mass lesion which appears to arise from the posterior aspect of the stomach within envelopment of the splenic vein as well as the body and tail of the pancreas. Patient has a history of prior gastrointestinal stromal tumor removal in 2011 and this likely represents local recurrence in the posterior stomach wall. Cholelithiasis with evidence of a distal common bile duct stone causing mild biliary ductal dilatation and focal pancreatitis involving the head and uncinate process. Chronic consolidation in the lower lobes bilaterally with mucous plugging and bronchiectasis. Electronically Signed   By: MInez CatalinaM.D.   On: 01/09/2020 03:35     CXR PA and lateral  Result Date: 12/18/2019 CLINICAL DATA:  Follow-up pneumonia EXAM: CHEST - 2 VIEW COMPARISON:  12/16/2019, 12/11/2019 FINDINGS: Small bilateral pleural effusions which do not appear significantly changed. Persistent left greater than right basilar consolidations. Enlarged cardiomediastinal silhouette with aortic atherosclerosis. No pneumothorax. IMPRESSION: Similar small left greater than right pleural effusions with basilar atelectasis or pneumonia. Mild cardiomegaly. Electronically Signed   By: KDonavan FoilM.D.   On: 12/18/2019 19:06   CXR PA and lateral  Result Date: 12/16/2019 CLINICAL DATA:  Pneumonia. Shortness of breath.  EXAM: CHEST - 2 VIEW COMPARISON:   12/11/2019 FINDINGS: Cardiomegaly remains stable. New small left pleural effusion is seen with increased atelectasis or infiltrate in the left retrocardiac lung base. Tiny right pleural effusion also noted, with atelectasis or infiltrate in the right cardiophrenic angle. Aortic atherosclerosis incidentally noted. IMPRESSION: New bilateral pleural effusions and bibasilar atelectasis versus infiltrates, left side greater than right. Electronically Signed   By: Marlaine Hind M.D.   On: 12/16/2019 07:58   CT ABDOMEN PELVIS W CONTRAST  Result Date: 01/09/2020 CLINICAL DATA:  Abdominal pain EXAM: CT ABDOMEN AND PELVIS WITH CONTRAST TECHNIQUE: Multidetector CT imaging of the abdomen and pelvis was performed using the standard protocol following bolus administration of intravenous contrast. CONTRAST:  132m OMNIPAQUE IOHEXOL 300 MG/ML  SOLN COMPARISON:  09/20/2010 FINDINGS: Lower chest: Chronic consolidation in the lower lobes is noted left greater than right with changes of bronchiectasis and mucous plugging. Hepatobiliary: Gallbladder is well distended. Multiple gallstones are seen. Biliary ductal dilatation is noted secondary to a distal common bile duct stone best seen on image number 31 of series 3 and image number 43 of series 6. Pancreas: Pancreas demonstrates some peripancreatic inflammatory change near the head and uncinate process likely related to the distal common bile duct stone and focal pancreatitis. The body and tail are not well visualized due to a large enhancing mass lesion with central necrosis as described below. Spleen: Spleen appears within normal limits. Adrenals/Urinary Tract: Adrenal glands appear within normal limits. The kidneys demonstrate a normal enhancement pattern. Renal cystic change is noted. Normal excretion of contrast material is seen on delayed images. The bladder is decompressed. Stomach/Bowel: Scattered diverticular change of the colon is noted without evidence of diverticulitis.  The colon is within normal limits without obstructive change. The appendix is not well seen and may have been surgically removed. No small bowel obstructive changes are seen. Stomach is decompressed. Along the posterior aspect of the stomach there is a large 11.0 x 10.8 cm peripherally enhancing lesion with central necrosis. This envelops the splenic vein and appears to arise from the posterior aspect of the stomach. Distortion of previously seen surgical clips are noted. The body and tail of the pancreas are enveloped by this lesion is well. Vascular/Lymphatic: Aortic atherosclerosis. No enlarged abdominal or pelvic lymph nodes. Reproductive: Uterus and bilateral adnexa are unremarkable. Other: Small fat containing umbilical hernia is noted. No ascites is seen. Musculoskeletal: Degenerative changes of lumbar spine are noted. IMPRESSION: Large centrally necrotic mass lesion which appears to arise from the posterior aspect of the stomach within envelopment of the splenic vein as well as the body and tail of the pancreas. Patient has a history of prior gastrointestinal stromal tumor removal in 2011 and this likely represents local recurrence in the posterior stomach wall. Cholelithiasis with evidence of a distal common bile duct stone causing mild biliary ductal dilatation and focal pancreatitis involving the head and uncinate process. Chronic consolidation in the lower lobes bilaterally with mucous plugging and bronchiectasis. Electronically Signed   By: MInez CatalinaM.D.   On: 01/09/2020 03:35     Pathology:  REPORT OF SURGICAL PATHOLOGY   Accession #: [id number]  Patient Name: KAYDE, RECORD Visit # : [id number]  MRN: [id number] Physician: BStark Klein DOB/Age 96Oct 12, 1928(Age: 89 Gender: F  Collected Date: 09/21/2010  Received Date: 09/21/2010   FINAL DIAGNOSIS   1. Stomach, resection for tumor, partial :  GASTROINTESTINAL STROMAL TUMOR, 3.2 CM.   DATE SIGNED OUT:  09/23/2010   ELECTRONIC SIGNATURE : Early Osmond, John, Pathologist, Electronic Signature   Assessment and Plan:  1.  Large centrally necrotic mass in the stomach concerning for recurrent GIST; patient has a history of GIST and is status post resection in 2011 2.  Acute calculus pancreatitis 3.  Anemia 4.  Leukocytosis 5.  CKD 6.  Hypertension 7.  Hypothyroidism  -The patient has unresectable necrotic mass in the stomach starting for recurrent GIST.  She underwent upper endoscopy earlier today and biopsies are pending.  We will await the pathology results.  We may be able to consider treatment with Gleevec if pathology is results confirm recurrent GIST.  We will also await goals of care discussion with the palliative care team. -Pancreatitis seems to be resolving.  She will be started on a soft diet today. -The patient has stable anemia likely due to underlying malignancy and iron deficiency.  Ferritin is normal however iron level, TIBC, and percent saturation are all low.  Status post Feraheme 510 mg on 01/11/2020.  Would recommend repeating in 1 week. -White blood cell count is trending upward.  She remains afebrile.  This is likely due to her pancreatitis.  Remains on antibiotics. -Renal function appears to be slowly improving.  Recommend close monitoring.  Thank you for this referral.   Mikey Bussing, DNP, AGPCNP-BC, AOCNP  ADDENDUM: I saw and examined Ms. Wilinski.  She is very nice.  She had her upper endoscopy today.  I note the report from Dr. Cristina Gong.  The path report back in 2011 shows a low-grade gastrointestinal stromal tumor.  She did not have any treatment afterwards.  Again I have to believe that this GIST has come back.  We will see what the pathology shows.  I am sure that it will be c-kit positive.  I would go ahead and put Ms. Cordoba on [facility] thinking that she is having a recurrence of her stromal tumor.  I would get an echocardiogram on her just to make sure that her cardiac function  is okay.  An echocardiogram that was done back in May 2019 shows an ejection fraction of 55-60%.  I had believe that she is having some bleeding.  Her iron saturation is only 7%.  Her hemoglobin today was 8.9.  MCV is 93.  I suspect she is going need IV iron.  I also would probably get her on some folic acid.  Again, I have to believe that this is going to be recurrence of her GIST.  We will see what the pathology report shows.  I would go ahead and get her on [place].  We will see how she tolerates Gleevec as an inpatient.  I believe that [place] should work since she has never had [place] before.  I spent about an hour with her today.  She is very very nice.  We had an excellent prayer session.  She has a very strong faith.`  Lattie Haw, MD  Psalm 139:14

## 2020-01-12 NOTE — Progress Notes (Signed)
Patient's endoscopy was well-tolerated.  It shows a moderately large submucosal mass in the fundic region.  Fortunately, there was no significant mucosal ulceration, just some linear erosive change.  No blood in the stomach.  Biopsies obtained.  Findings reviewed with patient's daughter by telephone.  Recommendations:  1.  Await pathology results 2.  I believe the patient is sufficiently over her gallstone pancreatitis that she can be tried on a soft diet, which I have ordered  Cleotis Nipper, M.D. Pager [phone] If no answer or after 5 PM call [phone]

## 2020-01-12 NOTE — Transfer of Care (Signed)
Immediate Anesthesia Transfer of Care Note  Patient: Monique Lin  Procedure(s) Performed: ESOPHAGOGASTRODUODENOSCOPY (EGD) WITH PROPOFOL (N/A ) BIOPSY  Patient Location: Endoscopy Unit  Anesthesia Type:MAC  Level of Consciousness: drowsy and patient cooperative  Airway & Oxygen Therapy: Patient Spontanous Breathing and Patient connected to nasal cannula oxygen  Post-op Assessment: Report given to RN and Post -op Vital signs reviewed and stable  Post vital signs: Reviewed and stable  Last Vitals:  Vitals Value Taken Time  BP    Temp    Pulse 86 01/12/20 1128  Resp 21 01/12/20 1128  SpO2 92 % 01/12/20 1128  Vitals shown include unvalidated device data.  Last Pain:  Vitals:   01/12/20 0952  TempSrc: Oral  PainSc: 2       Patients Stated Pain Goal: 0 (82/51/89 8421)  Complications: No apparent anesthesia complications

## 2020-01-12 NOTE — Anesthesia Preprocedure Evaluation (Signed)
Anesthesia Evaluation  Patient identified by MRN, date of birth, ID band Patient awake    Reviewed: Allergy & Precautions, NPO status , Patient's Chart, lab work & pertinent test results  Airway Mallampati: II  TM Distance: >3 FB Neck ROM: Full    Dental no notable dental hx.    Pulmonary pneumonia, unresolved,    breath sounds clear to auscultation + decreased breath sounds      Cardiovascular hypertension, Normal cardiovascular exam Rhythm:Regular Rate:Normal     Neuro/Psych negative neurological ROS  negative psych ROS   GI/Hepatic negative GI ROS, Neg liver ROS,   Endo/Other  Hypothyroidism   Renal/GU negative Renal ROS  negative genitourinary   Musculoskeletal negative musculoskeletal ROS (+)   Abdominal   Peds negative pediatric ROS (+)  Hematology  (+) anemia ,   Anesthesia Other Findings   Reproductive/Obstetrics negative OB ROS                             Anesthesia Physical Anesthesia Plan  ASA: III  Anesthesia Plan: MAC   Post-op Pain Management:    Induction: Intravenous  PONV Risk Score and Plan: 0  Airway Management Planned: Nasal Cannula  Additional Equipment:   Intra-op Plan:   Post-operative Plan:   Informed Consent: I have reviewed the patients History and Physical, chart, labs and discussed the procedure including the risks, benefits and alternatives for the proposed anesthesia with the patient or authorized representative who has indicated his/her understanding and acceptance.     Dental advisory given  Plan Discussed with: CRNA and Surgeon  Anesthesia Plan Comments:         Anesthesia Quick Evaluation

## 2020-01-12 NOTE — Plan of Care (Signed)
  Problem: Education: Goal: Knowledge of General Education information will improve Description: Including pain rating scale, medication(s)/side effects and non-pharmacologic comfort measures Outcome: Progressing   Problem: Health Behavior/Discharge Planning: Goal: Ability to manage health-related needs will improve Outcome: Progressing   Problem: Clinical Measurements: Goal: Ability to maintain clinical measurements within normal limits will improve Outcome: Progressing Goal: Will remain free from infection Outcome: Progressing Goal: Diagnostic test results will improve Outcome: Progressing Goal: Respiratory complications will improve Outcome: Progressing Goal: Cardiovascular complication will be avoided Outcome: Progressing   Problem: Activity: Goal: Risk for activity intolerance will decrease Outcome: Progressing   Problem: Nutrition: Goal: Adequate nutrition will be maintained Outcome: Progressing   Problem: Coping: Goal: Level of anxiety will decrease Outcome: Progressing   Problem: Elimination: Goal: Will not experience complications related to bowel motility Outcome: Progressing Goal: Will not experience complications related to urinary retention Outcome: Progressing   Problem: Pain Managment: Goal: General experience of comfort will improve Outcome: Progressing   Problem: Safety: Goal: Ability to remain free from injury will improve Outcome: Progressing   Problem: Skin Integrity: Goal: Risk for impaired skin integrity will decrease Outcome: Progressing   Problem: Bowel/Gastric: Goal: Will show no signs and symptoms of gastrointestinal bleeding Outcome: Progressing   Problem: Clinical Measurements: Goal: Complications related to the disease process, condition or treatment will be avoided or minimized Outcome: Progressing   Problem: Nutritional: Goal: Ability to achieve adequate nutritional intake will improve Outcome: Progressing

## 2020-01-12 NOTE — Progress Notes (Signed)
PROGRESS NOTE  Monique Lin GMW:[id number] DOB: June 04, 1927   PCP: [name], MD  Patient is from: Home.  Uses walker and Rollator at baseline.  DOA: 01/09/2020 LOS: 3  Brief Narrative / Interim history: 86 year old female with history of GI stromal tumor s/p resection in 2011, HTN,  Hypothyroidism, anemia and recent "cardiac arrest  ROSC after 1 minute of CPR" presenting with abdominal pain and admitted for acute gallstone pancreatitis and necrotic gastric mass as noted on CT abdomen and pelvis.  Lipase elevated to 2500.  AST 405.  ALT 140.  Total bili 1.3.   The next day, GI and general surgery consulted.  Pancreatitis and transaminitis resolved.  Plan is for EGD.   Subjective: No major events overnight or this morning.  Complains cough due to postnasal drip.  Feels cold.  Denies fever, chest pain, shortness of breath, nausea, vomiting, diarrhea or UTI symptoms.  Anxious about the procedure. Objective: Vitals:   01/11/20 0805 01/11/20 1805 01/11/20 2010 01/12/20 0745  BP: (!) 141/56 (!) 137/56 (!) 138/55 (!) 155/76  Pulse: 99 97 97 (!) 101  Resp: 19 18 16 20   Temp: 99.8 F (37.7 C) 99.7 F (37.6 C) 98.5 F (36.9 C) 98.7 F (37.1 C)  TempSrc:   Oral   SpO2: 97% 97% 97% 98%  Weight:      Height:        Intake/Output Summary (Last 24 hours) at 01/12/2020 0921 Last data filed at 01/12/2020 0500 Gross per 24 hour  Intake 1026.58 ml  Output --  Net 1026.58 ml   Filed Weights   01/09/20 0800 01/11/20 0500  Weight: 79.4 kg 82.8 kg    Examination:  GENERAL: No acute distress.  Appears well.  HEENT: MMM.  Vision and hearing grossly intact.  NECK: Supple.  No apparent JVD.  RESP:  No IWOB. Good air movement bilaterally. CVS:  RRR. Heart sounds normal.  ABD/GI/GU: Bowel sounds present. Soft.  Mild discomfort with deep palpation. MSK/EXT:  Moves extremities. No apparent deformity. No edema.  SKIN: no apparent skin lesion or wound NEURO: Awake, alert and oriented  appropriately.  No apparent focal neuro deficit. PSYCH: Calm. Normal affect.  Procedures:  None  Assessment & Plan: Acute calculus pancreatitis: Noted on CT abdomen and pelvis.  Abdominal pain resolved.  LFT and lipase normalized.  She likely passed the stone.  Has no fever or leukocytosis to suggest cholangitis. -GI following-plan EGD today. -Antibiotics (CTX and Flagyl)-may discontinue after EGD -Analgesics and antiemetics.  Necrotic gastric mass in patient with history of GI stromal tumor resected in 2011.  Noted on CT abdomen and pelvis this admission.  Concern about recurrence of GI stromal cancer -GI and general surgery following-plan for EGD today. -Consult oncology  Elevated liver enzymes/hyperbilirubinemia: Likely due to the above.  Resolved.  Essential hypertension: Normotensive for most part -IV metoprolol 2.5 mg while n.p.o. this morning -Continue p.o. Coreg after EGD. -As needed labetalol  Hypothyroidism -Change to oral Synthroid  AKI on CKD-3a: Resolved. -Continue monitoring  Iron deficiency anemia: Baseline Hgb 8-9> 9.5 (admit)> 9.1> 8.9.  Iron saturation 7%.  Relatively stable.  -Received IV Feraheme on 2/7  History of "cardiac arrest".  -Telemetry monitoring  Leukocytosis: Likely due to #1. -Antibiotics as above -Continue monitoring  Debility: Uses walker and Rollator at baseline. -PT/OT-HH versus SNF  Cough and postnasal drainage -Flonase nasal spray and Claritin  Goal of care: Full code.  Now concern for recurrent GI stromal tumor.  Per general surgery  not resectable.  -Palliative care consult               DVT prophylaxis: Continue subcu Lovenox Code Status: Full code Family Communication: Patient and RN.  Updated patient's daughter on 2/6.  Discharge barrier: Evaluation for necrotic GI mass/recurrent GI stromal tumor Patient is from: Home Final disposition: To be determined based on clinical progress.  Consultants: General surgery,  GI, oncology and palliative care   Microbiology summarized: [id number] negative Influenza PCR negative  Sch Meds:  Scheduled Meds: . carvedilol  3.125 mg Oral BID WC  . enoxaparin (LOVENOX) injection  40 mg Subcutaneous Q24H  . fluticasone  2 spray Each Nare Daily  . levothyroxine  50 mcg Oral Q0600  . loratadine  10 mg Oral Daily  . pantoprazole (PROTONIX) IV  40 mg Intravenous Q24H   Continuous Infusions: . sodium chloride 20 mL/hr at 01/12/20 0511  . cefTRIAXone (ROCEPHIN)  IV 2 g (01/11/20 1611)  . metronidazole 500 mg (01/12/20 0629)   PRN Meds:.acetaminophen **OR** acetaminophen, alum & mag hydroxide-simeth, ipratropium-albuterol, labetalol, morphine injection, ondansetron **OR** ondansetron (ZOFRAN) IV, oxymetazoline  Antimicrobials: Anti-infectives (From admission, onward)   Start     Dose/Rate Route Frequency Ordered Stop   01/10/20 1400  cefTRIAXone (ROCEPHIN) 2 g in sodium chloride 0.9 % 100 mL IVPB     2 g 200 mL/hr over 30 Minutes Intravenous Every 24 hours 01/10/20 1213     01/09/20 1830  ciprofloxacin (CIPRO) IVPB 400 mg  Status:  Discontinued     400 mg 200 mL/hr over 60 Minutes Intravenous Every 12 hours 01/09/20 0853 01/10/20 1213   01/09/20 1400  metroNIDAZOLE (FLAGYL) IVPB 500 mg     500 mg 100 mL/hr over 60 Minutes Intravenous Every 8 hours 01/09/20 0611     01/09/20 0630  ciprofloxacin (CIPRO) IVPB 400 mg     400 mg 200 mL/hr over 60 Minutes Intravenous  Once 01/09/20 0616 01/09/20 0909   01/09/20 0445  cefTRIAXone (ROCEPHIN) 1 g in sodium chloride 0.9 % 100 mL IVPB     1 g 200 mL/hr over 30 Minutes Intravenous  Once 01/09/20 0432 01/09/20 0516   01/09/20 0445  metroNIDAZOLE (FLAGYL) IVPB 500 mg     500 mg 100 mL/hr over 60 Minutes Intravenous  Once 01/09/20 0432 01/09/20 0613       I have personally reviewed the following labs and images: CBC: Recent Labs  Lab 01/09/20 0131 01/10/20 0300 01/11/20 0543 01/12/20 0316  WBC 8.4 12.6* 12.9*  13.4*  HGB 9.5* 9.1* 8.7* 8.9*  HCT 30.8* 29.6* 27.4* 28.6*  MCV 94.5 94.3 92.3 93.2  PLT 216 178 165 194   BMP &GFR Recent Labs  Lab 01/09/20 0131 01/10/20 0300 01/11/20 0543 01/12/20 0316  NA 141 141  --  138  K 4.0 4.0  --  3.5  CL 107 110  --  107  CO2 23 21*  --  22  GLUCOSE 153* 86  --  89  BUN 14 9  --  12  CREATININE 1.17* 1.10*  --  1.02*  CALCIUM 9.6 8.6*  --  8.8*  MG  --  1.9 2.0 1.9  PHOS  --  3.8 2.8 3.0   Estimated Creatinine Clearance: 37 mL/min (A) (by C-G formula based on SCr of 1.02 mg/dL (H)). Liver & Pancreas: Recent Labs  Lab 01/09/20 0131 01/10/20 0300 01/11/20 0543 01/12/20 0316  AST 405* 80* 34 23  ALT 140* 89* 55* 40  ALKPHOS  145* 109 91 95  BILITOT 1.3* 1.1 0.8 1.0  PROT 6.3* 5.1* 5.2* 5.3*  ALBUMIN 3.2* 2.4* 2.2* 2.2*   Recent Labs  Lab 01/09/20 0131 01/10/20 0300 01/11/20 0724 01/12/20 0316  LIPASE 2,582* 256* 53* 28   No results for input(s): AMMONIA in the last 168 hours. Diabetic: No results for input(s): HGBA1C in the last 72 hours. Recent Labs  Lab 01/11/20 1800 01/11/20 2027 01/12/20 0204 01/12/20 0558 01/12/20 0844  GLUCAP 104* 106* 89 88 88   Cardiac Enzymes: No results for input(s): CKTOTAL, CKMB, CKMBINDEX, TROPONINI in the last 168 hours. No results for input(s): PROBNP in the last 8760 hours. Coagulation Profile: Recent Labs  Lab 01/09/20 1501  INR 1.2   Thyroid Function Tests: No results for input(s): TSH, T4TOTAL, FREET4, T3FREE, THYROIDAB in the last 72 hours. Lipid Profile: No results for input(s): CHOL, HDL, LDLCALC, TRIG, CHOLHDL, LDLDIRECT in the last 72 hours. Anemia Panel: Recent Labs    01/11/20 0724  VITAMINB12 5,673*  FOLATE 32.4  FERRITIN 79  TIBC 193*  IRON 13*  RETICCTPCT 1.8   Urine analysis:    Component Value Date/Time   COLORURINE YELLOW 12/11/2019 1337   APPEARANCEUR CLEAR 12/11/2019 1337   LABSPEC 1.018 12/11/2019 1337   PHURINE 5.0 12/11/2019 1337   GLUCOSEU  NEGATIVE 12/11/2019 1337   HGBUR NEGATIVE 12/11/2019 1337   BILIRUBINUR NEGATIVE 12/11/2019 1337   KETONESUR NEGATIVE 12/11/2019 1337   PROTEINUR 30 (A) 12/11/2019 1337   UROBILINOGEN 0.2 11/12/2010 2324   NITRITE NEGATIVE 12/11/2019 1337   LEUKOCYTESUR NEGATIVE 12/11/2019 1337   Sepsis Labs: Invalid input(s): PROCALCITONIN, [place]  Microbiology: Recent Results (from the past 240 hour(s))  Respiratory Panel by RT PCR (Flu A&B, Covid) - Nasopharyngeal Swab     Status: None   Collection Time: 01/09/20  4:16 AM   Specimen: Nasopharyngeal Swab  Result Value Ref Range Status   SARS Coronavirus 2 by RT PCR NEGATIVE NEGATIVE Final    Comment: (NOTE) SARS-CoV-2 target nucleic acids are NOT DETECTED. The SARS-CoV-2 RNA is generally detectable in upper respiratoy specimens during the acute phase of infection. The lowest concentration of SARS-CoV-2 viral copies this assay can detect is 131 copies/mL. A negative result does not preclude SARS-Cov-2 infection and should not be used as the sole basis for treatment or other patient management decisions. A negative result may occur with  improper specimen collection/handling, submission of specimen other than nasopharyngeal swab, presence of viral mutation(s) within the areas targeted by this assay, and inadequate number of viral copies (<131 copies/mL). A negative result must be combined with clinical observations, patient history, and epidemiological information. The expected result is Negative. Fact Sheet for Patients:  PinkCheek.be Fact Sheet for Healthcare Providers:  GravelBags.it This test is not yet ap proved or cleared by the [place] FDA and  has been authorized for detection and/or diagnosis of SARS-CoV-2 by FDA under an Emergency Use Authorization (EUA). This EUA will remain  in effect (meaning this test can be used) for the duration of the COVID-19 declaration  under Section 564(b)(1) of the Act, 21 U.S.C. section 360bbb-3(b)(1), unless the authorization is terminated or revoked sooner.    Influenza A by PCR NEGATIVE NEGATIVE Final   Influenza B by PCR NEGATIVE NEGATIVE Final    Comment: (NOTE) The Xpert Xpress SARS-CoV-2/FLU/RSV assay is intended as an aid in  the diagnosis of influenza from Nasopharyngeal swab specimens and  should not be used as a sole basis for treatment. Nasal washings  and  aspirates are unacceptable for Xpert Xpress SARS-CoV-2/FLU/RSV  testing. Fact Sheet for Patients: PinkCheek.be Fact Sheet for Healthcare Providers: GravelBags.it This test is not yet approved or cleared by the [place] FDA and  has been authorized for detection and/or diagnosis of SARS-CoV-2 by  FDA under an Emergency Use Authorization (EUA). This EUA will remain  in effect (meaning this test can be used) for the duration of the  Covid-19 declaration under Section 564(b)(1) of the Act, 21  U.S.C. section 360bbb-3(b)(1), unless the authorization is  terminated or revoked. Performed at [facility], [place] [address] [place], [place] [address]     Radiology Studies: No results found.    Royston Bekele T. Valliant  If 7PM-7AM, please contact night-coverage [email or web address] Password [facility] 01/12/2020, 9:21 AM

## 2020-01-13 ENCOUNTER — Inpatient Hospital Stay (HOSPITAL_COMMUNITY): Payer: Medicare Other

## 2020-01-13 DIAGNOSIS — Z7189 Other specified counseling: Secondary | ICD-10-CM

## 2020-01-13 DIAGNOSIS — E876 Hypokalemia: Secondary | ICD-10-CM

## 2020-01-13 DIAGNOSIS — Z515 Encounter for palliative care: Secondary | ICD-10-CM

## 2020-01-13 DIAGNOSIS — K851 Biliary acute pancreatitis without necrosis or infection: Principal | ICD-10-CM

## 2020-01-13 DIAGNOSIS — R7401 Elevation of levels of liver transaminase levels: Secondary | ICD-10-CM

## 2020-01-13 LAB — COMPREHENSIVE METABOLIC PANEL
ALT: 27 U/L (ref 0–44)
AST: 18 U/L (ref 15–41)
Albumin: 2.1 g/dL — ABNORMAL LOW (ref 3.5–5.0)
Alkaline Phosphatase: 83 U/L (ref 38–126)
Anion gap: 10 (ref 5–15)
BUN: 11 mg/dL (ref 8–23)
CO2: 22 mmol/L (ref 22–32)
Calcium: 8.8 mg/dL — ABNORMAL LOW (ref 8.9–10.3)
Chloride: 107 mmol/L (ref 98–111)
Creatinine, Ser: 1 mg/dL (ref 0.44–1.00)
GFR calc Af Amer: 57 mL/min — ABNORMAL LOW (ref >60)
GFR calc non Af Amer: 50 mL/min — ABNORMAL LOW (ref >60)
Glucose, Bld: 97 mg/dL (ref 70–99)
Potassium: 3.4 mmol/L — ABNORMAL LOW (ref 3.5–5.1)
Sodium: 139 mmol/L (ref 135–145)
Total Bilirubin: 0.7 mg/dL (ref 0.3–1.2)
Total Protein: 5.2 g/dL — ABNORMAL LOW (ref 6.5–8.1)

## 2020-01-13 LAB — CBC
HCT: 27.2 % — ABNORMAL LOW (ref 36.0–46.0)
Hemoglobin: 8.5 g/dL — ABNORMAL LOW (ref 12.0–15.0)
MCH: 29 pg (ref 26.0–34.0)
MCHC: 31.3 g/dL (ref 30.0–36.0)
MCV: 92.8 fL (ref 80.0–100.0)
Platelets: 211 10*3/uL (ref 150–400)
RBC: 2.93 MIL/uL — ABNORMAL LOW (ref 3.87–5.11)
RDW: 17.7 % — ABNORMAL HIGH (ref 11.5–15.5)
WBC: 14.4 10*3/uL — ABNORMAL HIGH (ref 4.0–10.5)
nRBC: 0 % (ref 0.0–0.2)

## 2020-01-13 LAB — MAGNESIUM: Magnesium: 1.8 mg/dL (ref 1.7–2.4)

## 2020-01-13 LAB — GLUCOSE, CAPILLARY
Glucose-Capillary: 112 mg/dL — ABNORMAL HIGH (ref 70–99)
Glucose-Capillary: 129 mg/dL — ABNORMAL HIGH (ref 70–99)
Glucose-Capillary: 84 mg/dL (ref 70–99)
Glucose-Capillary: 90 mg/dL (ref 70–99)

## 2020-01-13 LAB — ECHOCARDIOGRAM COMPLETE
Height: 62.5 in
Weight: 2920.65 oz

## 2020-01-13 LAB — SURGICAL PATHOLOGY

## 2020-01-13 LAB — PHOSPHORUS: Phosphorus: 2.9 mg/dL (ref 2.5–4.6)

## 2020-01-13 MED ORDER — GABAPENTIN 300 MG PO CAPS
300.0000 mg | ORAL_CAPSULE | Freq: Two times a day (BID) | ORAL | Status: DC
  Administered 2020-01-13 – 2020-01-14 (×3): 300 mg via ORAL
  Filled 2020-01-13 (×3): qty 1

## 2020-01-13 MED ORDER — VITAMIN B-6 100 MG PO TABS
50.0000 mg | ORAL_TABLET | Freq: Every day | ORAL | Status: DC
  Administered 2020-01-13 – 2020-01-16 (×4): 50 mg via ORAL
  Filled 2020-01-13 (×3): qty 1

## 2020-01-13 MED ORDER — OXYCODONE HCL 5 MG PO TABS
5.0000 mg | ORAL_TABLET | Freq: Three times a day (TID) | ORAL | Status: DC | PRN
  Filled 2020-01-13: qty 1

## 2020-01-13 MED ORDER — POTASSIUM CHLORIDE CRYS ER 20 MEQ PO TBCR
40.0000 meq | EXTENDED_RELEASE_TABLET | ORAL | Status: AC
  Administered 2020-01-13 (×2): 40 meq via ORAL
  Filled 2020-01-13 (×2): qty 2

## 2020-01-13 NOTE — Progress Notes (Signed)
  Echocardiogram 2D Echocardiogram has been performed.  Monique Lin M 01/13/2020, 10:46 AM

## 2020-01-13 NOTE — Progress Notes (Signed)
Patient ID: Monique Lin, female   DOB: November 12, 1927, 86 y.o.   MRN: [id number]    1 Day Post-Op  Subjective: Patient didn't do well with full liquids yesterday but mainly because she seems to be lactose intolerant and the ice cream didn't settle well for her.  Her abdominal pain seems improved from time of admission.  She is currently getting and echo.  No further nausea.  ROS: See above, otherwise other systems negative  Objective: Vital signs in last 24 hours: Temp:  [98.2 F (36.8 C)-99.1 F (37.3 C)] 98.2 F (36.8 C) (02/09 0813) Pulse Rate:  [81-91] 91 (02/09 0813) Resp:  [19-22] 19 (02/09 0813) BP: (111-155)/(59-70) 155/70 (02/09 0813) SpO2:  [92 %-100 %] 99 % (02/09 0813) Last BM Date: 01/11/20  Intake/Output from previous day: 02/08 0701 - 02/09 0700 In: 1 [P.O.:120; I.V.:300] Out: 400 [Urine:400] Intake/Output this shift: No intake/output data recorded.  PE: Heart: regular Lungs: CTAB Abd: soft, minimal epigastric tenderness, but no guarding or rebounding, +BS, obese, ND Ext: MAE Psych: A&Ox3  Lab Results:  Recent Labs    01/12/20 0316 01/13/20 0210  WBC 13.4* 14.4*  HGB 8.9* 8.5*  HCT 28.6* 27.2*  PLT 194 211   BMET Recent Labs    01/12/20 0316 01/13/20 0210  NA 138 139  K 3.5 3.4*  CL 107 107  CO2 22 22  GLUCOSE 89 97  BUN 12 11  CREATININE 1.02* 1.00  CALCIUM 8.8* 8.8*   PT/INR No results for input(s): LABPROT, INR in the last 72 hours. CMP     Component Value Date/Time   NA 139 01/13/2020 0210   K 3.4 (L) 01/13/2020 0210   CL 107 01/13/2020 0210   CO2 22 01/13/2020 0210   GLUCOSE 97 01/13/2020 0210   BUN 11 01/13/2020 0210   CREATININE 1.00 01/13/2020 0210   CALCIUM 8.8 (L) 01/13/2020 0210   PROT 5.2 (L) 01/13/2020 0210   ALBUMIN 2.1 (L) 01/13/2020 0210   AST 18 01/13/2020 0210   ALT 27 01/13/2020 0210   ALKPHOS 83 01/13/2020 0210   BILITOT 0.7 01/13/2020 0210   GFRNONAA 50 (L) 01/13/2020 0210   GFRAA 57 (L) 01/13/2020 0210    Lipase     Component Value Date/Time   LIPASE 28 01/12/2020 0316       Studies/Results: No results found.  Anti-infectives: Anti-infectives (From admission, onward)   Start     Dose/Rate Route Frequency Ordered Stop   01/10/20 1400  cefTRIAXone (ROCEPHIN) 2 g in sodium chloride 0.9 % 100 mL IVPB  Status:  Discontinued     2 g 200 mL/hr over 30 Minutes Intravenous Every 24 hours 01/10/20 1213 01/13/20 0706   01/09/20 1830  ciprofloxacin (CIPRO) IVPB 400 mg  Status:  Discontinued     400 mg 200 mL/hr over 60 Minutes Intravenous Every 12 hours 01/09/20 0853 01/10/20 1213   01/09/20 1400  metroNIDAZOLE (FLAGYL) IVPB 500 mg  Status:  Discontinued     500 mg 100 mL/hr over 60 Minutes Intravenous Every 8 hours 01/09/20 0611 01/13/20 0706   01/09/20 0630  ciprofloxacin (CIPRO) IVPB 400 mg     400 mg 200 mL/hr over 60 Minutes Intravenous  Once 01/09/20 0616 01/09/20 0909   01/09/20 0445  cefTRIAXone (ROCEPHIN) 1 g in sodium chloride 0.9 % 100 mL IVPB     1 g 200 mL/hr over 30 Minutes Intravenous  Once 01/09/20 0432 01/09/20 0516   01/09/20 0445  metroNIDAZOLE (FLAGYL) IVPB 500  mg     500 mg 100 mL/hr over 60 Minutes Intravenous  Once 01/09/20 0432 01/09/20 1700       Assessment/Plan Gallstone Pancreatitis - resolved now clinically and chemically, question is what to do long term in 86 yo with recurrent unresectable gist. Options are just follow her, surgery, or ercp with sphincterotomy which might be most reasonable option.   - no plans for surgical intervention at this time though. Recurrent GIST - not resectable, EGD completed and biopsies taken.  Oncology saw her and suggested initiation of Gleevac. -Agree with Dr Clelia Schaumann note about palliative and goals of care  FEN - soft diet VTE - Lovenox ID - none currently needed   LOS: 4 days    Henreitta Cea , [facility] Surgery 01/13/2020, 10:53 AM Please see Amion for pager number during day hours 7:00am-4:30pm  or 7:00am -11:30am on weekends

## 2020-01-13 NOTE — Progress Notes (Signed)
Surprisingly, the pathology report on the biopsy taken yesterday did not show any malignancy.  I find this very hard to believe that there is not a malignancy there.  I have to believe that her GIST has recurred.  I think we need to have gastroenterology see her again and do another biopsy.  Her labs today show white cell count of 14.4.  Hemoglobin 8.5.  Platelet count 211,000.  She is iron deficient.  She will get another dose of iron I think in a day or so.  I started her on the [place].  Again I am almost convinced that she has a recurrent GIST malignancy.  However, we have to prove this histologically.  She had an echocardiogram done.  Results are pending.  Hopefully, she will be able to eat a little bit better.  It sounds like she is having a tough time with this right now.  Her albumin is only 2.1.  I think this clearly shows that she is malnourished.  Lattie Haw, MD  [facility] 6:2

## 2020-01-13 NOTE — Progress Notes (Signed)
PROGRESS NOTE  Monique Lin:[id number] DOB: [birth year]   PCP: [name], MD  Patient is from: Home.  Uses walker and Rollator at baseline.  DOA: 01/09/2020 LOS: 4  Brief Narrative / Interim history: 86 year old female with history of GI stromal tumor s/p resection in 2011, HTN,  Hypothyroidism, anemia and recent "cardiac arrest  ROSC after 1 minute of CPR" presenting with abdominal pain and admitted for acute gallstone pancreatitis and necrotic gastric mass as noted on CT abdomen and pelvis.  Lipase elevated to 2500.  AST 405.  ALT 140.  Total bili 1.3.   The next day, GI and general surgery consulted.  Pancreatitis and transaminitis resolved. EGD on 2/8 confirmed submucosal tumor in gastric fundus with minimal erosive changes but no active bleed.  Biopsy obtained.  Larynx, esophagus and duodenum normal.  Oncology consulted and initiated [place].   Subjective: No major events overnight or this morning.  Complains neuropathic pain in all extremities.  Feels weak and tired.  Abdominal pain improved.  Denies nausea or vomiting.  Denies melena or hematochezia.  Objective: Vitals:   01/12/20 1711 01/12/20 1931 01/12/20 2305 01/13/20 0813  BP: 135/60 (!) 113/59 (!) 140/59 (!) 155/70  Pulse: 90 81 88 91  Resp: 20 20 20 19   Temp: 98.7 F (37.1 C) 98.8 F (37.1 C) 98.2 F (36.8 C) 98.2 F (36.8 C)  TempSrc: Oral Oral  Oral  SpO2: 100% 98% 97% 99%  Weight:      Height:        Intake/Output Summary (Last 24 hours) at 01/13/2020 1028 Last data filed at 01/13/2020 0100 Gross per 24 hour  Intake 420 ml  Output 400 ml  Net 20 ml   Filed Weights   01/09/20 0800 01/11/20 0500  Weight: 79.4 kg 82.8 kg    Examination:  GENERAL: No apparent distress.  Nontoxic. HEENT: MMM.  Vision and hearing grossly intact.  NECK: Supple.  No apparent JVD.  RESP:  No IWOB. Good air movement bilaterally. CVS:  RRR. Heart sounds normal.  ABD/GI/GU: Bowel sounds present. Soft.  Mild discomfort to  deep palpation. MSK/EXT:  Moves extremities. No apparent deformity. No edema.  SKIN: no apparent skin lesion or wound NEURO: Awake, alert and oriented appropriately.  No apparent focal neuro deficit. PSYCH: Calm.  Anxious  Procedures:  2/8-EGD on 2/8 confirmed submucosal tumor in gastric fundus with minimal erosive changes but no active bleed.  Biopsy obtained.  Larynx, esophagus and duodenum normal.   Assessment & Plan: Acute calculus pancreatitis: Noted on CT abdomen and pelvis.  Abdominal pain resolved.  LFT and lipase normalized.  She likely passed the stone.  Has no fever or leukocytosis to suggest cholangitis. -GI following-plan EGD today. -Discontinue antibiotic -Tylenol and as needed oxycodone for pain.  Recurrent GI stromal tumor: noted on CT and EGD. -Follow pathology. -Oncology started [place] -Follow echocardiogram.  Elevated liver enzymes/hyperbilirubinemia: Likely due to the above.  Resolved.  Essential hypertension: Normotensive for most part -Continue p.o. Coreg after EGD. -As needed labetalol  Hypothyroidism -Change to oral Synthroid  AKI on CKD-3a: Resolved. -Continue monitoring  Iron deficiency anemia: Baseline Hgb 8-9> 9.5 (admit)> 9.1> 8.9>8.5.  Iron saturation 7%.  Relatively stable.  -Received IV Feraheme on 2/7.  Will receive repeat dose on 2/10. -Continue monitoring  History of "cardiac arrest".  -Telemetry monitoring  Leukocytosis: Likely due to #1.  Suspect demargination versus infectious process. -Continue monitoring  Debility: Uses walker and Rollator at baseline. -PT/OT-recommended SNF.  Patient and  family prefers home with home health.  Cough and postnasal drainage -Flonase nasal spray and Claritin  Neuropathic pain: Previously on gabapentin.  -Start gabapentin 300 mg twice daily -Pyridoxine 50 mg daily  Hypokalemia: -Replenish and recheck.  Goal of care: Full code.  Now concern for recurrent GI stromal tumor.  Per general surgery  not resectable.  -Palliative care consult               DVT prophylaxis: Continue subcu Lovenox Code Status: Full code Family Communication: Patient and RN.  Updated patient's daughter on 2/8.  Discharge barrier: Gastric stromal tumor, oncology initiating chemo inhouse Patient is from: Home Final disposition: Home with home health likely on 2/10.  Consultants: General surgery, GI, oncology and palliative care   Microbiology summarized: [id number] negative Influenza PCR negative  Sch Meds:  Scheduled Meds: . carvedilol  3.125 mg Oral BID WC  . enoxaparin (LOVENOX) injection  40 mg Subcutaneous Q24H  . fluticasone  2 spray Each Nare Daily  . gabapentin  300 mg Oral BID  . guaiFENesin  600 mg Oral BID  . imatinib  400 mg Oral Q breakfast  . levothyroxine  50 mcg Oral Q0600  . loratadine  10 mg Oral Daily  . pantoprazole (PROTONIX) IV  40 mg Intravenous Q24H  . potassium chloride  40 mEq Oral Q4H  . vitamin B-6  50 mg Oral Daily   Continuous Infusions: . [START ON 01/14/2020] ferumoxytol     PRN Meds:.acetaminophen **OR** acetaminophen, alum & mag hydroxide-simeth, ipratropium-albuterol, labetalol, ondansetron **OR** ondansetron (ZOFRAN) IV, oxyCODONE, oxymetazoline  Antimicrobials: Anti-infectives (From admission, onward)   Start     Dose/Rate Route Frequency Ordered Stop   01/10/20 1400  cefTRIAXone (ROCEPHIN) 2 g in sodium chloride 0.9 % 100 mL IVPB  Status:  Discontinued     2 g 200 mL/hr over 30 Minutes Intravenous Every 24 hours 01/10/20 1213 01/13/20 0706   01/09/20 1830  ciprofloxacin (CIPRO) IVPB 400 mg  Status:  Discontinued     400 mg 200 mL/hr over 60 Minutes Intravenous Every 12 hours 01/09/20 0853 01/10/20 1213   01/09/20 1400  metroNIDAZOLE (FLAGYL) IVPB 500 mg  Status:  Discontinued     500 mg 100 mL/hr over 60 Minutes Intravenous Every 8 hours 01/09/20 0611 01/13/20 0706   01/09/20 0630  ciprofloxacin (CIPRO) IVPB 400 mg     400 mg 200 mL/hr over  60 Minutes Intravenous  Once 01/09/20 0616 01/09/20 0909   01/09/20 0445  cefTRIAXone (ROCEPHIN) 1 g in sodium chloride 0.9 % 100 mL IVPB     1 g 200 mL/hr over 30 Minutes Intravenous  Once 01/09/20 0432 01/09/20 0516   01/09/20 0445  metroNIDAZOLE (FLAGYL) IVPB 500 mg     500 mg 100 mL/hr over 60 Minutes Intravenous  Once 01/09/20 0432 01/09/20 0613       I have personally reviewed the following labs and images: CBC: Recent Labs  Lab 01/09/20 0131 01/10/20 0300 01/11/20 0543 01/12/20 0316 01/13/20 0210  WBC 8.4 12.6* 12.9* 13.4* 14.4*  HGB 9.5* 9.1* 8.7* 8.9* 8.5*  HCT 30.8* 29.6* 27.4* 28.6* 27.2*  MCV 94.5 94.3 92.3 93.2 92.8  PLT 216 178 165 194 211   BMP &GFR Recent Labs  Lab 01/09/20 0131 01/10/20 0300 01/11/20 0543 01/12/20 0316 01/13/20 0210  NA 141 141  --  138 139  K 4.0 4.0  --  3.5 3.4*  CL 107 110  --  107 107  CO2 23 21*  --  22 22  GLUCOSE 153* 86  --  89 97  BUN 14 9  --  12 11  CREATININE 1.17* 1.10*  --  1.02* 1.00  CALCIUM 9.6 8.6*  --  8.8* 8.8*  MG  --  1.9 2.0 1.9 1.8  PHOS  --  3.8 2.8 3.0 2.9   Estimated Creatinine Clearance: 37.7 mL/min (by C-G formula based on SCr of 1 mg/dL). Liver & Pancreas: Recent Labs  Lab 01/09/20 0131 01/10/20 0300 01/11/20 0543 01/12/20 0316 01/13/20 0210  AST 405* 80* 34 23 18  ALT 140* 89* 55* 40 27  ALKPHOS 145* 109 91 95 83  BILITOT 1.3* 1.1 0.8 1.0 0.7  PROT 6.3* 5.1* 5.2* 5.3* 5.2*  ALBUMIN 3.2* 2.4* 2.2* 2.2* 2.1*   Recent Labs  Lab 01/09/20 0131 01/10/20 0300 01/11/20 0724 01/12/20 0316  LIPASE 2,582* 256* 53* 28   No results for input(s): AMMONIA in the last 168 hours. Diabetic: No results for input(s): HGBA1C in the last 72 hours. Recent Labs  Lab 01/12/20 0844 01/12/20 1242 01/12/20 1800 01/12/20 2328 01/13/20 0607  GLUCAP 88 88 150* 96 84   Cardiac Enzymes: No results for input(s): CKTOTAL, CKMB, CKMBINDEX, TROPONINI in the last 168 hours. No results for input(s): PROBNP  in the last 8760 hours. Coagulation Profile: Recent Labs  Lab 01/09/20 1501  INR 1.2   Thyroid Function Tests: No results for input(s): TSH, T4TOTAL, FREET4, T3FREE, THYROIDAB in the last 72 hours. Lipid Profile: No results for input(s): CHOL, HDL, LDLCALC, TRIG, CHOLHDL, LDLDIRECT in the last 72 hours. Anemia Panel: Recent Labs    01/11/20 0724  VITAMINB12 5,673*  FOLATE 32.4  FERRITIN 79  TIBC 193*  IRON 13*  RETICCTPCT 1.8   Urine analysis:    Component Value Date/Time   COLORURINE YELLOW 12/11/2019 1337   APPEARANCEUR CLEAR 12/11/2019 1337   LABSPEC 1.018 12/11/2019 1337   PHURINE 5.0 12/11/2019 1337   GLUCOSEU NEGATIVE 12/11/2019 1337   HGBUR NEGATIVE 12/11/2019 1337   BILIRUBINUR NEGATIVE 12/11/2019 1337   KETONESUR NEGATIVE 12/11/2019 1337   PROTEINUR 30 (A) 12/11/2019 1337   UROBILINOGEN 0.2 11/12/2010 2324   NITRITE NEGATIVE 12/11/2019 1337   LEUKOCYTESUR NEGATIVE 12/11/2019 1337   Sepsis Labs: Invalid input(s): PROCALCITONIN, [place]  Microbiology: Recent Results (from the past 240 hour(s))  Respiratory Panel by RT PCR (Flu A&B, Covid) - Nasopharyngeal Swab     Status: None   Collection Time: 01/09/20  4:16 AM   Specimen: Nasopharyngeal Swab  Result Value Ref Range Status   SARS Coronavirus 2 by RT PCR NEGATIVE NEGATIVE Final    Comment: (NOTE) SARS-CoV-2 target nucleic acids are NOT DETECTED. The SARS-CoV-2 RNA is generally detectable in upper respiratoy specimens during the acute phase of infection. The lowest concentration of SARS-CoV-2 viral copies this assay can detect is 131 copies/mL. A negative result does not preclude SARS-Cov-2 infection and should not be used as the sole basis for treatment or other patient management decisions. A negative result may occur with  improper specimen collection/handling, submission of specimen other than nasopharyngeal swab, presence of viral mutation(s) within the areas targeted by this assay, and  inadequate number of viral copies (<131 copies/mL). A negative result must be combined with clinical observations, patient history, and epidemiological information. The expected result is Negative. Fact Sheet for Patients:  PinkCheek.be Fact Sheet for Healthcare Providers:  GravelBags.it This test is not yet ap proved or cleared by the [place] and  has been authorized for  detection and/or diagnosis of SARS-CoV-2 by FDA under an Emergency Use Authorization (EUA). This EUA will remain  in effect (meaning this test can be used) for the duration of the COVID-19 declaration under Section 564(b)(1) of the Act, 21 U.S.C. section 360bbb-3(b)(1), unless the authorization is terminated or revoked sooner.    Influenza A by PCR NEGATIVE NEGATIVE Final   Influenza B by PCR NEGATIVE NEGATIVE Final    Comment: (NOTE) The Xpert Xpress SARS-CoV-2/FLU/RSV assay is intended as an aid in  the diagnosis of influenza from Nasopharyngeal swab specimens and  should not be used as a sole basis for treatment. Nasal washings and  aspirates are unacceptable for Xpert Xpress SARS-CoV-2/FLU/RSV  testing. Fact Sheet for Patients: PinkCheek.be Fact Sheet for Healthcare Providers: GravelBags.it This test is not yet approved or cleared by the [place] FDA and  has been authorized for detection and/or diagnosis of SARS-CoV-2 by  FDA under an Emergency Use Authorization (EUA). This EUA will remain  in effect (meaning this test can be used) for the duration of the  Covid-19 declaration under Section 564(b)(1) of the Act, 21  U.S.C. section 360bbb-3(b)(1), unless the authorization is  terminated or revoked. Performed at [facility], [place] [address], [place], [place] [address]     Radiology Studies: No results found.    Rhone Ozaki T. McCammon  If 7PM-7AM, please  contact night-coverage [email or web address] Password [facility] 01/13/2020, 10:28 AM

## 2020-01-13 NOTE — Consult Note (Signed)
Consultation Note Date: 01/13/2020   Patient Name: Monique Lin  DOB: Jul 21, 1927  MRN: [id number]  Age / Sex: 86 y.o., female  PCP: [name], MD Referring Physician: Mercy Riding, MD  Reason for Consultation: Establishing goals of care  HPI/Patient Profile: 86 y.o. female  with past medical history of GI stromal tumor s/p resection in 2011, HTN, hypothyroidism, anemia, recent cardiac arrest with ROSC after 1 minute of CPR admitted on 01/09/2020 with abdominal pain. Hospital admission for acute gallstone pancreatitis and necrotic gastric mass noted on CT abdomen/pelvis. GI and general surgery following. Pancreatitis and transaminitis resolved. EGD 2/8 confirmed submucosal tumor in gastric fundus with minimal erosive changes, no active bleed. Pending biopsy. Oncology following and initiated [place] with suspicion that this is recurrence of GIST tumor. Palliative medicine consultation for goals of care.    Clinical Assessment and Goals of Care:  I have reviewed medical records, discussed with care team and assessed the patient at bedside. Patient is awake, alert, oriented and able to participate in discussion.This morning when attempting to see patient, she is having a coughing spell and requesting breathing treatment. She shares that this happens at home with her bronchitis and post-nasal drip, and her nebulizer machine typically helps. She requests that I do not call her daughter this afternoon because her daughter works and does not want her to be distracted while at work.  This afternoon, visited again with patient at bedside to discuss goals of care. She reports relief following breathing treatment and is feeling slightly better this afternoon. Denies abdominal pain. Wondering when lunch will arrive.   I introduced Palliative Medicine as specialized medical care for people living with serious illness. It focuses  on providing relief from the symptoms and stress of a serious illness. The goal is to improve quality of life for both the patient and the family.  We discussed a brief life review of the patient. Prior to admission, living home with daughter Rebecca Eaton). Her other daughter [name]) lives close to them and actively involved in her care. Baseline, she requires walker/WC for ambulation and assist with ADL's. Widowed. Her husband was an [employer] fast streamer and they traveled the world as a family.   Discussed events leading up to admission and course of hospitalization including diagnoses, interventions, plan of care. Reviewed recommendations from specialists including latest information that pathology did not show malignancy but that oncology and surgery want to consider another biopsy for confirmation.    Patient shares that in 2011, the GIST tumor was resected and benign, therefore she only required surgery. She shares her understanding that Dr. Marin Olp started [facility] and her hopes that by taking this medicine, the tumor will 'shrink' and she could live for many more years. She shares understanding that with her age, they do not want to operate.   Patient becomes 'worried' after hearing she may have to get another biopsy done but is willing to have another biopsy done to confirm diagnosis. Reassured of GI follow-up regarding plan of care.  Did not discuss advance directives/code status this afternoon, as patient becomes worried and anxious knowing she may have to receive another biopsy. She is not interested in working with physical therapy today and requests that I update PT. She respectfully requests to be left alone to rest this afternoon. Discussed with RN.  Reassured of ongoing support this admission. Emotional/spiritual support provided.    SUMMARY OF RECOMMENDATIONS    Full code/full scope treatment  Ongoing palliative discussions regarding goals, code status/advance directives. Patient  becomes worried/anxious during discussion, hearing that she may need another biopsy to confirm diagnosis. She is willing to have another biopsy if recommended.   GI to follow-up tomorrow.   Patient wishes to continue [place] if ongoing recommendation by oncology. Denies current symptoms.   PMT provider will follow.   Code Status/Advance Care Planning:  Full code  Symptom Management:   Per attending  Palliative Prophylaxis:   Aspiration, Bowel Regimen, Delirium Protocol and Frequent Pain Assessment  Psycho-social/Spiritual:   Desire for further Chaplaincy support: yes  Additional Recommendations: Caregiving  Support/Resources  Prognosis:   Unable to determine  Discharge Planning: To Be Determined      Primary Diagnoses: Present on Admission: . Acute gallstone pancreatitis . Gastric tumor . Acute renal failure (ARF) ([place]) . Essential hypertension . Hypothyroidism   I have reviewed the medical record, interviewed the patient and family, and examined the patient. The following aspects are pertinent.  Past Medical History:  Diagnosis Date  . Anemia   . Bronchitis   . Hypertension   . Hypothyroidism   . Leg pain   . Stomach tumor (benign)    Social History   Socioeconomic History  . Marital status: Widowed    Spouse name: Not on file  . Number of children: Not on file  . Years of education: Not on file  . Highest education level: Not on file  Occupational History  . Not on file  Tobacco Use  . Smoking status: Never Smoker  . Smokeless tobacco: Never Used  Substance and Sexual Activity  . Alcohol use: No  . Drug use: No  . Sexual activity: Not Currently  Other Topics Concern  . Not on file  Social History Narrative  . Not on file   Social Determinants of Health   Financial Resource Strain:   . Difficulty of Paying Living Expenses: Not on file  Food Insecurity:   . Worried About Charity fundraiser in the Last Year: Not on file  . Ran Out of  Food in the Last Year: Not on file  Transportation Needs:   . Lack of Transportation (Medical): Not on file  . Lack of Transportation (Non-Medical): Not on file  Physical Activity:   . Days of Exercise per Week: Not on file  . Minutes of Exercise per Session: Not on file  Stress:   . Feeling of Stress : Not on file  Social Connections:   . Frequency of Communication with Friends and Family: Not on file  . Frequency of Social Gatherings with Friends and Family: Not on file  . Attends Religious Services: Not on file  . Active Member of Clubs or Organizations: Not on file  . Attends Archivist Meetings: Not on file  . Marital Status: Not on file   Family History  Family history unknown: Yes   Scheduled Meds: . carvedilol  3.125 mg Oral BID WC  . enoxaparin (LOVENOX) injection  40 mg Subcutaneous Q24H  . fluticasone  2  spray Each Nare Daily  . gabapentin  300 mg Oral BID  . guaiFENesin  600 mg Oral BID  . imatinib  400 mg Oral Q breakfast  . levothyroxine  50 mcg Oral Q0600  . loratadine  10 mg Oral Daily  . pantoprazole (PROTONIX) IV  40 mg Intravenous Q24H  . vitamin B-6  50 mg Oral Daily   Continuous Infusions: . [START ON 01/14/2020] ferumoxytol     PRN Meds:.acetaminophen **OR** acetaminophen, alum & mag hydroxide-simeth, ipratropium-albuterol, labetalol, ondansetron **OR** ondansetron (ZOFRAN) IV, oxyCODONE, oxymetazoline Medications Prior to Admission:  Prior to Admission medications   Medication Sig Start Date End Date Taking? Authorizing Provider  acetaminophen (TYLENOL) 500 MG tablet Take 1 tablet (500 mg total) by mouth every 4 (four) hours as needed (pain). 12/22/19  Yes [name], MD  albuterol (PROVENTIL) (2.5 MG/3ML) 0.083% nebulizer solution Take 2.5 mg by nebulization every 6 (six) hours as needed for wheezing.   Yes [provider]  albuterol (VENTOLIN HFA) 108 (90 Base) MCG/ACT inhaler Inhale 1 puff into the lungs every 6 (six) hours as  needed for wheezing or shortness of breath.   Yes [provider]  alum & mag hydroxide-simeth (MAALOX/MYLANTA) 200-200-20 MG/5ML suspension Take 30 mLs by mouth every 6 (six) hours as needed for indigestion, heartburn or flatulence. 12/22/19  Yes [name], MD  amLODipine (NORVASC) 5 MG tablet Take 5 mg by mouth daily.   Yes [provider]  atorvastatin (LIPITOR) 20 MG tablet Take 20 mg by mouth daily.   Yes [provider]  bisacodyl (DULCOLAX) 5 MG EC tablet Take 1 tablet (5 mg total) by mouth daily as needed for moderate constipation. 12/22/19  Yes [name], MD  carvedilol (COREG) 6.25 MG tablet Take 1 tablet (6.25 mg total) by mouth 2 (two) times daily with a meal. 12/22/19  Yes [name], MD  Cholecalciferol (VITAMIN D-3) 125 MCG (5000 UT) TABS Take 5,000 Units by mouth daily.    Yes [provider]  Dextromethorphan-guaiFENesin (CORICIDIN HBP CONGESTION/COUGH PO) Take 2 tablets by mouth every 12 (twelve) hours as needed (congestion).   Yes [provider]  diphenhydrAMINE (BENADRYL) 25 MG tablet Take 25 mg by mouth every 6 (six) hours as needed for itching.   Yes [provider]  ferrous sulfate 325 (65 FE) MG tablet Take 1 tablet (325 mg total) by mouth 2 (two) times daily with a meal. 12/22/19  Yes [name], MD  fluticasone (FLONASE) 50 MCG/ACT nasal spray Place 2 sprays into both nostrils daily as needed for allergies or rhinitis.    Yes [provider]  folic acid (FOLVITE) 185 MCG tablet Take 800 mcg by mouth daily.    Yes [provider]  levothyroxine (SYNTHROID, LEVOTHROID) 50 MCG tablet Take 50 mcg by mouth daily.   Yes [provider]  pantoprazole (PROTONIX) 40 MG tablet Take 1 tablet (40 mg total) by mouth 2 (two) times daily. 12/22/19  Yes [name], MD  trolamine salicylate (ASPERCREME) 10 % cream Apply 1 application topically as needed for muscle pain.   Yes [provider]  vitamin E 180 MG (400 UNITS) capsule Take 400 Units by mouth daily.   Yes [provider]  Cyanocobalamin (VITAMIN B-12 PO) Take 500 mcg by mouth daily.     [provider]  doxycycline (VIBRA-TABS) 100 MG tablet Take 1 tablet (100 mg total) by mouth every 12 (twelve) hours. Patient not taking: Reported on 01/09/2020 12/22/19  [name], MD   Allergies  Allergen Reactions  . Codeine Other (See Comments)    Stomach cramps  . Penicillins Other (See Comments)    "Bad stomach cramps" Has patient had a PCN reaction causing immediate rash, facial/tongue/throat swelling, SOB or lightheadedness with hypotension: Unk Has patient had a PCN reaction causing severe rash involving mucus membranes or skin necrosis: Unk Has patient had a PCN reaction that required hospitalization: Unk Has patient had a PCN reaction occurring within the last 10 years: No If all of the above answers are "NO", then may proceed with Cephalosporin use.     Review of Systems  Constitutional: Positive for fatigue.  Respiratory: Positive for cough.    Physical Exam Vitals and nursing note reviewed.  Constitutional:      General: She is awake.     Appearance: She is ill-appearing.  HENT:     Head: Normocephalic and atraumatic.  Pulmonary:     Effort: No tachypnea, accessory muscle usage or respiratory distress.     Comments: Coughing spell during visit. Patient requesting breathing treatment. RN to give mucinex  Abdominal:     Tenderness: There is no abdominal tenderness.  Skin:    General: Skin is warm and dry.  Neurological:     Mental Status: She is alert and oriented to person, place, and time.     Vital Signs: BP (!) 155/70 (BP Location: Left Arm)   Pulse 91   Temp 98.2 F (36.8 C) (Oral)   Resp 19   Ht 5' 2.5" (1.588 m)   Wt 82.8 kg   SpO2 99%   BMI 32.86 kg/m  Pain Scale: 0-10 POSS *See Group Information*: 1-Acceptable,Awake and alert Pain Score: 0-No  pain   SpO2: SpO2: 99 % O2 Device:SpO2: 99 % O2 Flow Rate: .O2 Flow Rate (L/min): 2 L/min  IO: Intake/output summary:   Intake/Output Summary (Last 24 hours) at 01/13/2020 1206 Last data filed at 01/13/2020 0100 Gross per 24 hour  Intake 120 ml  Output 400 ml  Net -280 ml    LBM: Last BM Date: 01/11/20 Baseline Weight: Weight: 79.4 kg Most recent weight: Weight: 82.8 kg     Palliative Assessment/Data: PPS 40%     Time In/Out: 1100-1120, 1400-1440 Time Total: 60 Greater than 50%  of this time was spent counseling and coordinating care related to the above assessment and plan.  Signed by:  Ihor Dow, DNP, FNP-C Palliative Medicine Team  Phone: [phone] Fax: [phone]   Please contact Palliative Medicine Team phone at [phone] for questions and concerns.  For individual provider: See Shea Evans

## 2020-01-13 NOTE — Progress Notes (Signed)
Update: Discussed case with Dr. Marin Olp.  As he notes, the patient's biopsies were negative for any evidence of malignancy.  However, this is not surprising, since the patient's mass was submucosal, and the biopsies we are able to obtain endoscopically are mucosal biopsies only.  There was some slight mucosal irregularity/erosive change which I thought may be would show up as positive on the biopsies, but this was not the case.  Plan: I have arranged for the patient to have endoscopic ultrasound by Dr. Paulita Fujita on Thursday at 27 AM.  Sonographically, he may be able to tell us more about the nature of the lesion; more relevantly, he will be able to do fine-needle aspirate sampling of the lesion to obtain tissue for diagnosis.  Cleotis Nipper, M.D. Pager [phone] If no answer or after 5 PM call [phone]

## 2020-01-13 NOTE — Anesthesia Postprocedure Evaluation (Signed)
Anesthesia Post Note  Patient: Monique Lin  Procedure(s) Performed: ESOPHAGOGASTRODUODENOSCOPY (EGD) WITH PROPOFOL (N/A ) BIOPSY     Patient location during evaluation: PACU Anesthesia Type: MAC Level of consciousness: awake and alert Pain management: pain level controlled Vital Signs Assessment: post-procedure vital signs reviewed and stable Respiratory status: spontaneous breathing, nonlabored ventilation, respiratory function stable and patient connected to nasal cannula oxygen Cardiovascular status: stable and blood pressure returned to baseline Postop Assessment: no apparent nausea or vomiting Anesthetic complications: no    Last Vitals:  Vitals:   01/12/20 1931 01/12/20 2305  BP: (!) 113/59 (!) 140/59  Pulse: 81 88  Resp: 20 20  Temp: 37.1 C 36.8 C  SpO2: 98% 97%    Last Pain:  Vitals:   01/12/20 2155  TempSrc:   PainSc: 8                  Bitha Fauteux S

## 2020-01-13 NOTE — Progress Notes (Signed)
(  no charge--pt not seen)  Path pending.  Onc and Surg notes reviewed.  Not clear if her food intolerance is due to residual pancreatitis effect, or due to her gastric tumor.  Will plan to see pt tomorrow.   Call in the meantime if questions.  Cleotis Nipper, M.D. Pager [phone] If no answer or after 5 PM call [phone]

## 2020-01-13 NOTE — Progress Notes (Signed)
PT Cancellation Note  Patient Details Name: Monique Lin MRN: [id number] DOB: 1927-10-09   Cancelled Treatment:    Reason Eval/Treat Not Completed: Fatigue/lethargy limiting ability to participate. Pt reports tired and overwhelmed by medical information, requesting hold on PT treatment today. Will follow-up as schedule permits.  Mabeline Caras, PT, DPT Acute Rehabilitation Services  Pager [phone] Office [phone]  Derry Lory 01/13/2020, 3:47 PM

## 2020-01-14 DIAGNOSIS — L8990 Pressure ulcer of unspecified site, unspecified stage: Secondary | ICD-10-CM | POA: Insufficient documentation

## 2020-01-14 DIAGNOSIS — G629 Polyneuropathy, unspecified: Secondary | ICD-10-CM

## 2020-01-14 LAB — CBC
HCT: 28.7 % — ABNORMAL LOW (ref 36.0–46.0)
Hemoglobin: 9 g/dL — ABNORMAL LOW (ref 12.0–15.0)
MCH: 28.8 pg (ref 26.0–34.0)
MCHC: 31.4 g/dL (ref 30.0–36.0)
MCV: 92 fL (ref 80.0–100.0)
Platelets: 251 10*3/uL (ref 150–400)
RBC: 3.12 MIL/uL — ABNORMAL LOW (ref 3.87–5.11)
RDW: 17.8 % — ABNORMAL HIGH (ref 11.5–15.5)
WBC: 14.6 10*3/uL — ABNORMAL HIGH (ref 4.0–10.5)
nRBC: 0 % (ref 0.0–0.2)

## 2020-01-14 LAB — RENAL FUNCTION PANEL
Albumin: 2.1 g/dL — ABNORMAL LOW (ref 3.5–5.0)
Anion gap: 9 (ref 5–15)
BUN: 10 mg/dL (ref 8–23)
CO2: 22 mmol/L (ref 22–32)
Calcium: 9.1 mg/dL (ref 8.9–10.3)
Chloride: 108 mmol/L (ref 98–111)
Creatinine, Ser: 0.91 mg/dL (ref 0.44–1.00)
GFR calc Af Amer: >60 mL/min (ref >60)
GFR calc non Af Amer: 56 mL/min — ABNORMAL LOW (ref >60)
Glucose, Bld: 106 mg/dL — ABNORMAL HIGH (ref 70–99)
Phosphorus: 2.7 mg/dL (ref 2.5–4.6)
Potassium: 4.1 mmol/L (ref 3.5–5.1)
Sodium: 139 mmol/L (ref 135–145)

## 2020-01-14 LAB — GLUCOSE, CAPILLARY
Glucose-Capillary: 85 mg/dL (ref 70–99)
Glucose-Capillary: 129 mg/dL — ABNORMAL HIGH (ref 70–99)
Glucose-Capillary: 94 mg/dL (ref 70–99)

## 2020-01-14 LAB — MAGNESIUM: Magnesium: 1.8 mg/dL (ref 1.7–2.4)

## 2020-01-14 MED ORDER — GABAPENTIN 300 MG PO CAPS
300.0000 mg | ORAL_CAPSULE | Freq: Every morning | ORAL | Status: DC
  Administered 2020-01-15 – 2020-01-16 (×2): 300 mg via ORAL
  Filled 2020-01-14 (×2): qty 1

## 2020-01-14 MED ORDER — AMLODIPINE BESYLATE 5 MG PO TABS
5.0000 mg | ORAL_TABLET | Freq: Every day | ORAL | Status: DC
  Administered 2020-01-14 – 2020-01-15 (×2): 5 mg via ORAL
  Filled 2020-01-14 (×2): qty 1

## 2020-01-14 MED ORDER — GABAPENTIN 300 MG PO CAPS
600.0000 mg | ORAL_CAPSULE | Freq: Every day | ORAL | Status: DC
  Administered 2020-01-14 – 2020-01-15 (×2): 600 mg via ORAL
  Filled 2020-01-14 (×2): qty 2

## 2020-01-14 NOTE — Progress Notes (Signed)
PROGRESS NOTE  Monique Lin QQP:[id number] DOB: [birth year]   PCP: [name], MD  Patient is from: Home.  Uses walker and Rollator at baseline.  DOA: 01/09/2020 LOS: 5  Brief Narrative / Interim history: 86 year old female with history of GI stromal tumor s/p resection in 2011, HTN,  Hypothyroidism, anemia and recent "cardiac arrest  ROSC after 1 minute of CPR" presenting with abdominal pain and admitted for acute gallstone pancreatitis and necrotic gastric mass as noted on CT abdomen and pelvis.  Lipase elevated to 2500.  AST 405.  ALT 140.  Total bili 1.3.   The next day, GI and general surgery consulted.  Pancreatitis and transaminitis resolved. EGD on 2/8 confirmed submucosal tumor in gastric fundus with minimal erosive changes but no active bleed.  Biopsy negative but concern that biopsy was not deep enough.  Larynx, esophagus and duodenum normal.  Oncology consulted and initiated [place].  Plan is for EUS on 2/11 for deeper biopsy.   Subjective: No major events overnight or this morning.  Continues to complain neuropathic and arthritic pain in all extremities.  No significant improvement with gabapentin.  She now denies abdominal pain, nausea or vomiting.  Reports feeling cold and cough from postnasal drainage.  Objective: Vitals:   01/13/20 1718 01/13/20 2315 01/14/20 0500 01/14/20 0801  BP: (!) 150/62 (!) 154/67  (!) 164/71  Pulse: 90 86  96  Resp: 16 20  16   Temp: 98.8 F (37.1 C) 98.1 F (36.7 C)  98.6 F (37 C)  TempSrc:      SpO2: 99% 96%  98%  Weight:   83.6 kg   Height:       No intake or output data in the 24 hours ending 01/14/20 1006 Filed Weights   01/09/20 0800 01/11/20 0500 01/14/20 0500  Weight: 79.4 kg 82.8 kg 83.6 kg    Examination:  GENERAL: No apparent distress.  Nontoxic. HEENT: MMM.  Vision and hearing grossly intact.  NECK: Supple.  No apparent JVD.  RESP:  No IWOB.  Fair aeration bilaterally. CVS:  RRR. Heart sounds normal.  ABD/GI/GU:  Bowel sounds present. Soft. Non tender.  MSK/EXT:  Moves extremities. No apparent deformity. No edema.  SKIN: no apparent skin lesion or wound NEURO: Awake, alert and oriented fairly.  No apparent focal neuro deficit. PSYCH: Calm. Normal affect.   Procedures:  2/8-EGD on 2/8 confirmed submucosal tumor in gastric fundus with minimal erosive changes but no active bleed.  Biopsy obtained.  Larynx, esophagus and duodenum normal.   Assessment & Plan: Acute calculus pancreatitis: Noted on CT abdomen and pelvis.  She likely passed the stone.  Pancreatitis resolved. -Discontinued antibiotic  Recurrent GI stromal tumor: noted on CT and EGD.  Biopsy negative but concern that biopsy was not deep enough.   -Oncology started [place] on 2/9 -Plan for EUS by GI on 2/11 -General surgery following  Chronic diastolic CHF: Echo with EF of 55 to 60%, G2DD but no other structural functional abnormalities.  Not on diuretics.  Euvolemic. -Monitor fluid status  Elevated liver enzymes/hyperbilirubinemia: Likely due to the above.  Resolved.  Essential hypertension: SBP slightly elevated. -Continue p.o. Coreg.  Resume home amlodipine. -Labetalol as needed  Hypothyroidism -Continue home Synthroid.  AKI: Initially thought to have CKD-3A.  Resolved. -Continue monitoring  Iron deficiency anemia: Baseline Hgb 8-9> 9.5 (admit)> 9.1> 8.9>8.5> 9.0.  Iron saturation 7%.  Relatively stable.  -Received IV Feraheme on 2/7 and 2/11 -Continue monitoring  History of "cardiac arrest".  -Telemetry  monitoring  Leukocytosis: Likely due to #1.  Suspect demargination versus infectious process. -Continue monitoring  Debility: Uses walker and Rollator at baseline. -PT/OT-recommended SNF.  Patient and family prefers home with home health.  Cough and postnasal drainage -Flonase nasal spray and Claritin  Neuropathic pain: Previously on gabapentin.  -Gabapentin 300 mg a.m. and 600 mg at bedtime -Pyridoxine 50 mg  daily  Hypokalemia: Resolved.  Goal of care: Full code.  Now concern for recurrent GI stromal tumor.  Per general surgery not resectable.  -Appreciate palliative care input. Pressure Injury 01/09/20 Sacrum Mid Stage 2 -  Partial thickness loss of dermis presenting as a shallow open injury with a red, pink wound bed without slough. (Active)  01/09/20 (Received in report that this pressure ulcer was present on admission)   Location: Sacrum  Location Orientation: Mid  Staging: Stage 2 -  Partial thickness loss of dermis presenting as a shallow open injury with a red, pink wound bed without slough.  Wound Description (Comments):   Present on Admission: Yes               DVT prophylaxis: Continue subcu Lovenox Code Status: Full code Family Communication: Patient and RN.  Updated patient's daughter over the phone.  Discharge barrier: Work-up for GI stromal tumor.  Plan for EUS 2/11. Patient is from: Home Final disposition: Home with home health when cleared by GI, Gen surg, and oncology  Consultants: General surgery, GI, oncology and palliative care   Microbiology summarized: [id number] negative Influenza PCR negative  Sch Meds:  Scheduled Meds: . carvedilol  3.125 mg Oral BID WC  . enoxaparin (LOVENOX) injection  40 mg Subcutaneous Q24H  . fluticasone  2 spray Each Nare Daily  . gabapentin  300 mg Oral BID  . guaiFENesin  600 mg Oral BID  . imatinib  400 mg Oral Q breakfast  . levothyroxine  50 mcg Oral Q0600  . loratadine  10 mg Oral Daily  . pantoprazole (PROTONIX) IV  40 mg Intravenous Q24H  . vitamin B-6  50 mg Oral Daily   Continuous Infusions: . ferumoxytol     PRN Meds:.acetaminophen **OR** acetaminophen, alum & mag hydroxide-simeth, ipratropium-albuterol, labetalol, ondansetron **OR** ondansetron (ZOFRAN) IV, oxyCODONE, oxymetazoline  Antimicrobials: Anti-infectives (From admission, onward)   Start     Dose/Rate Route Frequency Ordered Stop   01/10/20 1400   cefTRIAXone (ROCEPHIN) 2 g in sodium chloride 0.9 % 100 mL IVPB  Status:  Discontinued     2 g 200 mL/hr over 30 Minutes Intravenous Every 24 hours 01/10/20 1213 01/13/20 0706   01/09/20 1830  ciprofloxacin (CIPRO) IVPB 400 mg  Status:  Discontinued     400 mg 200 mL/hr over 60 Minutes Intravenous Every 12 hours 01/09/20 0853 01/10/20 1213   01/09/20 1400  metroNIDAZOLE (FLAGYL) IVPB 500 mg  Status:  Discontinued     500 mg 100 mL/hr over 60 Minutes Intravenous Every 8 hours 01/09/20 0611 01/13/20 0706   01/09/20 0630  ciprofloxacin (CIPRO) IVPB 400 mg     400 mg 200 mL/hr over 60 Minutes Intravenous  Once 01/09/20 0616 01/09/20 0909   01/09/20 0445  cefTRIAXone (ROCEPHIN) 1 g in sodium chloride 0.9 % 100 mL IVPB     1 g 200 mL/hr over 30 Minutes Intravenous  Once 01/09/20 0432 01/09/20 0516   01/09/20 0445  metroNIDAZOLE (FLAGYL) IVPB 500 mg     500 mg 100 mL/hr over 60 Minutes Intravenous  Once 01/09/20 0432 01/09/20 5035  I have personally reviewed the following labs and images: CBC: Recent Labs  Lab 01/10/20 0300 01/11/20 0543 01/12/20 0316 01/13/20 0210 01/14/20 0221  WBC 12.6* 12.9* 13.4* 14.4* 14.6*  HGB 9.1* 8.7* 8.9* 8.5* 9.0*  HCT 29.6* 27.4* 28.6* 27.2* 28.7*  MCV 94.3 92.3 93.2 92.8 92.0  PLT 178 165 194 211 251   BMP &GFR Recent Labs  Lab 01/09/20 0131 01/10/20 0300 01/11/20 0543 01/12/20 0316 01/13/20 0210 01/14/20 0221  NA 141 141  --  138 139 139  K 4.0 4.0  --  3.5 3.4* 4.1  CL 107 110  --  107 107 108  CO2 23 21*  --  22 22 22   GLUCOSE 153* 86  --  89 97 106*  BUN 14 9  --  12 11 10   CREATININE 1.17* 1.10*  --  1.02* 1.00 0.91  CALCIUM 9.6 8.6*  --  8.8* 8.8* 9.1  MG  --  1.9 2.0 1.9 1.8 1.8  PHOS  --  3.8 2.8 3.0 2.9 2.7   Estimated Creatinine Clearance: 41.6 mL/min (by C-G formula based on SCr of 0.91 mg/dL). Liver & Pancreas: Recent Labs  Lab 01/09/20 0131 01/09/20 0131 01/10/20 0300 01/11/20 0543 01/12/20 0316 01/13/20 0210  01/14/20 0221  AST 405*  --  80* 34 23 18  --   ALT 140*  --  89* 55* 40 27  --   ALKPHOS 145*  --  109 91 95 83  --   BILITOT 1.3*  --  1.1 0.8 1.0 0.7  --   PROT 6.3*  --  5.1* 5.2* 5.3* 5.2*  --   ALBUMIN 3.2*   < > 2.4* 2.2* 2.2* 2.1* 2.1*   < > = values in this interval not displayed.   Recent Labs  Lab 01/09/20 0131 01/10/20 0300 01/11/20 0724 01/12/20 0316  LIPASE 2,582* 256* 53* 28   No results for input(s): AMMONIA in the last 168 hours. Diabetic: No results for input(s): HGBA1C in the last 72 hours. Recent Labs  Lab 01/13/20 0607 01/13/20 1221 01/13/20 1715 01/13/20 2338 01/14/20 0547  GLUCAP 84 129* 90 112* 85   Cardiac Enzymes: No results for input(s): CKTOTAL, CKMB, CKMBINDEX, TROPONINI in the last 168 hours. No results for input(s): PROBNP in the last 8760 hours. Coagulation Profile: Recent Labs  Lab 01/09/20 1501  INR 1.2   Thyroid Function Tests: No results for input(s): TSH, T4TOTAL, FREET4, T3FREE, THYROIDAB in the last 72 hours. Lipid Profile: No results for input(s): CHOL, HDL, LDLCALC, TRIG, CHOLHDL, LDLDIRECT in the last 72 hours. Anemia Panel: No results for input(s): VITAMINB12, FOLATE, FERRITIN, TIBC, IRON, RETICCTPCT in the last 72 hours. Urine analysis:    Component Value Date/Time   COLORURINE YELLOW 12/11/2019 1337   APPEARANCEUR CLEAR 12/11/2019 1337   LABSPEC 1.018 12/11/2019 1337   PHURINE 5.0 12/11/2019 1337   GLUCOSEU NEGATIVE 12/11/2019 1337   HGBUR NEGATIVE 12/11/2019 1337   BILIRUBINUR NEGATIVE 12/11/2019 1337   KETONESUR NEGATIVE 12/11/2019 1337   PROTEINUR 30 (A) 12/11/2019 1337   UROBILINOGEN 0.2 11/12/2010 2324   NITRITE NEGATIVE 12/11/2019 1337   LEUKOCYTESUR NEGATIVE 12/11/2019 1337   Sepsis Labs: Invalid input(s): PROCALCITONIN, [place]  Microbiology: Recent Results (from the past 240 hour(s))  Respiratory Panel by RT PCR (Flu A&B, Covid) - Nasopharyngeal Swab     Status: None   Collection Time:  01/09/20  4:16 AM   Specimen: Nasopharyngeal Swab  Result Value Ref Range Status   SARS Coronavirus  2 by RT PCR NEGATIVE NEGATIVE Final    Comment: (NOTE) SARS-CoV-2 target nucleic acids are NOT DETECTED. The SARS-CoV-2 RNA is generally detectable in upper respiratoy specimens during the acute phase of infection. The lowest concentration of SARS-CoV-2 viral copies this assay can detect is 131 copies/mL. A negative result does not preclude SARS-Cov-2 infection and should not be used as the sole basis for treatment or other patient management decisions. A negative result may occur with  improper specimen collection/handling, submission of specimen other than nasopharyngeal swab, presence of viral mutation(s) within the areas targeted by this assay, and inadequate number of viral copies (<131 copies/mL). A negative result must be combined with clinical observations, patient history, and epidemiological information. The expected result is Negative. Fact Sheet for Patients:  PinkCheek.be Fact Sheet for Healthcare Providers:  GravelBags.it This test is not yet ap proved or cleared by the [place] FDA and  has been authorized for detection and/or diagnosis of SARS-CoV-2 by FDA under an Emergency Use Authorization (EUA). This EUA will remain  in effect (meaning this test can be used) for the duration of the COVID-19 declaration under Section 564(b)(1) of the Act, 21 U.S.C. section 360bbb-3(b)(1), unless the authorization is terminated or revoked sooner.    Influenza A by PCR NEGATIVE NEGATIVE Final   Influenza B by PCR NEGATIVE NEGATIVE Final    Comment: (NOTE) The Xpert Xpress SARS-CoV-2/FLU/RSV assay is intended as an aid in  the diagnosis of influenza from Nasopharyngeal swab specimens and  should not be used as a sole basis for treatment. Nasal washings and  aspirates are unacceptable for Xpert Xpress SARS-CoV-2/FLU/RSV    testing. Fact Sheet for Patients: PinkCheek.be Fact Sheet for Healthcare Providers: GravelBags.it This test is not yet approved or cleared by the [place] FDA and  has been authorized for detection and/or diagnosis of SARS-CoV-2 by  FDA under an Emergency Use Authorization (EUA). This EUA will remain  in effect (meaning this test can be used) for the duration of the  Covid-19 declaration under Section 564(b)(1) of the Act, 21  U.S.C. section 360bbb-3(b)(1), unless the authorization is  terminated or revoked. Performed at [facility], [place] [address] [place], [place] [address]     Radiology Studies: ECHOCARDIOGRAM COMPLETE  Result Date: 01/13/2020    ECHOCARDIOGRAM REPORT   Patient Name:   Monique Lin Date of Exam: 01/13/2020 Medical Rec #:  [id number]    Height:       62.5 in Accession #:    [id number]   Weight:       182.5 lb Date of Birth:  Apr 09, 1927     BSA:          1.85 m Patient Age:    44 years     BP:           155/70 mmHg Patient Gender: F            HR:           93 bpm. Exam Location:  Inpatient Procedure: 2D Echo Indications:     Chemotherapy evaluation v87.41 / v58.11  History:         Patient has prior history of Echocardiogram examinations, most                  recent 04/12/2018. Risk Factors:Hypertension. Thyroid disease.                  Anemia. Recent cardiac arrest. Acute kidney injury.  Sonographer:  Darlina Sicilian RDCS Referring Phys:  [place] Phys: [name] MD IMPRESSIONS  1. Left ventricular ejection fraction, by estimation, is 55 to 60%. The left ventricle has normal function. The left ventrical has no regional wall motion abnormalities. There is mildly increased left ventricular hypertrophy. Left ventricular diastolic parameters are consistent with Grade II diastolic dysfunction (pseudonormalization).  2. Right ventricular systolic function is normal. The right ventricular  size is normal.  3. Trivial mitral valve regurgitation.  4. The aortic valve is tricuspid. Aortic valve regurgitation is not visualized. Mild aortic valve sclerosis is present, with no evidence of aortic valve stenosis.  5. The inferior vena cava is normal in size with greater than 50% respiratory variability, suggesting right atrial pressure of 3 mmHg. FINDINGS  Left Ventricle: Left ventricular ejection fraction, by estimation, is 55 to 60%. The left ventricle has normal function. The left ventricle has no regional wall motion abnormalities. The left ventricular internal cavity size was normal in size. There is  mildly increased left ventricular hypertrophy. Concentric left ventricular hypertrophy. Left ventricular diastolic parameters are consistent with Grade II diastolic dysfunction (pseudonormalization). Right Ventricle: The right ventricular size is normal. No increase in right ventricular wall thickness. Right ventricular systolic function is normal. Left Atrium: Left atrial size was normal in size. Right Atrium: Right atrial size was normal in size. Pericardium: Trivial pericardial effusion is present. The pericardial effusion is circumferential. There is no evidence of cardiac tamponade. Mitral Valve: The mitral valve is normal in structure and function. Trivial mitral valve regurgitation. Tricuspid Valve: The tricuspid valve is normal in structure. Tricuspid valve regurgitation is not demonstrated. Aortic Valve: The aortic valve is tricuspid. Aortic valve regurgitation is not visualized. Mild aortic valve sclerosis is present, with no evidence of aortic valve stenosis. There is moderate calcification of the aortic valve. Pulmonic Valve: The pulmonic valve was normal in structure. Pulmonic valve regurgitation is trivial. Aorta: The aortic root and ascending aorta are structurally normal, with no evidence of dilitation. Venous: The inferior vena cava was not well visualized. The inferior vena cava is normal  in size with greater than 50% respiratory variability, suggesting right atrial pressure of 3 mmHg. IAS/Shunts: The interatrial septum was not assessed.  LEFT VENTRICLE PLAX 2D LVIDd:         3.20 cm  Diastology LVIDs:         2.30 cm  LV e' lateral:   7.29 cm/s LV PW:         1.00 cm  LV E/e' lateral: 12.0 LV IVS:        1.10 cm  LV e' medial:    5.77 cm/s LVOT diam:     1.80 cm  LV E/e' medial:  15.2 LV SV:         56.24 ml LV SV Index:   11.74 LVOT Area:     2.54 cm  RIGHT VENTRICLE RV S prime:     19.90 cm/s TAPSE (M-mode): 2.4 cm LEFT ATRIUM         Index LA diam:    2.90 cm 1.57 cm/m  AORTIC VALVE LVOT Vmax:   109.00 cm/s LVOT Vmean:  75.600 cm/s LVOT VTI:    0.221 m  AORTA Ao Root diam: 3.00 cm Ao Asc diam:  3.30 cm MITRAL VALVE MV Area (PHT): 4.60 cm             SHUNTS MV Decel Time: 165 msec  Systemic VTI:  0.22 m MV E velocity: 87.70 cm/s 103 cm/s  Systemic Diam: 1.80 cm MV A velocity: 78.80 cm/s 70.3 cm/s MV E/A ratio:  1.11       1.5 [name] MD Electronically signed by [name] MD Signature Date/Time: 01/13/2020/1:01:49 PM    Final       Shalissa Easterwood T. [place]  If 7PM-7AM, please contact night-coverage [email or web address] Password [email or web address] 01/14/2020, 10:06 AM

## 2020-01-14 NOTE — Progress Notes (Signed)
Physical Therapy Treatment Patient Details Name: Monique Lin MRN: [id number] DOB: March 19, 1927 Today's Date: 01/14/2020    History of Present Illness 86 y.o. female with history of GI stromal tumor status post resection in 2011 with history of hypertension hypothyroidism who was recently admitted for possible cardiac arrest requiring CPR for 1 minute at this time patient was found to be in anemia was transfused and discharged home started experiencing severe epigastric pain since yesterday. CT scan showing necrotic gastric tumor and also CBD stone with pancreatitis. 2/6 GI noted pt improved and likely passed the stone. To have EGD for biopsy of tumor 2/8    PT Comments    Patient received in bed, willing to participate but anxious in general today. Able to encourage her to get OOB and mobilize to [facility] with MinAx2 and RW, spent quite a bit of time sitting on commode due to copious amount of liquid stools. Needed totalA for pericare.Able to take 3-4 steps forward from commode with RW but very shaky and tremulous- unable to progress gait today due to fatigue. She was left up in the chair with alarm active and all needs otherwise met this morning. Will continue to follow acutely.     Follow Up Recommendations  Home health PT;Supervision/Assistance - 24 hour     Equipment Recommendations  None recommended by PT    Recommendations for Other Services       Precautions / Restrictions Precautions Precautions: Fall Restrictions Weight Bearing Restrictions: No    Mobility  Bed Mobility Overal bed mobility: Needs Assistance Bed Mobility: Supine to Sit     Supine to sit: Min assist     General bed mobility comments: MinA and extended time and effort to get to EOB, lots of verbal encouragement  Transfers Overall transfer level: Needs assistance Equipment used: Rolling walker (2 wheeled) Transfers: Sit to/from [facility] Sit to Stand: Min assist;+2 physical  assistance Stand pivot transfers: Min assist;+2 physical assistance       General transfer comment: MinAx2 for safety due to gross weakness and quick to fatigue, cues for safety and hand placement  Ambulation/Gait             General Gait Details: deferred- fatigue   Stairs             Wheelchair Mobility    Modified Rankin (Stroke Patients Only)       Balance Overall balance assessment: Needs assistance Sitting-balance support: Single extremity supported Sitting balance-Leahy Scale: Good Sitting balance - Comments: S for safety   Standing balance support: Bilateral upper extremity supported;During functional activity Standing balance-Leahy Scale: Poor Standing balance comment: reliant on UE support                            Cognition Arousal/Alertness: Awake/alert Behavior During Therapy: WFL for tasks assessed/performed;Anxious Overall Cognitive Status: Within Functional Limits for tasks assessed                                 General Comments: anxious regarding mobility and upset about many things today, provided supportive listening and discussion. Cues for safety and needs lots of encouragement for mobility.      Exercises      General Comments General comments (skin integrity, edema, etc.): fatigues very quickly, needs lots of encouragement      Pertinent Vitals/Pain Pain Assessment: Faces Faces Pain Scale:  Hurts a little bit Pain Location: generalized  Pain Descriptors / Indicators: Discomfort;Aching Pain Intervention(s): Limited activity within patient's tolerance;Monitored during session    Home Living                      Prior Function            PT Goals (current goals can now be found in the care plan section) Acute Rehab PT Goals Patient Stated Goal: to feel better  PT Goal Formulation: With patient/family Time For Goal Achievement: 01/25/20 Potential to Achieve Goals: Good Progress towards  PT goals: Progressing toward goals    Frequency    Min 3X/week      PT Plan Current plan remains appropriate    Co-evaluation PT/OT/SLP Co-Evaluation/Treatment: Yes Reason for Co-Treatment: For patient/therapist safety;To address functional/ADL transfers PT goals addressed during session: Mobility/safety with mobility;Proper use of DME        AM-PAC PT "6 Clicks" Mobility   Outcome Measure  Help needed turning from your back to your side while in a flat bed without using bedrails?: A Little Help needed moving from lying on your back to sitting on the side of a flat bed without using bedrails?: A Little Help needed moving to and from a bed to a chair (including a wheelchair)?: A Little Help needed standing up from a chair using your arms (e.g., wheelchair or bedside chair)?: A Little Help needed to walk in hospital room?: A Lot Help needed climbing 3-5 steps with a railing? : A Lot 6 Click Score: 16    End of Session   Activity Tolerance: Patient limited by fatigue Patient left: in chair;with call bell/phone within reach;with chair alarm set Nurse Communication: Mobility status PT Visit Diagnosis: Muscle weakness (generalized) (M62.81);Unsteadiness on feet (R26.81)     Time: 1100-1147 PT Time Calculation (min) (ACUTE ONLY): 47 min  Charges:  $Therapeutic Activity: 23-37 mins(co-tx with OT)                     Windell Norfolk, DPT, PN1   Supplemental Physical Therapist [place]    Pager [phone] Acute Rehab Office [phone]

## 2020-01-14 NOTE — Progress Notes (Signed)
Occupational Therapy Treatment Patient Details Name: Monique Lin MRN: [id number] DOB: 1926-12-11 Today's Date: 01/14/2020    History of present illness 86 y.o. female with history of GI stromal tumor status post resection in 2011 with history of hypertension hypothyroidism who was recently admitted for possible cardiac arrest requiring CPR for 1 minute at this time patient was found to be in anemia was transfused and discharged home started experiencing severe epigastric pain since yesterday. CT scan showing necrotic gastric tumor and also CBD stone with pancreatitis. 2/6 GI noted pt improved and likely passed the stone. To have EGD for biopsy of tumor 2/8   OT comments  Patient progressing slowly towards goals.  Patient requires increased time and encouragement during session, anxious.  Completing toilet transfers with min assist +2 using RW to [facility], total assist for toileting after increased time for liquid BM. Fatigues easily.  Will follow acutely.    Follow Up Recommendations  Home health OT;Supervision/Assistance - 24 hour    Equipment Recommendations  3 in 1 bedside commode    Recommendations for Other Services      Precautions / Restrictions Precautions Precautions: Fall Restrictions Weight Bearing Restrictions: No       Mobility Bed Mobility Overal bed mobility: Needs Assistance Bed Mobility: Supine to Sit     Supine to sit: Min assist     General bed mobility comments: min assist to manage R LE and scooting hips towards EOB, increased time and effort   Transfers Overall transfer level: Needs assistance Equipment used: Rolling walker (2 wheeled) Transfers: Sit to/from [facility] Sit to Stand: Min assist;+2 physical assistance Stand pivot transfers: Min assist;+2 physical assistance       General transfer comment: MinAx2 for safety due to gross weakness and quick to fatigue, cues for safety and hand placement    Balance Overall balance  assessment: Needs assistance Sitting-balance support: Single extremity supported Sitting balance-Leahy Scale: Good Sitting balance - Comments: S for safety   Standing balance support: Bilateral upper extremity supported;During functional activity Standing balance-Leahy Scale: Poor Standing balance comment: reliant on UE and external support                           ADL either performed or assessed with clinical judgement   ADL Overall ADL's : Needs assistance/impaired     Grooming: Set up;Sitting               Lower Body Dressing: Total assistance;Sit to/from stand Lower Body Dressing Details (indicate cue type and reason): assist to adjust socks, min assist +2 sit to stand  Toilet Transfer: Minimal assistance;+2 for physical assistance   Toileting- Clothing Manipulation and Hygiene: Total assistance;+2 for physical assistance;Sit to/from stand       Functional mobility during ADLs: Minimal assistance;+2 for physical assistance;Rolling walker;Cueing for safety;Cueing for sequencing       Vision       Perception     Praxis      Cognition Arousal/Alertness: Awake/alert Behavior During Therapy: WFL for tasks assessed/performed;Anxious Overall Cognitive Status: Within Functional Limits for tasks assessed                                 General Comments: anxious regarding mobility and upset about many things today, provided supportive listening and discussion. Cues for safety and needs lots of encouragement for mobility.  Exercises     Shoulder Instructions       General Comments fatigues very quickly, needs lots of encouragement    Pertinent Vitals/ Pain       Pain Assessment: Faces Faces Pain Scale: Hurts a little bit Pain Location: generalized  Pain Descriptors / Indicators: Discomfort;Aching Pain Intervention(s): Limited activity within patient's tolerance;Monitored during session;Repositioned  Home Living                                           Prior Functioning/Environment              Frequency  Min 2X/week        Progress Toward Goals  OT Goals(current goals can now be found in the care plan section)  Progress towards OT goals: Progressing toward goals  Acute Rehab OT Goals Patient Stated Goal: to feel better  OT Goal Formulation: With patient  Plan Discharge plan remains appropriate;Frequency remains appropriate    Co-evaluation    PT/OT/SLP Co-Evaluation/Treatment: Yes Reason for Co-Treatment: For patient/therapist safety;To address functional/ADL transfers PT goals addressed during session: Mobility/safety with mobility;Proper use of DME OT goals addressed during session: ADL's and self-care      AM-PAC OT "6 Clicks" Daily Activity     Outcome Measure   Help from another person eating meals?: A Little Help from another person taking care of personal grooming?: A Little Help from another person toileting, which includes using toliet, bedpan, or urinal?: Total Help from another person bathing (including washing, rinsing, drying)?: A Lot Help from another person to put on and taking off regular upper body clothing?: A Little Help from another person to put on and taking off regular lower body clothing?: Total 6 Click Score: 13    End of Session Equipment Utilized During Treatment: Rolling walker  OT Visit Diagnosis: Muscle weakness (generalized) (M62.81);Other abnormalities of gait and mobility (R26.89)   Activity Tolerance Patient tolerated treatment well   Patient Left in chair;with call bell/phone within reach;with chair alarm set   Nurse Communication Mobility status        Time: 1100-1150 OT Time Calculation (min): 50 min  Charges: OT General Charges $OT Visit: 1 Visit OT Treatments $Self Care/Home Management : 8-22 mins  Jolaine Artist, OT [place] Pager [phone] Office [phone]    Delight Stare 01/14/2020, 1:09 PM

## 2020-01-14 NOTE — Progress Notes (Signed)
Explained the nature, purpose, and risks of tomorrow's anticipated EUS with FNA with the patient and her daughter, who was at the bedside.  She is agreeable to proceed.    I explained the difference between this and the standard endoscopy with biopsies.  Questions answered.  Also spoke with Dr. Burney Gauze about our plans.  I am hopeful that we will have tissue results by Friday.  Cleotis Nipper, M.D. Pager [phone] If no answer or after 5 PM call [phone]

## 2020-01-14 NOTE — Progress Notes (Signed)
Patient ID: Monique Lin, female   DOB: [birth year], 86 y.o.   MRN: [id number]    2 Days Post-Op  Subjective: Patient c/o being wet this morning at the [place] messed up.  Didn't eat much yesterday because her food got cold.  Doesn't complain of abdominal pain  ROS: See above, otherwise other systems negative  Objective: Vital signs in last 24 hours: Temp:  [98.1 F (36.7 C)-98.8 F (37.1 C)] 98.6 F (37 C) (02/10 0801) Pulse Rate:  [86-96] 96 (02/10 0801) Resp:  [16-20] 16 (02/10 0801) BP: (150-164)/(62-71) 164/71 (02/10 0801) SpO2:  [96 %-99 %] 98 % (02/10 0801) Weight:  [83.6 kg] 83.6 kg (02/10 0500) Last BM Date: 01/13/20  Intake/Output from previous day: No intake/output data recorded. Intake/Output this shift: No intake/output data recorded.  PE: Heart: regular Lungs: CTAB Abd: soft, essentially NT today, ND, +BS Psych: A&O x3, but doesn't seem to totally grasp all aspects of what is going on with her right now  Lab Results:  Recent Labs    01/13/20 0210 01/14/20 0221  WBC 14.4* 14.6*  HGB 8.5* 9.0*  HCT 27.2* 28.7*  PLT 211 251   BMET Recent Labs    01/13/20 0210 01/14/20 0221  NA 139 139  K 3.4* 4.1  CL 107 108  CO2 22 22  GLUCOSE 97 106*  BUN 11 10  CREATININE 1.00 0.91  CALCIUM 8.8* 9.1   PT/INR No results for input(s): LABPROT, INR in the last 72 hours. CMP     Component Value Date/Time   NA 139 01/14/2020 0221   K 4.1 01/14/2020 0221   CL 108 01/14/2020 0221   CO2 22 01/14/2020 0221   GLUCOSE 106 (H) 01/14/2020 0221   BUN 10 01/14/2020 0221   CREATININE 0.91 01/14/2020 0221   CALCIUM 9.1 01/14/2020 0221   PROT 5.2 (L) 01/13/2020 0210   ALBUMIN 2.1 (L) 01/14/2020 0221   AST 18 01/13/2020 0210   ALT 27 01/13/2020 0210   ALKPHOS 83 01/13/2020 0210   BILITOT 0.7 01/13/2020 0210   GFRNONAA 56 (L) 01/14/2020 0221   GFRAA >60 01/14/2020 0221   Lipase     Component Value Date/Time   LIPASE 28 01/12/2020 0316        Studies/Results: ECHOCARDIOGRAM COMPLETE  Result Date: 01/13/2020    ECHOCARDIOGRAM REPORT   Patient Name:   Monique Lin Date of Exam: 01/13/2020 Medical Rec #:  [id number]    Height:       62.5 in Accession #:    [id number]   Weight:       182.5 lb Date of Birth:  1927/09/26     BSA:          1.85 m Patient Age:    31 years     BP:           155/70 mmHg Patient Gender: F            HR:           93 bpm. Exam Location:  Inpatient Procedure: 2D Echo Indications:     Chemotherapy evaluation v87.41 / v58.11  History:         Patient has prior history of Echocardiogram examinations, most                  recent 04/12/2018. Risk Factors:Hypertension. Thyroid disease.                  Anemia. Recent cardiac arrest. Acute kidney  injury.  Sonographer:     Darlina Sicilian RDCS Referring Phys:  [place] Phys: [name] MD IMPRESSIONS  1. Left ventricular ejection fraction, by estimation, is 55 to 60%. The left ventricle has normal function. The left ventrical has no regional wall motion abnormalities. There is mildly increased left ventricular hypertrophy. Left ventricular diastolic parameters are consistent with Grade II diastolic dysfunction (pseudonormalization).  2. Right ventricular systolic function is normal. The right ventricular size is normal.  3. Trivial mitral valve regurgitation.  4. The aortic valve is tricuspid. Aortic valve regurgitation is not visualized. Mild aortic valve sclerosis is present, with no evidence of aortic valve stenosis.  5. The inferior vena cava is normal in size with greater than 50% respiratory variability, suggesting right atrial pressure of 3 mmHg. FINDINGS  Left Ventricle: Left ventricular ejection fraction, by estimation, is 55 to 60%. The left ventricle has normal function. The left ventricle has no regional wall motion abnormalities. The left ventricular internal cavity size was normal in size. There is  mildly increased left ventricular  hypertrophy. Concentric left ventricular hypertrophy. Left ventricular diastolic parameters are consistent with Grade II diastolic dysfunction (pseudonormalization). Right Ventricle: The right ventricular size is normal. No increase in right ventricular wall thickness. Right ventricular systolic function is normal. Left Atrium: Left atrial size was normal in size. Right Atrium: Right atrial size was normal in size. Pericardium: Trivial pericardial effusion is present. The pericardial effusion is circumferential. There is no evidence of cardiac tamponade. Mitral Valve: The mitral valve is normal in structure and function. Trivial mitral valve regurgitation. Tricuspid Valve: The tricuspid valve is normal in structure. Tricuspid valve regurgitation is not demonstrated. Aortic Valve: The aortic valve is tricuspid. Aortic valve regurgitation is not visualized. Mild aortic valve sclerosis is present, with no evidence of aortic valve stenosis. There is moderate calcification of the aortic valve. Pulmonic Valve: The pulmonic valve was normal in structure. Pulmonic valve regurgitation is trivial. Aorta: The aortic root and ascending aorta are structurally normal, with no evidence of dilitation. Venous: The inferior vena cava was not well visualized. The inferior vena cava is normal in size with greater than 50% respiratory variability, suggesting right atrial pressure of 3 mmHg. IAS/Shunts: The interatrial septum was not assessed.  LEFT VENTRICLE PLAX 2D LVIDd:         3.20 cm  Diastology LVIDs:         2.30 cm  LV e' lateral:   7.29 cm/s LV PW:         1.00 cm  LV E/e' lateral: 12.0 LV IVS:        1.10 cm  LV e' medial:    5.77 cm/s LVOT diam:     1.80 cm  LV E/e' medial:  15.2 LV SV:         56.24 ml LV SV Index:   11.74 LVOT Area:     2.54 cm  RIGHT VENTRICLE RV S prime:     19.90 cm/s TAPSE (M-mode): 2.4 cm LEFT ATRIUM         Index LA diam:    2.90 cm 1.57 cm/m  AORTIC VALVE LVOT Vmax:   109.00 cm/s LVOT Vmean:   75.600 cm/s LVOT VTI:    0.221 m  AORTA Ao Root diam: 3.00 cm Ao Asc diam:  3.30 cm MITRAL VALVE MV Area (PHT): 4.60 cm             SHUNTS MV Decel Time: 165 msec  Systemic VTI:  0.22 m MV E velocity: 87.70 cm/s 103 cm/s  Systemic Diam: 1.80 cm MV A velocity: 78.80 cm/s 70.3 cm/s MV E/A ratio:  1.11       1.5 [name] MD Electronically signed by [name] MD Signature Date/Time: 01/13/2020/1:01:49 PM    Final     Anti-infectives: Anti-infectives (From admission, onward)   Start     Dose/Rate Route Frequency Ordered Stop   01/10/20 1400  cefTRIAXone (ROCEPHIN) 2 g in sodium chloride 0.9 % 100 mL IVPB  Status:  Discontinued     2 g 200 mL/hr over 30 Minutes Intravenous Every 24 hours 01/10/20 1213 01/13/20 0706   01/09/20 1830  ciprofloxacin (CIPRO) IVPB 400 mg  Status:  Discontinued     400 mg 200 mL/hr over 60 Minutes Intravenous Every 12 hours 01/09/20 0853 01/10/20 1213   01/09/20 1400  metroNIDAZOLE (FLAGYL) IVPB 500 mg  Status:  Discontinued     500 mg 100 mL/hr over 60 Minutes Intravenous Every 8 hours 01/09/20 0611 01/13/20 0706   01/09/20 0630  ciprofloxacin (CIPRO) IVPB 400 mg     400 mg 200 mL/hr over 60 Minutes Intravenous  Once 01/09/20 0616 01/09/20 0909   01/09/20 0445  cefTRIAXone (ROCEPHIN) 1 g in sodium chloride 0.9 % 100 mL IVPB     1 g 200 mL/hr over 30 Minutes Intravenous  Once 01/09/20 0432 01/09/20 0516   01/09/20 0445  metroNIDAZOLE (FLAGYL) IVPB 500 mg     500 mg 100 mL/hr over 60 Minutes Intravenous  Once 01/09/20 0432 01/09/20 1552       Assessment/Plan Gallstone Pancreatitis - resolved now clinically and chemically, question is what to do long term in 86 yo with recurrent unresectable gist. Options are just follow her, surgery, or ercp with sphincterotomy which might be most reasonable option.  -no plans for surgical intervention at this time though.  Recurrent GIST - not resectable, EGD completed and biopsies negative as lesion is  submucosal and biopsies were of the mucosa.  GI plans for EUS with further characterization of the mass as well as fine needle sampling for better biopsies.  Oncology saw her and suggested initiation of Gleevac. -? ERCP at time of EUS for sphincterotomy to minimize any further risk of recurrent gallstone pancreatitis given poor surgical candidate.  FEN - soft diet VTE - Lovenox ID - none currently needed   LOS: 5 days    Henreitta Cea , [facility] Surgery 01/14/2020, 9:17 AM Please see Amion for pager number during day hours 7:00am-4:30pm or 7:00am -11:30am on weekends

## 2020-01-15 ENCOUNTER — Encounter (HOSPITAL_COMMUNITY)
Admission: EM | Disposition: A | Discharge status: Home Health | Payer: Self-pay | Source: Home / Self Care | Attending: Internal Medicine

## 2020-01-15 ENCOUNTER — Inpatient Hospital Stay (HOSPITAL_COMMUNITY): Payer: Medicare Other

## 2020-01-15 ENCOUNTER — Encounter (HOSPITAL_COMMUNITY): Payer: Self-pay | Admitting: Internal Medicine

## 2020-01-15 HISTORY — PX: EUS: SHX5427

## 2020-01-15 HISTORY — PX: ESOPHAGOGASTRODUODENOSCOPY (EGD) WITH PROPOFOL: SHX5813

## 2020-01-15 HISTORY — PX: FINE NEEDLE ASPIRATION: SHX5430

## 2020-01-15 LAB — RENAL FUNCTION PANEL
Albumin: 2.1 g/dL — ABNORMAL LOW (ref 3.5–5.0)
Anion gap: 8 (ref 5–15)
BUN: 10 mg/dL (ref 8–23)
CO2: 23 mmol/L (ref 22–32)
Calcium: 9.1 mg/dL (ref 8.9–10.3)
Chloride: 107 mmol/L (ref 98–111)
Creatinine, Ser: 1.22 mg/dL — ABNORMAL HIGH (ref 0.44–1.00)
GFR calc Af Amer: 45 mL/min — ABNORMAL LOW (ref >60)
GFR calc non Af Amer: 39 mL/min — ABNORMAL LOW (ref >60)
Glucose, Bld: 104 mg/dL — ABNORMAL HIGH (ref 70–99)
Phosphorus: 2.9 mg/dL (ref 2.5–4.6)
Potassium: 4 mmol/L (ref 3.5–5.1)
Sodium: 138 mmol/L (ref 135–145)

## 2020-01-15 LAB — GLUCOSE, CAPILLARY
Glucose-Capillary: 112 mg/dL — ABNORMAL HIGH (ref 70–99)
Glucose-Capillary: 117 mg/dL — ABNORMAL HIGH (ref 70–99)
Glucose-Capillary: 98 mg/dL (ref 70–99)

## 2020-01-15 LAB — CBC
HCT: 28.1 % — ABNORMAL LOW (ref 36.0–46.0)
Hemoglobin: 8.8 g/dL — ABNORMAL LOW (ref 12.0–15.0)
MCH: 29 pg (ref 26.0–34.0)
MCHC: 31.3 g/dL (ref 30.0–36.0)
MCV: 92.7 fL (ref 80.0–100.0)
Platelets: 278 10*3/uL (ref 150–400)
RBC: 3.03 MIL/uL — ABNORMAL LOW (ref 3.87–5.11)
RDW: 18.1 % — ABNORMAL HIGH (ref 11.5–15.5)
WBC: 13.8 10*3/uL — ABNORMAL HIGH (ref 4.0–10.5)
nRBC: 0.2 % (ref 0.0–0.2)

## 2020-01-15 LAB — MAGNESIUM: Magnesium: 1.8 mg/dL (ref 1.7–2.4)

## 2020-01-15 SURGERY — ESOPHAGOGASTRODUODENOSCOPY (EGD) WITH PROPOFOL
Anesthesia: Monitor Anesthesia Care

## 2020-01-15 MED ORDER — LIDOCAINE 2% (20 MG/ML) 5 ML SYRINGE
INTRAMUSCULAR | Status: DC | PRN
  Administered 2020-01-15: 60 mg via INTRAVENOUS

## 2020-01-15 MED ORDER — TRAZODONE HCL 50 MG PO TABS
50.0000 mg | ORAL_TABLET | Freq: Once | ORAL | Status: AC
  Administered 2020-01-15: 50 mg via ORAL
  Filled 2020-01-15: qty 1

## 2020-01-15 MED ORDER — PROPOFOL 10 MG/ML IV BOLUS
INTRAVENOUS | Status: DC | PRN
  Administered 2020-01-15: 30 mg via INTRAVENOUS

## 2020-01-15 MED ORDER — LACTATED RINGERS IV SOLN: INTRAVENOUS | Status: DC

## 2020-01-15 MED ORDER — PROPOFOL 500 MG/50ML IV EMUL
INTRAVENOUS | Status: DC | PRN
  Administered 2020-01-15: 100 ug/kg/min via INTRAVENOUS

## 2020-01-15 SURGICAL SUPPLY — 15 items

## 2020-01-15 NOTE — Progress Notes (Signed)
Daughter called back. Updated on projected transport to procedure time for pt. States will come up to be with pt before surgery, that was allowed to do so on Monday. Wanted to know why surgery did not call her prior to inform her of information, unable to answer that question for daughter. While on the phone and with patient, patient informed daughter that her mouth was dry and she couldn't hardly talk. Daughter preferred from mom mouth to be swabbed. Daughter states on her way. Patient needing repositioning in bed. Informed patient we would reposition her. Asked ptt if she had any issues with choking-pt stated bno issues unless she's all the way down in bed. Pt informed will get staff to help assist.Speech therapy came into room and assisted with repositioning. Pt mouth cleaned. Pt covered back up d/t her stating she gets cold quick anmd through the hallways.

## 2020-01-15 NOTE — Progress Notes (Signed)
Patient is scheduled today for EUS with FNA by GI.  We will await bx results, but once again this gastric mass is not resectable.  Given her age, along with this gastric mass, etc, we would still likely tend to not recommend a lap chole for her gallstone pancreatitis.  We recommend ERCP at the time of EUS, if GI was agreeable, for a sphincterotomy to minimize patient's risk in the future for recurrent pancreatitis.  We will continue to follow.  Henreitta Cea 7:52 AM 01/15/2020

## 2020-01-15 NOTE — Anesthesia Preprocedure Evaluation (Addendum)
Anesthesia Evaluation  Patient identified by MRN, date of birth, ID band Patient awake    Reviewed: Allergy & Precautions, NPO status , Patient's Chart, lab work & pertinent test results  History of Anesthesia Complications Negative for: history of anesthetic complications  Airway Mallampati: III  TM Distance: >3 FB Neck ROM: Full    Dental  (+) Dental Advisory Given   Pulmonary neg recent URI,     + decreased breath sounds      Cardiovascular hypertension, Pt. on medications  Rhythm:Regular     Neuro/Psych negative neurological ROS  negative psych ROS   GI/Hepatic GERD  Medicated,gastric mass   Endo/Other  Hypothyroidism   Renal/GU ARF and Renal InsufficiencyRenal disease     Musculoskeletal   Abdominal   Peds  Hematology  (+) anemia ,   Anesthesia Other Findings 1. Left ventricular ejection fraction, by estimation, is 55 to 60%. The  left ventricle has normal function. The left ventrical has no regional  wall motion abnormalities. There is mildly increased left ventricular  hypertrophy. Left ventricular diastolic  parameters are consistent with Grade II diastolic dysfunction  (pseudonormalization).  2. Right ventricular systolic function is normal. The right ventricular  size is normal.  3. Trivial mitral valve regurgitation.  4. The aortic valve is tricuspid. Aortic valve regurgitation is not  visualized. Mild aortic valve sclerosis is present, with no evidence of  aortic valve stenosis.  5. The inferior vena cava is normal in size with greater than 50%  respiratory variability, suggesting right atrial pressure of 3 mmHg.   Reproductive/Obstetrics                            Anesthesia Physical Anesthesia Plan  ASA: III  Anesthesia Plan: MAC   Post-op Pain Management:    Induction: Intravenous  PONV Risk Score and Plan: 2 and Treatment may vary due to age or medical  condition and Propofol infusion  Airway Management Planned: Nasal Cannula  Additional Equipment: None  Intra-op Plan:   Post-operative Plan:   Informed Consent: I have reviewed the patients History and Physical, chart, labs and discussed the procedure including the risks, benefits and alternatives for the proposed anesthesia with the patient or authorized representative who has indicated his/her understanding and acceptance.     Dental advisory given  Plan Discussed with: CRNA and Surgeon  Anesthesia Plan Comments:         Anesthesia Quick Evaluation

## 2020-01-15 NOTE — Op Note (Signed)
[facility] Patient Name: Monique Lin Procedure Date : 01/15/2020 MRN: [id number] Attending MD: Arta Silence , MD Date of Birth: [birth year] CSN: [id number] Age: 86 Admit Type: Inpatient Procedure:                Upper EUS Indications:              Suspected gastric neoplasm, Submucosal tumor versus                            extrinsic mass found on endoscopy Providers:                Arta Silence, MD, Carlyn Reichert, RN, Lazaro Arms,                            Technician, Corie Chiquito, Technician, Lerry Paterson,                            CRNA Referring MD:              Medicines:                Monitored Anesthesia Care Complications:            No immediate complications. Estimated Blood Loss:     Estimated blood loss was minimal. Procedure:                Pre-Anesthesia Assessment:                           - Prior to the procedure, a History and Physical                            was performed, and patient medications and                            allergies were reviewed. The patient's tolerance of                            previous anesthesia was also reviewed. The risks                            and benefits of the procedure and the sedation                            options and risks were discussed with the patient.                            All questions were answered, and informed consent                            was obtained. Prior Anticoagulants: The patient has                            taken Lovenox (enoxaparin), last dose was 1 day  prior to procedure. ASA Grade Assessment: III - A                            patient with severe systemic disease. After                            reviewing the risks and benefits, the patient was                            deemed in satisfactory condition to undergo the                            procedure.                           After obtaining informed consent, the endoscope was                     passed under direct vision. Throughout the                            procedure, the patient's blood pressure, pulse, and                            oxygen saturations were monitored continuously. The                            [id number] ([id number]) Olympus Linear EUS scope was                            introduced through the mouth, and advanced to the                            antrum of the stomach. The upper EUS was                            accomplished without difficulty. The patient                            tolerated the procedure well. Scope In: Scope Out: Findings:      ENDOSCOPIC FINDING: :      large ulcerated submucosal lesion in proximal stomach with central       ulceration.      ENDOSONOGRAPHIC FINDING: :      A round intramural (subepithelial) lesion was found in the cardia of the       stomach. The lesion was hypoechoic, calcified and lobulated.       Sonographically, the lesion appeared to originate from the muscularis       propria (Layer 4). The lesion also appeared to involve the following       wall layer(s): intramural wall, but the wall layer could not be       determined. The lesion also measured 11 mm by 10 mm in diameter. The       outer endosonographic borders were poorly defined. Fine needle       aspiration for cytology was performed. Color Doppler  imaging was       utilized prior to needle puncture to confirm a lack of significant       vascular structures within the needle path. Four passes were made with       the 22 gauge needle using a transgastric approach. A stylet was used. A       preliminary cytologic examination was not performed. Final cytology       results are pending. Impression:               - An intramural (subepithelial) lesion was found in                            the cardia of the stomach. The lesion appeared to                            originate from within the muscularis propria (Layer                             4). Fine needle aspiration performed. Recommendation:           - Return patient to hospital ward for ongoing care.                           - Clear liquid diet today.                           - Continue present medications.                           - Await cytology results.                           [facility] will follow. Procedure Code(s):        --- Professional ---                           [phone], Esophagogastroduodenoscopy, flexible,                            transoral; with transendoscopic ultrasound-guided                            intramural or transmural fine needle                            aspiration/biopsy(s) (includes endoscopic                            ultrasound examination of the esophagus, stomach,                            and either the duodenum or a surgically altered                            stomach where the jejunum is examined distal to the  anastomosis) Diagnosis Code(s):        --- Professional ---                           K31.89, Other diseases of stomach and duodenum                           K92.9, Disease of digestive system, unspecified CPT copyright 2019 American Medical Association. All rights reserved. The codes documented in this report are preliminary and upon coder review may  be revised to meet current compliance requirements. Arta Silence, MD 01/15/2020 11:28:03 AM This report has been signed electronically. Number of Addenda: 0

## 2020-01-15 NOTE — Progress Notes (Signed)
PROGRESS NOTE  Monique Lin QQV:[id number] DOB: March 16, 1927   PCP: [name], MD  Patient is from: Home.  Uses walker and Rollator at baseline.  DOA: 01/09/2020 LOS: 6  Brief Narrative / Interim history: 86 year old female with history of GI stromal tumor s/p resection in 2011, HTN,  Hypothyroidism, anemia and recent "cardiac arrest  ROSC after 1 minute of CPR" presenting with abdominal pain and admitted for acute gallstone pancreatitis and necrotic gastric mass as noted on CT abdomen and pelvis.  Lipase elevated to 2500.  AST 405.  ALT 140.  Total bili 1.3.   The next day, GI and general surgery consulted.  Pancreatitis and transaminitis resolved. EGD on 2/8 confirmed submucosal tumor in gastric fundus with minimal erosive changes but no active bleed.  Biopsy negative but concern that biopsy was not deep enough.  Larynx, esophagus and duodenum normal.  Oncology consulted and initiated [place].   Patient had EUS on 2/11 that revealed an intramural (subepithelial) lesion was found in the cardia of the stomach that appeared to originate from within the muscularis propria (Layer 4). Fine needle aspiration performed.  Subjective: Patient had EUS and FNA this morning.  No complaints this morning.  She denies pain or shortness of breath.  She says she would like to sleep a little bit.  Objective: Vitals:   01/15/20 1115 01/15/20 1125 01/15/20 1140 01/15/20 1210  BP: (!) 99/36 (!) 116/54 (!) 143/67 135/61  Pulse:  93 98 91  Resp: 18 18 20 16   Temp: 99.1 F (37.3 C)   99.7 F (37.6 C)  TempSrc: Oral   Oral  SpO2: 100% 100% 100% 97%  Weight:      Height:        Intake/Output Summary (Last 24 hours) at 01/15/2020 1223 Last data filed at 01/15/2020 1118 Gross per 24 hour  Intake 100 ml  Output 400 ml  Net -300 ml   Filed Weights   01/11/20 0500 01/14/20 0500 01/15/20 0500  Weight: 82.8 kg 83.6 kg 87.5 kg    Examination:  GENERAL: No apparent distress.  Somewhat sleepy. HEENT:  MMM.  Vision and hearing grossly intact.  NECK: Supple.  No apparent JVD.  RESP:  No IWOB.  Fair aeration bilaterally. CVS:  RRR. Heart sounds normal.  ABD/GI/GU: Bowel sounds present. Soft. Non tender.  MSK/EXT:  Moves extremities. No apparent deformity. No edema.  SKIN: no apparent skin lesion or wound NEURO: Somewhat sleepy but oriented appropriately.  No apparent focal neuro deficit. PSYCH: Calm. Normal affect.  Procedures:  2/8-EGD on 2/8 confirmed submucosal tumor in gastric fundus with minimal erosive changes but no active bleed.  Biopsy obtained.  Larynx, esophagus and duodenum normal.  2/11-EUS on 2/11 that revealed an intramural (subepithelial) lesion was found in the cardia of the stomach that appeared to originate from within the muscularis propria (Layer 4). Fine needle aspiration performed.   Assessment & Plan: Acute calculus pancreatitis: Noted on CT abdomen and pelvis.  She likely passed the stone.  Pancreatitis resolved. -Discontinued antibiotic  Recurrent GI stromal tumor: noted on CT and EGD.   -EGD biopsy negative but concern that biopsy was not deep enough.   -EUS biopsy pending -Oncology started [place] on 2/9 -GI and general surgery following.  Chronic diastolic CHF: Echo with EF of 55 to 60%, G2DD but no other structural functional abnormalities.  Not on diuretics.  Euvolemic. -Monitor fluid status  Elevated liver enzymes/hyperbilirubinemia: Likely due to the above.  Resolved.  Essential hypertension: SBP  slightly elevated. -Continue p.o. Coreg and amlodipine -Labetalol as needed  Hypothyroidism -Continue home Synthroid.  AKI: Initially thought to have CKD-3A.  Baseline Cr 0.9?>  1.82 (1/17)> 1.17 (admit)> 0.9> 1.22.  Due to [place]? -Continue monitoring  Iron deficiency anemia: Baseline Hgb 8-9> 9.5 (admit)> 9.1> 8.9>8.5> 8.8.  Iron saturation 7%.  Relatively stable.  -Received IV Feraheme on 2/7 and 2/11 -Continue monitoring  History of "cardiac  arrest".  -Telemetry monitoring  Leukocytosis: Likely due to #1.  Suspect demargination versus infectious process. -Continue monitoring  Debility: Uses walker and Rollator at baseline. -PT/OT-recommended SNF.  Patient and family prefers home with home health.  Cough and postnasal drainage -Flonase nasal spray and Claritin  Neuropathic pain: Previously on gabapentin.  -Gabapentin 300 mg a.m. and 600 mg at bedtime -Pyridoxine 50 mg daily  Hypokalemia: Resolved.  Goal of care: Full code.  Now concern for recurrent GI stromal tumor.  Per general surgery not resectable.  -Appreciate palliative care input. Pressure Injury 01/09/20 Sacrum Mid Stage 2 -  Partial thickness loss of dermis presenting as a shallow open injury with a red, pink wound bed without slough. (Active)  01/09/20 (Received in report that this pressure ulcer was present on admission)   Location: Sacrum  Location Orientation: Mid  Staging: Stage 2 -  Partial thickness loss of dermis presenting as a shallow open injury with a red, pink wound bed without slough.  Wound Description (Comments):   Present on Admission: Yes               DVT prophylaxis: Continue subcu Lovenox Code Status: Full code Family Communication: Patient and RN.  Updated patient's daughter over the phone 2/20.  Discharge barrier: Evaluation and treatment of GI stromal tumor Patient is from: Home Final disposition: Home with home health when cleared by GI, Gen surg, and oncology  Consultants: General surgery, GI, oncology and palliative care   Microbiology summarized: [id number] negative Influenza PCR negative  Sch Meds:  Scheduled Meds: . amLODipine  5 mg Oral Daily  . carvedilol  3.125 mg Oral BID WC  . enoxaparin (LOVENOX) injection  40 mg Subcutaneous Q24H  . fluticasone  2 spray Each Nare Daily  . gabapentin  300 mg Oral q morning - 10a  . gabapentin  600 mg Oral QHS  . guaiFENesin  600 mg Oral BID  . imatinib  400 mg Oral Q  breakfast  . levothyroxine  50 mcg Oral Q0600  . loratadine  10 mg Oral Daily  . pantoprazole (PROTONIX) IV  40 mg Intravenous Q24H  . vitamin B-6  50 mg Oral Daily   Continuous Infusions: . lactated ringers Stopped (01/15/20 1118)   PRN Meds:.acetaminophen **OR** acetaminophen, alum & mag hydroxide-simeth, ipratropium-albuterol, labetalol, ondansetron **OR** ondansetron (ZOFRAN) IV, oxyCODONE, oxymetazoline  Antimicrobials: Anti-infectives (From admission, onward)   Start     Dose/Rate Route Frequency Ordered Stop   01/10/20 1400  cefTRIAXone (ROCEPHIN) 2 g in sodium chloride 0.9 % 100 mL IVPB  Status:  Discontinued     2 g 200 mL/hr over 30 Minutes Intravenous Every 24 hours 01/10/20 1213 01/13/20 0706   01/09/20 1830  ciprofloxacin (CIPRO) IVPB 400 mg  Status:  Discontinued     400 mg 200 mL/hr over 60 Minutes Intravenous Every 12 hours 01/09/20 0853 01/10/20 1213   01/09/20 1400  metroNIDAZOLE (FLAGYL) IVPB 500 mg  Status:  Discontinued     500 mg 100 mL/hr over 60 Minutes Intravenous Every 8 hours 01/09/20 0611 01/13/20  4259   01/09/20 0630  ciprofloxacin (CIPRO) IVPB 400 mg     400 mg 200 mL/hr over 60 Minutes Intravenous  Once 01/09/20 0616 01/09/20 0909   01/09/20 0445  cefTRIAXone (ROCEPHIN) 1 g in sodium chloride 0.9 % 100 mL IVPB     1 g 200 mL/hr over 30 Minutes Intravenous  Once 01/09/20 0432 01/09/20 0516   01/09/20 0445  metroNIDAZOLE (FLAGYL) IVPB 500 mg     500 mg 100 mL/hr over 60 Minutes Intravenous  Once 01/09/20 0432 01/09/20 0613       I have personally reviewed the following labs and images: CBC: Recent Labs  Lab 01/11/20 0543 01/12/20 0316 01/13/20 0210 01/14/20 0221 01/15/20 0233  WBC 12.9* 13.4* 14.4* 14.6* 13.8*  HGB 8.7* 8.9* 8.5* 9.0* 8.8*  HCT 27.4* 28.6* 27.2* 28.7* 28.1*  MCV 92.3 93.2 92.8 92.0 92.7  PLT 165 194 211 251 278   BMP &GFR Recent Labs  Lab 01/10/20 0300 01/10/20 0300 01/11/20 0543 01/12/20 0316 01/13/20 0210  01/14/20 0221 01/15/20 0233  NA 141  --   --  138 139 139 138  K 4.0  --   --  3.5 3.4* 4.1 4.0  CL 110  --   --  107 107 108 107  CO2 21*  --   --  22 22 22 23   GLUCOSE 86  --   --  89 97 106* 104*  BUN 9  --   --  12 11 10 10   CREATININE 1.10*  --   --  1.02* 1.00 0.91 1.22*  CALCIUM 8.6*  --   --  8.8* 8.8* 9.1 9.1  MG 1.9   < > 2.0 1.9 1.8 1.8 1.8  PHOS 3.8   < > 2.8 3.0 2.9 2.7 2.9   < > = values in this interval not displayed.   Estimated Creatinine Clearance: 31.8 mL/min (A) (by C-G formula based on SCr of 1.22 mg/dL (H)). Liver & Pancreas: Recent Labs  Lab 01/09/20 0131 01/09/20 0131 01/10/20 0300 01/10/20 0300 01/11/20 0543 01/12/20 0316 01/13/20 0210 01/14/20 0221 01/15/20 0233  AST 405*  --  80*  --  34 23 18  --   --   ALT 140*  --  89*  --  55* 40 27  --   --   ALKPHOS 145*  --  109  --  91 95 83  --   --   BILITOT 1.3*  --  1.1  --  0.8 1.0 0.7  --   --   PROT 6.3*  --  5.1*  --  5.2* 5.3* 5.2*  --   --   ALBUMIN 3.2*   < > 2.4*   < > 2.2* 2.2* 2.1* 2.1* 2.1*   < > = values in this interval not displayed.   Recent Labs  Lab 01/09/20 0131 01/10/20 0300 01/11/20 0724 01/12/20 0316  LIPASE 2,582* 256* 53* 28   No results for input(s): AMMONIA in the last 168 hours. Diabetic: No results for input(s): HGBA1C in the last 72 hours. Recent Labs  Lab 01/14/20 0547 01/14/20 1204 01/14/20 1816 01/15/20 0023 01/15/20 1213  GLUCAP 85 129* 94 112* 98   Cardiac Enzymes: No results for input(s): CKTOTAL, CKMB, CKMBINDEX, TROPONINI in the last 168 hours. No results for input(s): PROBNP in the last 8760 hours. Coagulation Profile: Recent Labs  Lab 01/09/20 1501  INR 1.2   Thyroid Function Tests: No results for input(s): TSH, T4TOTAL, FREET4, T3FREE,  THYROIDAB in the last 72 hours. Lipid Profile: No results for input(s): CHOL, HDL, LDLCALC, TRIG, CHOLHDL, LDLDIRECT in the last 72 hours. Anemia Panel: No results for input(s): VITAMINB12, FOLATE,  FERRITIN, TIBC, IRON, RETICCTPCT in the last 72 hours. Urine analysis:    Component Value Date/Time   COLORURINE YELLOW 12/11/2019 1337   APPEARANCEUR CLEAR 12/11/2019 1337   LABSPEC 1.018 12/11/2019 1337   PHURINE 5.0 12/11/2019 1337   GLUCOSEU NEGATIVE 12/11/2019 1337   HGBUR NEGATIVE 12/11/2019 1337   BILIRUBINUR NEGATIVE 12/11/2019 1337   KETONESUR NEGATIVE 12/11/2019 1337   PROTEINUR 30 (A) 12/11/2019 1337   UROBILINOGEN 0.2 11/12/2010 2324   NITRITE NEGATIVE 12/11/2019 1337   LEUKOCYTESUR NEGATIVE 12/11/2019 1337   Sepsis Labs: Invalid input(s): PROCALCITONIN, [place]  Microbiology: Recent Results (from the past 240 hour(s))  Respiratory Panel by RT PCR (Flu A&B, Covid) - Nasopharyngeal Swab     Status: None   Collection Time: 01/09/20  4:16 AM   Specimen: Nasopharyngeal Swab  Result Value Ref Range Status   SARS Coronavirus 2 by RT PCR NEGATIVE NEGATIVE Final    Comment: (NOTE) SARS-CoV-2 target nucleic acids are NOT DETECTED. The SARS-CoV-2 RNA is generally detectable in upper respiratoy specimens during the acute phase of infection. The lowest concentration of SARS-CoV-2 viral copies this assay can detect is 131 copies/mL. A negative result does not preclude SARS-Cov-2 infection and should not be used as the sole basis for treatment or other patient management decisions. A negative result may occur with  improper specimen collection/handling, submission of specimen other than nasopharyngeal swab, presence of viral mutation(s) within the areas targeted by this assay, and inadequate number of viral copies (<131 copies/mL). A negative result must be combined with clinical observations, patient history, and epidemiological information. The expected result is Negative. Fact Sheet for Patients:  PinkCheek.be Fact Sheet for Healthcare Providers:  GravelBags.it This test is not yet ap proved or cleared by the  [place] FDA and  has been authorized for detection and/or diagnosis of SARS-CoV-2 by FDA under an Emergency Use Authorization (EUA). This EUA will remain  in effect (meaning this test can be used) for the duration of the COVID-19 declaration under Section 564(b)(1) of the Act, 21 U.S.C. section 360bbb-3(b)(1), unless the authorization is terminated or revoked sooner.    Influenza A by PCR NEGATIVE NEGATIVE Final   Influenza B by PCR NEGATIVE NEGATIVE Final    Comment: (NOTE) The Xpert Xpress SARS-CoV-2/FLU/RSV assay is intended as an aid in  the diagnosis of influenza from Nasopharyngeal swab specimens and  should not be used as a sole basis for treatment. Nasal washings and  aspirates are unacceptable for Xpert Xpress SARS-CoV-2/FLU/RSV  testing. Fact Sheet for Patients: PinkCheek.be Fact Sheet for Healthcare Providers: GravelBags.it This test is not yet approved or cleared by the [place] FDA and  has been authorized for detection and/or diagnosis of SARS-CoV-2 by  FDA under an Emergency Use Authorization (EUA). This EUA will remain  in effect (meaning this test can be used) for the duration of the  Covid-19 declaration under Section 564(b)(1) of the Act, 21  U.S.C. section 360bbb-3(b)(1), unless the authorization is  terminated or revoked. Performed at [facility], [place] [address] [place], [place] [address]     Radiology Studies: No results found.    Devlin Brink T. [place]  If 7PM-7AM, please contact night-coverage [email or web address] Password [facility] 01/15/2020, 12:23 PM

## 2020-01-15 NOTE — Transfer of Care (Signed)
Immediate Anesthesia Transfer of Care Note  Patient: Monique Lin  Procedure(s) Performed: ESOPHAGOGASTRODUODENOSCOPY (EGD) WITH PROPOFOL (N/A ) UPPER ENDOSCOPIC ULTRASOUND (EUS) RADIAL (N/A ) FINE NEEDLE ASPIRATION (FNA) LINEAR  Patient Location: PACU and Endoscopy Unit  Anesthesia Type:MAC  Level of Consciousness: drowsy and responds to stimulation  Airway & Oxygen Therapy: Patient Spontanous Breathing and Patient connected to nasal cannula oxygen  Post-op Assessment: Report given to RN and Post -op Vital signs reviewed and stable  Post vital signs: Reviewed and stable  Last Vitals:  Vitals Value Taken Time  BP    Temp    Pulse    Resp    SpO2      Last Pain:  Vitals:   01/15/20 1004  TempSrc: Oral  PainSc: 0-No pain      Patients Stated Pain Goal: 0 (03/79/44 4619)  Complications: No apparent anesthesia complications

## 2020-01-15 NOTE — Interval H&P Note (Signed)
History and Physical Interval Note:  01/15/2020 10:36 AM  Monique Lin  has presented today for surgery, with the diagnosis of gastric mass.  The various methods of treatment have been discussed with the patient and family. After consideration of risks, benefits and other options for treatment, the patient has consented to  Procedure(s): ESOPHAGOGASTRODUODENOSCOPY (EGD) WITH PROPOFOL (N/A) UPPER ENDOSCOPIC ULTRASOUND (EUS) RADIAL (N/A) as a surgical intervention.  The patient's history has been reviewed, patient examined, no change in status, stable for surgery.  I have reviewed the patient's chart and labs.  Questions were answered to the patient's satisfaction.     Monique Lin M  Assessment:   Gastric mass.  History of GIST.  Endoscopy with mucosal biopsies unrevealing.  Plan:   Upper endoscopic ultrasound with possible fine needle aspiration. Risks (bleeding, infection, bowel perforation that could require surgery, sedation-related changes in cardiopulmonary systems), benefits (identification and possible treatment of source of symptoms, exclusion of certain causes of symptoms), and alternatives (watchful waiting, radiographic imaging studies, empiric medical treatment) of upper endoscopy with ultrasound and possible fine needle aspiration (EUS +/_ FNA) were explained to patient/family in detail and patient wishes to proceed.

## 2020-01-15 NOTE — Progress Notes (Signed)
PT Cancellation Note  Patient Details Name: Monique Lin MRN: [id number] DOB: 28-Jul-1927   Cancelled Treatment:    Reason Eval/Treat Not Completed: Other (comment).  Pt declined PT today due to having her procedure and still feeling weak.  Follow up at another time.   Ramond Dial 01/15/2020, 3:41 PM   Mee Hives, PT [facility] Acute Rehab Dept. Number: [place] and [place]

## 2020-01-15 NOTE — Progress Notes (Signed)
Pt resting. Daughter called to let know she's resting and no distress noted. No answer/no voicemail.

## 2020-01-15 NOTE — Progress Notes (Signed)
PT Cancellation Note  Patient Details Name: Monique Lin MRN: [id number] DOB: Feb 02, 1927   Cancelled Treatment:    Reason Eval/Treat Not Completed: Patient at procedure or test/unavailable.  Was in endoscopy this AM and will try again as time and pt allow.   Ramond Dial 01/15/2020, 1:29 PM   Mee Hives, PT [facility] Acute Rehab Dept. Number: [place] and [place]

## 2020-01-16 ENCOUNTER — Other Ambulatory Visit: Payer: Self-pay | Admitting: Hematology & Oncology

## 2020-01-16 ENCOUNTER — Other Ambulatory Visit: Payer: Self-pay

## 2020-01-16 DIAGNOSIS — L89152 Pressure ulcer of sacral region, stage 2: Secondary | ICD-10-CM

## 2020-01-16 LAB — RENAL FUNCTION PANEL
Albumin: 2 g/dL — ABNORMAL LOW (ref 3.5–5.0)
Anion gap: 8 (ref 5–15)
BUN: 12 mg/dL (ref 8–23)
CO2: 22 mmol/L (ref 22–32)
Calcium: 8.8 mg/dL — ABNORMAL LOW (ref 8.9–10.3)
Chloride: 109 mmol/L (ref 98–111)
Creatinine, Ser: 1.32 mg/dL — ABNORMAL HIGH (ref 0.44–1.00)
GFR calc Af Amer: 41 mL/min — ABNORMAL LOW (ref >60)
GFR calc non Af Amer: 35 mL/min — ABNORMAL LOW (ref >60)
Glucose, Bld: 111 mg/dL — ABNORMAL HIGH (ref 70–99)
Phosphorus: 3.6 mg/dL (ref 2.5–4.6)
Potassium: 4.1 mmol/L (ref 3.5–5.1)
Sodium: 139 mmol/L (ref 135–145)

## 2020-01-16 LAB — CBC
HCT: 28.6 % — ABNORMAL LOW (ref 36.0–46.0)
Hemoglobin: 8.9 g/dL — ABNORMAL LOW (ref 12.0–15.0)
MCH: 28.9 pg (ref 26.0–34.0)
MCHC: 31.1 g/dL (ref 30.0–36.0)
MCV: 92.9 fL (ref 80.0–100.0)
Platelets: 297 10*3/uL (ref 150–400)
RBC: 3.08 MIL/uL — ABNORMAL LOW (ref 3.87–5.11)
RDW: 18.4 % — ABNORMAL HIGH (ref 11.5–15.5)
WBC: 12.1 10*3/uL — ABNORMAL HIGH (ref 4.0–10.5)
nRBC: 0 % (ref 0.0–0.2)

## 2020-01-16 LAB — GLUCOSE, CAPILLARY
Glucose-Capillary: 80 mg/dL (ref 70–99)
Glucose-Capillary: 127 mg/dL — ABNORMAL HIGH (ref 70–99)

## 2020-01-16 LAB — MAGNESIUM: Magnesium: 1.6 mg/dL — ABNORMAL LOW (ref 1.7–2.4)

## 2020-01-16 MED ORDER — GABAPENTIN 300 MG PO CAPS: 300.0000 mg | ORAL_CAPSULE | Freq: Two times a day (BID) | ORAL | 0 refills | Status: DC

## 2020-01-16 MED ORDER — IMATINIB MESYLATE 400 MG PO TABS: 400.0000 mg | ORAL_TABLET | Freq: Every day | ORAL | 5 refills | Status: DC

## 2020-01-16 MED ORDER — ENSURE ENLIVE PO LIQD: 237.0000 mL | Freq: Two times a day (BID) | ORAL | 2 refills | Status: AC

## 2020-01-16 MED ORDER — IMATINIB MESYLATE 400 MG PO TABS: 400.0000 mg | ORAL_TABLET | Freq: Every day | ORAL | 0 refills | Status: DC

## 2020-01-16 MED ORDER — MAGNESIUM SULFATE 2 GM/50ML IV SOLN
2.0000 g | Freq: Once | INTRAVENOUS | Status: AC
  Administered 2020-01-16: 2 g via INTRAVENOUS
  Filled 2020-01-16: qty 50

## 2020-01-16 MED ORDER — GABAPENTIN 300 MG PO CAPS: 300.0000 mg | ORAL_CAPSULE | Freq: Two times a day (BID) | ORAL | 1 refills | Status: AC

## 2020-01-16 MED FILL — GABAPENTIN 300 MG CAPSULE: 300 | 30 days supply | Qty: 60 | Fill #0

## 2020-01-16 NOTE — Progress Notes (Addendum)
Physical Therapy Treatment Patient Details Name: Monique Lin MRN: [id number] DOB: [birth year] Today's Date: 01/16/2020    History of Present Illness 86 y.o. female with history of GI stromal tumor status post resection in 2011 with history of hypertension hypothyroidism who was recently admitted for possible cardiac arrest requiring CPR for 1 minute at this time patient was found to be in anemia was transfused and discharged home started experiencing severe epigastric pain since yesterday. CT scan showing necrotic gastric tumor and also CBD stone with pancreatitis. 2/6 GI noted pt improved and likely passed the stone. S/p EGD 2/11.   PT Comments    Pt with limited mobility progression; limited by fatigue and generalized weakness, requiring max encouragement to participate. Required mod-maxA+2 to perform stand pivot transfers this session; requires prolonged seated rest break to recover between bouts of mobility. Based on current functional status and increased assist need, recommend SNF-level therapies if pt and family agreeable. Will continue to follow acutely and assist with d/c planning.    Follow Up Recommendations  SNF;Supervision/Assistance - 24 hour(if agreeable)     Equipment Recommendations  None recommended by PT    Recommendations for Other Services       Precautions / Restrictions Precautions Precautions: Fall Restrictions Weight Bearing Restrictions: No    Mobility  Bed Mobility Overal bed mobility: Needs Assistance Bed Mobility: Supine to Sit     Supine to sit: Mod assist;HOB elevated     General bed mobility comments: Max encouragement to perform as independently as possible, pt hesitant to move c/o neuropathy affecting her. Cues for sequencing; ModA for trunk elevation and scooting hips to EOB  Transfers Overall transfer level: Needs assistance Equipment used: 2 person hand held assist Transfers: Sit to/from [facility] Sit to Stand: Mod  assist;+2 physical assistance Stand pivot transfers: Mod assist;+2 physical assistance       General transfer comment: Unable to achieve fully upright standing with assist+1; modA+2 to stand pivot from bed>BSC>recliner with bilat HHA, pt limited by fatigue  Ambulation/Gait                 Stairs             Wheelchair Mobility    Modified Rankin (Stroke Patients Only)       Balance Overall balance assessment: Needs assistance Sitting-balance support: No upper extremity supported Sitting balance-Leahy Scale: Fair     Standing balance support: Bilateral upper extremity supported;During functional activity Standing balance-Leahy Scale: Poor Standing balance comment: reliant on UE and external support                            Cognition Arousal/Alertness: Awake/alert Behavior During Therapy: Flat affect Overall Cognitive Status: No family/caregiver present to determine baseline cognitive functioning                                 General Comments: Anxious regarding mobility and pain and neuropathy. Cues for participation and sequencing. Fatigued this session      Exercises      General Comments General comments (skin integrity, edema, etc.): Fatigues quickly, needs lots of encouragement      Pertinent Vitals/Pain Pain Assessment: Faces Faces Pain Scale: Hurts little more Pain Location: Generalized, especially stomach Pain Descriptors / Indicators: Discomfort;Aching Pain Intervention(s): Monitored during session;Limited activity within patient's tolerance    Home Living  Prior Function            PT Goals (current goals can now be found in the care plan section) Progress towards PT goals: Not progressing toward goals - comment(limited by fatigue, some self-limiting)    Frequency    Min 3X/week      PT Plan Current plan remains appropriate    Co-evaluation PT/OT/SLP  Co-Evaluation/Treatment: Yes Reason for Co-Treatment: Necessary to address cognition/behavior during functional activity;For patient/therapist safety;To address functional/ADL transfers(poor activity tolerance) PT goals addressed during session: Mobility/safety with mobility;Balance;Proper use of DME        AM-PAC PT "6 Clicks" Mobility   Outcome Measure  Help needed turning from your back to your side while in a flat bed without using bedrails?: A Lot Help needed moving from lying on your back to sitting on the side of a flat bed without using bedrails?: A Lot Help needed moving to and from a bed to a chair (including a wheelchair)?: A Lot Help needed standing up from a chair using your arms (e.g., wheelchair or bedside chair)?: A Lot Help needed to walk in hospital room?: A Lot Help needed climbing 3-5 steps with a railing? : Total 6 Click Score: 11    End of Session Equipment Utilized During Treatment: Gait belt Activity Tolerance: Patient limited by fatigue Patient left: in chair;with call bell/phone within reach;with chair alarm set Nurse Communication: Mobility status PT Visit Diagnosis: Muscle weakness (generalized) (M62.81);Unsteadiness on feet (R26.81)     Time: 6384-6659 PT Time Calculation (min) (ACUTE ONLY): 43 min  Charges:  $Therapeutic Activity: 23-37 mins                     Mabeline Caras, PT, DPT Acute Rehabilitation Services  Pager [phone] Office [phone]  Derry Lory 01/16/2020, 12:11 PM

## 2020-01-16 NOTE — Discharge Summary (Signed)
Physician Discharge Summary  [name] HKV:[id number] DOB: 1927/06/22 DOA: 01/09/2020  PCP: [name], MD  Admit date: 01/09/2020 Discharge date: 01/16/2020  Admitted From: Home. Disposition: Home  Recommendations for Outpatient Follow-up:  1. Follow ups as below. 2. Oncology to arrange outpatient follow-up. 3. Please obtain CBC/BMP/Mag at follow up 4. Please follow up on the following pending results: Gastric mass biopsy  Home Health: PT/OT Equipment/Devices: None  Discharge Condition: Stable CODE STATUS: Full code  Follow-up Information    [name], MD. Schedule an appointment as soon as possible for a visit in 1 week(s).   Specialty: Cardiology Contact information: [place] [address] [phone]           Hospital Course: 86 year old female with history of GI stromal tumor s/p resection in 2011, HTN,  Hypothyroidism, anemia and recent "cardiac arrest  ROSC after 1 minute of CPR" presenting with abdominal pain and admitted for acute gallstone pancreatitis and necrotic gastric mass as noted on CT abdomen and pelvis.  Lipase elevated to 2500.  AST 405.  ALT 140.  Total bili 1.3.   The next day, GI and general surgery consulted.  Pancreatitis and transaminitis resolved, likely passed the stone.  In regards to gastric mass, EGD on 2/8 confirmed submucosal tumor in gastric fundus with minimal erosive changes but no active bleed.  Biopsy negative but concern that biopsy was not deep enough. Larynx, esophagus and duodenum normal. Oncology consulted and initiated [employer].   Patient had EUS on 2/11 that revealed an intramural (subepithelial) lesion was found in the cardia of the stomach that appeared to originate from within the muscularis propria (Layer 4). Fine needle aspiration performed.  Pathology pending at time of discharge.  Patient will be discharged on [place] per oncology.  Oncology to arrange outpatient follow-up.  See  individual problem list below for more hospital course.   Discharge Diagnoses:  Acute calculus pancreatitis: Noted on CT abdomen and pelvis.  She likely passed the stone.  Pancreatitis resolved. -Discontinued antibiotic  Recurrent GI stromal tumor: noted on CT and EGD.   -EGD biopsy negative but concern that biopsy was not deep enough.   -EUS FNA pathology result pending -Oncology started [place] on 2/9.  Oncology to send Rx to [facility], and arrange outpatient follow-up  Chronic diastolic CHF: Echo with EF of 55 to 60%, G2DD but no other structural functional abnormalities.  Not on diuretics.  Euvolemic.  Elevated liver enzymes/hyperbilirubinemia: Likely due to the above.  Resolved.  Essential hypertension: Slightly low diastolic BPs.  Not symptomatic. -Continue home Coreg -Discontinued amlodipine.  Hypothyroidism -Continue home Synthroid.  AKI on CKD-3A. Cr 1.82 (1/17)> 1.17 (admit)> 0.9> 1.22> 1.32.  Due to [place]?.  -Recheck BMP in 1 week  Iron deficiency anemia: Baseline Hgb 8-9> 9.5 (admit)> 9.1> 8.9>8.5> 8.9.  Iron saturation 7%.  Relatively stable.  -Received IV Feraheme on 2/7 and 2/11 -Continue monitoring  History of "cardiac arrest".  -Telemetry monitoring  Leukocytosis: Likely due to #1.  Suspect demargination versus infectious process.  Improved. -Recheck CBC in 1 week  Debility: Uses walker and Rollator at baseline. -PT/OT-recommended SNF.  Patient and family prefers home with home health.  Cough and postnasal drainage -Flonase nasal spray and Claritin  Neuropathic pain: Previously on gabapentin.  -Gabapentin 300 mg twice daily  Hypokalemia: Resolved.  Nutrition: Poor p.o. intake -Ensure Enlive  Goal of care: Full code.  Now concern for recurrent GI stromal tumor.  Per general surgery not  resectable.  -Appreciate palliative care input. Pressure Injury 01/09/20 Sacrum Mid Stage 2 -  Partial thickness loss of dermis presenting as a shallow  open injury with a red, pink wound bed without slough. (Active)  01/09/20 (Received in report that this pressure ulcer was present on admission)   Location: Sacrum  Location Orientation: Mid  Staging: Stage 2 -  Partial thickness loss of dermis presenting as a shallow open injury with a red, pink wound bed without slough.  Wound Description (Comments):   Present on Admission: Yes   Family communication: Updated patient's daughter on the day of discharge.  Discharge Instructions  Discharge Instructions    Call MD for:  difficulty breathing, headache or visual disturbances   Complete by: As directed    Call MD for:  extreme fatigue   Complete by: As directed    Call MD for:  persistant dizziness or light-headedness   Complete by: As directed    Call MD for:  persistant nausea and vomiting   Complete by: As directed    Call MD for:  temperature >100.4   Complete by: As directed    Diet - low sodium heart healthy   Complete by: As directed    Discharge instructions   Complete by: As directed    It has been a pleasure taking care of you! You were hospitalized with abdominal pain due to gallstone pancreatitis. It seems you have already passed the stone without surgical intervention. There was also a concern about a return of your gastric tumor.  You had a biopsy done. The result is still pending.  The oncology staff started you on chemotherapy.  The oncology team will get in touch with you to discuss about further plan when they have the result of your biopsy.  Please review your new medication list and the directions before you take your medications.  Please follow-up with your primary care doctor 1 to 2 weeks for hospital follow-up.   Take care,   Increase activity slowly   Complete by: As directed      Allergies as of 01/16/2020      Reactions   Codeine Other (See Comments)   Stomach cramps   Penicillins Other (See Comments)   "Bad stomach cramps" Has patient had a PCN  reaction causing immediate rash, facial/tongue/throat swelling, SOB or lightheadedness with hypotension: Unk Has patient had a PCN reaction causing severe rash involving mucus membranes or skin necrosis: Unk Has patient had a PCN reaction that required hospitalization: Unk Has patient had a PCN reaction occurring within the last 10 years: No If all of the above answers are "NO", then may proceed with Cephalosporin use.      Medication List    STOP taking these medications   amLODipine 5 MG tablet Commonly known as: NORVASC   CORICIDIN HBP CONGESTION/COUGH PO   diphenhydrAMINE 25 MG tablet Commonly known as: BENADRYL   doxycycline 100 MG tablet Commonly known as: VIBRA-TABS     TAKE these medications   acetaminophen 500 MG tablet Commonly known as: TYLENOL Take 1 tablet (500 mg total) by mouth every 4 (four) hours as needed (pain).   albuterol 108 (90 Base) MCG/ACT inhaler Commonly known as: VENTOLIN HFA Inhale 1 puff into the lungs every 6 (six) hours as needed for wheezing or shortness of breath.   albuterol (2.5 MG/3ML) 0.083% nebulizer solution Commonly known as: PROVENTIL Take 2.5 mg by nebulization every 6 (six) hours as needed for wheezing.   alum &  mag hydroxide-simeth 200-200-20 MG/5ML suspension Commonly known as: MAALOX/MYLANTA Take 30 mLs by mouth every 6 (six) hours as needed for indigestion, heartburn or flatulence.   atorvastatin 20 MG tablet Commonly known as: LIPITOR Take 20 mg by mouth daily.   bisacodyl 5 MG EC tablet Commonly known as: DULCOLAX Take 1 tablet (5 mg total) by mouth daily as needed for moderate constipation.   carvedilol 6.25 MG tablet Commonly known as: COREG Take 1 tablet (6.25 mg total) by mouth 2 (two) times daily with a meal.   feeding supplement (ENSURE ENLIVE) Liqd Take 237 mLs by mouth 2 (two) times daily between meals.   ferrous sulfate 325 (65 FE) MG tablet Take 1 tablet (325 mg total) by mouth 2 (two) times daily with  a meal.   fluticasone 50 MCG/ACT nasal spray Commonly known as: FLONASE Place 2 sprays into both nostrils daily as needed for allergies or rhinitis.   folic acid 546 MCG tablet Commonly known as: FOLVITE Take 800 mcg by mouth daily.   gabapentin 300 MG capsule Commonly known as: NEURONTIN Take 1 capsule (300 mg total) by mouth 2 (two) times daily.   imatinib 400 MG tablet Commonly known as: GLEEVEC Take 1 tablet (400 mg total) by mouth daily with breakfast. Take with meals and large glass of water.Caution:Chemotherapy Start taking on: January 17, 2020   levothyroxine 50 MCG tablet Commonly known as: SYNTHROID Take 50 mcg by mouth daily.   pantoprazole 40 MG tablet Commonly known as: PROTONIX Take 1 tablet (40 mg total) by mouth 2 (two) times daily.   trolamine salicylate 10 % cream Commonly known as: ASPERCREME Apply 1 application topically as needed for muscle pain.   VITAMIN B-12 PO Take 500 mcg by mouth daily.   Vitamin D-3 125 MCG (5000 UT) Tabs Take 5,000 Units by mouth daily.   vitamin E 180 MG (400 UNITS) capsule Take 400 Units by mouth daily.       Consultations:  Oncology  Gastroenterology  General surgery  Procedures/Studies:  2D Echo on 01/13/2020 1. Left ventricular ejection fraction, by estimation, is 55 to 60%. The  left ventricle has normal function. The left ventrical has no regional  wall motion abnormalities. There is mildly increased left ventricular  hypertrophy. Left ventricular diastolic  parameters are consistent with Grade II diastolic dysfunction  (pseudonormalization).  2. Right ventricular systolic function is normal. The right ventricular  size is normal.  3. Trivial mitral valve regurgitation.  4. The aortic valve is tricuspid. Aortic valve regurgitation is not  visualized. Mild aortic valve sclerosis is present, with no evidence of  aortic valve stenosis.  5. The inferior vena cava is normal in size with greater than  50%  respiratory variability, suggesting right atrial pressure of 3 mmHg.  2/8-EGD on 2/8 confirmed submucosal tumor in gastric fundus with minimal erosive changes but no active bleed.  Biopsy obtained.  Larynx, esophagus and duodenum normal.   2/11-EUS on 2/11 that revealed an intramural (subepithelial) lesion was found in the cardia of the stomach that appeared to originate from within the muscularis propria (Layer 4). Fine needle aspiration performed.  CXR PA and lateral  Result Date: 12/18/2019 CLINICAL DATA:  Follow-up pneumonia EXAM: CHEST - 2 VIEW COMPARISON:  12/16/2019, 12/11/2019 FINDINGS: Small bilateral pleural effusions which do not appear significantly changed. Persistent left greater than right basilar consolidations. Enlarged cardiomediastinal silhouette with aortic atherosclerosis. No pneumothorax. IMPRESSION: Similar small left greater than right pleural effusions with basilar atelectasis or pneumonia.  Mild cardiomegaly. Electronically Signed   By: Donavan Foil M.D.   On: 12/18/2019 19:06   CT ABDOMEN PELVIS W CONTRAST  Result Date: 01/09/2020 CLINICAL DATA:  Abdominal pain EXAM: CT ABDOMEN AND PELVIS WITH CONTRAST TECHNIQUE: Multidetector CT imaging of the abdomen and pelvis was performed using the standard protocol following bolus administration of intravenous contrast. CONTRAST:  150mL OMNIPAQUE IOHEXOL 300 MG/ML  SOLN COMPARISON:  09/20/2010 FINDINGS: Lower chest: Chronic consolidation in the lower lobes is noted left greater than right with changes of bronchiectasis and mucous plugging. Hepatobiliary: Gallbladder is well distended. Multiple gallstones are seen. Biliary ductal dilatation is noted secondary to a distal common bile duct stone best seen on image number 31 of series 3 and image number 43 of series 6. Pancreas: Pancreas demonstrates some peripancreatic inflammatory change near the head and uncinate process likely related to the distal common bile duct stone and focal  pancreatitis. The body and tail are not well visualized due to a large enhancing mass lesion with central necrosis as described below. Spleen: Spleen appears within normal limits. Adrenals/Urinary Tract: Adrenal glands appear within normal limits. The kidneys demonstrate a normal enhancement pattern. Renal cystic change is noted. Normal excretion of contrast material is seen on delayed images. The bladder is decompressed. Stomach/Bowel: Scattered diverticular change of the colon is noted without evidence of diverticulitis. The colon is within normal limits without obstructive change. The appendix is not well seen and may have been surgically removed. No small bowel obstructive changes are seen. Stomach is decompressed. Along the posterior aspect of the stomach there is a large 11.0 x 10.8 cm peripherally enhancing lesion with central necrosis. This envelops the splenic vein and appears to arise from the posterior aspect of the stomach. Distortion of previously seen surgical clips are noted. The body and tail of the pancreas are enveloped by this lesion is well. Vascular/Lymphatic: Aortic atherosclerosis. No enlarged abdominal or pelvic lymph nodes. Reproductive: Uterus and bilateral adnexa are unremarkable. Other: Small fat containing umbilical hernia is noted. No ascites is seen. Musculoskeletal: Degenerative changes of lumbar spine are noted. IMPRESSION: Large centrally necrotic mass lesion which appears to arise from the posterior aspect of the stomach within envelopment of the splenic vein as well as the body and tail of the pancreas. Patient has a history of prior gastrointestinal stromal tumor removal in 2011 and this likely represents local recurrence in the posterior stomach wall. Cholelithiasis with evidence of a distal common bile duct stone causing mild biliary ductal dilatation and focal pancreatitis involving the head and uncinate process. Chronic consolidation in the lower lobes bilaterally with mucous  plugging and bronchiectasis. Electronically Signed   By: Inez Catalina M.D.   On: 01/09/2020 03:35   ECHOCARDIOGRAM COMPLETE  Result Date: 01/13/2020    ECHOCARDIOGRAM REPORT   Patient Name:   Monique Lin Date of Exam: 01/13/2020 Medical Rec #:  [id number]    Height:       62.5 in Accession #:    [id number]   Weight:       182.5 lb Date of Birth:  [birth year]     BSA:          1.85 m Patient Age:    18 years     BP:           155/70 mmHg Patient Gender: F            HR:           93 bpm. Exam Location:  Inpatient Procedure: 2D Echo Indications:     Chemotherapy evaluation v87.41 / v58.11  History:         Patient has prior history of Echocardiogram examinations, most                  recent 04/12/2018. Risk Factors:Hypertension. Thyroid disease.                  Anemia. Recent cardiac arrest. Acute kidney injury.  Sonographer:     Darlina Sicilian RDCS Referring Phys:  [place] Phys: [name] MD IMPRESSIONS  1. Left ventricular ejection fraction, by estimation, is 55 to 60%. The left ventricle has normal function. The left ventrical has no regional wall motion abnormalities. There is mildly increased left ventricular hypertrophy. Left ventricular diastolic parameters are consistent with Grade II diastolic dysfunction (pseudonormalization).  2. Right ventricular systolic function is normal. The right ventricular size is normal.  3. Trivial mitral valve regurgitation.  4. The aortic valve is tricuspid. Aortic valve regurgitation is not visualized. Mild aortic valve sclerosis is present, with no evidence of aortic valve stenosis.  5. The inferior vena cava is normal in size with greater than 50% respiratory variability, suggesting right atrial pressure of 3 mmHg. FINDINGS  Left Ventricle: Left ventricular ejection fraction, by estimation, is 55 to 60%. The left ventricle has normal function. The left ventricle has no regional wall motion abnormalities. The left ventricular internal cavity size  was normal in size. There is  mildly increased left ventricular hypertrophy. Concentric left ventricular hypertrophy. Left ventricular diastolic parameters are consistent with Grade II diastolic dysfunction (pseudonormalization). Right Ventricle: The right ventricular size is normal. No increase in right ventricular wall thickness. Right ventricular systolic function is normal. Left Atrium: Left atrial size was normal in size. Right Atrium: Right atrial size was normal in size. Pericardium: Trivial pericardial effusion is present. The pericardial effusion is circumferential. There is no evidence of cardiac tamponade. Mitral Valve: The mitral valve is normal in structure and function. Trivial mitral valve regurgitation. Tricuspid Valve: The tricuspid valve is normal in structure. Tricuspid valve regurgitation is not demonstrated. Aortic Valve: The aortic valve is tricuspid. Aortic valve regurgitation is not visualized. Mild aortic valve sclerosis is present, with no evidence of aortic valve stenosis. There is moderate calcification of the aortic valve. Pulmonic Valve: The pulmonic valve was normal in structure. Pulmonic valve regurgitation is trivial. Aorta: The aortic root and ascending aorta are structurally normal, with no evidence of dilitation. Venous: The inferior vena cava was not well visualized. The inferior vena cava is normal in size with greater than 50% respiratory variability, suggesting right atrial pressure of 3 mmHg. IAS/Shunts: The interatrial septum was not assessed.  LEFT VENTRICLE PLAX 2D LVIDd:         3.20 cm  Diastology LVIDs:         2.30 cm  LV e' lateral:   7.29 cm/s LV PW:         1.00 cm  LV E/e' lateral: 12.0 LV IVS:        1.10 cm  LV e' medial:    5.77 cm/s LVOT diam:     1.80 cm  LV E/e' medial:  15.2 LV SV:         56.24 ml LV SV Index:   11.74 LVOT Area:     2.54 cm  RIGHT VENTRICLE RV S prime:     19.90 cm/s TAPSE (M-mode): 2.4 cm LEFT ATRIUM  Index LA diam:    2.90 cm 1.57  cm/m  AORTIC VALVE LVOT Vmax:   109.00 cm/s LVOT Vmean:  75.600 cm/s LVOT VTI:    0.221 m  AORTA Ao Root diam: 3.00 cm Ao Asc diam:  3.30 cm MITRAL VALVE MV Area (PHT): 4.60 cm             SHUNTS MV Decel Time: 165 msec             Systemic VTI:  0.22 m MV E velocity: 87.70 cm/s 103 cm/s  Systemic Diam: 1.80 cm MV A velocity: 78.80 cm/s 70.3 cm/s MV E/A ratio:  1.11       1.5 [name] MD Electronically signed by [name] MD Signature Date/Time: 01/13/2020/1:01:49 PM    Final       Discharge Exam: Vitals:   01/16/20 0734 01/16/20 1628  BP: (!) 116/54 (!) 119/52  Pulse: 80 84  Resp: 16 16  Temp: 99.6 F (37.6 C) 98.9 F (37.2 C)  SpO2: 96%     GENERAL: No acute distress.  Appears well.  HEENT: MMM.  Vision and hearing grossly intact.  NECK: Supple.  No apparent JVD.  RESP:  No IWOB. Good air movement bilaterally. CVS:  RRR. Heart sounds normal.  ABD/GI/GU: Bowel sounds present. Soft. Non tender.  MSK/EXT:  Moves extremities. No apparent deformity or edema.  SKIN: no apparent skin lesion or wound NEURO: Awake, alert and oriented appropriately.  No apparent focal neuro deficit. PSYCH: Calm. Normal affect.    The results of significant diagnostics from this hospitalization (including imaging, microbiology, ancillary and laboratory) are listed below for reference.     Microbiology: Recent Results (from the past 240 hour(s))  Respiratory Panel by RT PCR (Flu A&B, Covid) - Nasopharyngeal Swab     Status: None   Collection Time: 01/09/20  4:16 AM   Specimen: Nasopharyngeal Swab  Result Value Ref Range Status   SARS Coronavirus 2 by RT PCR NEGATIVE NEGATIVE Final    Comment: (NOTE) SARS-CoV-2 target nucleic acids are NOT DETECTED. The SARS-CoV-2 RNA is generally detectable in upper respiratoy specimens during the acute phase of infection. The lowest concentration of SARS-CoV-2 viral copies this assay can detect is 131 copies/mL. A negative result does not preclude  SARS-Cov-2 infection and should not be used as the sole basis for treatment or other patient management decisions. A negative result may occur with  improper specimen collection/handling, submission of specimen other than nasopharyngeal swab, presence of viral mutation(s) within the areas targeted by this assay, and inadequate number of viral copies (<131 copies/mL). A negative result must be combined with clinical observations, patient history, and epidemiological information. The expected result is Negative. Fact Sheet for Patients:  PinkCheek.be Fact Sheet for Healthcare Providers:  GravelBags.it This test is not yet ap proved or cleared by the [place] FDA and  has been authorized for detection and/or diagnosis of SARS-CoV-2 by FDA under an Emergency Use Authorization (EUA). This EUA will remain  in effect (meaning this test can be used) for the duration of the COVID-19 declaration under Section 564(b)(1) of the Act, 21 U.S.C. section 360bbb-3(b)(1), unless the authorization is terminated or revoked sooner.    Influenza A by PCR NEGATIVE NEGATIVE Final   Influenza B by PCR NEGATIVE NEGATIVE Final    Comment: (NOTE) The Xpert Xpress SARS-CoV-2/FLU/RSV assay is intended as an aid in  the diagnosis of influenza from Nasopharyngeal swab specimens and  should not be used as  a sole basis for treatment. Nasal washings and  aspirates are unacceptable for Xpert Xpress SARS-CoV-2/FLU/RSV  testing. Fact Sheet for Patients: PinkCheek.be Fact Sheet for Healthcare Providers: GravelBags.it This test is not yet approved or cleared by the [place] FDA and  has been authorized for detection and/or diagnosis of SARS-CoV-2 by  FDA under an Emergency Use Authorization (EUA). This EUA will remain  in effect (meaning this test can be used) for the duration of the  Covid-19  declaration under Section 564(b)(1) of the Act, 21  U.S.C. section 360bbb-3(b)(1), unless the authorization is  terminated or revoked. Performed at [facility], [place] [address] [place], [place] [address]      Labs: BNP (last 3 results) No results for input(s): BNP in the last 8760 hours. Basic Metabolic Panel: Recent Labs  Lab 01/12/20 0316 01/13/20 0210 01/14/20 0221 01/15/20 0233 01/16/20 0114  NA 138 139 139 138 139  K 3.5 3.4* 4.1 4.0 4.1  CL 107 107 108 107 109  CO2 22 22 22 23 22   GLUCOSE 89 97 106* 104* 111*  BUN 12 11 10 10 12   CREATININE 1.02* 1.00 0.91 1.22* 1.32*  CALCIUM 8.8* 8.8* 9.1 9.1 8.8*  MG 1.9 1.8 1.8 1.8 1.6*  PHOS 3.0 2.9 2.7 2.9 3.6   Liver Function Tests: Recent Labs  Lab 01/10/20 0300 01/10/20 0300 01/11/20 0543 01/11/20 0543 01/12/20 0316 01/13/20 0210 01/14/20 0221 01/15/20 0233 01/16/20 0114  AST 80*  --  34  --  23 18  --   --   --   ALT 89*  --  55*  --  40 27  --   --   --   ALKPHOS 109  --  91  --  95 83  --   --   --   BILITOT 1.1  --  0.8  --  1.0 0.7  --   --   --   PROT 5.1*  --  5.2*  --  5.3* 5.2*  --   --   --   ALBUMIN 2.4*   < > 2.2*   < > 2.2* 2.1* 2.1* 2.1* 2.0*   < > = values in this interval not displayed.   Recent Labs  Lab 01/10/20 0300 01/11/20 0724 01/12/20 0316  LIPASE 256* 53* 28   No results for input(s): AMMONIA in the last 168 hours. CBC: Recent Labs  Lab 01/12/20 0316 01/13/20 0210 01/14/20 0221 01/15/20 0233 01/16/20 0114  WBC 13.4* 14.4* 14.6* 13.8* 12.1*  HGB 8.9* 8.5* 9.0* 8.8* 8.9*  HCT 28.6* 27.2* 28.7* 28.1* 28.6*  MCV 93.2 92.8 92.0 92.7 92.9  PLT 194 211 251 278 297   Cardiac Enzymes: No results for input(s): CKTOTAL, CKMB, CKMBINDEX, TROPONINI in the last 168 hours. BNP: Invalid input(s): POCBNP CBG: Recent Labs  Lab 01/15/20 0023 01/15/20 1213 01/15/20 2005 01/16/20 0550 01/16/20 1123  GLUCAP 112* 98 117* 80 127*   D-Dimer No results for input(s): DDIMER in  the last 72 hours. Hgb A1c No results for input(s): HGBA1C in the last 72 hours. Lipid Profile No results for input(s): CHOL, HDL, LDLCALC, TRIG, CHOLHDL, LDLDIRECT in the last 72 hours. Thyroid function studies No results for input(s): TSH, T4TOTAL, T3FREE, THYROIDAB in the last 72 hours.  Invalid input(s): FREET3 Anemia work up No results for input(s): VITAMINB12, FOLATE, FERRITIN, TIBC, IRON, RETICCTPCT in the last 72 hours. Urinalysis    Component Value Date/Time   COLORURINE YELLOW 12/11/2019 1337  APPEARANCEUR CLEAR 12/11/2019 1337   LABSPEC 1.018 12/11/2019 1337   PHURINE 5.0 12/11/2019 1337   GLUCOSEU NEGATIVE 12/11/2019 1337   HGBUR NEGATIVE 12/11/2019 1337   BILIRUBINUR NEGATIVE 12/11/2019 1337   KETONESUR NEGATIVE 12/11/2019 1337   PROTEINUR 30 (A) 12/11/2019 1337   UROBILINOGEN 0.2 11/12/2010 2324   NITRITE NEGATIVE 12/11/2019 1337   LEUKOCYTESUR NEGATIVE 12/11/2019 1337   Sepsis Labs Invalid input(s): PROCALCITONIN,  WBC,  LACTICIDVEN   Time coordinating discharge: 40 minutes  SIGNED:  Mercy Riding, MD  [facility] 01/16/2020, 4:47 PM  If 7PM-7AM, please contact night-coverage [email or web address] Password [email or web address]

## 2020-01-16 NOTE — Progress Notes (Signed)
Patient advised that the biopsy results from yesterday's EUS is still pending.  The patient is not having abdominal pain and is really without any acute complaints.  She does not feel ready to advance her diet from clear liquids to soft diet yet, but I wrote an order to advance as tolerated.  On exam, the abdomen is nontender and she is lying in bed in no evident distress.  Impression:  1.  Gallstone pancreatitis as clinical presentation, resolved  2.  Incidental finding of paragastric mass in a patient with a prior history of a GIST tumor 10 years ago, biopsied by EUS yesterday since regular endoscopy did not show any significant intraluminal abnormalities and mucosal biopsies at that time were negative    Plan:  1.  Await FNA biopsies  2.  We will follow at a distance.  Please call us at any time if we can be of further assistance in this patient's care.  Cleotis Nipper, M.D. Pager [phone] If no answer or after 5 PM call [phone]

## 2020-01-16 NOTE — Progress Notes (Signed)
Occupational Therapy Treatment Patient Details Name: Monique Lin MRN: [id number] DOB: [birth year] Today's Date: 01/16/2020    History of present illness 86 y.o. female with history of GI stromal tumor status post resection in 2011 with history of hypertension hypothyroidism who was recently admitted for possible cardiac arrest requiring CPR for 1 minute at this time patient was found to be in anemia was transfused and discharged home started experiencing severe epigastric pain since yesterday. CT scan showing necrotic gastric tumor and also CBD stone with pancreatitis. 2/6 GI noted pt improved and likely passed the stone. S/p EGD 2/11.   OT comments  Pt making minimal progress towards OT goals this session. Pt continues to be limited by decreased activity tolerance and generalized weakness c/o stomach pain this session. Pt required MAX encouragement to participate in session and only only to tolerate x2 standpivot transfers with MOD A +2 from EOB>BSC>recliner. Pt required total A for posterior pericare after BM unable to come into full stand. Continue to recommend SNF at time of DC. Will continue to follow acutely per POC.    Follow Up Recommendations  Home health OT;Supervision/Assistance - 24 hour    Equipment Recommendations  3 in 1 bedside commode    Recommendations for Other Services      Precautions / Restrictions Precautions Precautions: Fall Restrictions Weight Bearing Restrictions: No       Mobility Bed Mobility Overal bed mobility: Needs Assistance Bed Mobility: Supine to Sit     Supine to sit: Mod assist;HOB elevated     General bed mobility comments: Max encouragement to perform as independently as possible, pt hesitant to move c/o neuropathy affecting her. Cues for sequencing; ModA for trunk elevation and scooting hips to EOB  Transfers Overall transfer level: Needs assistance Equipment used: 2 person hand held assist Transfers: Sit to/from [employer] Sit to Stand: Mod assist;+2 physical assistance Stand pivot transfers: Mod assist;+2 physical assistance       General transfer comment: Unable to achieve fully upright standing with assist+1; modA+2 to stand pivot from bed>BSC>recliner with bilat HHA, pt limited by fatigue    Balance Overall balance assessment: Needs assistance Sitting-balance support: No upper extremity supported Sitting balance-Leahy Scale: Fair     Standing balance support: Bilateral upper extremity supported;During functional activity Standing balance-Leahy Scale: Poor Standing balance comment: reliant on UE and external support                           ADL either performed or assessed with clinical judgement   ADL Overall ADL's : Needs assistance/impaired   Eating/Feeding Details (indicate cue type and reason): pt reports her dtr assists with self- feeding. pt reports she is able to grip utensils but fatigues quickly                 Lower Body Dressing: Maximal assistance;Sitting/lateral leans   Toilet Transfer: Moderate assistance;+2 for physical assistance;Stand-pivot;BSC Toilet Transfer Details (indicate cue type and reason): MOD A +2 stand pivot EOB>BSC>recliner Toileting- Clothing Manipulation and Hygiene: Total assistance;+2 for physical assistance;Sit to/from stand       Functional mobility during ADLs: Moderate assistance;+2 for safety/equipment;+2 for physical assistance General ADL Comments: pt limited by weakness and decreased activity tolerance. session focus on functional transfer training from EOB>BSC>recliner needing MOD A +2 as pt quickly fatigues     Vision       Perception     Praxis  Cognition Arousal/Alertness: Awake/alert Behavior During Therapy: Flat affect Overall Cognitive Status: No family/caregiver present to determine baseline cognitive functioning                                 General Comments: Anxious regarding  mobility and pain and neuropathy. Cues for participation and sequencing. Fatigued this session        Exercises     Shoulder Instructions       General Comments Fatigues quickly, needs lots of encouragement    Pertinent Vitals/ Pain       Pain Assessment: Faces Faces Pain Scale: Hurts little more Pain Location: Generalized, especially stomach Pain Descriptors / Indicators: Discomfort;Aching Pain Intervention(s): Monitored during session;Limited activity within patient's tolerance;Repositioned  Home Living                                          Prior Functioning/Environment              Frequency  Min 2X/week        Progress Toward Goals  OT Goals(current goals can now be found in the care plan section)  Progress towards OT goals: Progressing toward goals  Acute Rehab OT Goals Patient Stated Goal: to feel better  OT Goal Formulation: With patient Time For Goal Achievement: 01/26/20 Potential to Achieve Goals: Good  Plan Discharge plan remains appropriate;Frequency remains appropriate    Co-evaluation      Reason for Co-Treatment: Necessary to address cognition/behavior during functional activity;For patient/therapist safety;To address functional/ADL transfers(poor activity tolerance) PT goals addressed during session: Mobility/safety with mobility;Balance;Proper use of DME        AM-PAC OT "6 Clicks" Daily Activity     Outcome Measure   Help from another person eating meals?: A Little Help from another person taking care of personal grooming?: A Little Help from another person toileting, which includes using toliet, bedpan, or urinal?: Total Help from another person bathing (including washing, rinsing, drying)?: A Lot Help from another person to put on and taking off regular upper body clothing?: A Little Help from another person to put on and taking off regular lower body clothing?: Total 6 Click Score: 13    End of Session  Equipment Utilized During Treatment: Gait belt;Other (comment)(BSC)  OT Visit Diagnosis: Muscle weakness (generalized) (M62.81);Other abnormalities of gait and mobility (R26.89)   Activity Tolerance Patient limited by fatigue   Patient Left in chair;with call bell/phone within reach;with chair alarm set;with family/visitor present   Nurse Communication Mobility status;Other (comment)(needs new pure wick)        Time: 3748-2707 OT Time Calculation (min): 47 min  Charges: OT General Charges $OT Visit: 1 Visit OT Treatments $Self Care/Home Management : 8-22 mins  Lanier Clam., COTA/L Acute Rehabilitation Services [phone]    Ihor Gully 01/16/2020, 1:40 PM

## 2020-01-16 NOTE — Anesthesia Postprocedure Evaluation (Signed)
Anesthesia Post Note  Patient: Monique Lin  Procedure(s) Performed: ESOPHAGOGASTRODUODENOSCOPY (EGD) WITH PROPOFOL (N/A ) UPPER ENDOSCOPIC ULTRASOUND (EUS) RADIAL (N/A ) FINE NEEDLE ASPIRATION (FNA) LINEAR     Patient location during evaluation: Endoscopy Anesthesia Type: MAC Level of consciousness: awake and alert Pain management: pain level controlled Vital Signs Assessment: post-procedure vital signs reviewed and stable Respiratory status: spontaneous breathing, nonlabored ventilation, respiratory function stable and patient connected to nasal cannula oxygen Cardiovascular status: stable and blood pressure returned to baseline Postop Assessment: no apparent nausea or vomiting Anesthetic complications: no    Last Vitals:  Vitals:   01/16/20 0734 01/16/20 1628  BP: (!) 116/54 (!) 119/52  Pulse: 80 84  Resp: 16 16  Temp: 37.6 C 37.2 C  SpO2: 96%     Last Pain:  Vitals:   01/16/20 0734  TempSrc: Axillary  PainSc:                  Fred Franzen

## 2020-01-16 NOTE — Progress Notes (Addendum)
HEMATOLOGY-ONCOLOGY PROGRESS NOTE  SUBJECTIVE: The patient is sleeping quietly at the time my visit.  Spoke with nursing who indicates the patient is not having any abdominal pain, nausea, vomiting.  No bleeding has been noted.  She seems to be tolerating her [place] pretty well.  Nursing indicates that the patient might be discharged home later today.   REVIEW OF SYSTEMS:   Noncontributory except as noted in the HPI.  I have reviewed the past medical history, past surgical history, social history and family history with the patient and they are unchanged from previous note.   PHYSICAL EXAMINATION:  Vitals:   01/15/20 2049 01/16/20 0734  BP: (!) 117/48 (!) 116/54  Pulse: 83 80  Resp:  16  Temp: 99.1 F (37.3 C) 99.6 F (37.6 C)  SpO2: 97% 96%   Filed Weights   01/11/20 0500 01/14/20 0500 01/15/20 0500  Weight: 182 lb 8.7 oz (82.8 kg) 184 lb 4.9 oz (83.6 kg) 192 lb 14.4 oz (87.5 kg)    Intake/Output from previous day: 02/11 0701 - 02/12 0700 In: 100 [I.V.:100] Out: 200 [Urine:200]  GENERAL:alert, no distress and comfortable SKIN: skin color, texture, turgor are normal, no rashes or significant lesions LUNGS: clear to auscultation and percussion with normal breathing effort HEART: regular rate & rhythm and no murmurs and no lower extremity edema ABDOMEN:abdomen soft, non-tender and normal bowel sounds Musculoskeletal:no cyanosis of digits and no clubbing  NEURO: alert & oriented x 3 with fluent speech, no focal motor/sensory deficits  LABORATORY DATA:  I have reviewed the data as listed CMP Latest Ref Rng & Units 01/16/2020 01/15/2020 01/14/2020  Glucose 70 - 99 mg/dL 111(H) 104(H) 106(H)  BUN 8 - 23 mg/dL 12 10 10   Creatinine 0.44 - 1.00 mg/dL 1.32(H) 1.22(H) 0.91  Sodium 135 - 145 mmol/L 139 138 139  Potassium 3.5 - 5.1 mmol/L 4.1 4.0 4.1  Chloride 98 - 111 mmol/L 109 107 108  CO2 22 - 32 mmol/L 22 23 22   Calcium 8.9 - 10.3 mg/dL 8.8(L) 9.1 9.1  Total Protein 6.5 -  8.1 g/dL - - -  Total Bilirubin 0.3 - 1.2 mg/dL - - -  Alkaline Phos 38 - 126 U/L - - -  AST 15 - 41 U/L - - -  ALT 0 - 44 U/L - - -    Lab Results  Component Value Date   WBC 12.1 (H) 01/16/2020   HGB 8.9 (L) 01/16/2020   HCT 28.6 (L) 01/16/2020   MCV 92.9 01/16/2020   PLT 297 01/16/2020   NEUTROABS 9.5 (H) 12/15/2019    CXR PA and lateral  Result Date: 12/18/2019 CLINICAL DATA:  Follow-up pneumonia EXAM: CHEST - 2 VIEW COMPARISON:  12/16/2019, 12/11/2019 FINDINGS: Small bilateral pleural effusions which do not appear significantly changed. Persistent left greater than right basilar consolidations. Enlarged cardiomediastinal silhouette with aortic atherosclerosis. No pneumothorax. IMPRESSION: Similar small left greater than right pleural effusions with basilar atelectasis or pneumonia. Mild cardiomegaly. Electronically Signed   By: Donavan Foil M.D.   On: 12/18/2019 19:06   CT ABDOMEN PELVIS W CONTRAST  Result Date: 01/09/2020 CLINICAL DATA:  Abdominal pain EXAM: CT ABDOMEN AND PELVIS WITH CONTRAST TECHNIQUE: Multidetector CT imaging of the abdomen and pelvis was performed using the standard protocol following bolus administration of intravenous contrast. CONTRAST:  132mL OMNIPAQUE IOHEXOL 300 MG/ML  SOLN COMPARISON:  09/20/2010 FINDINGS: Lower chest: Chronic consolidation in the lower lobes is noted left greater than right with changes of bronchiectasis and mucous  plugging. Hepatobiliary: Gallbladder is well distended. Multiple gallstones are seen. Biliary ductal dilatation is noted secondary to a distal common bile duct stone best seen on image number 31 of series 3 and image number 43 of series 6. Pancreas: Pancreas demonstrates some peripancreatic inflammatory change near the head and uncinate process likely related to the distal common bile duct stone and focal pancreatitis. The body and tail are not well visualized due to a large enhancing mass lesion with central necrosis as described  below. Spleen: Spleen appears within normal limits. Adrenals/Urinary Tract: Adrenal glands appear within normal limits. The kidneys demonstrate a normal enhancement pattern. Renal cystic change is noted. Normal excretion of contrast material is seen on delayed images. The bladder is decompressed. Stomach/Bowel: Scattered diverticular change of the colon is noted without evidence of diverticulitis. The colon is within normal limits without obstructive change. The appendix is not well seen and may have been surgically removed. No small bowel obstructive changes are seen. Stomach is decompressed. Along the posterior aspect of the stomach there is a large 11.0 x 10.8 cm peripherally enhancing lesion with central necrosis. This envelops the splenic vein and appears to arise from the posterior aspect of the stomach. Distortion of previously seen surgical clips are noted. The body and tail of the pancreas are enveloped by this lesion is well. Vascular/Lymphatic: Aortic atherosclerosis. No enlarged abdominal or pelvic lymph nodes. Reproductive: Uterus and bilateral adnexa are unremarkable. Other: Small fat containing umbilical hernia is noted. No ascites is seen. Musculoskeletal: Degenerative changes of lumbar spine are noted. IMPRESSION: Large centrally necrotic mass lesion which appears to arise from the posterior aspect of the stomach within envelopment of the splenic vein as well as the body and tail of the pancreas. Patient has a history of prior gastrointestinal stromal tumor removal in 2011 and this likely represents local recurrence in the posterior stomach wall. Cholelithiasis with evidence of a distal common bile duct stone causing mild biliary ductal dilatation and focal pancreatitis involving the head and uncinate process. Chronic consolidation in the lower lobes bilaterally with mucous plugging and bronchiectasis. Electronically Signed   By: Inez Catalina M.D.   On: 01/09/2020 03:35   ECHOCARDIOGRAM  COMPLETE  Result Date: 01/13/2020    ECHOCARDIOGRAM REPORT   Patient Name:   Monique Lin Date of Exam: 01/13/2020 Medical Rec #:  [id number]    Height:       62.5 in Accession #:    [id number]   Weight:       182.5 lb Date of Birth:  10/21/1927     BSA:          1.85 m Patient Age:    86 years     BP:           155/70 mmHg Patient Gender: F            HR:           93 bpm. Exam Location:  Inpatient Procedure: 2D Echo Indications:     Chemotherapy evaluation v87.41 / v58.11  History:         Patient has prior history of Echocardiogram examinations, most                  recent 04/12/2018. Risk Factors:Hypertension. Thyroid disease.                  Anemia. Recent cardiac arrest. Acute kidney injury.  Sonographer:     [place] Referring Phys:  [phone]  Rudell Cobb Redford Behrle Diagnosing Phys: [name] MD IMPRESSIONS  1. Left ventricular ejection fraction, by estimation, is 55 to 60%. The left ventricle has normal function. The left ventrical has no regional wall motion abnormalities. There is mildly increased left ventricular hypertrophy. Left ventricular diastolic parameters are consistent with Grade II diastolic dysfunction (pseudonormalization).  2. Right ventricular systolic function is normal. The right ventricular size is normal.  3. Trivial mitral valve regurgitation.  4. The aortic valve is tricuspid. Aortic valve regurgitation is not visualized. Mild aortic valve sclerosis is present, with no evidence of aortic valve stenosis.  5. The inferior vena cava is normal in size with greater than 50% respiratory variability, suggesting right atrial pressure of 3 mmHg. FINDINGS  Left Ventricle: Left ventricular ejection fraction, by estimation, is 55 to 60%. The left ventricle has normal function. The left ventricle has no regional wall motion abnormalities. The left ventricular internal cavity size was normal in size. There is  mildly increased left ventricular hypertrophy. Concentric left ventricular hypertrophy.  Left ventricular diastolic parameters are consistent with Grade II diastolic dysfunction (pseudonormalization). Right Ventricle: The right ventricular size is normal. No increase in right ventricular wall thickness. Right ventricular systolic function is normal. Left Atrium: Left atrial size was normal in size. Right Atrium: Right atrial size was normal in size. Pericardium: Trivial pericardial effusion is present. The pericardial effusion is circumferential. There is no evidence of cardiac tamponade. Mitral Valve: The mitral valve is normal in structure and function. Trivial mitral valve regurgitation. Tricuspid Valve: The tricuspid valve is normal in structure. Tricuspid valve regurgitation is not demonstrated. Aortic Valve: The aortic valve is tricuspid. Aortic valve regurgitation is not visualized. Mild aortic valve sclerosis is present, with no evidence of aortic valve stenosis. There is moderate calcification of the aortic valve. Pulmonic Valve: The pulmonic valve was normal in structure. Pulmonic valve regurgitation is trivial. Aorta: The aortic root and ascending aorta are structurally normal, with no evidence of dilitation. Venous: The inferior vena cava was not well visualized. The inferior vena cava is normal in size with greater than 50% respiratory variability, suggesting right atrial pressure of 3 mmHg. IAS/Shunts: The interatrial septum was not assessed.  LEFT VENTRICLE PLAX 2D LVIDd:         3.20 cm  Diastology LVIDs:         2.30 cm  LV e' lateral:   7.29 cm/s LV PW:         1.00 cm  LV E/e' lateral: 12.0 LV IVS:        1.10 cm  LV e' medial:    5.77 cm/s LVOT diam:     1.80 cm  LV E/e' medial:  15.2 LV SV:         56.24 ml LV SV Index:   11.74 LVOT Area:     2.54 cm  RIGHT VENTRICLE RV S prime:     19.90 cm/s TAPSE (M-mode): 2.4 cm LEFT ATRIUM         Index LA diam:    2.90 cm 1.57 cm/m  AORTIC VALVE LVOT Vmax:   109.00 cm/s LVOT Vmean:  75.600 cm/s LVOT VTI:    0.221 m  AORTA Ao Root diam: 3.00  cm Ao Asc diam:  3.30 cm MITRAL VALVE MV Area (PHT): 4.60 cm             SHUNTS MV Decel Time: 165 msec             Systemic VTI:  0.22 m MV E velocity: 87.70 cm/s 103 cm/s  Systemic Diam: 1.80 cm MV A velocity: 78.80 cm/s 70.3 cm/s MV E/A ratio:  1.11       1.5 [name] MD Electronically signed by [name] MD Signature Date/Time: 01/13/2020/1:01:49 PM    Final     ASSESSMENT AND PLAN: 1.  Large centrally necrotic mass in the stomach concerning for recurrent GIST; patient has a history of GIST and is status post resection in 2011 2.  Acute calculus pancreatitis, resolved 3.  Anemia 4.  Leukocytosis 5.  CKD 6.  Hypertension 7.  Hypothyroidism  -The patient has unresectable necrotic mass in the stomach starting for recurrent GIST.  Biopsy from 01/12/2020 was nondiagnostic.  She underwent EUS on 01/15/2020 and pathology results are currently pending.  She was started on Gleevec 400 mg daily for highly suspicious recurrent GIST.  She is tolerating this medication well.  Recommend for her to continue this medication. -The patient has stable anemia likely due to underlying malignancy and iron deficiency.  Ferritin is normal however iron level, TIBC, and percent saturation are all low.  Status post Feraheme 510 mg on 01/11/2020.    Hemoglobin is overall stable.  We can consider repeating a dose of Feraheme 510 mg IV as an outpatient. -White blood cell count remains mildly elevated.  She remains afebrile without any signs of infection.  We will continue close monitoring. -Renal function has improved and she seems to be back to baseline.  Will monitor this.    LOS: 7 days   Mikey Bussing, DNP, AGPCNP-BC, AOCNP  ADDENDUM: I agree with the above assessment by Erasmo Downer.  I hope that she will start improving from having this gallstone pancreatitis..  She underwent in EUS yesterday of this gastric mass.  Being that the mass is submucosal, and makes biopsy is a little bit more difficult.  I still  feel that we are dealing with a recurrence of her gastrointestinal stromal tumor.  I will keep her on [place].  I sent a prescription to [facility] outpatient pharmacy that deals with oral chemotherapy agents.  Her echocardiogram that she had done 3 days ago looks great.  Her left ventricular ejection fraction is 55-60%.  Her labs look okay.  She is a little bit anemic with a hemoglobin 8.9.  I think she may have got some iron.  It sounds like she might be discharged.  If that is the case, we will follow up with her in our office.  Lattie Haw, MD

## 2020-01-16 NOTE — TOC Transition Note (Signed)
Transition of Care [facility]) - CM/SW Discharge Note   Patient Details  Name: Monique Lin MRN: [id number] Date of Birth: [birth year]  [facility] CM/SW Contact:  Pollie Friar, RN Phone Number: 01/16/2020, 1:45 PM   Clinical Narrative:    Pt discharging home with [facility] services resumed through [facility]. Britney with [facility] aware of d/c home.  No DME needs.  Pt has transportation home after meds are delivered to the room per [place].   Final next level of care: [place] Barriers to Discharge: No Barriers Identified   Patient Goals and CMS Choice        Discharge Placement                       Discharge Plan and Services                                     Social Determinants of Health (SDOH) Interventions     Readmission Risk Interventions No flowsheet data found.

## 2020-01-16 NOTE — Progress Notes (Signed)
1 Day Post-Op   Subjective/Chief Complaint: Sleeping today   Objective: Vital signs in last 24 hours: Temp:  [98.6 F (37 C)-99.7 F (37.6 C)] 99.6 F (37.6 C) (02/12 0734) Pulse Rate:  [80-98] 80 (02/12 0734) Resp:  [16-20] 16 (02/12 0734) BP: (99-154)/(36-67) 116/54 (02/12 0734) SpO2:  [92 %-100 %] 96 % (02/12 0734) Last BM Date: 01/15/20  Intake/Output from previous day: 02/11 0701 - 02/12 0700 In: 100 [I.V.:100] Out: 200 [Urine:200] Intake/Output this shift: No intake/output data recorded.  GI: nontender  Lab Results:  Recent Labs    01/15/20 0233 01/16/20 0114  WBC 13.8* 12.1*  HGB 8.8* 8.9*  HCT 28.1* 28.6*  PLT 278 297   BMET Recent Labs    01/15/20 0233 01/16/20 0114  NA 138 139  K 4.0 4.1  CL 107 109  CO2 23 22  GLUCOSE 104* 111*  BUN 10 12  CREATININE 1.22* 1.32*  CALCIUM 9.1 8.8*   PT/INR No results for input(s): LABPROT, INR in the last 72 hours. ABG No results for input(s): PHART, HCO3 in the last 72 hours.  Invalid input(s): PCO2, PO2  Studies/Results: No results found.  Anti-infectives: Anti-infectives (From admission, onward)   Start     Dose/Rate Route Frequency Ordered Stop   01/10/20 1400  cefTRIAXone (ROCEPHIN) 2 g in sodium chloride 0.9 % 100 mL IVPB  Status:  Discontinued     2 g 200 mL/hr over 30 Minutes Intravenous Every 24 hours 01/10/20 1213 01/13/20 0706   01/09/20 1830  ciprofloxacin (CIPRO) IVPB 400 mg  Status:  Discontinued     400 mg 200 mL/hr over 60 Minutes Intravenous Every 12 hours 01/09/20 0853 01/10/20 1213   01/09/20 1400  metroNIDAZOLE (FLAGYL) IVPB 500 mg  Status:  Discontinued     500 mg 100 mL/hr over 60 Minutes Intravenous Every 8 hours 01/09/20 0611 01/13/20 0706   01/09/20 0630  ciprofloxacin (CIPRO) IVPB 400 mg     400 mg 200 mL/hr over 60 Minutes Intravenous  Once 01/09/20 0616 01/09/20 0909   01/09/20 0445  cefTRIAXone (ROCEPHIN) 1 g in sodium chloride 0.9 % 100 mL IVPB     1 g 200 mL/hr over  30 Minutes Intravenous  Once 01/09/20 0432 01/09/20 0516   01/09/20 0445  metroNIDAZOLE (FLAGYL) IVPB 500 mg     500 mg 100 mL/hr over 60 Minutes Intravenous  Once 01/09/20 2376 01/09/20 2831      Assessment/Plan: GallstonePancreatitis - resolved now clinically and chemically, question is what to do long term in 86 yo with recurrent unresectable gist. Options are just follow her, surgery,or ercp with sphincterotomy which might be most reasonable option.  -she has not really been amenable to discussing surgery so I think that ercp with sphincterotomy would be best option to discuss with her unless she declines any intervention Recurrent GIST - not resectable,biopsies pending.  Oncology saw her and suggested initiation of Gleevac. FEN -soft diet VTE -Lovenox Will see again on Monday if here  Rolm Bookbinder 01/16/2020

## 2020-01-19 ENCOUNTER — Telehealth: Payer: Self-pay | Admitting: Pharmacist

## 2020-01-19 DIAGNOSIS — C49A2 Gastrointestinal stromal tumor of stomach: Secondary | ICD-10-CM

## 2020-01-19 MED ORDER — IMATINIB MESYLATE 400 MG PO TABS: 400.0000 mg | ORAL_TABLET | Freq: Every day | ORAL | 5 refills | Status: DC

## 2020-01-19 NOTE — Telephone Encounter (Signed)
Oral Chemotherapy Pharmacist Encounter  Due to insurance restriction the medication could not be filled at [facility]. Prescription has been e-scribed to [facility].  Supportive information was faxed to [place]. We will continue to follow medication access.   Darl Pikes, PharmD, BCPS, [facility] Hematology/Oncology Clinical Pharmacist [facility] [phone]  01/19/2020 10:38 AM

## 2020-01-19 NOTE — Telephone Encounter (Signed)
Oral Oncology Pharmacist Encounter  Received new prescription for Gleevec (imatinib) for the treatment of recurrent GIST, planned duration until disease progression or unacceptable drug toxicity. Patient was started on imatinib while in the hospital and has no been discharged.  CMP from 01/13/20 assessed, CrCl ~29. Based on the patient's CrCl, the recommendation is to start at 50% of the starting dose and increase to a max for of 400mg  as tolerated. Patient was started on imatinib while inpatient at 400mg  and is reported to be tolerating the imatinib. Prescription dose and frequency assessed.   Current medication list in Epic reviewed, one relevant DDIs with imatinib identified: -Imatinib may increase the concentration of atorvastatin. Monitor patient for increased AE related to atorvastatin (ie. Muscle pain). No baseline dose adjusted required.  Prescription has been e-scribed to the [facility] for benefits analysis and approval.  Oral Oncology Clinic will continue to follow for insurance authorization, copayment issues, initial counseling and start date.  Darl Pikes, PharmD, BCPS, [facility] Hematology/Oncology Clinical Pharmacist [facility] [phone]  01/19/2020 9:54 AM

## 2020-01-20 LAB — CYTOLOGY - NON PAP

## 2020-01-29 ENCOUNTER — Other Ambulatory Visit: Payer: Self-pay | Admitting: *Deleted

## 2020-01-29 DIAGNOSIS — C49A2 Gastrointestinal stromal tumor of stomach: Secondary | ICD-10-CM

## 2020-01-30 ENCOUNTER — Encounter: Payer: Self-pay | Admitting: Hematology & Oncology

## 2020-01-30 ENCOUNTER — Other Ambulatory Visit: Payer: Self-pay

## 2020-01-30 ENCOUNTER — Inpatient Hospital Stay (HOSPITAL_BASED_OUTPATIENT_CLINIC_OR_DEPARTMENT_OTHER): Payer: Medicare Other | Admitting: Hematology & Oncology

## 2020-01-30 ENCOUNTER — Inpatient Hospital Stay: Payer: Medicare Other | Attending: Hematology & Oncology

## 2020-01-30 VITALS — BP 173/88 | HR 88 | Temp 97.3°F | Resp 19 | Wt 171.0 lb

## 2020-01-30 DIAGNOSIS — Z79899 Other long term (current) drug therapy: Secondary | ICD-10-CM | POA: Insufficient documentation

## 2020-01-30 DIAGNOSIS — C49A2 Gastrointestinal stromal tumor of stomach: Secondary | ICD-10-CM | POA: Diagnosis present

## 2020-01-30 LAB — CMP (CANCER CENTER ONLY)
ALT: 9 U/L (ref 0–44)
AST: 18 U/L (ref 15–41)
Albumin: 3.4 g/dL — ABNORMAL LOW (ref 3.5–5.0)
Alkaline Phosphatase: 76 U/L (ref 38–126)
Anion gap: 9 (ref 5–15)
BUN: 11 mg/dL (ref 8–23)
CO2: 27 mmol/L (ref 22–32)
Calcium: 9.4 mg/dL (ref 8.9–10.3)
Chloride: 108 mmol/L (ref 98–111)
Creatinine: 0.88 mg/dL (ref 0.44–1.00)
GFR, Est AFR Am: >60 mL/min (ref >60)
GFR, Estimated: 58 mL/min — ABNORMAL LOW (ref >60)
Glucose, Bld: 96 mg/dL (ref 70–99)
Potassium: 3.5 mmol/L (ref 3.5–5.1)
Sodium: 144 mmol/L (ref 135–145)
Total Bilirubin: 0.4 mg/dL (ref 0.3–1.2)
Total Protein: 6.8 g/dL (ref 6.5–8.1)

## 2020-01-30 LAB — CBC WITH DIFFERENTIAL (CANCER CENTER ONLY)
Abs Immature Granulocytes: 0.06 10*3/uL (ref 0.00–0.07)
Basophils Absolute: 0 10*3/uL (ref 0.0–0.1)
Basophils Relative: 1 %
Eosinophils Absolute: 0.1 10*3/uL (ref 0.0–0.5)
Eosinophils Relative: 2 %
HCT: 33.1 % — ABNORMAL LOW (ref 36.0–46.0)
Hemoglobin: 10.4 g/dL — ABNORMAL LOW (ref 12.0–15.0)
Immature Granulocytes: 1 %
Lymphocytes Relative: 30 %
Lymphs Abs: 1.8 10*3/uL (ref 0.7–4.0)
MCH: 29.5 pg (ref 26.0–34.0)
MCHC: 31.4 g/dL (ref 30.0–36.0)
MCV: 94 fL (ref 80.0–100.0)
Monocytes Absolute: 0.5 10*3/uL (ref 0.1–1.0)
Monocytes Relative: 9 %
Neutro Abs: 3.4 10*3/uL (ref 1.7–7.7)
Neutrophils Relative %: 57 %
Platelet Count: 319 10*3/uL (ref 150–400)
RBC: 3.52 MIL/uL — ABNORMAL LOW (ref 3.87–5.11)
RDW: 18.8 % — ABNORMAL HIGH (ref 11.5–15.5)
WBC Count: 6 10*3/uL (ref 4.0–10.5)
nRBC: 0 % (ref 0.0–0.2)

## 2020-01-30 MED ORDER — CARVEDILOL 6.25 MG PO TABS: 6.2500 mg | ORAL_TABLET | Freq: Two times a day (BID) | ORAL | 0 refills | Status: AC

## 2020-01-30 MED ORDER — PANTOPRAZOLE SODIUM 40 MG PO TBEC: 40.0000 mg | DELAYED_RELEASE_TABLET | Freq: Two times a day (BID) | ORAL | 0 refills | Status: DC

## 2020-01-30 NOTE — Progress Notes (Signed)
Hematology and Oncology Follow Up Visit  Monique Lin [id number] 1926/12/14 86 y.o. 01/30/2020   Principle Diagnosis:   Recurrent GIST of the stomach  Current Therapy:    Gleevec 400 mg p.o. daily-to start on 01/31/2020     Interim History:  Monique Lin is in for her first office visit.  I saw her in consultation at [facility] back in February.  At that time, she had come in with decrease in appetite.  I think she was also having some bleeding.  She had a CT scan that showed a large mass along the posterior aspect of the stomach.  This measured 11 x 10.8 cm.  It envelops the splenic vein.  Also noted was a distended gallbladder with multiple gallstones.  She had biliary ductal dilation.  She has some pancreatic inflammatory changes likely from a gallstone.  She ultimately underwent a upper endoscopy with endoscopic ultrasound.  This was done on 01/15/2020.  The pathology report ([id number]) may spindle cell neoplasm consistent with a gastrointestinal stromal tumor.  It was CD117 positive.  She subsequently has a recurrent stromal tumor.  Her initial gastrointestinal stromal tumor was back in 2011.  She had surgery for this but did not require any adjuvant therapy which probably was not even given at that time.  I think the hospitalization was mostly because of the pancreatitis from the gallstones.  We did start her on some Gleevec in the hospital.  Unfortunately, as an outpatient she has not started yet because of shipping issues.  Her daughter said that this arrived today.  I told her to start taking 400 mg p.o. every other day for 2 weeks and then start taking it daily.  Despite her advanced age, she is incredibly sharp.  She still has a very good memory.  She is very funny.  She has an incredible faith.  We were able to give her a prayer blanket.  She needs a medication adjustment.  I told her she needs oral iron.  I think this will just irritate her stomach.  She has not had any  diarrhea.  There is been no leg swelling.  She has had no fever.  There is no cough.  She does have some chronic bronchitis.  She is on inhalers.  Overall, I would have to say that her performance status is probably ECOG 2.  Medications:  Current Outpatient Medications:  .  acetaminophen (TYLENOL) 500 MG tablet, Take 1 tablet (500 mg total) by mouth every 4 (four) hours as needed (pain)., Disp: 30 tablet, Rfl: 0 .  albuterol (PROVENTIL) (2.5 MG/3ML) 0.083% nebulizer solution, Take 2.5 mg by nebulization every 6 (six) hours as needed for wheezing., Disp: , Rfl:  .  albuterol (VENTOLIN HFA) 108 (90 Base) MCG/ACT inhaler, Inhale 1 puff into the lungs every 6 (six) hours as needed for wheezing or shortness of breath., Disp: , Rfl:  .  alum & mag hydroxide-simeth (MAALOX/MYLANTA) 200-200-20 MG/5ML suspension, Take 30 mLs by mouth every 6 (six) hours as needed for indigestion, heartburn or flatulence., Disp: 355 mL, Rfl: 0 .  atorvastatin (LIPITOR) 20 MG tablet, Take 20 mg by mouth daily., Disp: , Rfl:  .  bisacodyl (DULCOLAX) 5 MG EC tablet, Take 1 tablet (5 mg total) by mouth daily as needed for moderate constipation., Disp: 30 tablet, Rfl: 0 .  carvedilol (COREG) 6.25 MG tablet, Take 1 tablet (6.25 mg total) by mouth 2 (two) times daily with a meal., Disp: 14 tablet, Rfl: 0 .  Cholecalciferol (VITAMIN D-3) 125 MCG (5000 UT) TABS, Take 5,000 Units by mouth daily. , Disp: , Rfl:  .  Cyanocobalamin (VITAMIN B-12 PO), Take 500 mcg by mouth daily. , Disp: , Rfl:  .  feeding supplement, ENSURE ENLIVE, (ENSURE ENLIVE) LIQD, Take 237 mLs by mouth 2 (two) times daily between meals., Disp: 14220 mL, Rfl: 2 .  fluticasone (FLONASE) 50 MCG/ACT nasal spray, Place 2 sprays into both nostrils daily as needed for allergies or rhinitis. , Disp: , Rfl:  .  folic acid (FOLVITE) 948 MCG tablet, Take 800 mcg by mouth daily. , Disp: , Rfl:  .  gabapentin (NEURONTIN) 300 MG capsule, Take 1 capsule (300 mg total) by mouth  2 (two) times daily., Disp: 180 capsule, Rfl: 1 .  imatinib (GLEEVEC) 400 MG tablet, Take 1 tablet (400 mg total) by mouth daily with breakfast. Take with meals and large glass of water.Caution:Chemotherapy, Disp: 30 tablet, Rfl: 5 .  levothyroxine (SYNTHROID, LEVOTHROID) 50 MCG tablet, Take 50 mcg by mouth daily., Disp: , Rfl:  .  pantoprazole (PROTONIX) 40 MG tablet, Take 1 tablet (40 mg total) by mouth 2 (two) times daily., Disp: 20 tablet, Rfl: 0 .  trolamine salicylate (ASPERCREME) 10 % cream, Apply 1 application topically as needed for muscle pain., Disp: , Rfl:  .  vitamin E 180 MG (400 UNITS) capsule, Take 400 Units by mouth daily., Disp: , Rfl:   Allergies:  Allergies  Allergen Reactions  . Codeine Other (See Comments)    Stomach cramps  . Penicillins Other (See Comments)    "Bad stomach cramps" Has patient had a PCN reaction causing immediate rash, facial/tongue/throat swelling, SOB or lightheadedness with hypotension: Unk Has patient had a PCN reaction causing severe rash involving mucus membranes or skin necrosis: Unk Has patient had a PCN reaction that required hospitalization: Unk Has patient had a PCN reaction occurring within the last 10 years: No If all of the above answers are "NO", then may proceed with Cephalosporin use.      Past Medical History, Surgical history, Social history, and Family History were reviewed and updated.  Review of Systems: Review of Systems  Constitutional: Negative.   HENT:  Negative.   Eyes: Negative.   Respiratory: Positive for wheezing.   Cardiovascular: Negative.   Gastrointestinal: Positive for abdominal pain.  Endocrine: Negative.   Genitourinary: Negative.    Musculoskeletal: Negative.   Skin: Negative.   Neurological: Negative.   Hematological: Negative.   Psychiatric/Behavioral: Negative.     Physical Exam:  weight is 171 lb (77.6 kg). Her temporal temperature is 97.3 F (36.3 C) (abnormal). Her blood pressure is 173/88  (abnormal) and her pulse is 88. Her respiration is 19 and oxygen saturation is 98%.   Wt Readings from Last 3 Encounters:  01/30/20 171 lb (77.6 kg)  01/15/20 192 lb 14.4 oz (87.5 kg)  12/11/19 175 lb (79.4 kg)    Physical Exam Vitals reviewed.  HENT:     Head: Normocephalic and atraumatic.  Eyes:     Pupils: Pupils are equal, round, and reactive to light.  Cardiovascular:     Rate and Rhythm: Normal rate and regular rhythm.     Heart sounds: Normal heart sounds.  Pulmonary:     Effort: Pulmonary effort is normal.     Breath sounds: Normal breath sounds.  Abdominal:     General: Bowel sounds are normal.     Palpations: Abdomen is soft.  Musculoskeletal:  General: No tenderness or deformity. Normal range of motion.     Cervical back: Normal range of motion.  Lymphadenopathy:     Cervical: No cervical adenopathy.  Skin:    General: Skin is warm and dry.     Findings: No erythema or rash.  Neurological:     Mental Status: She is alert and oriented to person, place, and time.  Psychiatric:        Behavior: Behavior normal.        Thought Content: Thought content normal.        Judgment: Judgment normal.      Lab Results  Component Value Date   WBC 6.0 01/30/2020   HGB 10.4 (L) 01/30/2020   HCT 33.1 (L) 01/30/2020   MCV 94.0 01/30/2020   PLT 319 01/30/2020     Chemistry      Component Value Date/Time   NA 144 01/30/2020 1405   K 3.5 01/30/2020 1405   CL 108 01/30/2020 1405   CO2 27 01/30/2020 1405   BUN 11 01/30/2020 1405   CREATININE 0.88 01/30/2020 1405      Component Value Date/Time   CALCIUM 9.4 01/30/2020 1405   ALKPHOS 76 01/30/2020 1405   AST 18 01/30/2020 1405   ALT 9 01/30/2020 1405   BILITOT 0.4 01/30/2020 1405      Impression and Plan: Ms. Artz is a 86 year old African-American female.  She has a recurrent gastrointestinal stromal tumor of the stomach.  This is not operable.  I would like to think that [place] will be able to shrink  this and get it under better control.  Hopefully, she will not have problems with the [place].  Her blood counts are doing okay right now so I would think that this should not be a problem.  Again, we have to remember that she is quite elderly.  Her quality life is the top priority.  Again I really believe that [place] will be able to help.  I probably would not do another CT scan on her for about 3 or 4 months.  I would like to see her back in a month or so.  We will have to be very cautious with respect to her having a potential GI bleed.  I spent about 45 minutes with she and her daughter.  This was her first office visit.  I want to make sure I went over the lab reports and the pathology reports.  I answered all their questions.  I had to call in some medications for them so that she could have them before the mail order medicines came in.   Volanda Napoleon, MD 2/26/20213:17 PM

## 2020-03-10 ENCOUNTER — Inpatient Hospital Stay: Payer: Medicare Other | Attending: Hematology & Oncology

## 2020-03-10 ENCOUNTER — Encounter: Payer: Self-pay | Admitting: Hematology & Oncology

## 2020-03-10 ENCOUNTER — Inpatient Hospital Stay (HOSPITAL_BASED_OUTPATIENT_CLINIC_OR_DEPARTMENT_OTHER): Payer: Medicare Other | Admitting: Hematology & Oncology

## 2020-03-10 ENCOUNTER — Other Ambulatory Visit: Payer: Self-pay

## 2020-03-10 VITALS — BP 144/65 | HR 92 | Temp 97.3°F | Resp 20 | Ht 63.0 in | Wt 179.1 lb

## 2020-03-10 DIAGNOSIS — Z79899 Other long term (current) drug therapy: Secondary | ICD-10-CM | POA: Diagnosis not present

## 2020-03-10 DIAGNOSIS — C49A2 Gastrointestinal stromal tumor of stomach: Secondary | ICD-10-CM | POA: Diagnosis present

## 2020-03-10 DIAGNOSIS — D490 Neoplasm of unspecified behavior of digestive system: Secondary | ICD-10-CM

## 2020-03-10 DIAGNOSIS — C49A0 Gastrointestinal stromal tumor, unspecified site: Secondary | ICD-10-CM

## 2020-03-10 DIAGNOSIS — Z15068 Genetic susceptibility to other malignant neoplasm of digestive system: Secondary | ICD-10-CM | POA: Insufficient documentation

## 2020-03-10 HISTORY — DX: Gastrointestinal stromal tumor, unspecified site: C49.A0

## 2020-03-10 LAB — CBC WITH DIFFERENTIAL (CANCER CENTER ONLY)
Abs Immature Granulocytes: 0.02 10*3/uL (ref 0.00–0.07)
Basophils Absolute: 0 10*3/uL (ref 0.0–0.1)
Basophils Relative: 1 %
Eosinophils Absolute: 0.2 10*3/uL (ref 0.0–0.5)
Eosinophils Relative: 4 %
HCT: 33.9 % — ABNORMAL LOW (ref 36.0–46.0)
Hemoglobin: 10.9 g/dL — ABNORMAL LOW (ref 12.0–15.0)
Immature Granulocytes: 0 %
Lymphocytes Relative: 47 %
Lymphs Abs: 2.4 10*3/uL (ref 0.7–4.0)
MCH: 30.2 pg (ref 26.0–34.0)
MCHC: 32.2 g/dL (ref 30.0–36.0)
MCV: 93.9 fL (ref 80.0–100.0)
Monocytes Absolute: 0.4 10*3/uL (ref 0.1–1.0)
Monocytes Relative: 8 %
Neutro Abs: 2 10*3/uL (ref 1.7–7.7)
Neutrophils Relative %: 40 %
Platelet Count: 158 10*3/uL (ref 150–400)
RBC: 3.61 MIL/uL — ABNORMAL LOW (ref 3.87–5.11)
RDW: 18.4 % — ABNORMAL HIGH (ref 11.5–15.5)
WBC Count: 5.1 10*3/uL (ref 4.0–10.5)
nRBC: 0 % (ref 0.0–0.2)

## 2020-03-10 LAB — CMP (CANCER CENTER ONLY)
ALT: 8 U/L (ref 0–44)
AST: 15 U/L (ref 15–41)
Albumin: 2.9 g/dL — ABNORMAL LOW (ref 3.5–5.0)
Alkaline Phosphatase: 71 U/L (ref 38–126)
Anion gap: 8 (ref 5–15)
BUN: 16 mg/dL (ref 8–23)
CO2: 23 mmol/L (ref 22–32)
Calcium: 8.8 mg/dL — ABNORMAL LOW (ref 8.9–10.3)
Chloride: 111 mmol/L (ref 98–111)
Creatinine: 1.59 mg/dL — ABNORMAL HIGH (ref 0.44–1.00)
GFR, Est AFR Am: 33 mL/min — ABNORMAL LOW (ref >60)
GFR, Estimated: 28 mL/min — ABNORMAL LOW (ref >60)
Glucose, Bld: 94 mg/dL (ref 70–99)
Potassium: 3.9 mmol/L (ref 3.5–5.1)
Sodium: 142 mmol/L (ref 135–145)
Total Bilirubin: 0.5 mg/dL (ref 0.3–1.2)
Total Protein: 5.6 g/dL — ABNORMAL LOW (ref 6.5–8.1)

## 2020-03-10 LAB — SAMPLE TO BLOOD BANK

## 2020-03-10 MED ORDER — FUROSEMIDE 20 MG PO TABS: 20.0000 mg | ORAL_TABLET | Freq: Every day | ORAL | 6 refills | Status: DC

## 2020-03-10 MED ORDER — POTASSIUM CHLORIDE CRYS ER 20 MEQ PO TBCR: 20.0000 meq | EXTENDED_RELEASE_TABLET | Freq: Every day | ORAL | 5 refills | Status: DC

## 2020-03-10 NOTE — Progress Notes (Signed)
Hematology and Oncology Follow Up Visit  Monique Lin [id number] [birth year] 86 y.o. 03/10/2020   Principle Diagnosis:   Recurrent GIST of the stomach  Current Therapy:    Gleevec 400 mg p.o. daily-to start on 01/31/2020     Interim History:  Monique Lin is in for follow-up. She is doing pretty well. She is on Gleevec. She is doing Gleevec daily now.  She has little bit of edema in her legs. I will go ahead and put her on some Lasix with some potassium. She had been on this before but this has been stopped when she was in the hospital.  She is not having any abdominal pain. She is having no diarrhea. There is no bleeding. There is no melena or bright red blood per rectum.  She has had no issues with cough. There is no chest wall pain.  She is still very cautious with the coronavirus. She has had her vaccines. She is still being very diligent with being careful.  She has had no issues with fever. She has had no headache.  Overall, I would have to say that her performance status is probably ECOG 2-3.   Medications:  Current Outpatient Medications:  .  acetaminophen (TYLENOL) 325 MG tablet, Take 650 mg by mouth every 6 (six) hours as needed., Disp: , Rfl:  .  albuterol (PROVENTIL) (2.5 MG/3ML) 0.083% nebulizer solution, Take 2.5 mg by nebulization every 6 (six) hours as needed for wheezing., Disp: , Rfl:  .  albuterol (VENTOLIN HFA) 108 (90 Base) MCG/ACT inhaler, Inhale 1 puff into the lungs every 6 (six) hours as needed for wheezing or shortness of breath., Disp: , Rfl:  .  alum & mag hydroxide-simeth (MAALOX/MYLANTA) 200-200-20 MG/5ML suspension, Take 30 mLs by mouth every 6 (six) hours as needed for indigestion, heartburn or flatulence., Disp: 355 mL, Rfl: 0 .  atorvastatin (LIPITOR) 20 MG tablet, Take 20 mg by mouth daily., Disp: , Rfl:  .  bisacodyl (DULCOLAX) 5 MG EC tablet, Take 1 tablet (5 mg total) by mouth daily as needed for moderate constipation., Disp: 30 tablet, Rfl: 0 .   carvedilol (COREG) 6.25 MG tablet, Take 1 tablet (6.25 mg total) by mouth 2 (two) times daily with a meal., Disp: 14 tablet, Rfl: 0 .  Cholecalciferol (VITAMIN D-3) 125 MCG (5000 UT) TABS, Take 5,000 Units by mouth daily. , Disp: , Rfl:  .  Cyanocobalamin (VITAMIN B-12 PO), Take 500 mcg by mouth daily. , Disp: , Rfl:  .  feeding supplement, ENSURE ENLIVE, (ENSURE ENLIVE) LIQD, Take 237 mLs by mouth 2 (two) times daily between meals., Disp: 14220 mL, Rfl: 2 .  fluticasone (FLONASE) 50 MCG/ACT nasal spray, Place 2 sprays into both nostrils daily as needed for allergies or rhinitis. , Disp: , Rfl:  .  folic acid (FOLVITE) 532 MCG tablet, Take 800 mcg by mouth daily. , Disp: , Rfl:  .  gabapentin (NEURONTIN) 300 MG capsule, Take 1 capsule (300 mg total) by mouth 2 (two) times daily., Disp: 180 capsule, Rfl: 1 .  imatinib (GLEEVEC) 400 MG tablet, Take 1 tablet (400 mg total) by mouth daily with breakfast. Take with meals and large glass of water.Caution:Chemotherapy, Disp: 30 tablet, Rfl: 5 .  levothyroxine (SYNTHROID, LEVOTHROID) 50 MCG tablet, Take 50 mcg by mouth daily., Disp: , Rfl:  .  OVER THE COUNTER MEDICATION, 3 times/day as needed-between meals & bedtime. Hempvana-2 times daily prn. Two [place]- OTC-prn, Disp: , Rfl:  .  pantoprazole (  PROTONIX) 40 MG tablet, Take 1 tablet (40 mg total) by mouth 2 (two) times daily., Disp: 20 tablet, Rfl: 0 .  trolamine salicylate (ASPERCREME) 10 % cream, Apply 1 application topically as needed for muscle pain., Disp: , Rfl:  .  vitamin E 180 MG (400 UNITS) capsule, Take 400 Units by mouth daily., Disp: , Rfl:   Allergies:  Allergies  Allergen Reactions  . Codeine Other (See Comments)    Stomach cramps  . Penicillins Other (See Comments)    "Bad stomach cramps" Has patient had a PCN reaction causing immediate rash, facial/tongue/throat swelling, SOB or lightheadedness with hypotension: Unk Has patient had a PCN reaction causing severe rash involving mucus  membranes or skin necrosis: Unk Has patient had a PCN reaction that required hospitalization: Unk Has patient had a PCN reaction occurring within the last 10 years: No If all of the above answers are "NO", then may proceed with Cephalosporin use.      Past Medical History, Surgical history, Social history, and Family History were reviewed and updated.  Review of Systems: Review of Systems  Constitutional: Negative.   HENT:  Negative.   Eyes: Negative.   Respiratory: Positive for wheezing.   Cardiovascular: Negative.   Gastrointestinal: Positive for abdominal pain.  Endocrine: Negative.   Genitourinary: Negative.    Musculoskeletal: Negative.   Skin: Negative.   Neurological: Negative.   Hematological: Negative.   Psychiatric/Behavioral: Negative.     Physical Exam:  height is 5\' 3"  (1.6 m) and weight is 179 lb 1.9 oz (81.2 kg). Her temporal temperature is 97.3 F (36.3 C) (abnormal). Her blood pressure is 144/65 (abnormal) and her pulse is 92. Her respiration is 20 and oxygen saturation is 99%.   Wt Readings from Last 3 Encounters:  03/10/20 179 lb 1.9 oz (81.2 kg)  01/30/20 171 lb (77.6 kg)  01/15/20 192 lb 14.4 oz (87.5 kg)    Physical Exam Vitals reviewed.  HENT:     Head: Normocephalic and atraumatic.  Eyes:     Pupils: Pupils are equal, round, and reactive to light.  Cardiovascular:     Rate and Rhythm: Normal rate and regular rhythm.     Heart sounds: Normal heart sounds.  Pulmonary:     Effort: Pulmonary effort is normal.     Breath sounds: Normal breath sounds.  Abdominal:     General: Bowel sounds are normal.     Palpations: Abdomen is soft.  Musculoskeletal:        General: No tenderness or deformity. Normal range of motion.     Cervical back: Normal range of motion.  Lymphadenopathy:     Cervical: No cervical adenopathy.  Skin:    General: Skin is warm and dry.     Findings: No erythema or rash.  Neurological:     Mental Status: She is alert and  oriented to person, place, and time.  Psychiatric:        Behavior: Behavior normal.        Thought Content: Thought content normal.        Judgment: Judgment normal.      Lab Results  Component Value Date   WBC 5.1 03/10/2020   HGB 10.9 (L) 03/10/2020   HCT 33.9 (L) 03/10/2020   MCV 93.9 03/10/2020   PLT 158 03/10/2020     Chemistry      Component Value Date/Time   NA 142 03/10/2020 1501   K 3.9 03/10/2020 1501   CL 111 03/10/2020 1501  CO2 23 03/10/2020 1501   BUN 16 03/10/2020 1501   CREATININE 1.59 (H) 03/10/2020 1501      Component Value Date/Time   CALCIUM 8.8 (L) 03/10/2020 1501   ALKPHOS 71 03/10/2020 1501   AST 15 03/10/2020 1501   ALT 8 03/10/2020 1501   BILITOT 0.5 03/10/2020 1501      Impression and Plan: Ms. Raffel is a 86 year old African-American female.  She has a recurrent gastrointestinal stromal tumor of the stomach.  This is not operable.  At this point, we will go ahead with ordering a CT scan when I see her back. We'll get a CT of the abdomen and pelvis in May. Hopefully, we will see that she is showing a response.  The fact that she is not dropping her hemoglobin hopefully is a good indicator that she is responding.  She is not a big eater. She says she just does not eat much. 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  Thankfully, her daughter came with her. It is always fun talking with the 2 of them.  We'll get her back in about 5 weeks and do the CT scan same day that I see her.   Volanda Napoleon, MD 4/7/20213:42 PM

## 2020-03-11 ENCOUNTER — Telehealth: Payer: Self-pay | Admitting: *Deleted

## 2020-03-11 LAB — TSH: TSH: 0.868 u[IU]/mL (ref 0.308–3.960)

## 2020-03-11 LAB — IRON AND TIBC
Iron: 120 ug/dL (ref 41–142)
Saturation Ratios: 92 % — ABNORMAL HIGH (ref 21–57)
TIBC: 131 ug/dL — ABNORMAL LOW (ref 236–444)
UIBC: 11 ug/dL — ABNORMAL LOW (ref 120–384)

## 2020-03-11 LAB — LACTATE DEHYDROGENASE: LDH: 238 U/L — ABNORMAL HIGH (ref 98–192)

## 2020-03-11 LAB — FERRITIN: Ferritin: 381 ng/mL — ABNORMAL HIGH (ref 11–307)

## 2020-03-11 NOTE — Telephone Encounter (Signed)
-----   Message from Volanda Napoleon, MD sent at 03/11/2020 11:01 AM EDT ----- Call - the iron level is great!  Monique Lin

## 2020-03-11 NOTE — Telephone Encounter (Signed)
As noted below by Dr. Marin Olp, I informed the patient that the iron level is great. She verbalized understanding.

## 2020-03-15 ENCOUNTER — Telehealth: Payer: Self-pay | Admitting: *Deleted

## 2020-03-15 ENCOUNTER — Other Ambulatory Visit: Payer: Self-pay | Admitting: *Deleted

## 2020-03-15 DIAGNOSIS — C49A2 Gastrointestinal stromal tumor of stomach: Secondary | ICD-10-CM

## 2020-03-15 MED ORDER — ONDANSETRON HCL 8 MG PO TABS: 8.0000 mg | ORAL_TABLET | Freq: Three times a day (TID) | ORAL | 0 refills | Status: AC | PRN

## 2020-03-15 NOTE — Telephone Encounter (Signed)
Message received from [place] with [facility] to inform Dr. Marin Olp that pt has increased nausea, diarrhea and fatigue for the past two weeks.  Call placed to patient and patient states that she has increased nausea, decreased appetite, diarrhea (4-6 times a day) and fatigue since she started taking Gleevec daily several weeks ago.  Pt states when she took [place] every other day, she did not have these symptoms.  Pt states that she is taking nothing for diarrhea or nausea.  Informed pt that I would speak with Dr. Marin Olp and call her back with MD orders.

## 2020-03-15 NOTE — Telephone Encounter (Signed)
Call placed back to patient to notify her per order of Dr. Marin Olp to start taking Gleevec 400 mg every other day, to take OTC Imodium as directed on the bottle and also that a prescription for Zofran for nausea will be called in to her pharmacy at [facility] on [facility] per her request.  Instructed pt to call office back if Zofran and Imodium do not help with diarrhea and nausea.  Teach back done.  Pt appreciative of call back and has no further questions at this time.

## 2020-03-30 ENCOUNTER — Telehealth: Payer: Self-pay | Admitting: *Deleted

## 2020-03-30 NOTE — Telephone Encounter (Signed)
Message received from patient's daughter, [name] stating that pt has a runny nose and would like to know if patient can take Allegra, d/t Flonase is ineffective.  Dr. Marin Olp notified.  Call placed back to [facility] and [place] notified per order of Dr. Marin Olp that it is ok for pt to take OTC Allegra.  [name] is appreciative of call and has no further questions at this time.

## 2020-04-15 ENCOUNTER — Encounter: Payer: Self-pay | Admitting: Hematology & Oncology

## 2020-04-15 ENCOUNTER — Other Ambulatory Visit: Payer: Self-pay

## 2020-04-15 ENCOUNTER — Inpatient Hospital Stay: Payer: Medicare Other

## 2020-04-15 ENCOUNTER — Inpatient Hospital Stay: Payer: Medicare Other | Attending: Hematology & Oncology | Admitting: Hematology & Oncology

## 2020-04-15 ENCOUNTER — Ambulatory Visit (HOSPITAL_BASED_OUTPATIENT_CLINIC_OR_DEPARTMENT_OTHER)
Admission: RE | Admit: 2020-04-15 | Discharge: 2020-04-15 | Disposition: A | Discharge status: Home / Self Care | Payer: Medicare Other | Source: Ambulatory Visit | Attending: Hematology & Oncology | Admitting: Hematology & Oncology

## 2020-04-15 VITALS — BP 177/68 | HR 95 | Temp 96.9°F | Resp 18 | Wt 177.0 lb

## 2020-04-15 DIAGNOSIS — C49A0 Gastrointestinal stromal tumor, unspecified site: Secondary | ICD-10-CM

## 2020-04-15 DIAGNOSIS — Z79899 Other long term (current) drug therapy: Secondary | ICD-10-CM | POA: Insufficient documentation

## 2020-04-15 DIAGNOSIS — C49A2 Gastrointestinal stromal tumor of stomach: Secondary | ICD-10-CM | POA: Diagnosis present

## 2020-04-15 DIAGNOSIS — D490 Neoplasm of unspecified behavior of digestive system: Secondary | ICD-10-CM | POA: Diagnosis not present

## 2020-04-15 LAB — CBC WITH DIFFERENTIAL (CANCER CENTER ONLY)
Abs Immature Granulocytes: 0.01 10*3/uL (ref 0.00–0.07)
Basophils Absolute: 0 10*3/uL (ref 0.0–0.1)
Basophils Relative: 1 %
Eosinophils Absolute: 0.1 10*3/uL (ref 0.0–0.5)
Eosinophils Relative: 2 %
HCT: 30.9 % — ABNORMAL LOW (ref 36.0–46.0)
Hemoglobin: 10.1 g/dL — ABNORMAL LOW (ref 12.0–15.0)
Immature Granulocytes: 0 %
Lymphocytes Relative: 41 %
Lymphs Abs: 2 10*3/uL (ref 0.7–4.0)
MCH: 30.9 pg (ref 26.0–34.0)
MCHC: 32.7 g/dL (ref 30.0–36.0)
MCV: 94.5 fL (ref 80.0–100.0)
Monocytes Absolute: 0.4 10*3/uL (ref 0.1–1.0)
Monocytes Relative: 8 %
Neutro Abs: 2.4 10*3/uL (ref 1.7–7.7)
Neutrophils Relative %: 48 %
Platelet Count: 207 10*3/uL (ref 150–400)
RBC: 3.27 MIL/uL — ABNORMAL LOW (ref 3.87–5.11)
RDW: 18.2 % — ABNORMAL HIGH (ref 11.5–15.5)
WBC Count: 4.9 10*3/uL (ref 4.0–10.5)
nRBC: 0 % (ref 0.0–0.2)

## 2020-04-15 LAB — CMP (CANCER CENTER ONLY)
ALT: 9 U/L (ref 0–44)
AST: 15 U/L (ref 15–41)
Albumin: 3.3 g/dL — ABNORMAL LOW (ref 3.5–5.0)
Alkaline Phosphatase: 52 U/L (ref 38–126)
Anion gap: 7 (ref 5–15)
BUN: 11 mg/dL (ref 8–23)
CO2: 22 mmol/L (ref 22–32)
Calcium: 9.2 mg/dL (ref 8.9–10.3)
Chloride: 114 mmol/L — ABNORMAL HIGH (ref 98–111)
Creatinine: 1.23 mg/dL — ABNORMAL HIGH (ref 0.44–1.00)
GFR, Est AFR Am: 45 mL/min — ABNORMAL LOW (ref >60)
GFR, Estimated: 39 mL/min — ABNORMAL LOW (ref >60)
Glucose, Bld: 97 mg/dL (ref 70–99)
Potassium: 4.5 mmol/L (ref 3.5–5.1)
Sodium: 143 mmol/L (ref 135–145)
Total Bilirubin: 0.8 mg/dL (ref 0.3–1.2)
Total Protein: 5.9 g/dL — ABNORMAL LOW (ref 6.5–8.1)

## 2020-04-15 MED ORDER — IOHEXOL 300 MG/ML  SOLN
100.0000 mL | Freq: Once | INTRAMUSCULAR | Status: AC | PRN
  Administered 2020-04-15: 75 mL via INTRAVENOUS

## 2020-04-15 NOTE — Progress Notes (Signed)
Hematology and Oncology Follow Up Visit  Monique Lin [id number] 10-13-1927 86 y.o. 04/15/2020   Principle Diagnosis:   Recurrent GIST of the stomach  Current Therapy:    Gleevec 400 mg p.o.q other day-to start on 01/31/2020     Interim History:  [name] is in for follow-up.  She has been doing pretty well.  She did have a nice Mother's Day weekend.  She is taking the Gleevec every other day.  She seems to be tolerating this a lot better than daily.  She has had no diarrhea.  There has been no bleeding.  She has had occasional abdominal discomfort.  There is no cough or shortness of breath.  She has had occasional leg swelling.  She has had no problems with fever.  Prior she has had her coronavirus vaccines.  Currently, I would say her performance status is probably ECOG 2 at best.  Medications:  Current Outpatient Medications:  .  ACETAMINOPHEN PO, Take 650 mg by mouth every 8 (eight) hours as needed. , Disp: , Rfl:  .  albuterol (PROVENTIL) (2.5 MG/3ML) 0.083% nebulizer solution, Take 2.5 mg by nebulization every 6 (six) hours as needed for wheezing., Disp: , Rfl:  .  albuterol (VENTOLIN HFA) 108 (90 Base) MCG/ACT inhaler, Inhale 1 puff into the lungs every 6 (six) hours as needed for wheezing or shortness of breath., Disp: , Rfl:  .  alum & mag hydroxide-simeth (MAALOX/MYLANTA) 200-200-20 MG/5ML suspension, Take 30 mLs by mouth every 6 (six) hours as needed for indigestion, heartburn or flatulence., Disp: 355 mL, Rfl: 0 .  atorvastatin (LIPITOR) 20 MG tablet, Take 20 mg by mouth daily., Disp: , Rfl:  .  bisacodyl (DULCOLAX) 5 MG EC tablet, Take 1 tablet (5 mg total) by mouth daily as needed for moderate constipation., Disp: 30 tablet, Rfl: 0 .  carvedilol (COREG) 6.25 MG tablet, Take 1 tablet (6.25 mg total) by mouth 2 (two) times daily with a meal., Disp: 14 tablet, Rfl: 0 .  Cholecalciferol (VITAMIN D-3) 125 MCG (5000 UT) TABS, Take 5,000 Units by mouth daily. , Disp: , Rfl:  .   Cyanocobalamin (VITAMIN B-12 PO), Take 500 mcg by mouth daily. , Disp: , Rfl:  .  feeding supplement, ENSURE ENLIVE, (ENSURE ENLIVE) LIQD, Take 237 mLs by mouth 2 (two) times daily between meals., Disp: 14220 mL, Rfl: 2 .  fluticasone (FLONASE) 50 MCG/ACT nasal spray, Place 2 sprays into both nostrils daily as needed for allergies or rhinitis. , Disp: , Rfl:  .  folic acid (FOLVITE) 623 MCG tablet, Take 800 mcg by mouth daily. , Disp: , Rfl:  .  furosemide (LASIX) 20 MG tablet, Take 1 tablet (20 mg total) by mouth daily., Disp: 30 tablet, Rfl: 6 .  gabapentin (NEURONTIN) 300 MG capsule, Take 1 capsule (300 mg total) by mouth 2 (two) times daily., Disp: 180 capsule, Rfl: 1 .  imatinib (GLEEVEC) 400 MG tablet, Take 1 tablet (400 mg total) by mouth daily with breakfast. Take with meals and large glass of water.Caution:Chemotherapy, Disp: 30 tablet, Rfl: 5 .  levothyroxine (SYNTHROID, LEVOTHROID) 50 MCG tablet, Take 50 mcg by mouth daily., Disp: , Rfl:  .  ondansetron (ZOFRAN) 8 MG tablet, Take 1 tablet (8 mg total) by mouth every 8 (eight) hours as needed for nausea or vomiting., Disp: 20 tablet, Rfl: 0 .  OVER THE COUNTER MEDICATION, 3 times/day as needed-between meals & bedtime. Hempvana-2 times daily prn. Two [place] Goats- OTC-prn, Disp: , Rfl:  .  pantoprazole (PROTONIX) 40 MG tablet, Take 1 tablet (40 mg total) by mouth 2 (two) times daily., Disp: 20 tablet, Rfl: 0 .  potassium chloride SA (KLOR-CON) 20 MEQ tablet, Take 1 tablet (20 mEq total) by mouth daily., Disp: 30 tablet, Rfl: 5 .  trolamine salicylate (ASPERCREME) 10 % cream, Apply 1 application topically as needed for muscle pain., Disp: , Rfl:  .  vitamin E 180 MG (400 UNITS) capsule, Take 400 Units by mouth daily., Disp: , Rfl:   Allergies:  Allergies  Allergen Reactions  . Codeine Other (See Comments)    Stomach cramps  . Penicillins Other (See Comments)    "Bad stomach cramps" Has patient had a PCN reaction causing immediate rash,  facial/tongue/throat swelling, SOB or lightheadedness with hypotension: Unk Has patient had a PCN reaction causing severe rash involving mucus membranes or skin necrosis: Unk Has patient had a PCN reaction that required hospitalization: Unk Has patient had a PCN reaction occurring within the last 10 years: No If all of the above answers are "NO", then may proceed with Cephalosporin use.      Past Medical History, Surgical history, Social history, and Family History were reviewed and updated.  Review of Systems: Review of Systems  Constitutional: Negative.   HENT:  Negative.   Eyes: Negative.   Respiratory: Positive for wheezing.   Cardiovascular: Negative.   Gastrointestinal: Positive for abdominal pain.  Endocrine: Negative.   Genitourinary: Negative.    Musculoskeletal: Negative.   Skin: Negative.   Neurological: Negative.   Hematological: Negative.   Psychiatric/Behavioral: Negative.     Physical Exam:  weight is 177 lb (80.3 kg). Her temporal temperature is 96.9 F (36.1 C) (abnormal). Her blood pressure is 177/68 (abnormal) and her pulse is 95. Her respiration is 18 and oxygen saturation is 99%.   Wt Readings from Last 3 Encounters:  04/15/20 177 lb (80.3 kg)  03/10/20 179 lb 1.9 oz (81.2 kg)  01/30/20 171 lb (77.6 kg)    Physical Exam Vitals reviewed.  HENT:     Head: Normocephalic and atraumatic.  Eyes:     Pupils: Pupils are equal, round, and reactive to light.  Cardiovascular:     Rate and Rhythm: Normal rate and regular rhythm.     Heart sounds: Normal heart sounds.  Pulmonary:     Effort: Pulmonary effort is normal.     Breath sounds: Normal breath sounds.  Abdominal:     General: Bowel sounds are normal.     Palpations: Abdomen is soft.  Musculoskeletal:        General: No tenderness or deformity. Normal range of motion.     Cervical back: Normal range of motion.  Lymphadenopathy:     Cervical: No cervical adenopathy.  Skin:    General: Skin is  warm and dry.     Findings: No erythema or rash.  Neurological:     Mental Status: She is alert and oriented to person, place, and time.  Psychiatric:        Behavior: Behavior normal.        Thought Content: Thought content normal.        Judgment: Judgment normal.      Lab Results  Component Value Date   WBC 4.9 04/15/2020   HGB 10.1 (L) 04/15/2020   HCT 30.9 (L) 04/15/2020   MCV 94.5 04/15/2020   PLT 207 04/15/2020     Chemistry      Component Value Date/Time   NA 143 04/15/2020  1456   K 4.5 04/15/2020 1456   CL 114 (H) 04/15/2020 1456   CO2 22 04/15/2020 1456   BUN 11 04/15/2020 1456   CREATININE 1.23 (H) 04/15/2020 1456      Component Value Date/Time   CALCIUM 9.2 04/15/2020 1456   ALKPHOS 52 04/15/2020 1456   AST 15 04/15/2020 1456   ALT 9 04/15/2020 1456   BILITOT 0.8 04/15/2020 1456      Impression and Plan: Ms. Addis is a 86 year old African-American female.  She has a recurrent gastrointestinal stromal tumor of the stomach.  This is not operable.  I really do hope that the CT scan will show [place] that the cancer is better.  She has been on the [place] now for about 2 months.  This still may be a little early to know how things are going to work.  She certainly does not appear to be any worse.  Her hemoglobin is holding steady.  We will follow up with the CT scan and call her daughter with the result.  I would like to see her back in about 5-6 weeks and we will see how she is doing at that point.  Volanda Napoleon, MD 5/13/20215:08 PM

## 2020-04-16 ENCOUNTER — Telehealth: Payer: Self-pay | Admitting: *Deleted

## 2020-04-16 LAB — FERRITIN: Ferritin: 327 ng/mL — ABNORMAL HIGH (ref 11–307)

## 2020-04-16 LAB — IRON AND TIBC
Iron: 118 ug/dL (ref 41–142)
Saturation Ratios: 81 % — ABNORMAL HIGH (ref 21–57)
TIBC: 146 ug/dL — ABNORMAL LOW (ref 236–444)
UIBC: 28 ug/dL — ABNORMAL LOW (ref 120–384)

## 2020-04-16 LAB — LACTATE DEHYDROGENASE: LDH: 206 U/L — ABNORMAL HIGH (ref 98–192)

## 2020-04-16 NOTE — Telephone Encounter (Signed)
-----   Message from Volanda Napoleon, MD sent at 04/16/2020 12:01 PM EDT ----- Call her dgtr -- this is amazing!!  Her tumor has shrunk by 60%!!!  God is great!!!!  Laurey Arrow

## 2020-04-16 NOTE — Telephone Encounter (Signed)
As noted below by Dr. Marin Olp, I informed the daughter, [name], that the tumor has shrunk by 60%. She verbalized understanding.

## 2020-04-19 ENCOUNTER — Telehealth: Payer: Self-pay | Admitting: Hematology & Oncology

## 2020-04-19 NOTE — Telephone Encounter (Signed)
Appointments scheduled calendar printed & mailed per 5/13 los

## 2020-05-03 ENCOUNTER — Other Ambulatory Visit: Payer: Self-pay

## 2020-05-03 ENCOUNTER — Emergency Department (HOSPITAL_COMMUNITY): Payer: Medicare Other

## 2020-05-03 ENCOUNTER — Encounter (HOSPITAL_COMMUNITY): Payer: Self-pay | Admitting: Emergency Medicine

## 2020-05-03 ENCOUNTER — Emergency Department (HOSPITAL_COMMUNITY)
Admission: EM | Admit: 2020-05-03 | Discharge: 2020-05-04 | Disposition: A | Discharge status: Home / Self Care | Payer: Medicare Other | Attending: Emergency Medicine | Admitting: Emergency Medicine

## 2020-05-03 DIAGNOSIS — I10 Essential (primary) hypertension: Secondary | ICD-10-CM | POA: Diagnosis not present

## 2020-05-03 DIAGNOSIS — R55 Syncope and collapse: Secondary | ICD-10-CM

## 2020-05-03 DIAGNOSIS — E039 Hypothyroidism, unspecified: Secondary | ICD-10-CM | POA: Diagnosis not present

## 2020-05-03 DIAGNOSIS — Z79899 Other long term (current) drug therapy: Secondary | ICD-10-CM | POA: Diagnosis not present

## 2020-05-03 LAB — COMPREHENSIVE METABOLIC PANEL
ALT: 9 U/L (ref 0–44)
AST: 17 U/L (ref 15–41)
Albumin: 2.5 g/dL — ABNORMAL LOW (ref 3.5–5.0)
Alkaline Phosphatase: 51 U/L (ref 38–126)
Anion gap: 4 — ABNORMAL LOW (ref 5–15)
BUN: 14 mg/dL (ref 8–23)
CO2: 19 mmol/L — ABNORMAL LOW (ref 22–32)
Calcium: 8.3 mg/dL — ABNORMAL LOW (ref 8.9–10.3)
Chloride: 117 mmol/L — ABNORMAL HIGH (ref 98–111)
Creatinine, Ser: 1.27 mg/dL — ABNORMAL HIGH (ref 0.44–1.00)
GFR calc Af Amer: 43 mL/min — ABNORMAL LOW (ref >60)
GFR calc non Af Amer: 37 mL/min — ABNORMAL LOW (ref >60)
Glucose, Bld: 121 mg/dL — ABNORMAL HIGH (ref 70–99)
Potassium: 4.1 mmol/L (ref 3.5–5.1)
Sodium: 140 mmol/L (ref 135–145)
Total Bilirubin: 0.9 mg/dL (ref 0.3–1.2)
Total Protein: 5.3 g/dL — ABNORMAL LOW (ref 6.5–8.1)

## 2020-05-03 LAB — CBC WITH DIFFERENTIAL/PLATELET
Abs Immature Granulocytes: 0.01 10*3/uL (ref 0.00–0.07)
Basophils Absolute: 0 10*3/uL (ref 0.0–0.1)
Basophils Relative: 0 %
Eosinophils Absolute: 0.2 10*3/uL (ref 0.0–0.5)
Eosinophils Relative: 4 %
HCT: 28.8 % — ABNORMAL LOW (ref 36.0–46.0)
Hemoglobin: 9.3 g/dL — ABNORMAL LOW (ref 12.0–15.0)
Immature Granulocytes: 0 %
Lymphocytes Relative: 31 %
Lymphs Abs: 1.6 10*3/uL (ref 0.7–4.0)
MCH: 32.1 pg (ref 26.0–34.0)
MCHC: 32.3 g/dL (ref 30.0–36.0)
MCV: 99.3 fL (ref 80.0–100.0)
Monocytes Absolute: 0.3 10*3/uL (ref 0.1–1.0)
Monocytes Relative: 6 %
Neutro Abs: 3 10*3/uL (ref 1.7–7.7)
Neutrophils Relative %: 59 %
Platelets: 168 10*3/uL (ref 150–400)
RBC: 2.9 MIL/uL — ABNORMAL LOW (ref 3.87–5.11)
RDW: 17.8 % — ABNORMAL HIGH (ref 11.5–15.5)
WBC: 5.2 10*3/uL (ref 4.0–10.5)
nRBC: 0 % (ref 0.0–0.2)

## 2020-05-03 NOTE — ED Triage Notes (Signed)
Patient arrived with EMS from home reports brief syncopal episode at home this evening while using the bedside commode , CBG= 173 , alert and oriented at arrival , respirations unlabored , denies injury or fall , patted added sinus congestion and occasional productive cough .

## 2020-05-04 DIAGNOSIS — R55 Syncope and collapse: Secondary | ICD-10-CM | POA: Diagnosis not present

## 2020-05-04 LAB — URINALYSIS, ROUTINE W REFLEX MICROSCOPIC
Bilirubin Urine: NEGATIVE
Glucose, UA: NEGATIVE mg/dL
Hgb urine dipstick: NEGATIVE
Ketones, ur: NEGATIVE mg/dL
Leukocytes,Ua: NEGATIVE
Nitrite: NEGATIVE
Protein, ur: NEGATIVE mg/dL
Specific Gravity, Urine: 1.019 (ref 1.005–1.030)
pH: 5 (ref 5.0–8.0)

## 2020-05-04 MED ORDER — SODIUM CHLORIDE 0.9 % IV BOLUS
500.0000 mL | Freq: Once | INTRAVENOUS | Status: AC
  Administered 2020-05-04: 500 mL via INTRAVENOUS

## 2020-05-04 NOTE — ED Provider Notes (Signed)
[facility] EMERGENCY DEPARTMENT Provider Note   CSN: [id number] Arrival date & time: 05/03/20  2006     History Chief Complaint  Patient presents with  . Syncope    Monique Lin is a 86 y.o. female.  Patient presents to the emergency department after near syncopal or possible brief syncopal episode at home.  She was on a bedside commode when she became somewhat unresponsive according to her daughter who is her caregiver.  Patient reports that she could hear her daughter calling her name, was not completely unconscious.  Daughter reports that she had to shake her several times before she became more alert.  Patient had been doing well until that point.  She had not had any heart palpitations, chest pain, shortness of breath, abdominal pain.        Past Medical History:  Diagnosis Date  . Anemia   . Bronchitis   . 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 Hypertension   . Hypothyroidism   . Leg pain   . Stomach tumor (benign)     Patient Active Problem List   Diagnosis Date Noted  . 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 Pressure injury of skin 01/14/2020  . Palliative care by specialist   . Goals of care, counseling/discussion   . Transaminitis   . Acute gallstone pancreatitis 01/09/2020  . Acute renal failure (ARF) ([place]) 01/09/2020  . Essential hypertension 01/09/2020  . Hypothyroidism 01/09/2020  . Acute GI bleeding 12/11/2019  . CAP (community acquired pneumonia)   . Empyema ([place])   . Pleural effusion, left   . Left lower lobe pneumonia 04/01/2018  . Bronchitis     Past Surgical History:  Procedure Laterality Date  . BIOPSY  01/12/2020   Procedure: BIOPSY;  Surgeon: Ronald Lobo, MD;  Location: [place];  Service: Endoscopy;;  . ESOPHAGOGASTRODUODENOSCOPY (EGD) WITH PROPOFOL N/A 01/12/2020   Procedure: ESOPHAGOGASTRODUODENOSCOPY  (EGD) WITH PROPOFOL;  Surgeon: Ronald Lobo, MD;  Location: [place];  Service: Endoscopy;  Laterality: N/A;  . ESOPHAGOGASTRODUODENOSCOPY (EGD) WITH PROPOFOL N/A 01/15/2020   Procedure: ESOPHAGOGASTRODUODENOSCOPY (EGD) WITH PROPOFOL;  Surgeon: Arta Silence, MD;  Location: [place];  Service: Endoscopy;  Laterality: N/A;  . EUS N/A 01/15/2020   Procedure: UPPER ENDOSCOPIC ULTRASOUND (EUS) RADIAL;  Surgeon: Arta Silence, MD;  Location: [place];  Service: Endoscopy;  Laterality: N/A;  . FINE NEEDLE ASPIRATION  01/15/2020   Procedure: FINE NEEDLE ASPIRATION (FNA) LINEAR;  Surgeon: Arta Silence, MD;  Location: [facility] ENDOSCOPY;  Service: Endoscopy;;  . IR CATHETER TUBE CHANGE  04/14/2018  . IR THORACENTESIS ASP PLEURAL SPACE W/IMG GUIDE  04/05/2018  . NASAL SINUS SURGERY    . stomach tumor removal       OB History   No obstetric history on file.     Family History  Family history unknown: Yes    Social History   Tobacco Use  . Smoking status: Never Smoker  . Smokeless tobacco: Never Used  Substance Use Topics  . Alcohol use: No  . Drug use: No    Home Medications Prior to Admission medications   Medication Sig Start Date End Date Taking? Authorizing Provider  acetaminophen (TYLENOL) 650 MG CR tablet Take 650 mg by mouth every 8 (eight) hours as needed for pain.   Yes [provider]  albuterol (PROVENTIL) (2.5 MG/3ML) 0.083% nebulizer solution Take 2.5 mg by nebulization every 6 (  six) hours as needed for wheezing.   Yes [provider]  albuterol (VENTOLIN HFA) 108 (90 Base) MCG/ACT inhaler Inhale 1 puff into the lungs every 6 (six) hours as needed for wheezing or shortness of breath.   Yes [provider]  atorvastatin (LIPITOR) 20 MG tablet Take 20 mg by mouth daily.   Yes [provider]  carvedilol (COREG) 6.25 MG tablet Take 1 tablet (6.25 mg total) by mouth 2 (two) times daily with a meal. 01/30/20  Yes Ennever, Rudell Cobb, MD    Cholecalciferol (VITAMIN D-3) 125 MCG (5000 UT) TABS Take 5,000 Units by mouth daily.    Yes [provider]  Cyanocobalamin (VITAMIN B-12 PO) Take 500 mcg by mouth daily.    Yes [provider]  fexofenadine (ALLEGRA) 60 MG tablet Take 60 mg by mouth daily.   Yes [provider]  fluticasone (FLONASE) 50 MCG/ACT nasal spray Place 2 sprays into both nostrils daily as needed for allergies or rhinitis.    Yes [provider]  folic acid (FOLVITE) 323 MCG tablet Take 800 mcg by mouth daily.    Yes [provider]  furosemide (LASIX) 20 MG tablet Take 1 tablet (20 mg total) by mouth daily. 03/10/20  Yes Ennever, Rudell Cobb, MD  gabapentin (NEURONTIN) 300 MG capsule Take 1 capsule (300 mg total) by mouth 2 (two) times daily. 01/16/20  Yes Mercy Riding, MD  imatinib (GLEEVEC) 400 MG tablet Take 1 tablet (400 mg total) by mouth daily with breakfast. Take with meals and large glass of water.Caution:Chemotherapy 01/19/20  Yes Ennever, Rudell Cobb, MD  levothyroxine (SYNTHROID, LEVOTHROID) 50 MCG tablet Take 50 mcg by mouth daily.   Yes [provider]  ondansetron (ZOFRAN) 8 MG tablet Take 1 tablet (8 mg total) by mouth every 8 (eight) hours as needed for nausea or vomiting. 03/15/20  Yes Ennever, Rudell Cobb, MD  pantoprazole (PROTONIX) 40 MG tablet Take 1 tablet (40 mg total) by mouth 2 (two) times daily. 01/30/20  Yes Ennever, Rudell Cobb, MD  potassium chloride SA (KLOR-CON) 20 MEQ tablet Take 1 tablet (20 mEq total) by mouth daily. 03/10/20  Yes Volanda Napoleon, MD  trolamine salicylate (ASPERCREME) 10 % cream Apply 1 application topically as needed for muscle pain.   Yes [provider]  vitamin E 180 MG (400 UNITS) capsule Take 400 Units by mouth daily.   Yes [provider]  alum & mag hydroxide-simeth (MAALOX/MYLANTA) 200-200-20 MG/5ML suspension Take 30 mLs by mouth every 6 (six) hours as needed for indigestion, heartburn or flatulence. Patient not  taking: Reported on 05/04/2020 12/22/19   [name], MD  bisacodyl (DULCOLAX) 5 MG EC tablet Take 1 tablet (5 mg total) by mouth daily as needed for moderate constipation. Patient not taking: Reported on 05/04/2020 12/22/19   [name], MD    Allergies    Codeine and Penicillins  Review of Systems   Review of Systems  Neurological: Positive for syncope.  All other systems reviewed and are negative.   Physical Exam Updated Vital Signs BP (!) 168/99   Pulse 90   Temp 98.4 F (36.9 C) (Oral)   Resp 18   Ht 5' 4"  (1.626 m)   Wt 84 kg   SpO2 100%   BMI 31.79 kg/m   Physical Exam Vitals and nursing note reviewed.  Constitutional:      General: She is not in acute distress.    Appearance: Normal appearance. She is well-developed.  HENT:     Head: Normocephalic and atraumatic.     Right Ear: Hearing normal.     Left Ear: Hearing normal.     Nose: Nose normal.  Eyes:     Conjunctiva/sclera: Conjunctivae normal.     Pupils: Pupils are equal, round, and reactive to light.  Cardiovascular:     Rate and Rhythm: Regular rhythm.     Heart sounds: S1 normal and S2 normal. No murmur. No friction rub. No gallop.   Pulmonary:     Effort: Pulmonary effort is normal. No respiratory distress.     Breath sounds: Normal breath sounds.  Chest:     Chest wall: No tenderness.  Abdominal:     General: Bowel sounds are normal.     Palpations: Abdomen is soft.     Tenderness: There is no abdominal tenderness. There is no guarding or rebound. Negative signs include Murphy's sign and McBurney's sign.     Hernia: No hernia is present.  Musculoskeletal:        General: Normal range of motion.     Cervical back: Normal range of motion and neck supple.  Skin:    General: Skin is warm and dry.     Findings: No rash.  Neurological:     Mental Status: She is alert and oriented to person, place, and time.     GCS: GCS eye subscore is 4. GCS verbal subscore is 5. GCS motor subscore is 6.      Cranial Nerves: No cranial nerve deficit.     Sensory: No sensory deficit.     Coordination: Coordination normal.  Psychiatric:        Speech: Speech normal.        Behavior: Behavior normal.        Thought Content: Thought content normal.     ED Results / Procedures / Treatments   Labs (all labs ordered are listed, but only abnormal results are displayed) Labs Reviewed  CBC WITH DIFFERENTIAL/PLATELET - Abnormal; Notable for the following components:      Result Value   RBC 2.90 (*)    Hemoglobin 9.3 (*)    HCT 28.8 (*)    RDW 17.8 (*)    All other components within normal limits  COMPREHENSIVE METABOLIC PANEL - Abnormal; Notable for the following components:   Chloride 117 (*)    CO2 19 (*)    Glucose, Bld 121 (*)    Creatinine, Ser 1.27 (*)    Calcium 8.3 (*)    Total Protein 5.3 (*)    Albumin 2.5 (*)    GFR calc non Af Amer 37 (*)    GFR calc Af Amer 43 (*)    Anion gap 4 (*)    All other components within normal limits  URINALYSIS, ROUTINE W REFLEX MICROSCOPIC    EKG None  Radiology DG Chest 2 View  Result Date: 05/03/2020 CLINICAL DATA:  Cough and recent syncopal episode EXAM: CHEST - 2 VIEW COMPARISON:  12/18/2019 FINDINGS: Cardiac shadow is stable. Aortic calcifications are again noted. The lungs are well aerated bilaterally. Chronic blunting of left costophrenic angle is noted but improved from the prior study. No focal infiltrate is seen. No bony abnormality is noted. IMPRESSION: No acute abnormality seen. Electronically Signed   By: Inez Catalina M.D.   On: 05/03/2020 21:01    Procedures Procedures (including critical care time)  Medications Ordered in ED Medications  sodium chloride 0.9 % bolus 500 mL (500 mLs Intravenous  New Bag/Given 05/04/20 0358)    ED Course  I have reviewed the triage vital signs and the nursing notes.  Pertinent labs & imaging results that were available during my care of the patient were reviewed by me and considered in my  medical decision making (see chart for details).    MDM Rules/Calculators/A&P                      Patient is alert and oriented upon arrival to the ER.  She is without complaints.  It sounds as though this was a near syncopal episode, patient never completely lost consciousness.  Symptoms only lasted seconds.  She has had syncopal episodes previously.  Reviewing her records reveals a hospitalization in January where it was felt that her episodes were due to vasovagal syncope exacerbated by beta-blocker.  The beta-blocker was stopped at that time.  Patient has not had any bradycardia here in the ER.  Lab work was unremarkable.  She has no focal neurologic deficits, I do not feel CT would be helpful.  She was orthostatic, will be given IV fluids.  As she has back to her baseline, I feel it is appropriate for her to be discharged to follow-up with her cardiologist, Dr. [name] for further evaluation.  Final Clinical Impression(s) / ED Diagnoses Final diagnoses:  Vasovagal syncope    Rx / DC Orders ED Discharge Orders    None       Dalante Minus, Gwenyth Allegra, MD 05/04/20 0401

## 2020-05-04 NOTE — ED Notes (Signed)
Patient verbalizes understanding of discharge instructions. Opportunity for questioning and answers were provided. Armband removed by staff, pt discharged from ED stable & ambulatory  

## 2020-05-04 NOTE — ED Notes (Signed)
ED Provider at bedside. 

## 2020-05-18 ENCOUNTER — Telehealth: Payer: Self-pay | Admitting: *Deleted

## 2020-05-18 NOTE — Telephone Encounter (Signed)
Call received from patient's daughter,  [name] to notify Dr. Marin Olp that pt has a dry cough and would like to know what patient can take for her cough.  Dr. Marin Olp notified and would like for pt.'s daughter to contact her PCP regarding cough.  Call placed back to patient's daughter and notified her of above.  [name] appreciative of call and has no questions at this time.

## 2020-05-21 ENCOUNTER — Inpatient Hospital Stay: Payer: Medicare Other | Attending: Hematology & Oncology

## 2020-05-21 ENCOUNTER — Other Ambulatory Visit: Payer: Self-pay

## 2020-05-21 ENCOUNTER — Inpatient Hospital Stay (HOSPITAL_BASED_OUTPATIENT_CLINIC_OR_DEPARTMENT_OTHER): Payer: Medicare Other | Admitting: Hematology & Oncology

## 2020-05-21 ENCOUNTER — Encounter (INDEPENDENT_AMBULATORY_CARE_PROVIDER_SITE_OTHER): Payer: Self-pay

## 2020-05-21 ENCOUNTER — Encounter: Payer: Self-pay | Admitting: Hematology & Oncology

## 2020-05-21 VITALS — BP 157/59 | HR 77 | Temp 96.8°F | Resp 18 | Wt 175.0 lb

## 2020-05-21 DIAGNOSIS — Z885 Allergy status to narcotic agent status: Secondary | ICD-10-CM | POA: Insufficient documentation

## 2020-05-21 DIAGNOSIS — R109 Unspecified abdominal pain: Secondary | ICD-10-CM | POA: Insufficient documentation

## 2020-05-21 DIAGNOSIS — C49A0 Gastrointestinal stromal tumor, unspecified site: Secondary | ICD-10-CM

## 2020-05-21 DIAGNOSIS — R05 Cough: Secondary | ICD-10-CM | POA: Diagnosis not present

## 2020-05-21 DIAGNOSIS — C49A2 Gastrointestinal stromal tumor of stomach: Secondary | ICD-10-CM | POA: Diagnosis present

## 2020-05-21 DIAGNOSIS — R110 Nausea: Secondary | ICD-10-CM | POA: Diagnosis not present

## 2020-05-21 DIAGNOSIS — Z880 Allergy status to penicillin: Secondary | ICD-10-CM | POA: Diagnosis not present

## 2020-05-21 DIAGNOSIS — Z79899 Other long term (current) drug therapy: Secondary | ICD-10-CM | POA: Diagnosis not present

## 2020-05-21 DIAGNOSIS — K922 Gastrointestinal hemorrhage, unspecified: Secondary | ICD-10-CM | POA: Diagnosis not present

## 2020-05-21 DIAGNOSIS — R062 Wheezing: Secondary | ICD-10-CM | POA: Diagnosis not present

## 2020-05-21 LAB — RETICULOCYTES
Immature Retic Fract: 12 % (ref 2.3–15.9)
RBC.: 2.81 MIL/uL — ABNORMAL LOW (ref 3.87–5.11)
Retic Count, Absolute: 51.4 10*3/uL (ref 19.0–186.0)
Retic Ct Pct: 1.8 % (ref 0.4–3.1)

## 2020-05-21 LAB — CMP (CANCER CENTER ONLY)
ALT: 12 U/L (ref 0–44)
AST: 19 U/L (ref 15–41)
Albumin: 3.1 g/dL — ABNORMAL LOW (ref 3.5–5.0)
Alkaline Phosphatase: 56 U/L (ref 38–126)
Anion gap: 6 (ref 5–15)
BUN: 15 mg/dL (ref 8–23)
CO2: 23 mmol/L (ref 22–32)
Calcium: 8.9 mg/dL (ref 8.9–10.3)
Chloride: 114 mmol/L — ABNORMAL HIGH (ref 98–111)
Creatinine: 1.18 mg/dL — ABNORMAL HIGH (ref 0.44–1.00)
GFR, Est AFR Am: 47 mL/min — ABNORMAL LOW (ref >60)
GFR, Estimated: 41 mL/min — ABNORMAL LOW (ref >60)
Glucose, Bld: 91 mg/dL (ref 70–99)
Potassium: 4.6 mmol/L (ref 3.5–5.1)
Sodium: 143 mmol/L (ref 135–145)
Total Bilirubin: 0.6 mg/dL (ref 0.3–1.2)
Total Protein: 5.9 g/dL — ABNORMAL LOW (ref 6.5–8.1)

## 2020-05-21 LAB — CBC WITH DIFFERENTIAL (CANCER CENTER ONLY)
Abs Immature Granulocytes: 0.02 10*3/uL (ref 0.00–0.07)
Basophils Absolute: 0 10*3/uL (ref 0.0–0.1)
Basophils Relative: 0 %
Eosinophils Absolute: 0.2 10*3/uL (ref 0.0–0.5)
Eosinophils Relative: 3 %
HCT: 27.9 % — ABNORMAL LOW (ref 36.0–46.0)
Hemoglobin: 9 g/dL — ABNORMAL LOW (ref 12.0–15.0)
Immature Granulocytes: 0 %
Lymphocytes Relative: 42 %
Lymphs Abs: 2.1 10*3/uL (ref 0.7–4.0)
MCH: 32.5 pg (ref 26.0–34.0)
MCHC: 32.3 g/dL (ref 30.0–36.0)
MCV: 100.7 fL — ABNORMAL HIGH (ref 80.0–100.0)
Monocytes Absolute: 0.5 10*3/uL (ref 0.1–1.0)
Monocytes Relative: 10 %
Neutro Abs: 2.3 10*3/uL (ref 1.7–7.7)
Neutrophils Relative %: 45 %
Platelet Count: 177 10*3/uL (ref 150–400)
RBC: 2.77 MIL/uL — ABNORMAL LOW (ref 3.87–5.11)
RDW: 17.4 % — ABNORMAL HIGH (ref 11.5–15.5)
WBC Count: 5.1 10*3/uL (ref 4.0–10.5)
nRBC: 0 % (ref 0.0–0.2)

## 2020-05-21 NOTE — Progress Notes (Signed)
Hematology and Oncology Follow Up Visit  Monique Lin [id number] 07-09-1927 86 y.o. 05/21/2020   Principle Diagnosis:   Recurrent GIST of the stomach  Current Therapy:    Gleevec 400 mg p.o.q other day-to start on 01/31/2020     Interim History:  Monique Lin is in for follow-up.  The great news is that we did a CT scan on her on May 20.  Thankfully, she has had a very good response to treatment.  Her tumor probably has shrunk by about 60%.  It had been 11 x 11 cm.  Now, it is 7.8 x 5.7 cm.  She is on Gleevec every other day.  This is the frequency that she can tolerate.  We give her Gleevec daily, she will get nauseous.  She has had no other issues.  She does have a larger left breast and right breast what she says.  This is not new.  She has had a little bit of a dry cough.  There is no issues with fever.  She has had no obvious bleeding.  There is been no melena or bright red blood per rectum.  Currently, I would say her performance status is probably ECOG 2 at best.  Medications:  Current Outpatient Medications:  .  acetaminophen (TYLENOL) 650 MG CR tablet, Take 650 mg by mouth every 8 (eight) hours as needed for pain., Disp: , Rfl:  .  albuterol (PROVENTIL) (2.5 MG/3ML) 0.083% nebulizer solution, Take 2.5 mg by nebulization every 6 (six) hours as needed for wheezing., Disp: , Rfl:  .  albuterol (VENTOLIN HFA) 108 (90 Base) MCG/ACT inhaler, Inhale 1 puff into the lungs every 6 (six) hours as needed for wheezing or shortness of breath., Disp: , Rfl:  .  alum & mag hydroxide-simeth (MAALOX/MYLANTA) 283-151-76 MG/5ML suspension, Take 30 mLs by mouth every 6 (six) hours as needed for indigestion, heartburn or flatulence. (Patient not taking: Reported on 05/04/2020), Disp: 355 mL, Rfl: 0 .  atorvastatin (LIPITOR) 20 MG tablet, Take 20 mg by mouth daily., Disp: , Rfl:  .  bisacodyl (DULCOLAX) 5 MG EC tablet, Take 1 tablet (5 mg total) by mouth daily as needed for moderate constipation.  (Patient not taking: Reported on 05/04/2020), Disp: 30 tablet, Rfl: 0 .  carvedilol (COREG) 6.25 MG tablet, Take 1 tablet (6.25 mg total) by mouth 2 (two) times daily with a meal., Disp: 14 tablet, Rfl: 0 .  Cholecalciferol (VITAMIN D-3) 125 MCG (5000 UT) TABS, Take 5,000 Units by mouth daily. , Disp: , Rfl:  .  Cyanocobalamin (VITAMIN B-12 PO), Take 500 mcg by mouth daily. , Disp: , Rfl:  .  fexofenadine (ALLEGRA) 60 MG tablet, Take 60 mg by mouth daily., Disp: , Rfl:  .  fluticasone (FLONASE) 50 MCG/ACT nasal spray, Place 2 sprays into both nostrils daily as needed for allergies or rhinitis. , Disp: , Rfl:  .  folic acid (FOLVITE) 160 MCG tablet, Take 800 mcg by mouth daily. , Disp: , Rfl:  .  furosemide (LASIX) 20 MG tablet, Take 1 tablet (20 mg total) by mouth daily., Disp: 30 tablet, Rfl: 6 .  gabapentin (NEURONTIN) 300 MG capsule, Take 1 capsule (300 mg total) by mouth 2 (two) times daily., Disp: 180 capsule, Rfl: 1 .  imatinib (GLEEVEC) 400 MG tablet, Take 1 tablet (400 mg total) by mouth daily with breakfast. Take with meals and large glass of water.Caution:Chemotherapy, Disp: 30 tablet, Rfl: 5 .  levothyroxine (SYNTHROID, LEVOTHROID) 50 MCG tablet, Take  50 mcg by mouth daily., Disp: , Rfl:  .  ondansetron (ZOFRAN) 8 MG tablet, Take 1 tablet (8 mg total) by mouth every 8 (eight) hours as needed for nausea or vomiting., Disp: 20 tablet, Rfl: 0 .  pantoprazole (PROTONIX) 40 MG tablet, Take 1 tablet (40 mg total) by mouth 2 (two) times daily., Disp: 20 tablet, Rfl: 0 .  potassium chloride SA (KLOR-CON) 20 MEQ tablet, Take 1 tablet (20 mEq total) by mouth daily., Disp: 30 tablet, Rfl: 5 .  trolamine salicylate (ASPERCREME) 10 % cream, Apply 1 application topically as needed for muscle pain., Disp: , Rfl:  .  vitamin E 180 MG (400 UNITS) capsule, Take 400 Units by mouth daily., Disp: , Rfl:   Allergies:  Allergies  Allergen Reactions  . Codeine Other (See Comments)    Stomach cramps  .  Penicillins Other (See Comments)    "Bad stomach cramps" Has patient had a PCN reaction causing immediate rash, facial/tongue/throat swelling, SOB or lightheadedness with hypotension: Unk Has patient had a PCN reaction causing severe rash involving mucus membranes or skin necrosis: Unk Has patient had a PCN reaction that required hospitalization: Unk Has patient had a PCN reaction occurring within the last 10 years: No If all of the above answers are "NO", then may proceed with Cephalosporin use.      Past Medical History, Surgical history, Social history, and Family History were reviewed and updated.  Review of Systems: Review of Systems  Constitutional: Negative.   HENT:  Negative.   Eyes: Negative.   Respiratory: Positive for wheezing.   Cardiovascular: Negative.   Gastrointestinal: Positive for abdominal pain.  Endocrine: Negative.   Genitourinary: Negative.    Musculoskeletal: Negative.   Skin: Negative.   Neurological: Negative.   Hematological: Negative.   Psychiatric/Behavioral: Negative.     Physical Exam:  weight is 175 lb (79.4 kg). Her temporal temperature is 96.8 F (36 C) (abnormal). Her blood pressure is 157/59 (abnormal) and her pulse is 77. Her respiration is 18 and oxygen saturation is 100%.   Wt Readings from Last 3 Encounters:  05/21/20 175 lb (79.4 kg)  05/03/20 185 lb 3 oz (84 kg)  04/15/20 177 lb (80.3 kg)    Physical Exam Vitals reviewed.  HENT:     Head: Normocephalic and atraumatic.  Eyes:     Pupils: Pupils are equal, round, and reactive to light.  Cardiovascular:     Rate and Rhythm: Normal rate and regular rhythm.     Heart sounds: Normal heart sounds.  Pulmonary:     Effort: Pulmonary effort is normal.     Breath sounds: Normal breath sounds.  Abdominal:     General: Bowel sounds are normal.     Palpations: Abdomen is soft.  Musculoskeletal:        General: No tenderness or deformity. Normal range of motion.     Cervical back:  Normal range of motion.  Lymphadenopathy:     Cervical: No cervical adenopathy.  Skin:    General: Skin is warm and dry.     Findings: No erythema or rash.  Neurological:     Mental Status: She is alert and oriented to person, place, and time.  Psychiatric:        Behavior: Behavior normal.        Thought Content: Thought content normal.        Judgment: Judgment normal.      Lab Results  Component Value Date   WBC 5.1  05/21/2020   HGB 9.0 (L) 05/21/2020   HCT 27.9 (L) 05/21/2020   MCV 100.7 (H) 05/21/2020   PLT 177 05/21/2020     Chemistry      Component Value Date/Time   NA 143 05/21/2020 1357   K 4.6 05/21/2020 1357   CL 114 (H) 05/21/2020 1357   CO2 23 05/21/2020 1357   BUN 15 05/21/2020 1357   CREATININE 1.18 (H) 05/21/2020 1357      Component Value Date/Time   CALCIUM 8.9 05/21/2020 1357   ALKPHOS 56 05/21/2020 1357   AST 19 05/21/2020 1357   ALT 12 05/21/2020 1357   BILITOT 0.6 05/21/2020 1357      Impression and Plan: Ms. Bray is a 86 year old African-American female.  She has a recurrent gastrointestinal stromal tumor of the stomach.  This is not operable.  The CT scan confirms that [place] is working so well.  She hopefully will continue to have nice tumor shrinkage.  I do not think we have to do another scan except for 3 months.  I would like to see her back in 6 weeks.  It is always fun talking to her and her daughter.  They are a good pair.  They certainly feed off each other.    Volanda Napoleon, MD 6/18/20212:55 PM

## 2020-05-24 ENCOUNTER — Telehealth: Payer: Self-pay | Admitting: *Deleted

## 2020-05-24 LAB — IRON AND TIBC
Iron: 80 ug/dL (ref 41–142)
Saturation Ratios: 63 % — ABNORMAL HIGH (ref 21–57)
TIBC: 128 ug/dL — ABNORMAL LOW (ref 236–444)
UIBC: 48 ug/dL — ABNORMAL LOW (ref 120–384)

## 2020-05-24 LAB — FERRITIN: Ferritin: 240 ng/mL (ref 11–307)

## 2020-05-24 LAB — LACTATE DEHYDROGENASE: LDH: 242 U/L — ABNORMAL HIGH (ref 98–192)

## 2020-05-24 NOTE — Telephone Encounter (Signed)
As noted below by Dr. Marin Olp,  Informed the daughter that the iron level is OK. She verbalized understanding.

## 2020-05-24 NOTE — Telephone Encounter (Signed)
-----   Message from Volanda Napoleon, MD sent at 05/24/2020  1:42 PM EDT ----- Call - the iron level is ok!!  Laurey Arrow

## 2020-06-02 ENCOUNTER — Telehealth: Payer: Self-pay | Admitting: Hematology & Oncology

## 2020-06-02 NOTE — Telephone Encounter (Signed)
Provider schedule change updated calendar was mailed  

## 2020-06-09 ENCOUNTER — Other Ambulatory Visit: Payer: Self-pay | Admitting: *Deleted

## 2020-06-09 DIAGNOSIS — C49A2 Gastrointestinal stromal tumor of stomach: Secondary | ICD-10-CM

## 2020-06-09 MED ORDER — IMATINIB MESYLATE 400 MG PO TABS: 400.0000 mg | ORAL_TABLET | Freq: Every day | ORAL | 5 refills | Status: DC

## 2020-06-11 ENCOUNTER — Other Ambulatory Visit: Payer: Self-pay | Admitting: *Deleted

## 2020-06-11 DIAGNOSIS — C49A2 Gastrointestinal stromal tumor of stomach: Secondary | ICD-10-CM

## 2020-06-11 MED ORDER — IMATINIB MESYLATE 400 MG PO TABS: 400.0000 mg | ORAL_TABLET | Freq: Every day | ORAL | 0 refills | Status: DC

## 2020-06-18 ENCOUNTER — Telehealth: Payer: Self-pay | Admitting: *Deleted

## 2020-06-18 NOTE — Telephone Encounter (Signed)
Call received from patient's daughter [name] stating that pt has started with swelling to her face, left hand up to elbow and to bilateral feet.  [name] states that pt.'s swelling to feet has slightly improved with elevation of feet.  Jory Ee NP notified.  Call placed back to [facility] and instructed [name] to hold [place] for now and that we would speak with Dr. Marin Olp on Monday, upon his return to the office and call her with further instructions per order of S. [place] NP.  [name] is appreciative of call back and has no further questions or concerns at this time.

## 2020-06-21 ENCOUNTER — Other Ambulatory Visit: Payer: Self-pay | Admitting: *Deleted

## 2020-06-21 ENCOUNTER — Telehealth: Payer: Self-pay | Admitting: *Deleted

## 2020-06-21 MED ORDER — POTASSIUM CHLORIDE CRYS ER 20 MEQ PO TBCR: 20.0000 meq | EXTENDED_RELEASE_TABLET | Freq: Every day | ORAL | 1 refills | Status: DC

## 2020-06-21 NOTE — Telephone Encounter (Signed)
Call placed to patient's daughter, [name] to inform her that Dr. Marin Olp would like for pt to continue to hold [place] this week and to call office back on Friday to give update on pt.'s status.  [name] states that pt.'s facial swelling and arm swelling has decreased slightly and that swelling to bilateral legs does decrease when legs are elevated.  [name] is appreciative of call and has no other questions or concerns at this time.

## 2020-06-25 ENCOUNTER — Telehealth: Payer: Self-pay | Admitting: *Deleted

## 2020-06-25 NOTE — Telephone Encounter (Signed)
Call placed to patient's daughter [name] to check on patient's status and edema.  [name] states that patients facial edema is gone, left breast edema remains the same and that swelling to lower legs has decreased and gets better once pt has elevated legs.  [name] states that pt has continued to hold [place] as ordered.  Dr. Marin Olp notified.  Call placed back to [facility] and [facility] notified per order of Dr. Marin Olp to continue to hold [place] and that Dr. Marin Olp would like to see patient in three weeks. New appt time given to [facility].  [name] is appreciative of call and has no other questions or concerns at this time.

## 2020-07-14 ENCOUNTER — Inpatient Hospital Stay: Payer: Medicare Other | Attending: Hematology & Oncology

## 2020-07-14 ENCOUNTER — Inpatient Hospital Stay (HOSPITAL_BASED_OUTPATIENT_CLINIC_OR_DEPARTMENT_OTHER): Payer: Medicare Other | Admitting: Hematology & Oncology

## 2020-07-14 ENCOUNTER — Encounter: Payer: Self-pay | Admitting: Hematology & Oncology

## 2020-07-14 ENCOUNTER — Encounter (INDEPENDENT_AMBULATORY_CARE_PROVIDER_SITE_OTHER): Payer: Self-pay

## 2020-07-14 ENCOUNTER — Other Ambulatory Visit: Payer: Self-pay

## 2020-07-14 VITALS — BP 158/87 | HR 90 | Temp 98.6°F | Resp 20 | Wt 170.1 lb

## 2020-07-14 DIAGNOSIS — C49A0 Gastrointestinal stromal tumor, unspecified site: Secondary | ICD-10-CM | POA: Diagnosis not present

## 2020-07-14 DIAGNOSIS — M7989 Other specified soft tissue disorders: Secondary | ICD-10-CM | POA: Insufficient documentation

## 2020-07-14 DIAGNOSIS — M25561 Pain in right knee: Secondary | ICD-10-CM | POA: Insufficient documentation

## 2020-07-14 DIAGNOSIS — K922 Gastrointestinal hemorrhage, unspecified: Secondary | ICD-10-CM

## 2020-07-14 DIAGNOSIS — D649 Anemia, unspecified: Secondary | ICD-10-CM | POA: Diagnosis not present

## 2020-07-14 DIAGNOSIS — C49A2 Gastrointestinal stromal tumor of stomach: Secondary | ICD-10-CM | POA: Diagnosis present

## 2020-07-14 DIAGNOSIS — Z79899 Other long term (current) drug therapy: Secondary | ICD-10-CM | POA: Diagnosis not present

## 2020-07-14 LAB — LIPID PANEL
Cholesterol: 119 mg/dL (ref 0–200)
HDL: 48 mg/dL (ref >40)
LDL Cholesterol: 60 mg/dL (ref 0–99)
Total CHOL/HDL Ratio: 2.5 RATIO
Triglycerides: 55 mg/dL (ref <150)
VLDL: 11 mg/dL (ref 0–40)

## 2020-07-14 LAB — CMP (CANCER CENTER ONLY)
ALT: 12 U/L (ref 0–44)
AST: 14 U/L — ABNORMAL LOW (ref 15–41)
Albumin: 3.1 g/dL — ABNORMAL LOW (ref 3.5–5.0)
Alkaline Phosphatase: 80 U/L (ref 38–126)
Anion gap: 6 (ref 5–15)
BUN: 28 mg/dL — ABNORMAL HIGH (ref 8–23)
CO2: 27 mmol/L (ref 22–32)
Calcium: 9.3 mg/dL (ref 8.9–10.3)
Chloride: 110 mmol/L (ref 98–111)
Creatinine: 1.3 mg/dL — ABNORMAL HIGH (ref 0.44–1.00)
GFR, Est AFR Am: 42 mL/min — ABNORMAL LOW (ref >60)
GFR, Estimated: 36 mL/min — ABNORMAL LOW (ref >60)
Glucose, Bld: 81 mg/dL (ref 70–99)
Potassium: 4.8 mmol/L (ref 3.5–5.1)
Sodium: 143 mmol/L (ref 135–145)
Total Bilirubin: 0.7 mg/dL (ref 0.3–1.2)
Total Protein: 5.9 g/dL — ABNORMAL LOW (ref 6.5–8.1)

## 2020-07-14 LAB — CBC WITH DIFFERENTIAL (CANCER CENTER ONLY)
Abs Immature Granulocytes: 0.06 10*3/uL (ref 0.00–0.07)
Basophils Absolute: 0 10*3/uL (ref 0.0–0.1)
Basophils Relative: 1 %
Eosinophils Absolute: 0.1 10*3/uL (ref 0.0–0.5)
Eosinophils Relative: 2 %
HCT: 30.2 % — ABNORMAL LOW (ref 36.0–46.0)
Hemoglobin: 9.8 g/dL — ABNORMAL LOW (ref 12.0–15.0)
Immature Granulocytes: 1 %
Lymphocytes Relative: 30 %
Lymphs Abs: 2.1 10*3/uL (ref 0.7–4.0)
MCH: 32.1 pg (ref 26.0–34.0)
MCHC: 32.5 g/dL (ref 30.0–36.0)
MCV: 99 fL (ref 80.0–100.0)
Monocytes Absolute: 0.8 10*3/uL (ref 0.1–1.0)
Monocytes Relative: 11 %
Neutro Abs: 4.1 10*3/uL (ref 1.7–7.7)
Neutrophils Relative %: 55 %
Platelet Count: 204 10*3/uL (ref 150–400)
RBC: 3.05 MIL/uL — ABNORMAL LOW (ref 3.87–5.11)
RDW: 16.9 % — ABNORMAL HIGH (ref 11.5–15.5)
WBC Count: 7.2 10*3/uL (ref 4.0–10.5)
nRBC: 0 % (ref 0.0–0.2)

## 2020-07-14 NOTE — Progress Notes (Signed)
Hematology and Oncology Follow Up Visit  Monique Lin [id number] 1927/02/13 86 y.o. 07/14/2020   Principle Diagnosis:   Recurrent GIST of the stomach  Current Therapy:    Gleevec 400 mg p.o.q other day-to start on 01/31/2020 --decrease to 200 mg p.o. q. every other day starting 07/13/2020      Interim History:  Monique Lin is in for follow-up.  Overall, she is about the same.  The main problem has been this swelling in her legs.  This is probably multifactorial.  She has been off [place] since we really last saw her.  As such, I doubt the Gleevec is the reason for the leg swelling.  I know she has anemia.  Know she has low albumin.  I suspect these are probably the most likely reason.  She is having a lot of pain in the right knee.  Hopefully, tramadol will be able to help this.  She has had a good appetite.  She has had no abdominal pain.  She has had no obvious change in bowel or bladder habits.  She has had no cough.  She really needs to get back onto Gleevec.  She has had a very nice response to [place] by her last CT scan.  I think that we can try her on 200 mg p.o. every other day.  I think this might be reasonable.  I do not think that she really needs full dose.  At her age, she just might be able to respond to decreased dose.  She has had no issues with bleeding.  There is no melena.  Her last iron studies back in June showed a ferritin of 240 with an iron saturation of 63%.  Currently, I would say her performance status is probably ECOG 2 at best.  Medications:  Current Outpatient Medications:  .  acetaminophen (TYLENOL) 650 MG CR tablet, Take 650 mg by mouth every 8 (eight) hours as needed for pain., Disp: , Rfl:  .  albuterol (PROVENTIL) (2.5 MG/3ML) 0.083% nebulizer solution, Take 2.5 mg by nebulization every 6 (six) hours as needed for wheezing., Disp: , Rfl:  .  albuterol (VENTOLIN HFA) 108 (90 Base) MCG/ACT inhaler, Inhale 1 puff into the lungs every 6 (six) hours as  needed for wheezing or shortness of breath., Disp: , Rfl:  .  atorvastatin (LIPITOR) 20 MG tablet, Take 20 mg by mouth daily., Disp: , Rfl:  .  carvedilol (COREG) 6.25 MG tablet, Take 1 tablet (6.25 mg total) by mouth 2 (two) times daily with a meal., Disp: 14 tablet, Rfl: 0 .  Cholecalciferol (VITAMIN D-3) 125 MCG (5000 UT) TABS, Take 5,000 Units by mouth daily. , Disp: , Rfl:  .  Cyanocobalamin (VITAMIN B-12 PO), Take 500 mcg by mouth daily. , Disp: , Rfl:  .  fexofenadine (ALLEGRA) 60 MG tablet, Take 60 mg by mouth daily., Disp: , Rfl:  .  fluticasone (FLONASE) 50 MCG/ACT nasal spray, Place 2 sprays into both nostrils daily as needed for allergies or rhinitis. , Disp: , Rfl:  .  folic acid (FOLVITE) 245 MCG tablet, Take 800 mcg by mouth daily. , Disp: , Rfl:  .  furosemide (LASIX) 20 MG tablet, Take 1 tablet (20 mg total) by mouth daily., Disp: 30 tablet, Rfl: 6 .  gabapentin (NEURONTIN) 300 MG capsule, Take 1 capsule (300 mg total) by mouth 2 (two) times daily., Disp: 180 capsule, Rfl: 1 .  imatinib (GLEEVEC) 400 MG tablet, Take 1 tablet (400 mg total)  by mouth daily with breakfast. Take with meals and large glass of water.Caution:Chemotherapy, Disp: 90 tablet, Rfl: 0 .  levothyroxine (SYNTHROID, LEVOTHROID) 50 MCG tablet, Take 50 mcg by mouth daily., Disp: , Rfl:  .  ondansetron (ZOFRAN) 8 MG tablet, Take 1 tablet (8 mg total) by mouth every 8 (eight) hours as needed for nausea or vomiting., Disp: 20 tablet, Rfl: 0 .  pantoprazole (PROTONIX) 40 MG tablet, Take 1 tablet (40 mg total) by mouth 2 (two) times daily., Disp: 20 tablet, Rfl: 0 .  potassium chloride SA (KLOR-CON) 20 MEQ tablet, Take 1 tablet (20 mEq total) by mouth daily., Disp: 90 tablet, Rfl: 1 .  trolamine salicylate (ASPERCREME) 10 % cream, Apply 1 application topically as needed for muscle pain., Disp: , Rfl:  .  vitamin E 180 MG (400 UNITS) capsule, Take 400 Units by mouth daily., Disp: , Rfl:  .  alum & mag hydroxide-simeth  (MAALOX/MYLANTA) 200-200-20 MG/5ML suspension, Take 30 mLs by mouth every 6 (six) hours as needed for indigestion, heartburn or flatulence. (Patient not taking: Reported on 05/04/2020), Disp: 355 mL, Rfl: 0 .  bisacodyl (DULCOLAX) 5 MG EC tablet, Take 1 tablet (5 mg total) by mouth daily as needed for moderate constipation. (Patient not taking: Reported on 05/04/2020), Disp: 30 tablet, Rfl: 0  Allergies:  Allergies  Allergen Reactions  . Codeine Other (See Comments)    Stomach cramps  . Penicillins Other (See Comments)    "Bad stomach cramps" Has patient had a PCN reaction causing immediate rash, facial/tongue/throat swelling, SOB or lightheadedness with hypotension: Unk Has patient had a PCN reaction causing severe rash involving mucus membranes or skin necrosis: Unk Has patient had a PCN reaction that required hospitalization: Unk Has patient had a PCN reaction occurring within the last 10 years: No If all of the above answers are "NO", then may proceed with Cephalosporin use.      Past Medical History, Surgical history, Social history, and Family History were reviewed and updated.  Review of Systems: Review of Systems  Constitutional: Negative.   HENT:  Negative.   Eyes: Negative.   Respiratory: Positive for wheezing.   Cardiovascular: Negative.   Gastrointestinal: Positive for abdominal pain.  Endocrine: Negative.   Genitourinary: Negative.    Musculoskeletal: Negative.   Skin: Negative.   Neurological: Negative.   Hematological: Negative.   Psychiatric/Behavioral: Negative.     Physical Exam:  weight is 170 lb 1.9 oz (77.2 kg). Her oral temperature is 98.6 F (37 C). Her blood pressure is 158/87 (abnormal) and her pulse is 90. Her respiration is 20 and oxygen saturation is 100%.   Wt Readings from Last 3 Encounters:  07/14/20 170 lb 1.9 oz (77.2 kg)  05/21/20 175 lb (79.4 kg)  05/03/20 185 lb 3 oz (84 kg)    Physical Exam Vitals reviewed.  HENT:     Head:  Normocephalic and atraumatic.  Eyes:     Pupils: Pupils are equal, round, and reactive to light.  Cardiovascular:     Rate and Rhythm: Normal rate and regular rhythm.     Heart sounds: Normal heart sounds.  Pulmonary:     Effort: Pulmonary effort is normal.     Breath sounds: Normal breath sounds.  Abdominal:     General: Bowel sounds are normal.     Palpations: Abdomen is soft.  Musculoskeletal:        General: No tenderness or deformity. Normal range of motion.     Cervical back: Normal  range of motion.  Lymphadenopathy:     Cervical: No cervical adenopathy.  Skin:    General: Skin is warm and dry.     Findings: No erythema or rash.  Neurological:     Mental Status: She is alert and oriented to person, place, and time.  Psychiatric:        Behavior: Behavior normal.        Thought Content: Thought content normal.        Judgment: Judgment normal.      Lab Results  Component Value Date   WBC 7.2 07/14/2020   HGB 9.8 (L) 07/14/2020   HCT 30.2 (L) 07/14/2020   MCV 99.0 07/14/2020   PLT 204 07/14/2020     Chemistry      Component Value Date/Time   NA 143 07/14/2020 1518   K 4.8 07/14/2020 1518   CL 110 07/14/2020 1518   CO2 27 07/14/2020 1518   BUN 28 (H) 07/14/2020 1518   CREATININE 1.30 (H) 07/14/2020 1518      Component Value Date/Time   CALCIUM 9.3 07/14/2020 1518   ALKPHOS 80 07/14/2020 1518   AST 14 (L) 07/14/2020 1518   ALT 12 07/14/2020 1518   BILITOT 0.7 07/14/2020 1518      Impression and Plan: Ms. Ravenscroft is a 86 year old African-American female.  She has a recurrent gastrointestinal stromal tumor of the stomach.  This is not operable.  The CT scan she had done back in May confirms that [place] is working nicely.  As such, I really think we have to get her back on Gleevec.  We will not do another CT scan on her until September.  Again, we are dealing with her quality of life as the main goal.  She is so far, she really has done well with her  quality of life.  I does want to her to continue to have a decent quality of life.  Unfortunately I am unsure how much more can be done for her leg swelling.  I will plan to do a CT scan the same day that we see her when she comes back.  We will plan to get her back in about 6 weeks.    Volanda Napoleon, MD 8/11/20213:57 PM

## 2020-07-15 ENCOUNTER — Telehealth: Payer: Self-pay | Admitting: Hematology & Oncology

## 2020-07-15 LAB — IRON AND TIBC
Iron: 60 ug/dL (ref 41–142)
Saturation Ratios: 49 % (ref 21–57)
TIBC: 123 ug/dL — ABNORMAL LOW (ref 236–444)
UIBC: 62 ug/dL — ABNORMAL LOW (ref 120–384)

## 2020-07-15 LAB — FERRITIN: Ferritin: 188 ng/mL (ref 11–307)

## 2020-07-15 NOTE — Telephone Encounter (Signed)
Called and advised of appointments added per 8/11 los

## 2020-07-16 ENCOUNTER — Ambulatory Visit: Payer: Medicare Other | Admitting: Hematology & Oncology

## 2020-07-16 ENCOUNTER — Other Ambulatory Visit: Payer: Medicare Other

## 2020-07-22 ENCOUNTER — Telehealth: Payer: Self-pay | Admitting: Hematology & Oncology

## 2020-07-22 ENCOUNTER — Other Ambulatory Visit: Payer: Self-pay | Admitting: *Deleted

## 2020-07-22 DIAGNOSIS — C49A2 Gastrointestinal stromal tumor of stomach: Secondary | ICD-10-CM

## 2020-07-22 MED ORDER — IMATINIB MESYLATE 400 MG PO TABS: 200.0000 mg | ORAL_TABLET | ORAL | 3 refills | Status: DC

## 2020-07-22 NOTE — Telephone Encounter (Signed)
Called and spoke with patient's other daughter regarding appointments being moved back to 9/10 as originally scheduled.  She was in agreement with this as well.  CT Scan scheduled for same date

## 2020-07-23 ENCOUNTER — Ambulatory Visit: Payer: Medicare Other | Admitting: Hematology & Oncology

## 2020-07-23 ENCOUNTER — Other Ambulatory Visit: Payer: Medicare Other

## 2020-08-13 ENCOUNTER — Ambulatory Visit (HOSPITAL_BASED_OUTPATIENT_CLINIC_OR_DEPARTMENT_OTHER)
Admission: RE | Admit: 2020-08-13 | Discharge: 2020-08-13 | Disposition: A | Discharge status: Home / Self Care | Payer: Medicare Other | Source: Ambulatory Visit | Attending: Hematology & Oncology | Admitting: Hematology & Oncology

## 2020-08-13 ENCOUNTER — Ambulatory Visit: Payer: Medicare Other | Admitting: Hematology & Oncology

## 2020-08-13 ENCOUNTER — Inpatient Hospital Stay: Payer: Medicare Other | Attending: Hematology & Oncology

## 2020-08-13 ENCOUNTER — Inpatient Hospital Stay (HOSPITAL_BASED_OUTPATIENT_CLINIC_OR_DEPARTMENT_OTHER): Payer: Medicare Other | Admitting: Hematology & Oncology

## 2020-08-13 ENCOUNTER — Other Ambulatory Visit: Payer: Self-pay

## 2020-08-13 ENCOUNTER — Other Ambulatory Visit: Payer: Medicare Other

## 2020-08-13 DIAGNOSIS — C49A2 Gastrointestinal stromal tumor of stomach: Secondary | ICD-10-CM | POA: Insufficient documentation

## 2020-08-13 DIAGNOSIS — C49A0 Gastrointestinal stromal tumor, unspecified site: Secondary | ICD-10-CM

## 2020-08-13 DIAGNOSIS — Z79899 Other long term (current) drug therapy: Secondary | ICD-10-CM | POA: Insufficient documentation

## 2020-08-13 DIAGNOSIS — M7989 Other specified soft tissue disorders: Secondary | ICD-10-CM | POA: Insufficient documentation

## 2020-08-13 LAB — CBC WITH DIFFERENTIAL (CANCER CENTER ONLY)
Abs Immature Granulocytes: 0.03 10*3/uL (ref 0.00–0.07)
Basophils Absolute: 0 10*3/uL (ref 0.0–0.1)
Basophils Relative: 1 %
Eosinophils Absolute: 0.2 10*3/uL (ref 0.0–0.5)
Eosinophils Relative: 3 %
HCT: 31.7 % — ABNORMAL LOW (ref 36.0–46.0)
Hemoglobin: 10.5 g/dL — ABNORMAL LOW (ref 12.0–15.0)
Immature Granulocytes: 0 %
Lymphocytes Relative: 33 %
Lymphs Abs: 2.4 10*3/uL (ref 0.7–4.0)
MCH: 31.6 pg (ref 26.0–34.0)
MCHC: 33.1 g/dL (ref 30.0–36.0)
MCV: 95.5 fL (ref 80.0–100.0)
Monocytes Absolute: 0.8 10*3/uL (ref 0.1–1.0)
Monocytes Relative: 10 %
Neutro Abs: 3.9 10*3/uL (ref 1.7–7.7)
Neutrophils Relative %: 53 %
Platelet Count: 187 10*3/uL (ref 150–400)
RBC: 3.32 MIL/uL — ABNORMAL LOW (ref 3.87–5.11)
RDW: 15.2 % (ref 11.5–15.5)
WBC Count: 7.4 10*3/uL (ref 4.0–10.5)
nRBC: 0 % (ref 0.0–0.2)

## 2020-08-13 LAB — CMP (CANCER CENTER ONLY)
ALT: 10 U/L (ref 0–44)
AST: 15 U/L (ref 15–41)
Albumin: 3.1 g/dL — ABNORMAL LOW (ref 3.5–5.0)
Alkaline Phosphatase: 72 U/L (ref 38–126)
Anion gap: 5 (ref 5–15)
BUN: 18 mg/dL (ref 8–23)
CO2: 30 mmol/L (ref 22–32)
Calcium: 9.5 mg/dL (ref 8.9–10.3)
Chloride: 102 mmol/L (ref 98–111)
Creatinine: 1.3 mg/dL — ABNORMAL HIGH (ref 0.44–1.00)
GFR, Est AFR Am: 42 mL/min — ABNORMAL LOW (ref >60)
GFR, Estimated: 36 mL/min — ABNORMAL LOW (ref >60)
Glucose, Bld: 83 mg/dL (ref 70–99)
Potassium: 4.8 mmol/L (ref 3.5–5.1)
Sodium: 137 mmol/L (ref 135–145)
Total Bilirubin: 0.6 mg/dL (ref 0.3–1.2)
Total Protein: 6.1 g/dL — ABNORMAL LOW (ref 6.5–8.1)

## 2020-08-13 MED ORDER — IOHEXOL 300 MG/ML  SOLN
100.0000 mL | Freq: Once | INTRAMUSCULAR | Status: AC | PRN
  Administered 2020-08-13: 80 mL via INTRAVENOUS

## 2020-08-13 MED ORDER — IMATINIB MESYLATE 400 MG PO TABS: 400.0000 mg | ORAL_TABLET | Freq: Every day | ORAL | 3 refills | Status: DC

## 2020-08-13 NOTE — Progress Notes (Signed)
Hematology and Oncology Follow Up Visit  Monique Lin [id number] [birth year] 86 y.o. 08/13/2020   Principle Diagnosis:   Recurrent GIST of the stomach  Current Therapy:    Gleevec 400 mg p.o.q other day-to start on 01/31/2020 --decrease to 200 mg p.o. q. every other day starting 07/13/2020      Interim History:  Monique Lin is in for follow-up.  Unfortunately, they had problems getting the [place].  They just got the last refill this week.  She was bases Gleevec for several weeks.  There is seem to be an issue with the [facility].  Thankfully, this would not hurt her.  As we did a CT scan today, we found the the mass in the gastric fundus has continued to decrease in size.  It now measures 7.2 x 5.6 cm.  Everything else looks fine.  She is eating okay.  She eats a lot of vegetables.  She really does not eat a lot of meat.  There has been no bleeding.  She has had bleeding in the past.  There is no melena or hematochezia.  She has had no problems with pain.  There is no increased cough or shortness of breath.  She has had no rashes.  There has been little bit of leg swelling but this is chronic.  Her last iron studies done back in August showed a ferritin of 188 with an iron saturation of 49%.   Currently, I would say her performance status is probably ECOG 2 at best.  Medications:  Current Outpatient Medications:  .  acetaminophen (TYLENOL) 650 MG CR tablet, Take 650 mg by mouth every 8 (eight) hours as needed for pain., Disp: , Rfl:  .  albuterol (PROVENTIL) (2.5 MG/3ML) 0.083% nebulizer solution, Take 2.5 mg by nebulization every 6 (six) hours as needed for wheezing., Disp: , Rfl:  .  albuterol (VENTOLIN HFA) 108 (90 Base) MCG/ACT inhaler, Inhale 1 puff into the lungs every 6 (six) hours as needed for wheezing or shortness of breath., Disp: , Rfl:  .  alum & mag hydroxide-simeth (MAALOX/MYLANTA) 259-563-87 MG/5ML suspension, Take 30 mLs by mouth every 6 (six) hours as needed for  indigestion, heartburn or flatulence. (Patient not taking: Reported on 05/04/2020), Disp: 355 mL, Rfl: 0 .  atorvastatin (LIPITOR) 20 MG tablet, Take 20 mg by mouth daily., Disp: , Rfl:  .  bisacodyl (DULCOLAX) 5 MG EC tablet, Take 1 tablet (5 mg total) by mouth daily as needed for moderate constipation. (Patient not taking: Reported on 05/04/2020), Disp: 30 tablet, Rfl: 0 .  carvedilol (COREG) 6.25 MG tablet, Take 1 tablet (6.25 mg total) by mouth 2 (two) times daily with a meal., Disp: 14 tablet, Rfl: 0 .  Cholecalciferol (VITAMIN D-3) 125 MCG (5000 UT) TABS, Take 5,000 Units by mouth daily. , Disp: , Rfl:  .  Cyanocobalamin (VITAMIN B-12 PO), Take 500 mcg by mouth daily. , Disp: , Rfl:  .  fexofenadine (ALLEGRA) 60 MG tablet, Take 60 mg by mouth daily., Disp: , Rfl:  .  fluticasone (FLONASE) 50 MCG/ACT nasal spray, Place 2 sprays into both nostrils daily as needed for allergies or rhinitis. , Disp: , Rfl:  .  folic acid (FOLVITE) 564 MCG tablet, Take 800 mcg by mouth daily. , Disp: , Rfl:  .  furosemide (LASIX) 20 MG tablet, Take 1 tablet (20 mg total) by mouth daily., Disp: 30 tablet, Rfl: 6 .  gabapentin (NEURONTIN) 300 MG capsule, Take 1 capsule (300 mg total)  by mouth 2 (two) times daily., Disp: 180 capsule, Rfl: 1 .  imatinib (GLEEVEC) 400 MG tablet, Take 0.5 tablets (200 mg total) by mouth every other day. Take with meals and large glass of water.Caution:Chemotherapy, Disp: 45 tablet, Rfl: 3 .  levothyroxine (SYNTHROID, LEVOTHROID) 50 MCG tablet, Take 50 mcg by mouth daily., Disp: , Rfl:  .  ondansetron (ZOFRAN) 8 MG tablet, Take 1 tablet (8 mg total) by mouth every 8 (eight) hours as needed for nausea or vomiting., Disp: 20 tablet, Rfl: 0 .  pantoprazole (PROTONIX) 40 MG tablet, Take 1 tablet (40 mg total) by mouth 2 (two) times daily., Disp: 20 tablet, Rfl: 0 .  potassium chloride SA (KLOR-CON) 20 MEQ tablet, Take 1 tablet (20 mEq total) by mouth daily., Disp: 90 tablet, Rfl: 1 .  trolamine  salicylate (ASPERCREME) 10 % cream, Apply 1 application topically as needed for muscle pain., Disp: , Rfl:  .  vitamin E 180 MG (400 UNITS) capsule, Take 400 Units by mouth daily., Disp: , Rfl:   Allergies:  Allergies  Allergen Reactions  . Codeine Other (See Comments)    Stomach cramps  . Penicillins Other (See Comments)    "Bad stomach cramps" Has patient had a PCN reaction causing immediate rash, facial/tongue/throat swelling, SOB or lightheadedness with hypotension: Unk Has patient had a PCN reaction causing severe rash involving mucus membranes or skin necrosis: Unk Has patient had a PCN reaction that required hospitalization: Unk Has patient had a PCN reaction occurring within the last 10 years: No If all of the above answers are "NO", then may proceed with Cephalosporin use.      Past Medical History, Surgical history, Social history, and Family History were reviewed and updated.  Review of Systems: Review of Systems  Constitutional: Negative.   HENT:  Negative.   Eyes: Negative.   Respiratory: Positive for wheezing.   Cardiovascular: Negative.   Gastrointestinal: Positive for abdominal pain.  Endocrine: Negative.   Genitourinary: Negative.    Musculoskeletal: Negative.   Skin: Negative.   Neurological: Negative.   Hematological: Negative.   Psychiatric/Behavioral: Negative.     Physical Exam:  oral temperature is 97.8 F (36.6 C). Her blood pressure is 187/82 (abnormal) and her pulse is 88. Her respiration is 18 and oxygen saturation is 98%.   Wt Readings from Last 3 Encounters:  07/14/20 170 lb 1.9 oz (77.2 kg)  05/21/20 175 lb (79.4 kg)  05/03/20 185 lb 3 oz (84 kg)    Physical Exam Vitals reviewed.  HENT:     Head: Normocephalic and atraumatic.  Eyes:     Pupils: Pupils are equal, round, and reactive to light.  Cardiovascular:     Rate and Rhythm: Normal rate and regular rhythm.     Heart sounds: Normal heart sounds.  Pulmonary:     Effort:  Pulmonary effort is normal.     Breath sounds: Normal breath sounds.  Abdominal:     General: Bowel sounds are normal.     Palpations: Abdomen is soft.  Musculoskeletal:        General: No tenderness or deformity. Normal range of motion.     Cervical back: Normal range of motion.  Lymphadenopathy:     Cervical: No cervical adenopathy.  Skin:    General: Skin is warm and dry.     Findings: No erythema or rash.  Neurological:     Mental Status: She is alert and oriented to person, place, and time.  Psychiatric:  Behavior: Behavior normal.        Thought Content: Thought content normal.        Judgment: Judgment normal.      Lab Results  Component Value Date   WBC 7.4 08/13/2020   HGB 10.5 (L) 08/13/2020   HCT 31.7 (L) 08/13/2020   MCV 95.5 08/13/2020   PLT 187 08/13/2020     Chemistry      Component Value Date/Time   NA 137 08/13/2020 1513   K 4.8 08/13/2020 1513   CL 102 08/13/2020 1513   CO2 30 08/13/2020 1513   BUN 18 08/13/2020 1513   CREATININE 1.30 (H) 08/13/2020 1513      Component Value Date/Time   CALCIUM 9.5 08/13/2020 1513   ALKPHOS 72 08/13/2020 1513   AST 15 08/13/2020 1513   ALT 10 08/13/2020 1513   BILITOT 0.6 08/13/2020 1513      Impression and Plan: Ms. Leicht is a 86 year old African-American female.  She has a recurrent gastrointestinal stromal tumor of the stomach.  This is not operable.  The CT scan continues to show that [place] is working nicely.  As such, I really think we do not need to make any changes in our Gleevec protocol.  I do not think she needs another scan probably until December.  I am just happy that her quality of life is doing well.  This is clearly important for her.  Her daughter, comes in with her, has been very helpful with respect to let us know what is going on.  I will plan to get her back in about 6 weeks now.    Volanda Napoleon, MD 9/10/20215:19 PM

## 2020-08-16 ENCOUNTER — Telehealth: Payer: Self-pay | Admitting: Hematology & Oncology

## 2020-08-16 LAB — LACTATE DEHYDROGENASE: LDH: 252 U/L — ABNORMAL HIGH (ref 98–192)

## 2020-08-16 LAB — IRON AND TIBC
Iron: 58 ug/dL (ref 41–142)
Saturation Ratios: 40 % (ref 21–57)
TIBC: 145 ug/dL — ABNORMAL LOW (ref 236–444)
UIBC: 87 ug/dL — ABNORMAL LOW (ref 120–384)

## 2020-08-16 LAB — FERRITIN: Ferritin: 188 ng/mL (ref 11–307)

## 2020-08-16 NOTE — Telephone Encounter (Signed)
Appointments scheduled calendar printed & mailed per 9/10 los

## 2020-08-17 ENCOUNTER — Other Ambulatory Visit: Payer: Medicare Other

## 2020-08-17 ENCOUNTER — Other Ambulatory Visit (HOSPITAL_BASED_OUTPATIENT_CLINIC_OR_DEPARTMENT_OTHER): Payer: Medicare Other

## 2020-08-17 ENCOUNTER — Ambulatory Visit: Payer: Medicare Other | Admitting: Hematology & Oncology

## 2020-09-21 ENCOUNTER — Other Ambulatory Visit: Payer: Self-pay | Admitting: Hematology & Oncology

## 2020-09-22 ENCOUNTER — Other Ambulatory Visit: Payer: Self-pay | Admitting: *Deleted

## 2020-09-22 DIAGNOSIS — C49A2 Gastrointestinal stromal tumor of stomach: Secondary | ICD-10-CM

## 2020-09-22 MED ORDER — IMATINIB MESYLATE 400 MG PO TABS: 400.0000 mg | ORAL_TABLET | Freq: Every day | ORAL | 3 refills | Status: DC

## 2020-09-28 ENCOUNTER — Ambulatory Visit: Payer: Medicare Other | Admitting: Hematology & Oncology

## 2020-09-28 ENCOUNTER — Other Ambulatory Visit: Payer: Medicare Other

## 2020-09-29 ENCOUNTER — Inpatient Hospital Stay (HOSPITAL_BASED_OUTPATIENT_CLINIC_OR_DEPARTMENT_OTHER): Payer: Medicare Other | Admitting: Hematology & Oncology

## 2020-09-29 ENCOUNTER — Other Ambulatory Visit: Payer: Self-pay

## 2020-09-29 ENCOUNTER — Inpatient Hospital Stay: Payer: Medicare Other | Attending: Hematology & Oncology

## 2020-09-29 ENCOUNTER — Encounter: Payer: Self-pay | Admitting: Hematology & Oncology

## 2020-09-29 VITALS — BP 184/77 | HR 80 | Temp 98.4°F | Resp 20 | Wt 170.8 lb

## 2020-09-29 DIAGNOSIS — Z79899 Other long term (current) drug therapy: Secondary | ICD-10-CM | POA: Insufficient documentation

## 2020-09-29 DIAGNOSIS — C49A2 Gastrointestinal stromal tumor of stomach: Secondary | ICD-10-CM

## 2020-09-29 DIAGNOSIS — C49A0 Gastrointestinal stromal tumor, unspecified site: Secondary | ICD-10-CM

## 2020-09-29 LAB — CMP (CANCER CENTER ONLY)
ALT: 10 U/L (ref 0–44)
AST: 18 U/L (ref 15–41)
Albumin: 3.5 g/dL (ref 3.5–5.0)
Alkaline Phosphatase: 67 U/L (ref 38–126)
Anion gap: 8 (ref 5–15)
BUN: 17 mg/dL (ref 8–23)
CO2: 26 mmol/L (ref 22–32)
Calcium: 9.4 mg/dL (ref 8.9–10.3)
Chloride: 107 mmol/L (ref 98–111)
Creatinine: 1.21 mg/dL — ABNORMAL HIGH (ref 0.44–1.00)
GFR, Estimated: 42 mL/min — ABNORMAL LOW (ref >60)
Glucose, Bld: 99 mg/dL (ref 70–99)
Potassium: 4.7 mmol/L (ref 3.5–5.1)
Sodium: 141 mmol/L (ref 135–145)
Total Bilirubin: 0.4 mg/dL (ref 0.3–1.2)
Total Protein: 6.8 g/dL (ref 6.5–8.1)

## 2020-09-29 LAB — CBC WITH DIFFERENTIAL (CANCER CENTER ONLY)
Abs Immature Granulocytes: 0.03 10*3/uL (ref 0.00–0.07)
Basophils Absolute: 0 10*3/uL (ref 0.0–0.1)
Basophils Relative: 0 %
Eosinophils Absolute: 0.1 10*3/uL (ref 0.0–0.5)
Eosinophils Relative: 2 %
HCT: 33.3 % — ABNORMAL LOW (ref 36.0–46.0)
Hemoglobin: 10.9 g/dL — ABNORMAL LOW (ref 12.0–15.0)
Immature Granulocytes: 0 %
Lymphocytes Relative: 23 %
Lymphs Abs: 1.8 10*3/uL (ref 0.7–4.0)
MCH: 31.6 pg (ref 26.0–34.0)
MCHC: 32.7 g/dL (ref 30.0–36.0)
MCV: 96.5 fL (ref 80.0–100.0)
Monocytes Absolute: 0.6 10*3/uL (ref 0.1–1.0)
Monocytes Relative: 8 %
Neutro Abs: 5.2 10*3/uL (ref 1.7–7.7)
Neutrophils Relative %: 67 %
Platelet Count: 141 10*3/uL — ABNORMAL LOW (ref 150–400)
RBC: 3.45 MIL/uL — ABNORMAL LOW (ref 3.87–5.11)
RDW: 14.6 % (ref 11.5–15.5)
WBC Count: 7.7 10*3/uL (ref 4.0–10.5)
nRBC: 0 % (ref 0.0–0.2)

## 2020-09-29 LAB — LACTATE DEHYDROGENASE: LDH: 167 U/L (ref 98–192)

## 2020-09-29 NOTE — Progress Notes (Signed)
Hematology and Oncology Follow Up Visit  Monique Lin [id number] [birth year] 86 y.o. 09/29/2020   Principle Diagnosis:   Recurrent GIST of the stomach  Current Therapy:    Gleevec 400 mg p.o.q other day-to start on 01/31/2020 --decrease to 200 mg p.o. q. every other day starting 07/13/2020      Interim History:  Monique Lin is in for follow-up.  She is doing pretty well.  She has had no problems with the [place].  They are getting the [place] and she now is able to take it on a regular basis.  There is no abdominal pain.  She is having no problems with cough.  She is having some sinus issues which she says is from the change in seasons.  She has had no obvious change in bowel or bladder habits.  There have been no bleeding.  She has had no melena or hematochezia.  Her appetite has been good.  She is not a big meat eater.  She does eat a lot of vegetables.  She has had no rashes.  She has had no fever.  She has had no nausea or vomiting.  Overall, I would say performance status by ECOG 2.    Medications:  Current Outpatient Medications:  .  acetaminophen (TYLENOL) 650 MG CR tablet, Take 650 mg by mouth every 8 (eight) hours as needed for pain., Disp: , Rfl:  .  albuterol (PROVENTIL) (2.5 MG/3ML) 0.083% nebulizer solution, Take 2.5 mg by nebulization every 6 (six) hours as needed for wheezing., Disp: , Rfl:  .  albuterol (VENTOLIN HFA) 108 (90 Base) MCG/ACT inhaler, Inhale 1 puff into the lungs every 6 (six) hours as needed for wheezing or shortness of breath., Disp: , Rfl:  .  alum & mag hydroxide-simeth (MAALOX/MYLANTA) 607-371-06 MG/5ML suspension, Take 30 mLs by mouth every 6 (six) hours as needed for indigestion, heartburn or flatulence., Disp: 355 mL, Rfl: 0 .  carvedilol (COREG) 6.25 MG tablet, Take 1 tablet (6.25 mg total) by mouth 2 (two) times daily with a meal., Disp: 14 tablet, Rfl: 0 .  Cholecalciferol (VITAMIN D-3) 125 MCG (5000 UT) TABS, Take 5,000 Units by mouth daily. ,  Disp: , Rfl:  .  Cyanocobalamin (VITAMIN B-12 PO), Take 500 mcg by mouth daily. , Disp: , Rfl:  .  fexofenadine (ALLEGRA) 60 MG tablet, Take 60 mg by mouth daily., Disp: , Rfl:  .  folic acid (FOLVITE) 269 MCG tablet, Take 800 mcg by mouth daily. , Disp: , Rfl:  .  gabapentin (NEURONTIN) 300 MG capsule, Take 1 capsule (300 mg total) by mouth 2 (two) times daily., Disp: 180 capsule, Rfl: 1 .  imatinib (GLEEVEC) 400 MG tablet, Take 1 tablet (400 mg total) by mouth daily. Take with meals and large glass of water.Caution:Chemotherapy, Disp: 45 tablet, Rfl: 3 .  levothyroxine (SYNTHROID, LEVOTHROID) 50 MCG tablet, Take 50 mcg by mouth daily., Disp: , Rfl:  .  ondansetron (ZOFRAN) 8 MG tablet, Take 1 tablet (8 mg total) by mouth every 8 (eight) hours as needed for nausea or vomiting., Disp: 20 tablet, Rfl: 0 .  pantoprazole (PROTONIX) 40 MG tablet, TAKE 1 TABLET(40 MG) BY MOUTH TWICE DAILY, Disp: 20 tablet, Rfl: 0 .  potassium chloride SA (KLOR-CON) 20 MEQ tablet, Take 1 tablet (20 mEq total) by mouth daily., Disp: 90 tablet, Rfl: 1 .  trolamine salicylate (ASPERCREME) 10 % cream, Apply 1 application topically as needed for muscle pain., Disp: , Rfl:  .  vitamin  E 180 MG (400 UNITS) capsule, Take 400 Units by mouth daily., Disp: , Rfl:  .  atorvastatin (LIPITOR) 20 MG tablet, Take 20 mg by mouth daily., Disp: , Rfl:  .  bisacodyl (DULCOLAX) 5 MG EC tablet, Take 1 tablet (5 mg total) by mouth daily as needed for moderate constipation. (Patient not taking: Reported on 05/04/2020), Disp: 30 tablet, Rfl: 0 .  fluticasone (FLONASE) 50 MCG/ACT nasal spray, Place 2 sprays into both nostrils daily as needed for allergies or rhinitis.  (Patient not taking: Reported on 09/29/2020), Disp: , Rfl:   Allergies:  Allergies  Allergen Reactions  . Codeine Other (See Comments)    Stomach cramps  . Penicillins Other (See Comments)    "Bad stomach cramps" Has patient had a PCN reaction causing immediate rash,  facial/tongue/throat swelling, SOB or lightheadedness with hypotension: Unk Has patient had a PCN reaction causing severe rash involving mucus membranes or skin necrosis: Unk Has patient had a PCN reaction that required hospitalization: Unk Has patient had a PCN reaction occurring within the last 10 years: No If all of the above answers are "NO", then may proceed with Cephalosporin use.      Past Medical History, Surgical history, Social history, and Family History were reviewed and updated.  Review of Systems: Review of Systems  Constitutional: Negative.   HENT:  Negative.   Eyes: Negative.   Respiratory: Positive for wheezing.   Cardiovascular: Negative.   Gastrointestinal: Positive for abdominal pain.  Endocrine: Negative.   Genitourinary: Negative.    Musculoskeletal: Negative.   Skin: Negative.   Neurological: Negative.   Hematological: Negative.   Psychiatric/Behavioral: Negative.     Physical Exam:  weight is 170 lb 12.8 oz (77.5 kg). Her oral temperature is 98.4 F (36.9 C). Her blood pressure is 184/77 (abnormal) and her pulse is 80. Her respiration is 20 and oxygen saturation is 97%.   Wt Readings from Last 3 Encounters:  09/29/20 170 lb 12.8 oz (77.5 kg)  07/14/20 170 lb 1.9 oz (77.2 kg)  05/21/20 175 lb (79.4 kg)    Physical Exam Vitals reviewed.  HENT:     Head: Normocephalic and atraumatic.  Eyes:     Pupils: Pupils are equal, round, and reactive to light.  Cardiovascular:     Rate and Rhythm: Normal rate and regular rhythm.     Heart sounds: Normal heart sounds.  Pulmonary:     Effort: Pulmonary effort is normal.     Breath sounds: Normal breath sounds.  Abdominal:     General: Bowel sounds are normal.     Palpations: Abdomen is soft.  Musculoskeletal:        General: No tenderness or deformity. Normal range of motion.     Cervical back: Normal range of motion.  Lymphadenopathy:     Cervical: No cervical adenopathy.  Skin:    General: Skin is  warm and dry.     Findings: No erythema or rash.  Neurological:     Mental Status: She is alert and oriented to person, place, and time.  Psychiatric:        Behavior: Behavior normal.        Thought Content: Thought content normal.        Judgment: Judgment normal.      Lab Results  Component Value Date   WBC 7.7 09/29/2020   HGB 10.9 (L) 09/29/2020   HCT 33.3 (L) 09/29/2020   MCV 96.5 09/29/2020   PLT 141 (L) 09/29/2020  Chemistry      Component Value Date/Time   NA 141 09/29/2020 1516   K 4.7 09/29/2020 1516   CL 107 09/29/2020 1516   CO2 26 09/29/2020 1516   BUN 17 09/29/2020 1516   CREATININE 1.21 (H) 09/29/2020 1516      Component Value Date/Time   CALCIUM 9.4 09/29/2020 1516   ALKPHOS 67 09/29/2020 1516   AST 18 09/29/2020 1516   ALT 10 09/29/2020 1516   BILITOT 0.4 09/29/2020 1516      Impression and Plan: Monique Lin is a 86 year old African-American female.  She has a recurrent gastrointestinal stromal tumor of the stomach.  This is not operable.  I am very happy that her hemoglobin is holding steady.  This, to me, indicates that she is not bleeding.  We will go ahead and plan for a follow-up scan when we see her back.  We will see her back in early December.  Clearly, her quality of life is what is our primary goal.  She is really doing nicely.  I hope that she will have a wonderful Thanksgiving with her family.    Volanda Napoleon, MD 10/27/20214:15 PM

## 2020-09-30 ENCOUNTER — Telehealth: Payer: Self-pay | Admitting: Hematology & Oncology

## 2020-09-30 LAB — IRON AND TIBC
Iron: 56 ug/dL (ref 41–142)
Saturation Ratios: 30 % (ref 21–57)
TIBC: 189 ug/dL — ABNORMAL LOW (ref 236–444)
UIBC: 132 ug/dL (ref 120–384)

## 2020-09-30 LAB — FERRITIN: Ferritin: 234 ng/mL (ref 11–307)

## 2020-09-30 NOTE — Telephone Encounter (Signed)
Called and LMVM for patient regarding follow up appointments scheduled per 10/27 los.

## 2020-11-02 ENCOUNTER — Telehealth: Payer: Self-pay

## 2020-11-02 NOTE — Telephone Encounter (Signed)
Called pt to r/s her 11/17/20 appts due to site closing early, pt stated that she could hear me but there was something wrong with her phone and asked to call me back.  I have already moved her appts and a calendar will be mailed as she has yet to return the call.... AOM

## 2020-11-11 ENCOUNTER — Other Ambulatory Visit: Payer: Medicare Other

## 2020-11-11 ENCOUNTER — Ambulatory Visit: Payer: Medicare Other | Admitting: Hematology & Oncology

## 2020-11-17 ENCOUNTER — Other Ambulatory Visit: Payer: Medicare Other

## 2020-11-17 ENCOUNTER — Ambulatory Visit: Payer: Medicare Other | Admitting: Hematology & Oncology

## 2020-11-17 ENCOUNTER — Other Ambulatory Visit (HOSPITAL_BASED_OUTPATIENT_CLINIC_OR_DEPARTMENT_OTHER): Payer: Medicare Other

## 2020-11-19 ENCOUNTER — Encounter: Payer: Self-pay | Admitting: Hematology & Oncology

## 2020-11-19 ENCOUNTER — Telehealth: Payer: Self-pay

## 2020-11-19 ENCOUNTER — Other Ambulatory Visit: Payer: Self-pay

## 2020-11-19 ENCOUNTER — Inpatient Hospital Stay: Payer: Medicare Other | Attending: Hematology & Oncology | Admitting: Hematology & Oncology

## 2020-11-19 ENCOUNTER — Inpatient Hospital Stay: Payer: Medicare Other

## 2020-11-19 ENCOUNTER — Ambulatory Visit (HOSPITAL_BASED_OUTPATIENT_CLINIC_OR_DEPARTMENT_OTHER)
Admission: RE | Admit: 2020-11-19 | Discharge: 2020-11-19 | Disposition: A | Discharge status: Home / Self Care | Payer: Medicare Other | Source: Ambulatory Visit | Attending: Hematology & Oncology | Admitting: Hematology & Oncology

## 2020-11-19 ENCOUNTER — Telehealth: Payer: Self-pay | Admitting: *Deleted

## 2020-11-19 VITALS — BP 222/82 | HR 87 | Temp 97.8°F | Resp 18 | Wt 176.0 lb

## 2020-11-19 DIAGNOSIS — C49A0 Gastrointestinal stromal tumor, unspecified site: Secondary | ICD-10-CM | POA: Diagnosis present

## 2020-11-19 DIAGNOSIS — C49A2 Gastrointestinal stromal tumor of stomach: Secondary | ICD-10-CM | POA: Insufficient documentation

## 2020-11-19 DIAGNOSIS — Z79899 Other long term (current) drug therapy: Secondary | ICD-10-CM | POA: Diagnosis not present

## 2020-11-19 LAB — CBC WITH DIFFERENTIAL (CANCER CENTER ONLY)
Abs Immature Granulocytes: 0.02 10*3/uL (ref 0.00–0.07)
Basophils Absolute: 0 10*3/uL (ref 0.0–0.1)
Basophils Relative: 1 %
Eosinophils Absolute: 0.1 10*3/uL (ref 0.0–0.5)
Eosinophils Relative: 3 %
HCT: 35.8 % — ABNORMAL LOW (ref 36.0–46.0)
Hemoglobin: 11.4 g/dL — ABNORMAL LOW (ref 12.0–15.0)
Immature Granulocytes: 0 %
Lymphocytes Relative: 29 %
Lymphs Abs: 1.6 10*3/uL (ref 0.7–4.0)
MCH: 31.1 pg (ref 26.0–34.0)
MCHC: 31.8 g/dL (ref 30.0–36.0)
MCV: 97.8 fL (ref 80.0–100.0)
Monocytes Absolute: 0.5 10*3/uL (ref 0.1–1.0)
Monocytes Relative: 9 %
Neutro Abs: 3.3 10*3/uL (ref 1.7–7.7)
Neutrophils Relative %: 58 %
Platelet Count: 208 10*3/uL (ref 150–400)
RBC: 3.66 MIL/uL — ABNORMAL LOW (ref 3.87–5.11)
RDW: 15.2 % (ref 11.5–15.5)
WBC Count: 5.6 10*3/uL (ref 4.0–10.5)
nRBC: 0 % (ref 0.0–0.2)

## 2020-11-19 LAB — LACTATE DEHYDROGENASE: LDH: 178 U/L (ref 98–192)

## 2020-11-19 LAB — CMP (CANCER CENTER ONLY)
ALT: 6 U/L (ref 0–44)
AST: 12 U/L — ABNORMAL LOW (ref 15–41)
Albumin: 4 g/dL (ref 3.5–5.0)
Alkaline Phosphatase: 68 U/L (ref 38–126)
Anion gap: 8 (ref 5–15)
BUN: 21 mg/dL (ref 8–23)
CO2: 26 mmol/L (ref 22–32)
Calcium: 9.8 mg/dL (ref 8.9–10.3)
Chloride: 106 mmol/L (ref 98–111)
Creatinine: 1.4 mg/dL — ABNORMAL HIGH (ref 0.44–1.00)
GFR, Estimated: 36 mL/min — ABNORMAL LOW (ref >60)
Glucose, Bld: 83 mg/dL (ref 70–99)
Potassium: 4.5 mmol/L (ref 3.5–5.1)
Sodium: 140 mmol/L (ref 135–145)
Total Bilirubin: 0.5 mg/dL (ref 0.3–1.2)
Total Protein: 7.3 g/dL (ref 6.5–8.1)

## 2020-11-19 MED ORDER — IOHEXOL 300 MG/ML  SOLN
100.0000 mL | Freq: Once | INTRAMUSCULAR | Status: AC
  Administered 2020-11-19: 15:00:00 90 mL via INTRAVENOUS

## 2020-11-19 NOTE — Telephone Encounter (Signed)
Call received from patient's daughter [name] stating that patient ate a banana and a half a cup of coffee this morning and would like to know if CT appt can be moved back or rescheduled.  Sherry notified per radiology at [facility] that Josephine scan will be moved to 3:00PM today.  [name] is appreciative of assistance and has no further questions at this time.

## 2020-11-19 NOTE — Telephone Encounter (Signed)
Called pt per 11/19/20 los, no answer/vm, calendar printed and mailed to the pt    aom

## 2020-11-19 NOTE — Progress Notes (Signed)
Hematology and Oncology Follow Up Visit  Monique Lin [id number] 1927-04-28 86 y.o. 11/19/2020   Principle Diagnosis:   Recurrent GIST of the stomach  Current Therapy:    Gleevec 400 mg p.o.q other day-to start on 01/31/2020 --decrease to 200 mg p.o. q. every other day starting 07/13/2020      Interim History:  [name] is in for follow-up.  She is doing pretty well.  She has had no problems with the [place].  She is yet to have her CT scan.  She will have it this afternoon after we see her.  She has had really no abdominal pain.  Her appetite is about the same.  She has had no problems with bleeding.  Her hemoglobin keeps trending upward.  There is been no fever.  She has had no headache.  She has had no problems with leg swelling.  She has had no rashes.  She had a nice Thanksgiving.  She is looking forward to a nice and quiet Christmas.  She has had no nausea or vomiting.  There is no diarrhea.  She is having no issues going to the bathroom.  Overall, her performance status is ECOG 1.    Medications:  Current Outpatient Medications:  .  acetaminophen (TYLENOL) 650 MG CR tablet, Take 650 mg by mouth every 8 (eight) hours as needed for pain., Disp: , Rfl:  .  albuterol (PROVENTIL) (2.5 MG/3ML) 0.083% nebulizer solution, Take 2.5 mg by nebulization every 6 (six) hours as needed for wheezing., Disp: , Rfl:  .  albuterol (VENTOLIN HFA) 108 (90 Base) MCG/ACT inhaler, Inhale 1 puff into the lungs every 6 (six) hours as needed for wheezing or shortness of breath., Disp: , Rfl:  .  alum & mag hydroxide-simeth (MAALOX/MYLANTA) 466-599-35 MG/5ML suspension, Take 30 mLs by mouth every 6 (six) hours as needed for indigestion, heartburn or flatulence., Disp: 355 mL, Rfl: 0 .  atorvastatin (LIPITOR) 20 MG tablet, Take 20 mg by mouth daily., Disp: , Rfl:  .  bisacodyl (DULCOLAX) 5 MG EC tablet, Take 1 tablet (5 mg total) by mouth daily as needed for moderate constipation. (Patient not taking:  Reported on 05/04/2020), Disp: 30 tablet, Rfl: 0 .  carvedilol (COREG) 6.25 MG tablet, Take 1 tablet (6.25 mg total) by mouth 2 (two) times daily with a meal., Disp: 14 tablet, Rfl: 0 .  Cholecalciferol (VITAMIN D-3) 125 MCG (5000 UT) TABS, Take 5,000 Units by mouth daily. , Disp: , Rfl:  .  Cyanocobalamin (VITAMIN B-12 PO), Take 500 mcg by mouth daily. , Disp: , Rfl:  .  fexofenadine (ALLEGRA) 60 MG tablet, Take 60 mg by mouth daily., Disp: , Rfl:  .  fluticasone (FLONASE) 50 MCG/ACT nasal spray, Place 2 sprays into both nostrils daily as needed for allergies or rhinitis.  (Patient not taking: Reported on 09/29/2020), Disp: , Rfl:  .  folic acid (FOLVITE) 701 MCG tablet, Take 800 mcg by mouth daily. , Disp: , Rfl:  .  gabapentin (NEURONTIN) 300 MG capsule, Take 1 capsule (300 mg total) by mouth 2 (two) times daily., Disp: 180 capsule, Rfl: 1 .  imatinib (GLEEVEC) 400 MG tablet, Take 1 tablet (400 mg total) by mouth daily. Take with meals and large glass of water.Caution:Chemotherapy, Disp: 45 tablet, Rfl: 3 .  levothyroxine (SYNTHROID, LEVOTHROID) 50 MCG tablet, Take 50 mcg by mouth daily., Disp: , Rfl:  .  ondansetron (ZOFRAN) 8 MG tablet, Take 1 tablet (8 mg total) by mouth every 8 (  eight) hours as needed for nausea or vomiting., Disp: 20 tablet, Rfl: 0 .  pantoprazole (PROTONIX) 40 MG tablet, TAKE 1 TABLET(40 MG) BY MOUTH TWICE DAILY, Disp: 20 tablet, Rfl: 0 .  potassium chloride SA (KLOR-CON) 20 MEQ tablet, Take 1 tablet (20 mEq total) by mouth daily., Disp: 90 tablet, Rfl: 1 .  trolamine salicylate (ASPERCREME) 10 % cream, Apply 1 application topically as needed for muscle pain., Disp: , Rfl:  .  vitamin E 180 MG (400 UNITS) capsule, Take 400 Units by mouth daily., Disp: , Rfl:   Allergies:  Allergies  Allergen Reactions  . Codeine Other (See Comments)    Stomach cramps  . Penicillins Other (See Comments)    "Bad stomach cramps" Has patient had a PCN reaction causing immediate rash,  facial/tongue/throat swelling, SOB or lightheadedness with hypotension: Unk Has patient had a PCN reaction causing severe rash involving mucus membranes or skin necrosis: Unk Has patient had a PCN reaction that required hospitalization: Unk Has patient had a PCN reaction occurring within the last 10 years: No If all of the above answers are "NO", then may proceed with Cephalosporin use.      Past Medical History, Surgical history, Social history, and Family History were reviewed and updated.  Review of Systems: Review of Systems  Constitutional: Negative.   HENT:  Negative.   Eyes: Negative.   Respiratory: Positive for wheezing.   Cardiovascular: Negative.   Gastrointestinal: Positive for abdominal pain.  Endocrine: Negative.   Genitourinary: Negative.    Musculoskeletal: Negative.   Skin: Negative.   Neurological: Negative.   Hematological: Negative.   Psychiatric/Behavioral: Negative.     Physical Exam:  weight is 176 lb (79.8 kg). Her oral temperature is 97.8 F (36.6 C). Her blood pressure is 222/82 (abnormal) and her pulse is 87. Her respiration is 18 and oxygen saturation is 98%.   Wt Readings from Last 3 Encounters:  11/19/20 176 lb (79.8 kg)  09/29/20 170 lb 12.8 oz (77.5 kg)  07/14/20 170 lb 1.9 oz (77.2 kg)    Physical Exam Vitals reviewed.  HENT:     Head: Normocephalic and atraumatic.  Eyes:     Pupils: Pupils are equal, round, and reactive to light.  Cardiovascular:     Rate and Rhythm: Normal rate and regular rhythm.     Heart sounds: Normal heart sounds.  Pulmonary:     Effort: Pulmonary effort is normal.     Breath sounds: Normal breath sounds.  Abdominal:     General: Bowel sounds are normal.     Palpations: Abdomen is soft.  Musculoskeletal:        General: No tenderness or deformity. Normal range of motion.     Cervical back: Normal range of motion.  Lymphadenopathy:     Cervical: No cervical adenopathy.  Skin:    General: Skin is warm and  dry.     Findings: No erythema or rash.  Neurological:     Mental Status: She is alert and oriented to person, place, and time.  Psychiatric:        Behavior: Behavior normal.        Thought Content: Thought content normal.        Judgment: Judgment normal.      Lab Results  Component Value Date   WBC 5.6 11/19/2020   HGB 11.4 (L) 11/19/2020   HCT 35.8 (L) 11/19/2020   MCV 97.8 11/19/2020   PLT 208 11/19/2020     Chemistry  Component Value Date/Time   NA 140 11/19/2020 1318   K 4.5 11/19/2020 1318   CL 106 11/19/2020 1318   CO2 26 11/19/2020 1318   BUN 21 11/19/2020 1318   CREATININE 1.40 (H) 11/19/2020 1318      Component Value Date/Time   CALCIUM 9.8 11/19/2020 1318   ALKPHOS 68 11/19/2020 1318   AST 12 (L) 11/19/2020 1318   ALT 6 11/19/2020 1318   BILITOT 0.5 11/19/2020 1318      Impression and Plan: Ms. Dowler is a 86 year old African-American female.  She really does not look her age.  She is still quite spunky.  Hopefully, we will see that she is still responding.  I would have to think that by the increase in hemoglobin that she is responding and that this tumor is shrinking.  I am glad that her quality life is doing well.  This really is the focus of our therapy for her.  We will see back in her back here in another 4 weeks or so.  If the CT scan looks fine, we will not need another one for about 3 or 4 months.   Volanda Napoleon, MD 12/17/20212:01 PM

## 2020-11-22 ENCOUNTER — Telehealth: Payer: Self-pay | Admitting: *Deleted

## 2020-11-22 LAB — IRON AND TIBC
Iron: 67 ug/dL (ref 41–142)
Saturation Ratios: 30 % (ref 21–57)
TIBC: 221 ug/dL — ABNORMAL LOW (ref 236–444)
UIBC: 154 ug/dL (ref 120–384)

## 2020-11-22 LAB — FERRITIN: Ferritin: 133 ng/mL (ref 11–307)

## 2020-11-22 NOTE — Telephone Encounter (Signed)
-----   Message from Volanda Napoleon, MD sent at 11/19/2020  5:30 PM EST ----- Call - the tumor is a little smaller in the stomach!!  No change in the [place] dose!!  Laurey Arrow

## 2020-11-22 NOTE — Telephone Encounter (Signed)
-----   Message from Volanda Napoleon, MD sent at 11/19/2020  5:30 PM EST ----- Call - the tumor is a little smaller in the stomach!!  No change in the [place] dose!!  Laurey Arrow

## 2020-11-22 NOTE — Telephone Encounter (Signed)
Called pt regarding results, female voice answered advised there is beeping on the line and will have to call back. Gave call back number.

## 2020-11-22 NOTE — Telephone Encounter (Signed)
As noted below by Dr. Marin Olp, I informed the daughter, [name], that the tumor is a little smaller in the stomach! There is no change in the Gleevec dose. She verbalized understanding.

## 2020-12-15 ENCOUNTER — Telehealth: Payer: Self-pay

## 2020-12-15 NOTE — Telephone Encounter (Signed)
Per inbasket message pt needs to move appts to a Friday that she is off and in the pm, done, and a new calendar has been mailed per daughter req      aom

## 2020-12-24 ENCOUNTER — Other Ambulatory Visit: Payer: Self-pay | Admitting: Hematology & Oncology

## 2020-12-24 ENCOUNTER — Ambulatory Visit: Payer: Medicare Other | Admitting: Hematology & Oncology

## 2020-12-24 ENCOUNTER — Other Ambulatory Visit: Payer: Medicare Other

## 2020-12-31 ENCOUNTER — Inpatient Hospital Stay (HOSPITAL_BASED_OUTPATIENT_CLINIC_OR_DEPARTMENT_OTHER): Payer: Medicare Other | Admitting: Hematology & Oncology

## 2020-12-31 ENCOUNTER — Inpatient Hospital Stay: Payer: Medicare Other | Attending: Hematology & Oncology

## 2020-12-31 ENCOUNTER — Encounter: Payer: Self-pay | Admitting: Hematology & Oncology

## 2020-12-31 ENCOUNTER — Other Ambulatory Visit: Payer: Self-pay

## 2020-12-31 VITALS — BP 222/86 | HR 85 | Temp 98.0°F | Resp 19 | Wt 177.0 lb

## 2020-12-31 DIAGNOSIS — C49A0 Gastrointestinal stromal tumor, unspecified site: Secondary | ICD-10-CM | POA: Diagnosis not present

## 2020-12-31 DIAGNOSIS — C49A2 Gastrointestinal stromal tumor of stomach: Secondary | ICD-10-CM | POA: Insufficient documentation

## 2020-12-31 DIAGNOSIS — Z79899 Other long term (current) drug therapy: Secondary | ICD-10-CM | POA: Insufficient documentation

## 2020-12-31 LAB — CMP (CANCER CENTER ONLY)
ALT: 6 U/L (ref 0–44)
AST: 13 U/L — ABNORMAL LOW (ref 15–41)
Albumin: 4 g/dL (ref 3.5–5.0)
Alkaline Phosphatase: 66 U/L (ref 38–126)
Anion gap: 7 (ref 5–15)
BUN: 17 mg/dL (ref 8–23)
CO2: 27 mmol/L (ref 22–32)
Calcium: 10 mg/dL (ref 8.9–10.3)
Chloride: 108 mmol/L (ref 98–111)
Creatinine: 1.14 mg/dL — ABNORMAL HIGH (ref 0.44–1.00)
GFR, Estimated: 45 mL/min — ABNORMAL LOW (ref >60)
Glucose, Bld: 91 mg/dL (ref 70–99)
Potassium: 4.8 mmol/L (ref 3.5–5.1)
Sodium: 142 mmol/L (ref 135–145)
Total Bilirubin: 0.6 mg/dL (ref 0.3–1.2)
Total Protein: 7.1 g/dL (ref 6.5–8.1)

## 2020-12-31 LAB — CBC WITH DIFFERENTIAL (CANCER CENTER ONLY)
Abs Immature Granulocytes: 0.05 10*3/uL (ref 0.00–0.07)
Basophils Absolute: 0 10*3/uL (ref 0.0–0.1)
Basophils Relative: 1 %
Eosinophils Absolute: 0.2 10*3/uL (ref 0.0–0.5)
Eosinophils Relative: 3 %
HCT: 34.5 % — ABNORMAL LOW (ref 36.0–46.0)
Hemoglobin: 11.1 g/dL — ABNORMAL LOW (ref 12.0–15.0)
Immature Granulocytes: 1 %
Lymphocytes Relative: 29 %
Lymphs Abs: 1.9 10*3/uL (ref 0.7–4.0)
MCH: 31 pg (ref 26.0–34.0)
MCHC: 32.2 g/dL (ref 30.0–36.0)
MCV: 96.4 fL (ref 80.0–100.0)
Monocytes Absolute: 0.7 10*3/uL (ref 0.1–1.0)
Monocytes Relative: 10 %
Neutro Abs: 3.8 10*3/uL (ref 1.7–7.7)
Neutrophils Relative %: 56 %
Platelet Count: 177 10*3/uL (ref 150–400)
RBC: 3.58 MIL/uL — ABNORMAL LOW (ref 3.87–5.11)
RDW: 14.5 % (ref 11.5–15.5)
WBC Count: 6.7 10*3/uL (ref 4.0–10.5)
nRBC: 0 % (ref 0.0–0.2)

## 2020-12-31 LAB — LACTATE DEHYDROGENASE: LDH: 157 U/L (ref 98–192)

## 2020-12-31 LAB — RETICULOCYTES
Immature Retic Fract: 7.2 % (ref 2.3–15.9)
RBC.: 3.55 MIL/uL — ABNORMAL LOW (ref 3.87–5.11)
Retic Count, Absolute: 42.2 10*3/uL (ref 19.0–186.0)
Retic Ct Pct: 1.2 % (ref 0.4–3.1)

## 2020-12-31 LAB — SAVE SMEAR(SSMR), FOR PROVIDER SLIDE REVIEW

## 2020-12-31 NOTE — Progress Notes (Signed)
Hematology and Oncology Follow Up Visit  Monique Lin [id number] 10/21/1927 87 y.o. 12/31/2020   Principle Diagnosis:   Recurrent GIST of the stomach  Current Therapy:    Gleevec 400 mg p.o.q other day-to start on 01/31/2020 --decrease to 200 mg p.o. q. every other day starting 07/13/2020      Interim History:  Monique Lin is in for follow-up.  She had a nice Christmas and New Year's.  She seems to be doing fairly well.  I really am not too worried that the GIST tumor is progressing.  We will certainly get a CT scan when we see her again.  Again, her quality of life is doing well.  She has had no bleeding.  She has had no problems with bowels or bladder.  She has had no abdominal pain.  There has been no nausea or vomiting.  She has had no rashes.  There is been no swollen lymph nodes.  Overall, I would have to say that her performance status is ECOG 2.     Medications:  Current Outpatient Medications:  .  acetaminophen (TYLENOL) 650 MG CR tablet, Take 650 mg by mouth every 8 (eight) hours as needed for pain., Disp: , Rfl:  .  albuterol (PROVENTIL) (2.5 MG/3ML) 0.083% nebulizer solution, Take 2.5 mg by nebulization every 6 (six) hours as needed for wheezing., Disp: , Rfl:  .  albuterol (VENTOLIN HFA) 108 (90 Base) MCG/ACT inhaler, Inhale 1 puff into the lungs every 6 (six) hours as needed for wheezing or shortness of breath., Disp: , Rfl:  .  alum & mag hydroxide-simeth (MAALOX/MYLANTA) 626-948-54 MG/5ML suspension, Take 30 mLs by mouth every 6 (six) hours as needed for indigestion, heartburn or flatulence., Disp: 355 mL, Rfl: 0 .  atorvastatin (LIPITOR) 20 MG tablet, Take 20 mg by mouth daily., Disp: , Rfl:  .  bisacodyl (DULCOLAX) 5 MG EC tablet, Take 1 tablet (5 mg total) by mouth daily as needed for moderate constipation. (Patient not taking: Reported on 05/04/2020), Disp: 30 tablet, Rfl: 0 .  carvedilol (COREG) 6.25 MG tablet, Take 1 tablet (6.25 mg total) by mouth 2 (two) times daily  with a meal., Disp: 14 tablet, Rfl: 0 .  Cholecalciferol (VITAMIN D-3) 125 MCG (5000 UT) TABS, Take 5,000 Units by mouth daily. , Disp: , Rfl:  .  Cyanocobalamin (VITAMIN B-12 PO), Take 500 mcg by mouth daily. , Disp: , Rfl:  .  fexofenadine (ALLEGRA) 60 MG tablet, Take 60 mg by mouth daily., Disp: , Rfl:  .  fluticasone (FLONASE) 50 MCG/ACT nasal spray, Place 2 sprays into both nostrils daily as needed for allergies or rhinitis.  (Patient not taking: Reported on 09/29/2020), Disp: , Rfl:  .  folic acid (FOLVITE) 627 MCG tablet, Take 800 mcg by mouth daily. , Disp: , Rfl:  .  gabapentin (NEURONTIN) 300 MG capsule, Take 1 capsule (300 mg total) by mouth 2 (two) times daily., Disp: 180 capsule, Rfl: 1 .  imatinib (GLEEVEC) 400 MG tablet, Take 1 tablet (400 mg total) by mouth daily. Take with meals and large glass of water.Caution:Chemotherapy, Disp: 45 tablet, Rfl: 3 .  levothyroxine (SYNTHROID, LEVOTHROID) 50 MCG tablet, Take 50 mcg by mouth daily., Disp: , Rfl:  .  ondansetron (ZOFRAN) 8 MG tablet, Take 1 tablet (8 mg total) by mouth every 8 (eight) hours as needed for nausea or vomiting., Disp: 20 tablet, Rfl: 0 .  pantoprazole (PROTONIX) 40 MG tablet, TAKE 1 TABLET(40 MG) BY MOUTH TWICE  DAILY, Disp: 20 tablet, Rfl: 0 .  potassium chloride SA (KLOR-CON) 20 MEQ tablet, TAKE 1 TABLET(20 MEQ) BY MOUTH DAILY, Disp: 90 tablet, Rfl: 1 .  trolamine salicylate (ASPERCREME) 10 % cream, Apply 1 application topically as needed for muscle pain., Disp: , Rfl:  .  vitamin E 180 MG (400 UNITS) capsule, Take 400 Units by mouth daily., Disp: , Rfl:   Allergies:  Allergies  Allergen Reactions  . Codeine Other (See Comments)    Stomach cramps  . Penicillins Other (See Comments)    "Bad stomach cramps" Has patient had a PCN reaction causing immediate rash, facial/tongue/throat swelling, SOB or lightheadedness with hypotension: Unk Has patient had a PCN reaction causing severe rash involving mucus membranes or  skin necrosis: Unk Has patient had a PCN reaction that required hospitalization: Unk Has patient had a PCN reaction occurring within the last 10 years: No If all of the above answers are "NO", then may proceed with Cephalosporin use.      Past Medical History, Surgical history, Social history, and Family History were reviewed and updated.  Review of Systems: Review of Systems  Constitutional: Negative.   HENT:  Negative.   Eyes: Negative.   Respiratory: Positive for wheezing.   Cardiovascular: Negative.   Gastrointestinal: Positive for abdominal pain.  Endocrine: Negative.   Genitourinary: Negative.    Musculoskeletal: Negative.   Skin: Negative.   Neurological: Negative.   Hematological: Negative.   Psychiatric/Behavioral: Negative.     Physical Exam:  weight is 177 lb (80.3 kg). Her oral temperature is 98 F (36.7 C). Her blood pressure is 222/86 (abnormal) and her pulse is 85. Her respiration is 19 and oxygen saturation is 98%.   Wt Readings from Last 3 Encounters:  12/31/20 177 lb (80.3 kg)  11/19/20 176 lb (79.8 kg)  09/29/20 170 lb 12.8 oz (77.5 kg)    Physical Exam Vitals reviewed.  HENT:     Head: Normocephalic and atraumatic.  Eyes:     Pupils: Pupils are equal, round, and reactive to light.  Cardiovascular:     Rate and Rhythm: Normal rate and regular rhythm.     Heart sounds: Normal heart sounds.  Pulmonary:     Effort: Pulmonary effort is normal.     Breath sounds: Normal breath sounds.  Abdominal:     General: Bowel sounds are normal.     Palpations: Abdomen is soft.  Musculoskeletal:        General: No tenderness or deformity. Normal range of motion.     Cervical back: Normal range of motion.  Lymphadenopathy:     Cervical: No cervical adenopathy.  Skin:    General: Skin is warm and dry.     Findings: No erythema or rash.  Neurological:     Mental Status: She is alert and oriented to person, place, and time.  Psychiatric:        Behavior:  Behavior normal.        Thought Content: Thought content normal.        Judgment: Judgment normal.      Lab Results  Component Value Date   WBC 6.7 12/31/2020   HGB 11.1 (L) 12/31/2020   HCT 34.5 (L) 12/31/2020   MCV 96.4 12/31/2020   PLT 177 12/31/2020     Chemistry      Component Value Date/Time   NA 142 12/31/2020 1502   K 4.8 12/31/2020 1502   CL 108 12/31/2020 1502   CO2 27 12/31/2020 1502  BUN 17 12/31/2020 1502   CREATININE 1.14 (H) 12/31/2020 1502      Component Value Date/Time   CALCIUM 10.0 12/31/2020 1502   ALKPHOS 66 12/31/2020 1502   AST 13 (L) 12/31/2020 1502   ALT 6 12/31/2020 1502   BILITOT 0.6 12/31/2020 1502      Impression and Plan: Monique Lin is a 87 year old African-American female.  She really does not look her age.  She is still quite spunky.  As always, it is somewhat fun talking to both her and her daughter.  They just have a lot of insight into a lot of different issues.  We will set her up with another CT scan when we see her back.  We will get the CT scan when we see her back.  I am just happy that is been a year now that she was diagnosed.  She is doing somewhat better.  She has not bleeding.  Her quality of life is as she likes it.  Hopefully, we will see that she is still responding.  I would have to think that by the increase in hemoglobin that she is responding and that this tumor is shrinking.  I am glad that her quality life is doing well.  This really is the focus of our therapy for her.  We will see back in her back here in another 4 weeks or so.  If the CT scan looks fine, we will not need another one for about 3 or 4 months.   Volanda Napoleon, MD 1/28/20224:09 PM

## 2021-01-03 ENCOUNTER — Telehealth: Payer: Self-pay

## 2021-01-03 LAB — FERRITIN: Ferritin: 117 ng/mL (ref 11–307)

## 2021-01-03 NOTE — Telephone Encounter (Signed)
Called and spoke with pts daughter and she is aware of appts and a sch has been mailed to her as well   Monique Lin

## 2021-01-04 LAB — IRON AND TIBC
Iron: 58 ug/dL (ref 41–142)
Saturation Ratios: 27 % (ref 21–57)
TIBC: 215 ug/dL — ABNORMAL LOW (ref 236–444)
UIBC: 157 ug/dL (ref 120–384)

## 2021-01-31 ENCOUNTER — Emergency Department (HOSPITAL_COMMUNITY): Payer: Medicare Other

## 2021-01-31 ENCOUNTER — Other Ambulatory Visit: Payer: Self-pay

## 2021-01-31 ENCOUNTER — Observation Stay (HOSPITAL_COMMUNITY)
Admission: EM | Admit: 2021-01-31 | Discharge: 2021-02-01 | Disposition: A | Discharge status: Home / Self Care | Payer: Medicare Other | Attending: Emergency Medicine | Admitting: Emergency Medicine

## 2021-01-31 DIAGNOSIS — Z20822 Contact with and (suspected) exposure to covid-19: Secondary | ICD-10-CM | POA: Diagnosis not present

## 2021-01-31 DIAGNOSIS — M25511 Pain in right shoulder: Secondary | ICD-10-CM | POA: Insufficient documentation

## 2021-01-31 DIAGNOSIS — G510 Bell's palsy: Principal | ICD-10-CM

## 2021-01-31 DIAGNOSIS — Z79899 Other long term (current) drug therapy: Secondary | ICD-10-CM | POA: Insufficient documentation

## 2021-01-31 DIAGNOSIS — Z1589 Genetic susceptibility to other disease: Secondary | ICD-10-CM | POA: Insufficient documentation

## 2021-01-31 DIAGNOSIS — M25512 Pain in left shoulder: Secondary | ICD-10-CM | POA: Diagnosis not present

## 2021-01-31 DIAGNOSIS — I10 Essential (primary) hypertension: Secondary | ICD-10-CM | POA: Diagnosis not present

## 2021-01-31 DIAGNOSIS — C49A0 Gastrointestinal stromal tumor, unspecified site: Secondary | ICD-10-CM | POA: Diagnosis present

## 2021-01-31 DIAGNOSIS — R29810 Facial weakness: Secondary | ICD-10-CM | POA: Diagnosis present

## 2021-01-31 DIAGNOSIS — M542 Cervicalgia: Secondary | ICD-10-CM | POA: Diagnosis not present

## 2021-01-31 DIAGNOSIS — E039 Hypothyroidism, unspecified: Secondary | ICD-10-CM | POA: Diagnosis not present

## 2021-01-31 LAB — CBC WITH DIFFERENTIAL/PLATELET
Abs Immature Granulocytes: 0.01 10*3/uL (ref 0.00–0.07)
Basophils Absolute: 0 10*3/uL (ref 0.0–0.1)
Basophils Relative: 1 %
Eosinophils Absolute: 0.1 10*3/uL (ref 0.0–0.5)
Eosinophils Relative: 2 %
HCT: 38.1 % (ref 36.0–46.0)
Hemoglobin: 12.2 g/dL (ref 12.0–15.0)
Immature Granulocytes: 0 %
Lymphocytes Relative: 28 %
Lymphs Abs: 1.3 10*3/uL (ref 0.7–4.0)
MCH: 30.8 pg (ref 26.0–34.0)
MCHC: 32 g/dL (ref 30.0–36.0)
MCV: 96.2 fL (ref 80.0–100.0)
Monocytes Absolute: 0.5 10*3/uL (ref 0.1–1.0)
Monocytes Relative: 10 %
Neutro Abs: 2.8 10*3/uL (ref 1.7–7.7)
Neutrophils Relative %: 59 %
Platelets: 144 10*3/uL — ABNORMAL LOW (ref 150–400)
RBC: 3.96 MIL/uL (ref 3.87–5.11)
RDW: 14.1 % (ref 11.5–15.5)
WBC: 4.6 10*3/uL (ref 4.0–10.5)
nRBC: 0 % (ref 0.0–0.2)

## 2021-01-31 LAB — URINALYSIS, ROUTINE W REFLEX MICROSCOPIC
Bilirubin Urine: NEGATIVE
Glucose, UA: NEGATIVE mg/dL
Hgb urine dipstick: NEGATIVE
Ketones, ur: NEGATIVE mg/dL
Nitrite: NEGATIVE
Protein, ur: NEGATIVE mg/dL
Specific Gravity, Urine: 1.009 (ref 1.005–1.030)
pH: 7 (ref 5.0–8.0)

## 2021-01-31 LAB — COMPREHENSIVE METABOLIC PANEL
ALT: 10 U/L (ref 0–44)
AST: 22 U/L (ref 15–41)
Albumin: 3.3 g/dL — ABNORMAL LOW (ref 3.5–5.0)
Alkaline Phosphatase: 61 U/L (ref 38–126)
Anion gap: 10 (ref 5–15)
BUN: 14 mg/dL (ref 8–23)
CO2: 21 mmol/L — ABNORMAL LOW (ref 22–32)
Calcium: 9.2 mg/dL (ref 8.9–10.3)
Chloride: 107 mmol/L (ref 98–111)
Creatinine, Ser: 1.21 mg/dL — ABNORMAL HIGH (ref 0.44–1.00)
GFR, Estimated: 42 mL/min — ABNORMAL LOW (ref >60)
Glucose, Bld: 140 mg/dL — ABNORMAL HIGH (ref 70–99)
Potassium: 4.9 mmol/L (ref 3.5–5.1)
Sodium: 138 mmol/L (ref 135–145)
Total Bilirubin: 1 mg/dL (ref 0.3–1.2)
Total Protein: 6.7 g/dL (ref 6.5–8.1)

## 2021-01-31 MED ORDER — VALACYCLOVIR HCL 500 MG PO TABS
1000.0000 mg | ORAL_TABLET | Freq: Once | ORAL | Status: AC
  Administered 2021-02-01: 1000 mg via ORAL
  Filled 2021-01-31: qty 2

## 2021-01-31 MED ORDER — PREDNISONE 20 MG PO TABS
60.0000 mg | ORAL_TABLET | Freq: Once | ORAL | Status: AC
  Administered 2021-02-01: 60 mg via ORAL
  Filled 2021-01-31: qty 3

## 2021-01-31 MED ORDER — CARVEDILOL 3.125 MG PO TABS
6.2500 mg | ORAL_TABLET | Freq: Two times a day (BID) | ORAL | Status: DC
  Administered 2021-01-31 – 2021-02-01 (×3): 6.25 mg via ORAL
  Filled 2021-01-31 (×3): qty 2

## 2021-01-31 MED ORDER — LABETALOL HCL 5 MG/ML IV SOLN
10.0000 mg | INTRAVENOUS | Status: DC | PRN
  Filled 2021-01-31 (×2): qty 4

## 2021-01-31 MED ORDER — HYDRALAZINE HCL 20 MG/ML IJ SOLN: 10.0000 mg | Freq: Once | INTRAMUSCULAR | Status: DC

## 2021-01-31 MED ORDER — HYDRALAZINE HCL 20 MG/ML IJ SOLN
20.0000 mg | Freq: Once | INTRAMUSCULAR | Status: AC
  Administered 2021-01-31: 20 mg via INTRAMUSCULAR
  Filled 2021-01-31: qty 1

## 2021-01-31 NOTE — ED Triage Notes (Addendum)
Pt came by EMS due to left sided facial droop and left sided numbness. Pt states her dr stopped her losartan and that is why her BP is up. Pt is axox4. Pts speech is clear. LSN 0700

## 2021-01-31 NOTE — ED Provider Notes (Signed)
Face-to-face evaluation   History: She presents for evaluation of facial asymmetry, difficulty talking, and inability to close her left eye, that was first noticed by the patient's daughter around 1:30 PM.  Daughter had seen the patient briefly at around 8:00 this morning and did not notice anything wrong.  Patient did not have any sensation of anything abnormal.  Patient denies headache.  She reports that her left thigh feels cold and that she can feel things on the left hand as well as on the right.  Physical exam: Alert elderly female.  No dysarthria.  She has facial asymmetry and difficulty closing her left eye.  Left cheek has decreased sensation as compared to right.  Normal grip strength bilaterally.  Decreased sensation fingers left hand as compared to right.  Mild decreased light touch sensation of the left leg as compared to the right.    Patient Vitals for the past 24 hrs:  BP Temp Temp src Pulse Resp SpO2  01/31/21 2300 135/77 -- -- 91 19 99 %  01/31/21 2230 139/68 -- -- 95 13 99 %  01/31/21 2215 -- -- -- 97 19 99 %  01/31/21 2200 (!) 173/77 -- -- (!) 102 20 98 %  01/31/21 2145 -- -- -- (!) 101 (!) 22 99 %  01/31/21 2130 (!) 179/84 -- -- (!) 103 (!) 22 99 %  01/31/21 2115 -- -- -- (!) 106 (!) 25 99 %  01/31/21 2100 (!) 193/83 -- -- (!) 105 (!) 22 99 %  01/31/21 2045 -- -- -- (!) 102 (!) 22 99 %  01/31/21 2030 (!) 166/85 -- -- (!) 102 (!) 25 98 %  01/31/21 2015 -- -- -- 100 10 99 %  01/31/21 2000 (!) 192/83 -- -- (!) 101 10 99 %  01/31/21 1930 (!) 199/86 -- -- (!) 102 14 100 %  01/31/21 1915 (!) 202/91 -- -- (!) 105 (!) 28 99 %  01/31/21 1800 (!) 183/70 -- -- (!) 101 (!) 21 98 %  01/31/21 1745 (!) 178/72 -- -- 98 (!) 22 98 %  01/31/21 1730 (!) 188/94 -- -- 98 (!) 25 98 %  01/31/21 1715 (!) 175/102 -- -- 92 (!) 23 98 %  01/31/21 1700 (!) 208/89 -- -- 83 16 98 %  01/31/21 1645 (!) 228/114 -- -- 88 18 100 %  01/31/21 1615 (!) 219/110 -- -- 82 20 100 %  01/31/21 1600 -- 97.7  F (36.5 C) Oral 87 16 99 %  01/31/21 1545 -- -- -- 86 19 98 %  01/31/21 1530 (!) 217/159 -- -- 90 19 97 %    EKG Interpretation  Date/Time:  Monday January 31 2021 15:28:17 EST Ventricular Rate:  90 PR Interval:    QRS Duration: 104 QT Interval:  342 QTC Calculation: 419 R Axis:   41 Text Interpretation: Sinus rhythm Short PR interval since last tracing no significant change Confirmed by Daleen Bo [phone]) on 01/31/2021 8:31:15 PM         8:34 PM Reevaluation with update and discussion. After initial assessment and treatment, an updated evaluation reveals no change in status at this time.  I discussed case with the neuro hospitalist who will evaluate the patient in the ED for assistance with recommendations. Daleen Bo   Medical Decision Making:   Clinical Laboratory Tests Ordered, included CBC, Metabolic panel and Urinalysis. Review indicates normal except CO2 slightly low, glucose high, creatinine high, albumin low, GFR low. Radiologic Tests Ordered, included CT head, MRI  brain.  I independently Visualized: Radiograph images, which show no acute abnormalities  Cardiac Monitor Tracing which shows normal sinus rhythm   Critical Interventions-clinical evaluation, laboratory testing, CT head, MRI brain, laboratory testing, observation and reassess  Neuro hospitalist consultation, he saw the patient, and feels that she has Bell's palsy and left-sided headache secondary to that.  He feels this could be aggravated by high blood pressure, or any of the other things that can typically cause Bell's palsy.  After These Interventions, the Patient was reevaluated and was found with persistent discomfort including left-sided headache, facial droop and a sensation of numbness on the side of her body.  Seen by neurologist, who feels that she has Bell's palsy and recommend symptomatic care for that.  Blood pressure improved with treatment.  She may require medication management for high  blood pressure, while taking usual home medications  CRITICAL CARE-yes Performed by: Daleen Bo  Nursing Notes Reviewed/ Care Coordinated Applicable Imaging Reviewed Interpretation of Laboratory Data incorporated into ED treatment  .Critical Care Performed by: Daleen Bo, MD Authorized by: Daleen Bo, MD   Critical care provider statement:    Critical care time (minutes):  35   Critical care start time:  01/31/2021 5:32 PM   Critical care end time:  01/31/2021 11:33 PM   Critical care time was exclusive of:  Separately billable procedures and treating other patients   Critical care was necessary to treat or prevent imminent or life-threatening deterioration of the following conditions:  CNS failure or compromise   Critical care was time spent personally by me on the following activities:  Blood draw for specimens, development of treatment plan with patient or surrogate, discussions with consultants, evaluation of patient's response to treatment, examination of patient, obtaining history from patient or surrogate, ordering and performing treatments and interventions, ordering and review of laboratory studies, pulse oximetry, re-evaluation of patient's condition, review of old charts and ordering and review of radiographic studies     11:33 PM-Consult complete with hospitalist. Patient case explained and discussed.  She agrees to admit patient for further evaluation and treatment. Call ended at 11:59 PM   Medical screening examination/treatment/procedure(s) were conducted as a shared visit with non-physician practitioner(s) and myself.  I personally evaluated the patient during the encounter    Daleen Bo, MD 02/01/21 0000

## 2021-01-31 NOTE — ED Notes (Signed)
purewick placed on pt.

## 2021-01-31 NOTE — ED Provider Notes (Signed)
[facility] EMERGENCY DEPARTMENT Provider Note   CSN: [id number] Arrival date & time: 01/31/21  1525     History Chief Complaint  Patient presents with  . Facial Droop    Monique Lin is a 87 y.o. female with PMHx HTN, hypothyroidism, and gastrointestinal stromal tumor (followed by oncology) who presents to the ED today via EMS with complaint of left sided facial droop with LKN around 7 AM this morning. Pt reports she woke up around 7 AM and did not have any symptoms; she went back to sleep however when she woke up around 2 hours later she noticed a feeling of "numbness" to her entire left face. She states she felt like her eye felt heavy and that something might be in the eye as she noticed that she could not close it properly. Pt states her daughter came over and saw that her face was drooping. Her daughter then checked her blood pressure and noted it to be significantly elevated; pt does believe she was recently taken off of her Losartan however she is unsure why. Pt did try to drink something and states it came out of the left side of her mouth. She also felt like her speech was off however attributes this to a feeling of numbness on her tongue. No history of stroke. No other complaints at this time.   The history is provided by the patient, medical records and the EMS personnel.       Past Medical History:  Diagnosis Date  . Anemia   . Bronchitis   . 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 Hypertension   . Hypothyroidism   . Leg pain   . Stomach tumor (benign)     Patient Active Problem List   Diagnosis Date Noted  . Gastrointestinal stromal tumor (GIST) associated with mutation in KIT gene ([place]) 03/10/2020  . Pressure injury of skin 01/14/2020  . Palliative care by specialist   . Goals of care, counseling/discussion   . Transaminitis   . Acute gallstone pancreatitis 01/09/2020  . Acute renal failure  (ARF) ([place]) 01/09/2020  . Essential hypertension 01/09/2020  . Hypothyroidism 01/09/2020  . Acute GI bleeding 12/11/2019  . CAP (community acquired pneumonia)   . Empyema ([place])   . Pleural effusion, left   . Left lower lobe pneumonia 04/01/2018  . Bronchitis     Past Surgical History:  Procedure Laterality Date  . BIOPSY  01/12/2020   Procedure: BIOPSY;  Surgeon: Ronald Lobo, MD;  Location: [facility];  Service: Endoscopy;;  . ESOPHAGOGASTRODUODENOSCOPY (EGD) WITH PROPOFOL N/A 01/12/2020   Procedure: ESOPHAGOGASTRODUODENOSCOPY (EGD) WITH PROPOFOL;  Surgeon: Ronald Lobo, MD;  Location: [facility];  Service: Endoscopy;  Laterality: N/A;  . ESOPHAGOGASTRODUODENOSCOPY (EGD) WITH PROPOFOL N/A 01/15/2020   Procedure: ESOPHAGOGASTRODUODENOSCOPY (EGD) WITH PROPOFOL;  Surgeon: Arta Silence, MD;  Location: [facility];  Service: Endoscopy;  Laterality: N/A;  . EUS N/A 01/15/2020   Procedure: UPPER ENDOSCOPIC ULTRASOUND (EUS) RADIAL;  Surgeon: Arta Silence, MD;  Location: [facility];  Service: Endoscopy;  Laterality: N/A;  . FINE NEEDLE ASPIRATION  01/15/2020   Procedure: FINE NEEDLE ASPIRATION (FNA) LINEAR;  Surgeon: Arta Silence, MD;  Location: [facility] ENDOSCOPY;  Service: Endoscopy;;  . IR CATHETER TUBE CHANGE  04/14/2018  . IR THORACENTESIS ASP PLEURAL SPACE W/IMG GUIDE  04/05/2018  . NASAL SINUS SURGERY    . stomach tumor removal       OB History   No  obstetric history on file.     Family History  Family history unknown: Yes    Social History   Tobacco Use  . Smoking status: Never Smoker  . Smokeless tobacco: Never Used  Vaping Use  . Vaping Use: Never used  Substance Use Topics  . Alcohol use: No  . Drug use: No    Home Medications Prior to Admission medications   Medication Sig Start Date End Date Taking? Authorizing Provider  acetaminophen (TYLENOL) 650 MG CR tablet Take 650 mg by mouth every 8 (eight) hours as needed for pain.    [provider]   albuterol (PROVENTIL) (2.5 MG/3ML) 0.083% nebulizer solution Take 2.5 mg by nebulization every 6 (six) hours as needed for wheezing.    [provider]  albuterol (VENTOLIN HFA) 108 (90 Base) MCG/ACT inhaler Inhale 1 puff into the lungs every 6 (six) hours as needed for wheezing or shortness of breath.    [provider]  alum & mag hydroxide-simeth (MAALOX/MYLANTA) 200-200-20 MG/5ML suspension Take 30 mLs by mouth every 6 (six) hours as needed for indigestion, heartburn or flatulence. 12/22/19   [name], MD  atorvastatin (LIPITOR) 20 MG tablet Take 20 mg by mouth daily.    [provider]  bisacodyl (DULCOLAX) 5 MG EC tablet Take 1 tablet (5 mg total) by mouth daily as needed for moderate constipation. Patient not taking: Reported on 05/04/2020 12/22/19   [name], MD  carvedilol (COREG) 6.25 MG tablet Take 1 tablet (6.25 mg total) by mouth 2 (two) times daily with a meal. 01/30/20   Ennever, Rudell Cobb, MD  Cholecalciferol (VITAMIN D-3) 125 MCG (5000 UT) TABS Take 5,000 Units by mouth daily.     [provider]  Cyanocobalamin (VITAMIN B-12 PO) Take 500 mcg by mouth daily.     [provider]  fexofenadine (ALLEGRA) 60 MG tablet Take 60 mg by mouth daily.    [provider]  fluticasone (FLONASE) 50 MCG/ACT nasal spray Place 2 sprays into both nostrils daily as needed for allergies or rhinitis.  Patient not taking: Reported on 09/29/2020    [provider]  folic acid (FOLVITE) 497 MCG tablet Take 800 mcg by mouth daily.     [provider]  gabapentin (NEURONTIN) 300 MG capsule Take 1 capsule (300 mg total) by mouth 2 (two) times daily. 01/16/20   Mercy Riding, MD  imatinib (GLEEVEC) 400 MG tablet Take 1 tablet (400 mg total) by mouth daily. Take with meals and large glass of water.Caution:Chemotherapy 09/22/20   Volanda Napoleon, MD  levothyroxine (SYNTHROID, LEVOTHROID) 50 MCG tablet Take 50 mcg by mouth daily.     [provider]  ondansetron (ZOFRAN) 8 MG tablet Take 1 tablet (8 mg total) by mouth every 8 (eight) hours as needed for nausea or vomiting. 03/15/20   Volanda Napoleon, MD  pantoprazole (PROTONIX) 40 MG tablet TAKE 1 TABLET(40 MG) BY MOUTH TWICE DAILY 09/22/20   Volanda Napoleon, MD  potassium chloride SA (KLOR-CON) 20 MEQ tablet TAKE 1 TABLET(20 MEQ) BY MOUTH DAILY 12/24/20   Volanda Napoleon, MD  trolamine salicylate (ASPERCREME) 10 % cream Apply 1 application topically as needed for muscle pain.    [provider]  vitamin E 180 MG (400 UNITS) capsule Take 400 Units by mouth daily.    [provider]    Allergies    Codeine and Penicillins  Review of Systems   Review of Systems  Constitutional: Negative  for chills and fever.  Neurological: Positive for facial asymmetry and numbness. Negative for syncope, weakness and headaches.  All other systems reviewed and are negative.   Physical Exam Updated Vital Signs BP (!) 219/110   Pulse 82   Temp 97.7 F (36.5 C) (Oral)   Resp 20   SpO2 100%   Physical Exam Vitals and nursing note reviewed.  Constitutional:      Appearance: She is not ill-appearing.  HENT:     Head: Normocephalic and atraumatic.     Left Ear: Tympanic membrane normal.  Eyes:     Conjunctiva/sclera: Conjunctivae normal.  Cardiovascular:     Rate and Rhythm: Normal rate and regular rhythm.  Pulmonary:     Effort: Pulmonary effort is normal.     Breath sounds: Normal breath sounds.  Abdominal:     Palpations: Abdomen is soft.     Tenderness: There is no abdominal tenderness.  Musculoskeletal:     Cervical back: Neck supple.  Skin:    General: Skin is warm and dry.  Neurological:     Mental Status: She is alert.     Comments: Alert and oriented to self, place, time and event.   Speech is fluent, clear without dysarthria or dysphasia.   Strength 5/5 in upper/lower extremities  Sensation intact in upper/lower extremities    Negative Romberg. No pronator drift.  Normal finger-to-nose and feet tapping.  CN I not tested  CN II grossly intact visual fields bilaterally. Did not visualize posterior eye.   CN III, IV, VI PERRLA and EOMs intact bilaterally  Diminished touch to entirety of left face Unable to lift brow on left side. Unable to close eyelid on left side.  CN VIII not tested  CN IX, X no uvula deviation, symmetric rise of soft palate  CN XI 5/5 SCM and trapezius strength bilaterally  CN XII Midline tongue protrusion, symmetric L/R movements      ED Results / Procedures / Treatments   Labs (all labs ordered are listed, but only abnormal results are displayed) Labs Reviewed  COMPREHENSIVE METABOLIC PANEL - Abnormal; Notable for the following components:      Result Value   CO2 21 (*)    Glucose, Bld 140 (*)    Creatinine, Ser 1.21 (*)    Albumin 3.3 (*)    GFR, Estimated 42 (*)    All other components within normal limits  CBC WITH DIFFERENTIAL/PLATELET - Abnormal; Notable for the following components:   Platelets 144 (*)    All other components within normal limits  URINALYSIS, ROUTINE W REFLEX MICROSCOPIC - Abnormal; Notable for the following components:   Color, Urine STRAW (*)    Leukocytes,Ua SMALL (*)    Bacteria, UA RARE (*)    All other components within normal limits    EKG None  Radiology CT Head Wo Contrast  Result Date: 01/31/2021 CLINICAL DATA:  Facial droop EXAM: CT HEAD WITHOUT CONTRAST TECHNIQUE: Contiguous axial images were obtained from the base of the skull through the vertex without intravenous contrast. COMPARISON:  04/01/2018 FINDINGS: Brain: There is atrophy and chronic small vessel disease changes. No acute intracranial abnormality. Specifically, no hemorrhage, hydrocephalus, mass lesion, acute infarction, or significant intracranial injury. Vascular: No hyperdense vessel or unexpected calcification. Skull: No acute calvarial abnormality. Sinuses/Orbits: Near  complete opacification of the left maxillary sinus. Mucosal thickening throughout the paranasal sinuses. No acute findings. Mastoid air cells clear. Other: None IMPRESSION: Atrophy, chronic microvascular disease. No acute intracranial abnormality. Chronic  sinusitis. Electronically Signed   By: Rolm Baptise M.D.   On: 01/31/2021 16:38    Procedures Procedures   Medications Ordered in ED Medications  hydrALAZINE (APRESOLINE) injection 20 mg (20 mg Intramuscular Given 01/31/21 1642)    ED Course  I have reviewed the triage vital signs and the nursing notes.  Pertinent labs & imaging results that were available during my care of the patient were reviewed by me and considered in my medical decision making (see chart for details).    MDM Rules/Calculators/A&P                          87 year old female presenting to the ED today via EMS with complaint of left sided facial droop; LKN 7 AM this morning. No other focal  Neuro deficits per EMS and pt out of the window for tPA without signs of LVO; no code stroke called. On arrival BP significantly elevated at 219/110; pt reports recently being taken off of her Losartan. Per chart review blood pressure chronically in the 419F systolic. On exam pt is noted to have left sided facial droop; her left eye is watering as she is unable to fully close her left eye. She is also unable to lift her left eyebrow. No weakness appreciated to left arm and leg and no other focal neuro deficits however pt does have diminished sensation to her face. Exam more consistent with bells palsy at this time; TMs clear and no recent viral symptoms described by patient however given her elevated BP with new facial droop will plan for CT head. Pt is no longer in the stroke window and therefore code stroke not initially called. Will provide medication to help with BP and check labs  IM BP medications provided as pt did not have an IV in place with improvement in BP to 178/72. Will  continue to monitor.  CT head negative CBC without leukocytosis. Hgb stable.  CMP with glucose 140 and bicarb 21. No gap. Creatinine stable at 1.21  Pt evaluated by attending physician after CT scan; pt is poor historian however now is complaining of some left sided upper and lower extremity involvement and therefore MRI brain will be obtained. If no acute findings for stroke will discharge home with PCP follow up. At shift change case signed out to Dr. Eulis Foster who is aware of patient and will dispo accordingly.   This note was prepared using Dragon voice recognition software and may include unintentional dictation errors due to the inherent limitations of voice recognition software.   Final Clinical Impression(s) / ED Diagnoses Final diagnoses:  Facial droop    Rx / DC Orders ED Discharge Orders    None       Eustaquio Maize, PA-C 01/31/21 1843    Daleen Bo, MD 02/01/21 [phone]

## 2021-02-01 DIAGNOSIS — G510 Bell's palsy: Secondary | ICD-10-CM

## 2021-02-01 DIAGNOSIS — R29810 Facial weakness: Secondary | ICD-10-CM | POA: Diagnosis not present

## 2021-02-01 DIAGNOSIS — E039 Hypothyroidism, unspecified: Secondary | ICD-10-CM

## 2021-02-01 DIAGNOSIS — I10 Essential (primary) hypertension: Secondary | ICD-10-CM | POA: Diagnosis not present

## 2021-02-01 DIAGNOSIS — C49A0 Gastrointestinal stromal tumor, unspecified site: Secondary | ICD-10-CM | POA: Diagnosis not present

## 2021-02-01 LAB — BASIC METABOLIC PANEL
Anion gap: 10 (ref 5–15)
BUN: 13 mg/dL (ref 8–23)
CO2: 22 mmol/L (ref 22–32)
Calcium: 9.3 mg/dL (ref 8.9–10.3)
Chloride: 106 mmol/L (ref 98–111)
Creatinine, Ser: 1.23 mg/dL — ABNORMAL HIGH (ref 0.44–1.00)
GFR, Estimated: 41 mL/min — ABNORMAL LOW (ref >60)
Glucose, Bld: 130 mg/dL — ABNORMAL HIGH (ref 70–99)
Potassium: 4.5 mmol/L (ref 3.5–5.1)
Sodium: 138 mmol/L (ref 135–145)

## 2021-02-01 LAB — CBC
HCT: 34.8 % — ABNORMAL LOW (ref 36.0–46.0)
Hemoglobin: 11.5 g/dL — ABNORMAL LOW (ref 12.0–15.0)
MCH: 31.3 pg (ref 26.0–34.0)
MCHC: 33 g/dL (ref 30.0–36.0)
MCV: 94.8 fL (ref 80.0–100.0)
Platelets: 185 10*3/uL (ref 150–400)
RBC: 3.67 MIL/uL — ABNORMAL LOW (ref 3.87–5.11)
RDW: 14.3 % (ref 11.5–15.5)
WBC: 6.7 10*3/uL (ref 4.0–10.5)
nRBC: 0 % (ref 0.0–0.2)

## 2021-02-01 LAB — SARS CORONAVIRUS 2 (TAT 6-24 HRS): SARS Coronavirus 2: NEGATIVE

## 2021-02-01 MED ORDER — VITAMIN D 25 MCG (1000 UNIT) PO TABS
5000.0000 [IU] | ORAL_TABLET | Freq: Every day | ORAL | Status: DC
  Administered 2021-02-01: 5000 [IU] via ORAL
  Filled 2021-02-01: qty 5

## 2021-02-01 MED ORDER — ATORVASTATIN CALCIUM 10 MG PO TABS
20.0000 mg | ORAL_TABLET | Freq: Every day | ORAL | Status: DC
  Administered 2021-02-01: 20 mg via ORAL
  Filled 2021-02-01: qty 2

## 2021-02-01 MED ORDER — IMATINIB MESYLATE 400 MG PO TABS: 200.0000 mg | ORAL_TABLET | ORAL | Status: DC

## 2021-02-01 MED ORDER — VALACYCLOVIR HCL 1 G PO TABS: 1000.0000 mg | ORAL_TABLET | Freq: Three times a day (TID) | ORAL | 0 refills | Status: AC

## 2021-02-01 MED ORDER — POLYVINYL ALCOHOL 1.4 % OP SOLN
1.0000 [drp] | Freq: Three times a day (TID) | OPHTHALMIC | Status: DC
  Administered 2021-02-01 (×2): 1 [drp] via OPHTHALMIC
  Filled 2021-02-01: qty 15

## 2021-02-01 MED ORDER — VALACYCLOVIR HCL 500 MG PO TABS
1000.0000 mg | ORAL_TABLET | Freq: Three times a day (TID) | ORAL | Status: DC
  Administered 2021-02-01 (×2): 1000 mg via ORAL
  Filled 2021-02-01 (×2): qty 2

## 2021-02-01 MED ORDER — ONDANSETRON HCL 4 MG/2ML IJ SOLN: 4.0000 mg | Freq: Four times a day (QID) | INTRAMUSCULAR | Status: DC | PRN

## 2021-02-01 MED ORDER — FOLIC ACID 1 MG PO TABS
1.0000 mg | ORAL_TABLET | Freq: Every day | ORAL | Status: DC
  Administered 2021-02-01: 1 mg via ORAL
  Filled 2021-02-01: qty 1

## 2021-02-01 MED ORDER — PANTOPRAZOLE SODIUM 40 MG PO TBEC
40.0000 mg | DELAYED_RELEASE_TABLET | Freq: Two times a day (BID) | ORAL | Status: DC
  Administered 2021-02-01: 40 mg via ORAL
  Filled 2021-02-01: qty 1

## 2021-02-01 MED ORDER — ONDANSETRON HCL 4 MG/2ML IJ SOLN
INTRAMUSCULAR | Status: AC
  Administered 2021-02-01: 4 mg via INTRAVENOUS
  Filled 2021-02-01: qty 2

## 2021-02-01 MED ORDER — POTASSIUM CHLORIDE CRYS ER 20 MEQ PO TBCR
20.0000 meq | EXTENDED_RELEASE_TABLET | Freq: Every day | ORAL | Status: DC
  Administered 2021-02-01: 20 meq via ORAL
  Filled 2021-02-01: qty 1

## 2021-02-01 MED ORDER — LEVOTHYROXINE SODIUM 50 MCG PO TABS
50.0000 ug | ORAL_TABLET | Freq: Every day | ORAL | Status: DC
  Administered 2021-02-01: 50 ug via ORAL
  Filled 2021-02-01: qty 1

## 2021-02-01 MED ORDER — VITAMIN B-12 1000 MCG PO TABS
500.0000 ug | ORAL_TABLET | Freq: Every day | ORAL | Status: DC
  Administered 2021-02-01: 500 ug via ORAL
  Filled 2021-02-01: qty 1

## 2021-02-01 MED ORDER — VITAMIN E 45 MG (100 UNIT) PO CAPS
400.0000 [IU] | ORAL_CAPSULE | Freq: Every day | ORAL | Status: DC
  Administered 2021-02-01: 400 [IU] via ORAL
  Filled 2021-02-01: qty 4

## 2021-02-01 MED ORDER — ARTIFICIAL TEARS OPHTHALMIC OINT: TOPICAL_OINTMENT | Freq: Every day | OPHTHALMIC | 1 refills | Status: AC

## 2021-02-01 MED ORDER — LOSARTAN POTASSIUM 25 MG PO TABS: 25.0000 mg | ORAL_TABLET | Freq: Every day | ORAL | 0 refills | Status: DC

## 2021-02-01 MED ORDER — PREDNISONE 20 MG PO TABS: 60.0000 mg | ORAL_TABLET | Freq: Every day | ORAL | 0 refills | Status: AC

## 2021-02-01 MED ORDER — LORATADINE 10 MG PO TABS
10.0000 mg | ORAL_TABLET | Freq: Every day | ORAL | Status: DC
  Administered 2021-02-01: 10 mg via ORAL
  Filled 2021-02-01: qty 1

## 2021-02-01 MED ORDER — LIDOCAINE 5 % EX PTCH
1.0000 | MEDICATED_PATCH | Freq: Every day | CUTANEOUS | Status: DC
  Administered 2021-02-01: 1 via TRANSDERMAL
  Filled 2021-02-01: qty 1

## 2021-02-01 MED ORDER — BISACODYL 5 MG PO TBEC: 5.0000 mg | DELAYED_RELEASE_TABLET | Freq: Every day | ORAL | Status: DC | PRN

## 2021-02-01 MED ORDER — ACETAMINOPHEN 325 MG PO TABS
650.0000 mg | ORAL_TABLET | Freq: Four times a day (QID) | ORAL | Status: DC | PRN
  Administered 2021-02-01: 650 mg via ORAL

## 2021-02-01 MED ORDER — GABAPENTIN 300 MG PO CAPS
300.0000 mg | ORAL_CAPSULE | Freq: Two times a day (BID) | ORAL | Status: DC
Start: 2021-02-01 — End: 2021-02-02
  Administered 2021-02-01: 300 mg via ORAL
  Filled 2021-02-01: qty 1

## 2021-02-01 MED ORDER — HYPROMELLOSE (GONIOSCOPIC) 2.5 % OP SOLN: 1.0000 [drp] | Freq: Three times a day (TID) | OPHTHALMIC | 12 refills | Status: AC

## 2021-02-01 MED ORDER — PREDNISONE 20 MG PO TABS: 60.0000 mg | ORAL_TABLET | Freq: Every day | ORAL | Status: DC

## 2021-02-01 NOTE — Discharge Summary (Addendum)
Physician Discharge Summary  DESTA BUJAK CLE:[id number] DOB: 05/07/1927 DOA: 01/31/2021  PCP: [name], MD  Admit date: 01/31/2021 Discharge date: 02/01/2021  Time spent: 40 minutes  Recommendations for Outpatient Follow-up:  1. Follow outpatient CBC/CMP 2. Follow blood pressure outpatient - she's going to restart losartan 25 mg daily in addition to carvedilol (follow with Dr. [name] outpatient)  3. Follow with neurology outpatient 4. 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  History of present illness:  Monique Lin is a 87 y.o. female with medical history significant for GIST s/p resection in 2011 with recurrence, HTN, hypothyroidism and emphysema who presents with left facial droop.  Patient woke up this morning and was well.  However later in the afternoon her daughter noticed left facial drooping.  Patient noticed it when she was drinking and fluid came out of the left side of her mouth.  She has difficulty closing her left eye.  Also notes worsening headache, neck pain and bilateral shoulder pain with left being the worst.  States those things are chronic but is worse today.  Also having some photosensitivity.  She denies any focal extremity weakness.  Denies nausea or vomiting but had an episode of emesis during my evaluation.  She has chronic abdominal pain due to her GIST tumor.  ED Course: She was afebrile, tachycardic and initially hypertensive up to 217/159.  Down to about 150/80 following IM hydralazine. CBC shows no leukocytosis or anemia.  Creatinine elevated 1.21 which appears to be her baseline.  Glucose of 140.  CT head and MRI brain showed no acute intracranial  abnormalities.  There is chronic para sinusitis.  She was evaluated by neurology and thought that her symptoms more closely aligns with that of Bell's palsy given the inability to close her left eye and negative findings on imaging.  Hospitalist was called for admission for observation of Bell's palsy and elevated blood pressure.  She was admitted for bell's palsy.  She's being discharged on 3/1 in stable condition, recommended follow outpatient with PCP and neurology, follow BP outpatient.   See below for additional details.   Hospital Course:  Left facial droop secondary to Bell's palsy - L sided facial droop without forehead sparing - MRI brain without acute intracranial abnormality - d/c with steroids/valtrex  - TID eye drops, ointment QHS - needs to patch at night - neuro feels c/w idiopathic L facial palsy - recommending eye drops, patching eye, blood pressure control  HTN  Continue home Coreg BP improved at time of discharge (fluctuating widely) - daughter spoke with Dr. [name] who recommended restarting losartan 25 mg daily, think this is reasonable - she's asymptomatic, follow outpatient  Continue to follow closely outpatient   Neck Pain  Shoulder: Seems to be chronic - ? Msk/arthritis pain - follow outpatient - seems improved  Recurrent GIST of the stomach  Follows with oncology Dr. Marin Olp  On Gleevec.  No bleeding  continue to follow onc outpt   Hypothyroidism  Continue levothyroxine  Procedures: none  Consultations:  neurology  Discharge Exam: Vitals:   02/01/21 1300 02/01/21 1755  BP: 135/64 (!) 178/76  Pulse: 96 93  Resp: 18 17  Temp:  98.3 F (  36.8 C)  SpO2: 96% 98%   No new complaints Discussed d/c plan with duaghter  General: No acute distress. Cardiovascular: Heart sounds show a regular rate, and rhythm. No gallops or rubs. No murmurs. No JVD. Lungs: Clear to auscultation bilaterally with good air movement. No rales, rhonchi or  wheezes. Abdomen: Soft, nontender, nondistended with normal active bowel sounds. No masses. No hepatosplenomegaly. Neurological: Alert and oriented 3. Right lower extremity weakness due to pain/arthritis (she notes chronic). L facial droop without forehead sparing. Skin: Warm and dry. No rashes or lesions. Extremities: No clubbing or cyanosis. No edema.  Discharge Instructions   Discharge Instructions    Ambulatory referral to Neurology   Complete by: As directed    An appointment is requested in approximately: 2 weeks   Call MD for:  difficulty breathing, headache or visual disturbances   Complete by: As directed    Call MD for:  extreme fatigue   Complete by: As directed    Call MD for:  hives   Complete by: As directed    Call MD for:  persistant dizziness or light-headedness   Complete by: As directed    Call MD for:  persistant nausea and vomiting   Complete by: As directed    Call MD for:  redness, tenderness, or signs of infection (pain, swelling, redness, odor or green/yellow discharge around incision site)   Complete by: As directed    Call MD for:  severe uncontrolled pain   Complete by: As directed    Call MD for:  temperature >100.4   Complete by: As directed    Diet - low sodium heart healthy   Complete by: As directed    Discharge instructions   Complete by: As directed    You were seen for left sided facial droop.  This is likely bells palsy.  We've started you on therapy with steroids and antivirals.  Your MRI was negative for a stroke.  We'll send you home with steroids and antivirals.  Continue care to the left eye.  Use eye drops during the day and ointment overnight with patch to protect this eye when you're sleeping.   This may have been worsened by your blood pressure.  Please follow this closely with your PCP.  Follow up with neurology in a few weeks.  Follow up with your PCP within 1 week.  Return for new, recurrent, or worsening symptoms.  Please ask  your PCP to request records from this hospitalization so they know what was done and what the next steps will be.   Increase activity slowly   Complete by: As directed      Allergies as of 02/01/2021      Reactions   Codeine Other (See Comments)   Stomach cramps   Penicillins Other (See Comments)   "Bad stomach cramps" Has patient had a PCN reaction causing immediate rash, facial/tongue/throat swelling, SOB or lightheadedness with hypotension: Unk Has patient had a PCN reaction causing severe rash involving mucus membranes or skin necrosis: Unk Has patient had a PCN reaction that required hospitalization: Unk Has patient had a PCN reaction occurring within the last 10 years: No If all of the above answers are "NO", then may proceed with Cephalosporin use.      Medication List    TAKE these medications   acetaminophen 650 MG CR tablet Commonly known as: TYLENOL Take 650 mg by mouth every 8 (eight) hours as needed for pain.   albuterol 108 (90 Base)  MCG/ACT inhaler Commonly known as: VENTOLIN HFA Inhale 1 puff into the lungs every 6 (six) hours as needed for wheezing or shortness of breath.   albuterol (2.5 MG/3ML) 0.083% nebulizer solution Commonly known as: PROVENTIL Take 2.5 mg by nebulization every 6 (six) hours as needed for wheezing.   Allegra Allergy 180 MG tablet Generic drug: fexofenadine Take 180 mg by mouth in the morning.   alum & mag hydroxide-simeth 200-200-20 MG/5ML suspension Commonly known as: MAALOX/MYLANTA Take 30 mLs by mouth every 6 (six) hours as needed for indigestion, heartburn or flatulence.   artificial tears Oint ophthalmic ointment Commonly known as: LACRILUBE Place into the left eye at bedtime.   atorvastatin 20 MG tablet Commonly known as: LIPITOR Take 20 mg by mouth daily.   bisacodyl 5 MG EC tablet Commonly known as: DULCOLAX Take 1 tablet (5 mg total) by mouth daily as needed for moderate constipation.   carvedilol 6.25 MG  tablet Commonly known as: COREG Take 1 tablet (6.25 mg total) by mouth 2 (two) times daily with a meal.   folic acid 185 MCG tablet Commonly known as: FOLVITE Take 800 mcg by mouth daily.   gabapentin 300 MG capsule Commonly known as: NEURONTIN Take 1 capsule (300 mg total) by mouth 2 (two) times daily.   hydroxypropyl methylcellulose / hypromellose 2.5 % ophthalmic solution Commonly known as: ISOPTO TEARS / GONIOVISC Place 1 drop into the left eye 3 (three) times daily.   imatinib 400 MG tablet Commonly known as: GLEEVEC Take 1 tablet (400 mg total) by mouth daily. Take with meals and large glass of water.Caution:Chemotherapy What changed:   how much to take  when to take this  additional instructions   losartan 25 MG tablet Commonly known as: Cozaar Take 1 tablet (25 mg total) by mouth daily.   ondansetron 8 MG tablet Commonly known as: ZOFRAN Take 1 tablet (8 mg total) by mouth every 8 (eight) hours as needed for nausea or vomiting.   pantoprazole 40 MG tablet Commonly known as: PROTONIX TAKE 1 TABLET(40 MG) BY MOUTH TWICE DAILY What changed: See the new instructions.   potassium chloride SA 20 MEQ tablet Commonly known as: KLOR-CON TAKE 1 TABLET(20 MEQ) BY MOUTH DAILY What changed: See the new instructions.   predniSONE 20 MG tablet Commonly known as: DELTASONE Take 3 tablets (60 mg total) by mouth daily with breakfast for 7 days. Start taking on: February 02, 2021   Synthroid 50 MCG tablet Generic drug: levothyroxine Take 50 mcg by mouth daily before breakfast.   trolamine salicylate 10 % cream Commonly known as: ASPERCREME Apply 1 application topically as needed for muscle pain.   valACYclovir 1000 MG tablet Commonly known as: VALTREX Take 1 tablet (1,000 mg total) by mouth 3 (three) times daily for 7 days.   VITAMIN B-12 PO Take 500 mcg by mouth daily.   Vitamin D-3 125 MCG (5000 UT) Tabs Take 5,000 Units by mouth daily.   vitamin E 180 MG (400  UNITS) capsule Take 400 Units by mouth daily.      Allergies  Allergen Reactions  . Codeine Other (See Comments)    Stomach cramps  . Penicillins Other (See Comments)    "Bad stomach cramps" Has patient had a PCN reaction causing immediate rash, facial/tongue/throat swelling, SOB or lightheadedness with hypotension: Unk Has patient had a PCN reaction causing severe rash involving mucus membranes or skin necrosis: Unk Has patient had a PCN reaction that required hospitalization: Unk Has patient had  a PCN reaction occurring within the last 10 years: No If all of the above answers are "NO", then may proceed with Cephalosporin use.      Follow-up Information    [name], MD Follow up.   Specialty: Cardiology Contact information: [place]  [id number] [phone]                The results of significant diagnostics from this hospitalization (including imaging, microbiology, ancillary and laboratory) are listed below for reference.    Significant Diagnostic Studies: CT Head Wo Contrast  Result Date: 01/31/2021 CLINICAL DATA:  Facial droop EXAM: CT HEAD WITHOUT CONTRAST TECHNIQUE: Contiguous axial images were obtained from the base of the skull through the vertex without intravenous contrast. COMPARISON:  04/01/2018 FINDINGS: Brain: There is atrophy and chronic small vessel disease changes. No acute intracranial abnormality. Specifically, no hemorrhage, hydrocephalus, mass lesion, acute infarction, or significant intracranial injury. Vascular: No hyperdense vessel or unexpected calcification. Skull: No acute calvarial abnormality. Sinuses/Orbits: Near complete opacification of the left maxillary sinus. Mucosal thickening throughout the paranasal sinuses. No acute findings. Mastoid air cells clear. Other: None IMPRESSION: Atrophy, chronic microvascular disease. No acute intracranial abnormality. Chronic sinusitis. Electronically Signed   By:  Rolm Baptise M.D.   On: 01/31/2021 16:38   MR BRAIN WO CONTRAST  Result Date: 01/31/2021 CLINICAL DATA:  Initial evaluation for left-sided facial droop with numbness. Day. EXAM: MRI HEAD WITHOUT CONTRAST TECHNIQUE: Multiplanar, multiecho pulse sequences of the brain and surrounding structures were obtained without intravenous contrast. COMPARISON:  Prior head CT from earlier the same FINDINGS: Brain: Examination degraded by motion artifact. Generalized age-related cerebral atrophy. Patchy and confluent T2/FLAIR hyperintensity within the periventricular deep white matter both cerebral hemispheres most consistent with chronic small vessel ischemic disease, moderate in nature. No evidence for acute or subacute infarct. Gray-white matter differentiation maintained. No encephalomalacia to suggest chronic cortical infarction. No evidence for acute or chronic intracranial hemorrhage. No mass lesion, midline shift or mass effect. No hydrocephalus or extra-axial fluid collection. Pituitary gland suprasellar region normal. Midline structures intact. Vascular: Major intracranial vascular flow voids are maintained. Skull and upper cervical spine: Craniocervical junction within normal limits. Multilevel degenerative spondylosis noted throughout the upper cervical spine without high-grade stenosis. Bone marrow signal intensity normal. No scalp soft tissue abnormality. Sinuses/Orbits: Patient status post ocular lens replacement on the right. Globes and orbital soft tissues demonstrate no acute finding. Complete opacification of the left maxillary sinus with chronic mucosal thickening elsewhere throughout the paranasal sinuses. Trace right mastoid effusion, of doubtful significance. Inner ear structures grossly normal. Other: None. IMPRESSION: 1. No acute intracranial abnormality. 2. Age-related cerebral atrophy with moderate chronic small vessel ischemic disease. 3. Chronic paranasal sinus disease, most pronounced within the  left maxillary sinus. Electronically Signed   By: Jeannine Boga M.D.   On: 01/31/2021 19:49    Microbiology: Recent Results (from the past 240 hour(s))  SARS CORONAVIRUS 2 (TAT 6-24 HRS) Nasopharyngeal Nasopharyngeal Swab     Status: None   Collection Time: 01/31/21  1:45 AM   Specimen: Nasopharyngeal Swab  Result Value Ref Range Status   SARS Coronavirus 2 NEGATIVE NEGATIVE Final    Comment: (NOTE) SARS-CoV-2 target nucleic acids are NOT DETECTED.  The SARS-CoV-2 RNA is generally detectable in upper and lower respiratory specimens during the acute phase of infection. Negative results do not preclude SARS-CoV-2 infection, do not rule out co-infections with other pathogens, and should not be used as the sole  basis for treatment or other patient management decisions. Negative results must be combined with clinical observations, patient history, and epidemiological information. The expected result is Negative.  Fact Sheet for Patients: SugarRoll.be  Fact Sheet for Healthcare Providers: https://www.woods-mathews.com/  This test is not yet approved or cleared by the [place] FDA and  has been authorized for detection and/or diagnosis of SARS-CoV-2 by FDA under an Emergency Use Authorization (EUA). This EUA will remain  in effect (meaning this test can be used) for the duration of the COVID-19 declaration under Se ction 564(b)(1) of the Act, 21 U.S.C. section 360bbb-3(b)(1), unless the authorization is terminated or revoked sooner.  Performed at [facility], [place] [address], [place], [place] [address]      Labs: Basic Metabolic Panel: Recent Labs  Lab 01/31/21 1543 02/01/21 0234  NA 138 138  K 4.9 4.5  CL 107 106  CO2 21* 22  GLUCOSE 140* 130*  BUN 14 13  CREATININE 1.21* 1.23*  CALCIUM 9.2 9.3   Liver Function Tests: Recent Labs  Lab 01/31/21 1543  AST 22  ALT 10  ALKPHOS 61  BILITOT 1.0  PROT 6.7   ALBUMIN 3.3*   No results for input(s): LIPASE, AMYLASE in the last 168 hours. No results for input(s): AMMONIA in the last 168 hours. CBC: Recent Labs  Lab 01/31/21 1543 02/01/21 0234  WBC 4.6 6.7  NEUTROABS 2.8  --   HGB 12.2 11.5*  HCT 38.1 34.8*  MCV 96.2 94.8  PLT 144* 185   Cardiac Enzymes: No results for input(s): CKTOTAL, CKMB, CKMBINDEX, TROPONINI in the last 168 hours. BNP: BNP (last 3 results) No results for input(s): BNP in the last 8760 hours.  ProBNP (last 3 results) No results for input(s): PROBNP in the last 8760 hours.  CBG: No results for input(s): GLUCAP in the last 168 hours.     Signed:  Fayrene Helper MD.  [facility] 02/01/2021, 6:14 PM

## 2021-02-01 NOTE — Evaluation (Signed)
Clinical/Bedside Swallow Evaluation Patient Details  Name: Monique Lin MRN: [id number] Date of Birth: 1927-04-01  Today's Date: 02/01/2021 Time: SLP Start Time (ACUTE ONLY): 1505 SLP Stop Time (ACUTE ONLY): 1525 SLP Time Calculation (min) (ACUTE ONLY): 20 min  Past Medical History:  Past Medical History:  Diagnosis Date  . Anemia   . Bronchitis   . 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 Hypertension   . Hypothyroidism   . Leg pain   . Stomach tumor (benign)    Past Surgical History:  Past Surgical History:  Procedure Laterality Date  . BIOPSY  01/12/2020   Procedure: BIOPSY;  Surgeon: Ronald Lobo, MD;  Location: [facility];  Service: Endoscopy;;  . ESOPHAGOGASTRODUODENOSCOPY (EGD) WITH PROPOFOL N/A 01/12/2020   Procedure: ESOPHAGOGASTRODUODENOSCOPY (EGD) WITH PROPOFOL;  Surgeon: Ronald Lobo, MD;  Location: [facility];  Service: Endoscopy;  Laterality: N/A;  . ESOPHAGOGASTRODUODENOSCOPY (EGD) WITH PROPOFOL N/A 01/15/2020   Procedure: ESOPHAGOGASTRODUODENOSCOPY (EGD) WITH PROPOFOL;  Surgeon: Arta Silence, MD;  Location: [facility];  Service: Endoscopy;  Laterality: N/A;  . EUS N/A 01/15/2020   Procedure: UPPER ENDOSCOPIC ULTRASOUND (EUS) RADIAL;  Surgeon: Arta Silence, MD;  Location: [facility];  Service: Endoscopy;  Laterality: N/A;  . FINE NEEDLE ASPIRATION  01/15/2020   Procedure: FINE NEEDLE ASPIRATION (FNA) LINEAR;  Surgeon: Arta Silence, MD;  [facility] ENDOSCOPY;  Service: Endoscopy;;  . IR CATHETER TUBE CHANGE  04/14/2018  . IR THORACENTESIS ASP PLEURAL SPACE W/IMG GUIDE  04/05/2018  . NASAL SINUS SURGERY    . stomach tumor removal     HPI:  Monique Lin is a 87 y.o. female PMHx arthritis in knees/neck/shoulders, GIST s/p resection 2011 w/recurrence, HTN, hypothyroidism and emphysema who presents with left facial droop. Neurological workup including MRI and CT of head unremarkable. Per nursing persistent  facial droop suspected to be due to Bell's Palsy.   Assessment / Plan / Recommendation Clinical Impression  Pt presents with a mild oral dysphagia due to novel left sided facial weakness. Left facial droop persists as well as dereased sensation (suspected Bell's Palsy per RN report) . This allowed for left sided anterior spillage by cup and straw sip. Pt self reported compensatory strategies including placing drink on right side of oral cavity to reduce spillage. Adequate mastication of solids was noted. No overt s/sx of aspiration with any POs. Recommend regular thin liquid diet with meds as tolerated. No further ST needs identified.  SLP Visit Diagnosis: Dysphagia, oral phase (R13.11)    Aspiration Risk  Mild aspiration risk    Diet Recommendation   Regular, thin liquids  Medication Administration: Whole meds with liquid    Other  Recommendations Oral Care Recommendations: Oral care BID   Follow up Recommendations None      Frequency and Duration            Prognosis        Swallow Study   General Date of Onset: 01/31/21 HPI: Monique Lin is a 87 y.o. female PMHx arthritis in knees/neck/shoulders, GIST s/p resection 2011 w/recurrence, HTN, hypothyroidism and emphysema who presents with left facial droop. Neurological workup including MRI and CT of head unremarkable. Per nursing persistent facial droop suspected to be due to Bell's Palsy. Type of Study: Bedside Swallow Evaluation Previous Swallow Assessment: none on file Diet Prior to this Study: NPO Temperature Spikes Noted: No Respiratory Status: Room air History of Recent Intubation: No Behavior/Cognition: Alert;Cooperative;Pleasant mood Oral Cavity Assessment: Within  Functional Limits Oral Care Completed by SLP: No Vision: Functional for self-feeding Self-Feeding Abilities: Able to feed self Patient Positioning: Upright in bed Baseline Vocal Quality: Normal Volitional Swallow: Able to elicit    Oral/Motor/Sensory  Function Overall Oral Motor/Sensory Function: Mild impairment Facial ROM: Reduced left;Suspected CN VII (facial) dysfunction Facial Symmetry: Abnormal symmetry left;Suspected CN VII (facial) dysfunction Facial Strength: Suspected CN VII (facial) dysfunction;Reduced left Facial Sensation: Reduced left;Suspected CN V (Trigeminal) dysfunction Lingual ROM: Within Functional Limits Lingual Symmetry: Within Functional Limits Lingual Strength: Within Functional Limits Lingual Sensation: Within Functional Limits   Ice Chips Ice chips: Not tested (pt declined, does not like cold temperatures)   Thin Liquid Thin Liquid: Impaired Presentation: Cup;Straw Oral Phase Impairments: Reduced labial seal Pharyngeal  Phase Impairments: Suspected delayed Swallow;Multiple swallows    Nectar Thick Nectar Thick Liquid: Not tested   Honey Thick Honey Thick Liquid: Not tested   Puree Puree: Within functional limits Presentation: Self Fed   Solid     Solid: Within functional limits Presentation: [place], CCC-SLP Acute Rehabilitation Services   02/01/2021,3:40 PM

## 2021-02-01 NOTE — ED Notes (Signed)
Dinner Tray Ordered @ 1721.

## 2021-02-01 NOTE — Discharge Instructions (Signed)
 Bell's Palsy, Adult  Bell's palsy is Monique Lin short-term inability to move muscles in Monique Lin part of the face. The inability to move, also called paralysis, results from inflammation or compression of the seventh cranial nerve. This nerve travels along the skull and under the ear to the side of the face. This nerve is responsible for facial movements that include blinking, closing the eyes, smiling, and frowning. What are the causes? The exact cause of this condition is not known. It may be caused by an infection from Monique Lin virus, such as the chickenpox (herpes zoster), Epstein-Barr, or mumps virus. What increases the risk? You are more likely to develop this condition if:  You are pregnant.  You have diabetes.  You have had Monique Lin recent infection in your nose, throat, or airways.  You have Monique Lin weakened body defense system (immune system).  You have had Monique Lin facial injury, such as Monique Lin fracture.  You have Monique Lin family history of Bell's palsy. What are the signs or symptoms? Symptoms of this condition include:  Weakness on one side of the face.  Drooping eyelid and corner of the mouth.  Excessive tearing in one eye.  Difficulty closing the eyelid.  Dry eye.  Drooling.  Dry mouth.  Changes in taste.  Change in facial appearance.  Pain behind one ear.  Ringing in one or both ears.  Sensitivity to sound in one ear.  Facial twitching.  Headache.  Impaired speech.  Dizziness.  Difficulty eating or drinking. Most of the time, only one side of the face is affected. In rare cases, Bell's palsy may affect the whole face. How is this diagnosed? This condition is diagnosed based on:  Your symptoms.  Your medical history.  Monique Lin physical exam. You may also have to see health care providers who specialize in disorders of the nerves (neurologist) or diseases and conditions of the eye (ophthalmologist). You may have tests, such as:  Monique Lin test to check for nerve damage (electromyogram).  Imaging  studies, such as Monique Lin CT scan or an MRI.  Blood tests. How is this treated? This condition affects every person differently. Sometimes symptoms go away without treatment within Monique Lin couple weeks. If treatment is needed, it varies from person to person. The goal of treatment is to reduce inflammation and protect the eye from damage. Treatment for Bell's palsy may include:  Medicines, such as: ? Steroids to reduce swelling and inflammation. ? Antiviral medicines. ? Pain relievers, including aspirin, acetaminophen, or ibuprofen.  Eye drops or ointment to keep your eye moist.  Eye protection, if you cannot close your eye.  Exercises or massage to regain muscle strength and function (physical therapy). Follow these instructions at home:  Take over-the-counter and prescription medicines only as told by your health care provider.  If your eye is affected: ? Keep your eye moist with eye drops or ointment as told by your health care provider. ? Follow instructions for eye care and protection as told by your health care provider.  Do any physical therapy exercises as told by your health care provider.  Keep all follow-up visits. This is important.   Contact Monique Lin health care provider if:  You have Monique Lin fever or chills.  Your symptoms do not get better within 2-3 weeks, or your symptoms get worse.  Your eye is red, irritated, or painful.  You have new symptoms. Get help right away if:  You have weakness or numbness in Monique Lin part of your body other than your face.  You   have trouble swallowing.  You develop neck pain or stiffness.  You develop dizziness or shortness of breath. Summary  Bell's palsy is Monique Lin short-term inability to move muscles in Monique Lin part of the face. The inability to move results from inflammation or compression of the facial nerve.  This condition affects every person differently. Sometimes symptoms go away without treatment within Monique Lin couple weeks.  If treatment is needed, it varies  from person to person. The goal of treatment is to reduce inflammation and protect the eye from damage.  Contact your health care provider if your symptoms do not get better within 2-3 weeks, or your symptoms get worse. This information is not intended to replace advice given to you by your health care provider. Make sure you discuss any questions you have with your health care provider. Document Revised: 08/19/2020 Document Reviewed: 08/19/2020 Elsevier Patient Education  2021 [employer]  

## 2021-02-01 NOTE — ED Notes (Signed)
Help get patient clean up patient is resting with call bell in reach 

## 2021-02-01 NOTE — ED Notes (Signed)
Speech Therapy at [phone] notified of pending eval prior to discharge,

## 2021-02-01 NOTE — Consult Note (Signed)
Neurology Consult H&P  Monique Lin MR# [id number] 02/01/2021  CC: left face droop  History is obtained from: patient and daughter who is a nurse  HPI: Monique Lin is a 87 y.o. female PMHx arthritis in knees/neck/shoulders, GIST s/p resection 2011 w/recurrence, HTN, hypothyroidism and emphysema who presents with left facial droop. Though at baseline she has pain in her neck and shoulders yesterday she had intense pain behind her left ear radiating down the left side of her neck. She woke and later in the morning, her daughter noticed left face droop. She tried drinking and fluid dripped out of the left side of her mouth. She was able to swallow solids and liquids without problem thought she felt her taste was altered.   In the ED BP217/159 down to 125/80 following IM hydralazine.  Denies focal weakness, F/C/D/N/V  LKW: this morning tpa given: No  IR Thrombectomy No,  Modified Rankin Scale: 0-Completely asymptomatic and back to baseline post- stroke NIHSS: 0  ROS: A complete ROS was performed and is negative except as noted in the HPI.   Past Medical History:  Diagnosis Date  . Anemia   . Bronchitis   . 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 Hypertension   . Hypothyroidism   . Leg pain   . Stomach tumor (benign)    Family History  Family history unknown: Yes    Social History:  reports that she has never smoked. She has never used smokeless tobacco. She reports that she does not drink alcohol and does not use drugs.   Prior to Admission medications   Medication Sig Start Date End Date Taking? Authorizing Provider  acetaminophen (TYLENOL) 650 MG CR tablet Take 650 mg by mouth every 8 (eight) hours as needed for pain.   Yes [provider]  albuterol (PROVENTIL) (2.5 MG/3ML) 0.083% nebulizer solution Take 2.5 mg by nebulization every 6 (six) hours as needed for wheezing.   Yes [provider]  albuterol  (VENTOLIN HFA) 108 (90 Base) MCG/ACT inhaler Inhale 1 puff into the lungs every 6 (six) hours as needed for wheezing or shortness of breath.   Yes [provider]  ALLEGRA ALLERGY 180 MG tablet Take 180 mg by mouth in the morning.   Yes [provider]  alum & mag hydroxide-simeth (MAALOX/MYLANTA) 200-200-20 MG/5ML suspension Take 30 mLs by mouth every 6 (six) hours as needed for indigestion, heartburn or flatulence. 12/22/19  Yes [name], MD  atorvastatin (LIPITOR) 20 MG tablet Take 20 mg by mouth daily.   Yes [provider]  bisacodyl (DULCOLAX) 5 MG EC tablet Take 1 tablet (5 mg total) by mouth daily as needed for moderate constipation. 12/22/19  Yes [name], MD  carvedilol (COREG) 6.25 MG tablet Take 1 tablet (6.25 mg total) by mouth 2 (two) times daily with a meal. 01/30/20  Yes Ennever, Rudell Cobb, MD  Cholecalciferol (VITAMIN D-3) 125 MCG (5000 UT) TABS Take 5,000 Units by mouth daily.    Yes [provider]  Cyanocobalamin (VITAMIN B-12 PO) Take 500 mcg by mouth daily.    Yes [provider]  folic acid (FOLVITE) 032 MCG tablet Take 800 mcg by mouth daily.    Yes [provider]  gabapentin (NEURONTIN) 300 MG capsule Take 1 capsule (300 mg total) by mouth 2 (two) times daily. 01/16/20  Yes Mercy Riding, MD  imatinib (GLEEVEC) 400 MG tablet Take 1 tablet (400 mg total)  by mouth daily. Take with meals and large glass of water.Caution:Chemotherapy Patient taking differently: Take 200 mg by mouth See admin instructions. Take 200 mg by mouth every other day- take with a meal and large glass of water.Caution:Chemotherapy 09/22/20  Yes Volanda Napoleon, MD  ondansetron (ZOFRAN) 8 MG tablet Take 1 tablet (8 mg total) by mouth every 8 (eight) hours as needed for nausea or vomiting. 03/15/20  Yes Ennever, Rudell Cobb, MD  pantoprazole (PROTONIX) 40 MG tablet TAKE 1 TABLET(40 MG) BY MOUTH TWICE DAILY Patient taking differently: Take 40 mg by mouth  in the morning and at bedtime. 09/22/20  Yes Ennever, Rudell Cobb, MD  potassium chloride SA (KLOR-CON) 20 MEQ tablet TAKE 1 TABLET(20 MEQ) BY MOUTH DAILY Patient taking differently: Take 20 mEq by mouth daily. 12/24/20  Yes Ennever, Rudell Cobb, MD  SYNTHROID 50 MCG tablet Take 50 mcg by mouth daily before breakfast.   Yes [provider]  trolamine salicylate (ASPERCREME) 10 % cream Apply 1 application topically as needed for muscle pain.   Yes [provider]  vitamin E 180 MG (400 UNITS) capsule Take 400 Units by mouth daily.   Yes [provider]    Exam: Current vital signs: BP (!) 147/70   Pulse 90   Temp 98 F (36.7 C) (Oral)   Resp (!) 24   SpO2 99%   Physical Exam  In acute distress due to pain in neck, head and knees Constitutional: Appears well-developed and well-nourished.  Psych: Affect appropriate to situation Eyes: No scleral injection HENT: No OP obstruction. Head: Normocephalic.  Cardiovascular: Normal rate and regular rhythm.  Respiratory: Effort normal, symmetric excursions bilaterally, no audible wheezing. GI: Soft.  No distension. There is no tenderness.  Skin: areas of vitiligo  Neuro: Mental Status: Patient is awake, alert, oriented to person, place, month, year, and situation. Patient is able to give a clear and coherent history. Speech fluent, intact comprehension and repetition. No signs of aphasia or neglect. Visual Fields are full. Pupils are equal, round, and reactive to light. EOMI without ptosis or diploplia. Left eye weakness on opening and decreased blinking.  Facial sensation is symmetric to temperature Complete left facial palsy.  Hearing is intact to voice. Uvula midline and palate elevates symmetrically. Shoulder shrug is symmetric. Tongue is midline without atrophy or fasciculations.  Tone is normal. Bulk is normal. Good strength in all extremities though limited by pain. Sensation is symmetric to light touch and  temperature in the arms and legs. Deep Tendon Reflexes: 2+ and symmetric in the biceps and patellae. Toes are downgoing bilaterally. FNF and HKS are intact bilaterally. Gait - Deferred  I have reviewed labs in epic and the pertinent results are:  Ref. Range 01/31/2021 15:43  Sodium Latest Ref Range: 135 - 145 mmol/L 138  Potassium Latest Ref Range: 3.5 - 5.1 mmol/L 4.9  Chloride Latest Ref Range: 98 - 111 mmol/L 107  CO2 Latest Ref Range: 22 - 32 mmol/L 21 (L)  Glucose Latest Ref Range: 70 - 99 mg/dL 140 (H)  BUN Latest Ref Range: 8 - 23 mg/dL 14  Creatinine Latest Ref Range: 0.44 - 1.00 mg/dL 1.21 (H)   I have reviewed the images obtained: MRI brain showed no acute intracranial abnormality. Age-related cerebral atrophy with moderate chronic small vessel ischemic disease. Chronic paranasal sinus disease, most pronounced within the left maxillary sinus.  Assessment: Monique Lin is a 87 y.o. female PMHx arthritis pain in knees/neck/shoulders, GIST (resection 2011 w/recurrence),  HTN, hypothyroidism and emphysema with complete left facial palsy. The patient is in a lot of pain and her daughter asked for patient to be admitted for observation and for pain control which is reasonable.  Impression:  Idiopathic left facial palsy. Left neck pain. HTN emergency. Chronic arthritic pain.  Plan: Eye drops at least 3 times daily Patch eye during sleep to prevent drying out. Blood pressure control. Please call neurology if there are any questions.  Electronically signed by:  Lynnae Sandhoff, MD Page: [phone] 02/01/2021, 2:18 AM

## 2021-02-01 NOTE — ED Notes (Signed)
Got patient off the bedside toilet got patient back in bed on the monitor with call bell in reach

## 2021-02-01 NOTE — H&P (Signed)
History and Physical    Monique Lin TDH:[id number] DOB: [birth year] DOA: 01/31/2021  PCP: [name], MD  Patient coming from: Home, 2 daughters at bedside  I have personally briefly reviewed patient's old medical records in [place]  Chief Complaint: left facial droop  HPI: Monique Lin is a 87 y.o. female with medical history significant for GIST s/p resection in 2011 with recurrence, HTN, hypothyroidism and emphysema who presents with left facial droop.  Patient woke up this morning and was well.  However later in the afternoon her daughter noticed left facial drooping.  Patient noticed it when she was drinking and fluid came out of the left side of her mouth.  She has difficulty closing her left eye.  Also notes worsening headache, neck pain and bilateral shoulder pain with left being the worst.  States those things are chronic but is worse today.  Also having some photosensitivity.  She denies any focal extremity weakness.  Denies nausea or vomiting but had an episode of emesis during my evaluation.  She has chronic abdominal pain due to her GIST tumor.  ED Course: She was afebrile, tachycardic and initially hypertensive up to 217/159.  Down to about 150/80 following IM hydralazine. CBC shows no leukocytosis or anemia.  Creatinine elevated 1.21 which appears to be her baseline.  Glucose of 140.  CT head and MRI brain showed no acute intracranial abnormalities.  There is chronic para sinusitis.  She was evaluated by neurology and thought that her symptoms more closely aligns with that of Bell's palsy given the inability to close her left eye and negative findings on imaging.  Hospitalist was called for admission for observation of Bell's palsy and elevated blood pressure.  Review of Systems: Constitutional: No Weight Change, No Fever ENT/Mouth: No sore throat, No Rhinorrhea Eyes: No Eye Pain, No Vision Changes Cardiovascular: No Chest Pain, no SOB Respiratory: No Cough, No  Sputum Gastrointestinal: No Nausea, No Vomiting, No Diarrhea, No Constipation, No Pain Genitourinary: no Urinary Incontinence, No Urgency, No Flank Pain Musculoskeletal: No Arthralgias, No Myalgias Skin: No Skin Lesions, No Pruritus, Neuro: no Weakness, + Numbness,  No Loss of Consciousness, No Syncope Psych: No Anxiety/Panic, No Depression, no decrease appetite Heme/Lymph: No Bruising, No Bleeding  Past Medical History:  Diagnosis Date  . Anemia   . Bronchitis   . 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 Hypertension   . Hypothyroidism   . Leg pain   . Stomach tumor (benign)     Past Surgical History:  Procedure Laterality Date  . BIOPSY  01/12/2020   Procedure: BIOPSY;  Surgeon: Ronald Lobo, MD;  Location: [place];  Service: Endoscopy;;  . ESOPHAGOGASTRODUODENOSCOPY (EGD) WITH PROPOFOL N/A 01/12/2020   Procedure: ESOPHAGOGASTRODUODENOSCOPY (EGD) WITH PROPOFOL;  Surgeon: Ronald Lobo, MD;  Location: [place];  Service: Endoscopy;  Laterality: N/A;  . ESOPHAGOGASTRODUODENOSCOPY (EGD) WITH PROPOFOL N/A 01/15/2020   Procedure: ESOPHAGOGASTRODUODENOSCOPY (EGD) WITH PROPOFOL;  Surgeon: Arta Silence, MD;  Location: [place];  Service: Endoscopy;  Laterality: N/A;  . EUS N/A 01/15/2020   Procedure: UPPER ENDOSCOPIC ULTRASOUND (EUS) RADIAL;  Surgeon: Arta Silence, MD;  Location: [place];  Service: Endoscopy;  Laterality: N/A;  . FINE NEEDLE ASPIRATION  01/15/2020   Procedure: FINE NEEDLE ASPIRATION (FNA) LINEAR;  Surgeon: Arta Silence, MD;  Location[facility] ENDOSCOPY;  Service: Endoscopy;;  . IR CATHETER TUBE CHANGE  04/14/2018  . IR THORACENTESIS ASP PLEURAL SPACE W/IMG GUIDE  04/05/2018  .  NASAL SINUS SURGERY    . stomach tumor removal       reports that she has never smoked. She has never used smokeless tobacco. She reports that she does not drink alcohol and does not use drugs. Social History  Allergies   Allergen Reactions  . Codeine Other (See Comments)    Stomach cramps  . Penicillins Other (See Comments)    "Bad stomach cramps" Has patient had a PCN reaction causing immediate rash, facial/tongue/throat swelling, SOB or lightheadedness with hypotension: Unk Has patient had a PCN reaction causing severe rash involving mucus membranes or skin necrosis: Unk Has patient had a PCN reaction that required hospitalization: Unk Has patient had a PCN reaction occurring within the last 10 years: No If all of the above answers are "NO", then may proceed with Cephalosporin use.      Family History  Family history unknown: Yes     Prior to Admission medications   Medication Sig Start Date End Date Taking? Authorizing Provider  acetaminophen (TYLENOL) 650 MG CR tablet Take 650 mg by mouth every 8 (eight) hours as needed for pain.   Yes [provider]  albuterol (PROVENTIL) (2.5 MG/3ML) 0.083% nebulizer solution Take 2.5 mg by nebulization every 6 (six) hours as needed for wheezing.   Yes [provider]  albuterol (VENTOLIN HFA) 108 (90 Base) MCG/ACT inhaler Inhale 1 puff into the lungs every 6 (six) hours as needed for wheezing or shortness of breath.   Yes [provider]  ALLEGRA ALLERGY 180 MG tablet Take 180 mg by mouth in the morning.   Yes [provider]  alum & mag hydroxide-simeth (MAALOX/MYLANTA) 200-200-20 MG/5ML suspension Take 30 mLs by mouth every 6 (six) hours as needed for indigestion, heartburn or flatulence. 12/22/19  Yes [name], MD  atorvastatin (LIPITOR) 20 MG tablet Take 20 mg by mouth daily.   Yes [provider]  bisacodyl (DULCOLAX) 5 MG EC tablet Take 1 tablet (5 mg total) by mouth daily as needed for moderate constipation. 12/22/19  Yes [name], MD  carvedilol (COREG) 6.25 MG tablet Take 1 tablet (6.25 mg total) by mouth 2 (two) times daily with a meal. 01/30/20  Yes Ennever, Rudell Cobb, MD  Cholecalciferol (VITAMIN  D-3) 125 MCG (5000 UT) TABS Take 5,000 Units by mouth daily.    Yes [provider]  Cyanocobalamin (VITAMIN B-12 PO) Take 500 mcg by mouth daily.    Yes [provider]  folic acid (FOLVITE) 160 MCG tablet Take 800 mcg by mouth daily.    Yes [provider]  gabapentin (NEURONTIN) 300 MG capsule Take 1 capsule (300 mg total) by mouth 2 (two) times daily. 01/16/20  Yes Mercy Riding, MD  imatinib (GLEEVEC) 400 MG tablet Take 1 tablet (400 mg total) by mouth daily. Take with meals and large glass of water.Caution:Chemotherapy Patient taking differently: Take 200 mg by mouth See admin instructions. Take 200 mg by mouth every other day- take with a meal and large glass of water.Caution:Chemotherapy 09/22/20  Yes Volanda Napoleon, MD  ondansetron (ZOFRAN) 8 MG tablet Take 1 tablet (8 mg total) by mouth every 8 (eight) hours as needed for nausea or vomiting. 03/15/20  Yes Ennever, Rudell Cobb, MD  pantoprazole (PROTONIX) 40 MG tablet TAKE 1 TABLET(40 MG) BY MOUTH TWICE DAILY Patient taking differently: Take 40 mg by mouth in the morning and at bedtime. 09/22/20  Yes Volanda Napoleon, MD  potassium chloride SA (KLOR-CON) 20 MEQ  tablet TAKE 1 TABLET(20 MEQ) BY MOUTH DAILY Patient taking differently: Take 20 mEq by mouth daily. 12/24/20  Yes Ennever, Rudell Cobb, MD  SYNTHROID 50 MCG tablet Take 50 mcg by mouth daily before breakfast.   Yes [provider]  trolamine salicylate (ASPERCREME) 10 % cream Apply 1 application topically as needed for muscle pain.   Yes [provider]  vitamin E 180 MG (400 UNITS) capsule Take 400 Units by mouth daily.   Yes [provider]    Physical Exam: Vitals:   01/31/21 2230 01/31/21 2300 01/31/21 2330 02/01/21 0000  BP: 139/68 135/77 (!) 166/79 (!) 141/68  Pulse: 95 91 97 91  Resp: 13 19 (!) 24 19  Temp:      TempSrc:      SpO2: 99% 99% 99% 100%    Constitutional: NAD, calm, comfortable, elderly female appearing younger  than her stated age laying at 52 degree incline in bed Vitals:   01/31/21 2230 01/31/21 2300 01/31/21 2330 02/01/21 0000  BP: 139/68 135/77 (!) 166/79 (!) 141/68  Pulse: 95 91 97 91  Resp: 13 19 (!) 24 19  Temp:      TempSrc:      SpO2: 99% 99% 99% 100%   Eyes: PERRL, lids and conjunctivae normal, unable to test for closure of the left eye ENMT: Mucous membranes are moist.  Neck: normal, supple Respiratory: clear to auscultation bilaterally, no wheezing, no crackles. Normal respiratory effort. No accessory muscle use.  Cardiovascular: Regular rate and rhythm, no murmurs / rubs / gallops. No extremity edema. 2+ pedal pulses.   Abdomen: Mild epigastric tenderness,, no masses palpated.Bowel sounds positive.  Musculoskeletal: no clubbing / cyanosis. No joint deformity upper and lower extremities. Skin: no rashes, lesions, ulcers. No induration Neurologic: CN 2-12 grossly intact.  Facial asymmetry with left facial droop, decreased sensation on the left forehead compared to the right, strength 5/5 in all extremity except patient unable to lift right lower extremity due to pain into her right knee due to osteoarthritis. Psychiatric: Normal judgment and insight. Alert and oriented x 3. Normal mood.     Labs on Admission: I have personally reviewed following labs and imaging studies  CBC: Recent Labs  Lab 01/31/21 1543  WBC 4.6  NEUTROABS 2.8  HGB 12.2  HCT 38.1  MCV 96.2  PLT 242*   Basic Metabolic Panel: Recent Labs  Lab 01/31/21 1543  NA 138  K 4.9  CL 107  CO2 21*  GLUCOSE 140*  BUN 14  CREATININE 1.21*  CALCIUM 9.2   GFR: CrCl cannot be calculated (Unknown ideal weight.). Liver Function Tests: Recent Labs  Lab 01/31/21 1543  AST 22  ALT 10  ALKPHOS 61  BILITOT 1.0  PROT 6.7  ALBUMIN 3.3*   No results for input(s): LIPASE, AMYLASE in the last 168 hours. No results for input(s): AMMONIA in the last 168 hours. Coagulation Profile: No results for input(s):  INR, PROTIME in the last 168 hours. Cardiac Enzymes: No results for input(s): CKTOTAL, CKMB, CKMBINDEX, TROPONINI in the last 168 hours. BNP (last 3 results) No results for input(s): PROBNP in the last 8760 hours. HbA1C: No results for input(s): HGBA1C in the last 72 hours. CBG: No results for input(s): GLUCAP in the last 168 hours. Lipid Profile: No results for input(s): CHOL, HDL, LDLCALC, TRIG, CHOLHDL, LDLDIRECT in the last 72 hours. Thyroid Function Tests: No results for input(s): TSH, T4TOTAL, FREET4, T3FREE, THYROIDAB in the last 72 hours. Anemia Panel: No  results for input(s): VITAMINB12, FOLATE, FERRITIN, TIBC, IRON, RETICCTPCT in the last 72 hours. Urine analysis:    Component Value Date/Time   COLORURINE STRAW (A) 01/31/2021 1743   APPEARANCEUR CLEAR 01/31/2021 1743   LABSPEC 1.009 01/31/2021 1743   PHURINE 7.0 01/31/2021 1743   GLUCOSEU NEGATIVE 01/31/2021 1743   HGBUR NEGATIVE 01/31/2021 1743   BILIRUBINUR NEGATIVE 01/31/2021 1743   KETONESUR NEGATIVE 01/31/2021 1743   PROTEINUR NEGATIVE 01/31/2021 1743   UROBILINOGEN 0.2 11/12/2010 2324   NITRITE NEGATIVE 01/31/2021 1743   LEUKOCYTESUR SMALL (A) 01/31/2021 1743    Radiological Exams on Admission: CT Head Wo Contrast  Result Date: 01/31/2021 CLINICAL DATA:  Facial droop EXAM: CT HEAD WITHOUT CONTRAST TECHNIQUE: Contiguous axial images were obtained from the base of the skull through the vertex without intravenous contrast. COMPARISON:  04/01/2018 FINDINGS: Brain: There is atrophy and chronic small vessel disease changes. No acute intracranial abnormality. Specifically, no hemorrhage, hydrocephalus, mass lesion, acute infarction, or significant intracranial injury. Vascular: No hyperdense vessel or unexpected calcification. Skull: No acute calvarial abnormality. Sinuses/Orbits: Near complete opacification of the left maxillary sinus. Mucosal thickening throughout the paranasal sinuses. No acute findings. Mastoid air  cells clear. Other: None IMPRESSION: Atrophy, chronic microvascular disease. No acute intracranial abnormality. Chronic sinusitis. Electronically Signed   By: Rolm Baptise M.D.   On: 01/31/2021 16:38   MR BRAIN WO CONTRAST  Result Date: 01/31/2021 CLINICAL DATA:  Initial evaluation for left-sided facial droop with numbness. Day. EXAM: MRI HEAD WITHOUT CONTRAST TECHNIQUE: Multiplanar, multiecho pulse sequences of the brain and surrounding structures were obtained without intravenous contrast. COMPARISON:  Prior head CT from earlier the same FINDINGS: Brain: Examination degraded by motion artifact. Generalized age-related cerebral atrophy. Patchy and confluent T2/FLAIR hyperintensity within the periventricular deep white matter both cerebral hemispheres most consistent with chronic small vessel ischemic disease, moderate in nature. No evidence for acute or subacute infarct. Gray-white matter differentiation maintained. No encephalomalacia to suggest chronic cortical infarction. No evidence for acute or chronic intracranial hemorrhage. No mass lesion, midline shift or mass effect. No hydrocephalus or extra-axial fluid collection. Pituitary gland suprasellar region normal. Midline structures intact. Vascular: Major intracranial vascular flow voids are maintained. Skull and upper cervical spine: Craniocervical junction within normal limits. Multilevel degenerative spondylosis noted throughout the upper cervical spine without high-grade stenosis. Bone marrow signal intensity normal. No scalp soft tissue abnormality. Sinuses/Orbits: Patient status post ocular lens replacement on the right. Globes and orbital soft tissues demonstrate no acute finding. Complete opacification of the left maxillary sinus with chronic mucosal thickening elsewhere throughout the paranasal sinuses. Trace right mastoid effusion, of doubtful significance. Inner ear structures grossly normal. Other: None. IMPRESSION: 1. No acute intracranial  abnormality. 2. Age-related cerebral atrophy with moderate chronic small vessel ischemic disease. 3. Chronic paranasal sinus disease, most pronounced within the left maxillary sinus. Electronically Signed   By: Jeannine Boga M.D.   On: 01/31/2021 19:49      Assessment/Plan  Left facial droop secondary to Bell's palsy -Patient had negative CT head and MRI brain.  She was unable to close her left eye and has some paresthesia of the left forehead more closely aligned with Bell's palsy rather than stroke. -Continue daily prednisone, Valtrex 1g TID. Pt had an episode of vomiting following prednisone. Could give antiemetics prior to next dose and if not able to tolerate could give IV while inpatient - Lubricant eye drops TID to left eye   HTN  Some of her elevated blood pressure could be  attributed to headache and shoulder pain. Continue home Coreg Patient previously on amlodipine and losartan but this was continued due to syncopal episodes Will help with pain management of her neck and shoulders and see if blood pressure improves   Recurrent GIST of the stomach  Follows with oncology Dr. Marin Olp  On Gleevec.  No bleeding  continue to follow onc outpt   Hypothyroidism  Continue levothyroxine   DVT prophylaxis: SCDs Code Status: Full Family Communication: Plan discussed with patient and daughters at bedside  disposition Plan: Home with observation Consults called:  Admission status: Observation  Level of care: Telemetry Medical  Status is: Observation  The patient remains OBS appropriate and will d/c before 2 midnights.  Dispo: The patient is from: Home              Anticipated d/c is to: Home              Patient currently is not medically stable to d/c.   Difficult to place patient No         Orene Desanctis DO [facility]   If 7PM-7AM, please contact night-coverage [email or web address]   02/01/2021, 12:51 AM

## 2021-02-02 ENCOUNTER — Telehealth: Payer: Self-pay

## 2021-02-02 NOTE — Telephone Encounter (Signed)
Pts daughter called from [phone] to cancel her 02/04/21 appts as she has been in the hosp and had been dx with Bell's Palsy, req to know if she should be doing anything different with chemo meds at this time  Webb Silversmith

## 2021-02-03 ENCOUNTER — Ambulatory Visit: Payer: Medicare Other | Admitting: Hematology & Oncology

## 2021-02-03 ENCOUNTER — Other Ambulatory Visit: Payer: Medicare Other

## 2021-02-03 ENCOUNTER — Other Ambulatory Visit (HOSPITAL_BASED_OUTPATIENT_CLINIC_OR_DEPARTMENT_OTHER): Payer: Medicare Other

## 2021-02-04 ENCOUNTER — Ambulatory Visit (HOSPITAL_BASED_OUTPATIENT_CLINIC_OR_DEPARTMENT_OTHER): Payer: Medicare Other

## 2021-02-04 ENCOUNTER — Inpatient Hospital Stay: Payer: Medicare Other | Admitting: Hematology & Oncology

## 2021-02-04 ENCOUNTER — Other Ambulatory Visit: Payer: Medicare Other

## 2021-02-04 ENCOUNTER — Inpatient Hospital Stay: Payer: Medicare Other

## 2021-02-10 ENCOUNTER — Other Ambulatory Visit: Payer: Medicare Other

## 2021-02-10 ENCOUNTER — Ambulatory Visit: Payer: Medicare Other | Admitting: Hematology & Oncology

## 2021-02-23 ENCOUNTER — Ambulatory Visit (HOSPITAL_BASED_OUTPATIENT_CLINIC_OR_DEPARTMENT_OTHER): Payer: Medicare Other

## 2021-02-23 ENCOUNTER — Inpatient Hospital Stay: Payer: Medicare Other

## 2021-02-23 ENCOUNTER — Ambulatory Visit (HOSPITAL_BASED_OUTPATIENT_CLINIC_OR_DEPARTMENT_OTHER)
Admission: RE | Admit: 2021-02-23 | Discharge: 2021-02-23 | Disposition: A | Discharge status: Home / Self Care | Payer: Medicare Other | Source: Ambulatory Visit | Attending: Hematology & Oncology | Admitting: Hematology & Oncology

## 2021-02-23 ENCOUNTER — Other Ambulatory Visit: Payer: Self-pay

## 2021-02-23 ENCOUNTER — Encounter (HOSPITAL_BASED_OUTPATIENT_CLINIC_OR_DEPARTMENT_OTHER): Payer: Self-pay

## 2021-02-23 ENCOUNTER — Encounter: Payer: Self-pay | Admitting: Hematology & Oncology

## 2021-02-23 ENCOUNTER — Inpatient Hospital Stay: Payer: Medicare Other | Attending: Hematology & Oncology | Admitting: Hematology & Oncology

## 2021-02-23 ENCOUNTER — Other Ambulatory Visit (HOSPITAL_BASED_OUTPATIENT_CLINIC_OR_DEPARTMENT_OTHER): Payer: Medicare Other

## 2021-02-23 VITALS — BP 177/100 | HR 86 | Temp 97.6°F | Resp 19 | Wt 172.0 lb

## 2021-02-23 DIAGNOSIS — C49A0 Gastrointestinal stromal tumor, unspecified site: Secondary | ICD-10-CM | POA: Insufficient documentation

## 2021-02-23 DIAGNOSIS — R197 Diarrhea, unspecified: Secondary | ICD-10-CM | POA: Insufficient documentation

## 2021-02-23 DIAGNOSIS — C49A2 Gastrointestinal stromal tumor of stomach: Secondary | ICD-10-CM | POA: Diagnosis not present

## 2021-02-23 DIAGNOSIS — G510 Bell's palsy: Secondary | ICD-10-CM | POA: Diagnosis not present

## 2021-02-23 LAB — CMP (CANCER CENTER ONLY)
ALT: 10 U/L (ref 0–44)
AST: 14 U/L — ABNORMAL LOW (ref 15–41)
Albumin: 3.8 g/dL (ref 3.5–5.0)
Alkaline Phosphatase: 84 U/L (ref 38–126)
Anion gap: 9 (ref 5–15)
BUN: 16 mg/dL (ref 8–23)
CO2: 24 mmol/L (ref 22–32)
Calcium: 9.8 mg/dL (ref 8.9–10.3)
Chloride: 110 mmol/L (ref 98–111)
Creatinine: 1.28 mg/dL — ABNORMAL HIGH (ref 0.44–1.00)
GFR, Estimated: 40 mL/min — ABNORMAL LOW (ref >60)
Glucose, Bld: 90 mg/dL (ref 70–99)
Potassium: 5.4 mmol/L — ABNORMAL HIGH (ref 3.5–5.1)
Sodium: 143 mmol/L (ref 135–145)
Total Bilirubin: 0.7 mg/dL (ref 0.3–1.2)
Total Protein: 7 g/dL (ref 6.5–8.1)

## 2021-02-23 LAB — CBC WITH DIFFERENTIAL (CANCER CENTER ONLY)
Abs Immature Granulocytes: 0.01 10*3/uL (ref 0.00–0.07)
Basophils Absolute: 0 10*3/uL (ref 0.0–0.1)
Basophils Relative: 1 %
Eosinophils Absolute: 0.1 10*3/uL (ref 0.0–0.5)
Eosinophils Relative: 3 %
HCT: 36.4 % (ref 36.0–46.0)
Hemoglobin: 11.9 g/dL — ABNORMAL LOW (ref 12.0–15.0)
Immature Granulocytes: 0 %
Lymphocytes Relative: 36 %
Lymphs Abs: 1.6 10*3/uL (ref 0.7–4.0)
MCH: 31.6 pg (ref 26.0–34.0)
MCHC: 32.7 g/dL (ref 30.0–36.0)
MCV: 96.8 fL (ref 80.0–100.0)
Monocytes Absolute: 0.3 10*3/uL (ref 0.1–1.0)
Monocytes Relative: 7 %
Neutro Abs: 2.4 10*3/uL (ref 1.7–7.7)
Neutrophils Relative %: 53 %
Platelet Count: 183 10*3/uL (ref 150–400)
RBC: 3.76 MIL/uL — ABNORMAL LOW (ref 3.87–5.11)
RDW: 16 % — ABNORMAL HIGH (ref 11.5–15.5)
WBC Count: 4.4 10*3/uL (ref 4.0–10.5)
nRBC: 0 % (ref 0.0–0.2)

## 2021-02-23 LAB — FERRITIN: Ferritin: 122 ng/mL (ref 11–307)

## 2021-02-23 LAB — RETICULOCYTES
Immature Retic Fract: 4.9 % (ref 2.3–15.9)
RBC.: 3.76 MIL/uL — ABNORMAL LOW (ref 3.87–5.11)
Retic Count, Absolute: 36.5 10*3/uL (ref 19.0–186.0)
Retic Ct Pct: 1 % (ref 0.4–3.1)

## 2021-02-23 LAB — IRON AND TIBC
Iron: 99 ug/dL (ref 28–170)
Saturation Ratios: 43 % — ABNORMAL HIGH (ref 10.4–31.8)
TIBC: 230 ug/dL — ABNORMAL LOW (ref 250–450)
UIBC: 131 ug/dL

## 2021-02-23 MED ORDER — IOHEXOL 300 MG/ML  SOLN
100.0000 mL | Freq: Once | INTRAMUSCULAR | Status: AC | PRN
  Administered 2021-02-23: 80 mL via INTRAVENOUS

## 2021-02-23 NOTE — Progress Notes (Signed)
Hematology and Lin Follow Up Visit  Monique Lin [id number] Mar 13, 1927 87 y.o. 02/23/2021   Principle Diagnosis:   Recurrent GIST of the stomach  Current Therapy:    Gleevec 400 mg p.o.q other day-to start on 01/31/2020 --increase back up to 400 mg p.o. q. day starting 02/23/2021      Interim History:  Monique Lin is in for follow-up.  Unfortunately, she recently was found to have Bell's palsy on the left side of her face.  This was on February 28.  She has a patch on the left eye.  She still cannot close her left eye.  She is getting some eyedrops.  I suppose she probably was on some antiviral.  She is post to see a neurologist.  However she cannot see them until May.  She did have a CT scan done today.  This showed that the GIST in her stomach was increasing in size.  It was up to 8.2 x 5.5 cm.  This probably is about increase of about 10-15%.  She does not have any other disease on the CAT scan.  We are going to have to try to increase her Gleevec now.  Before, she is having problems with swelling/fluid retention.  We can certainly try to him manage that if this happens again.  I told her and her daughter to take the Gleevec 200 mg daily for 2 weeks and then take 200 mg a day alternating with 400 mg a day for 2 weeks and then go up to 400 mg a day.  Hopefully, by doing this, her body will be able to adapt.  I just want her quality of life to be as good as possible.  She has had no problems with nausea or vomiting.  She is having diarrhea from the IV contrast and oral contrast that she received for the CT scan.  She has a performance status right now without ECOG 2.       Medications:  Current Outpatient Medications:  .  acetaminophen (TYLENOL) 650 MG CR tablet, Take 650 mg by mouth every 8 (eight) hours as needed for pain., Disp: , Rfl:  .  albuterol (PROVENTIL) (2.5 MG/3ML) 0.083% nebulizer solution, Take 2.5 mg by nebulization every 6 (six) hours as needed for wheezing., Disp:  , Rfl:  .  albuterol (VENTOLIN HFA) 108 (90 Base) MCG/ACT inhaler, Inhale 1 puff into the lungs every 6 (six) hours as needed for wheezing or shortness of breath., Disp: , Rfl:  .  ALLEGRA ALLERGY 180 MG tablet, Take 180 mg by mouth in the morning., Disp: , Rfl:  .  alum & mag hydroxide-simeth (MAALOX/MYLANTA) 921-194-17 MG/5ML suspension, Take 30 mLs by mouth every 6 (six) hours as needed for indigestion, heartburn or flatulence., Disp: 355 mL, Rfl: 0 .  artificial tears (LACRILUBE) OINT ophthalmic ointment, Place into the left eye at bedtime., Disp: 1 g, Rfl: 1 .  atorvastatin (LIPITOR) 20 MG tablet, Take 20 mg by mouth daily., Disp: , Rfl:  .  bisacodyl (DULCOLAX) 5 MG EC tablet, Take 1 tablet (5 mg total) by mouth daily as needed for moderate constipation., Disp: 30 tablet, Rfl: 0 .  carvedilol (COREG) 6.25 MG tablet, Take 1 tablet (6.25 mg total) by mouth 2 (two) times daily with a meal., Disp: 14 tablet, Rfl: 0 .  Cholecalciferol (VITAMIN D-3) 125 MCG (5000 UT) TABS, Take 5,000 Units by mouth daily. , Disp: , Rfl:  .  Cyanocobalamin (VITAMIN B-12 PO), Take 500 mcg by  mouth daily. , Disp: , Rfl:  .  folic acid (FOLVITE) 638 MCG tablet, Take 800 mcg by mouth daily. , Disp: , Rfl:  .  gabapentin (NEURONTIN) 300 MG capsule, Take 1 capsule (300 mg total) by mouth 2 (two) times daily., Disp: 180 capsule, Rfl: 1 .  hydroxypropyl methylcellulose / hypromellose (ISOPTO TEARS / GONIOVISC) 2.5 % ophthalmic solution, Place 1 drop into the left eye 3 (three) times daily., Disp: 15 mL, Rfl: 12 .  imatinib (GLEEVEC) 400 MG tablet, Take 1 tablet (400 mg total) by mouth daily. Take with meals and large glass of water.Caution:Chemotherapy (Patient taking differently: Take 200 mg by mouth See admin instructions. Take 200 mg by mouth every other day- take with a meal and large glass of water.Caution:Chemotherapy), Disp: 45 tablet, Rfl: 3 .  losartan (COZAAR) 25 MG tablet, Take 1 tablet (25 mg total) by mouth daily.,  Disp: 30 tablet, Rfl: 0 .  ondansetron (ZOFRAN) 8 MG tablet, Take 1 tablet (8 mg total) by mouth every 8 (eight) hours as needed for nausea or vomiting., Disp: 20 tablet, Rfl: 0 .  pantoprazole (PROTONIX) 40 MG tablet, TAKE 1 TABLET(40 MG) BY MOUTH TWICE DAILY (Patient taking differently: Take 40 mg by mouth in the morning and at bedtime.), Disp: 20 tablet, Rfl: 0 .  potassium chloride SA (KLOR-CON) 20 MEQ tablet, TAKE 1 TABLET(20 MEQ) BY MOUTH DAILY (Patient taking differently: Take 20 mEq by mouth daily.), Disp: 90 tablet, Rfl: 1 .  SYNTHROID 50 MCG tablet, Take 50 mcg by mouth daily before breakfast., Disp: , Rfl:  .  trolamine salicylate (ASPERCREME) 10 % cream, Apply 1 application topically as needed for muscle pain., Disp: , Rfl:  .  vitamin E 180 MG (400 UNITS) capsule, Take 400 Units by mouth daily., Disp: , Rfl:   Allergies:  Allergies  Allergen Reactions  . Codeine Other (See Comments)    Stomach cramps  . Penicillins Other (See Comments)    "Bad stomach cramps" Has patient had a PCN reaction causing immediate rash, facial/tongue/throat swelling, SOB or lightheadedness with hypotension: Unk Has patient had a PCN reaction causing severe rash involving mucus membranes or skin necrosis: Unk Has patient had a PCN reaction that required hospitalization: Unk Has patient had a PCN reaction occurring within the last 10 years: No If all of the above answers are "NO", then may proceed with Cephalosporin use.      Past Medical History, Surgical history, Social history, and Family History were reviewed and updated.  Review of Systems: Review of Systems  Constitutional: Negative.   HENT:  Negative.   Eyes: Negative.   Respiratory: Positive for wheezing.   Cardiovascular: Negative.   Gastrointestinal: Positive for abdominal pain.  Endocrine: Negative.   Genitourinary: Negative.    Musculoskeletal: Negative.   Skin: Negative.   Neurological: Negative.   Hematological: Negative.    Psychiatric/Behavioral: Negative.     Physical Exam:  vitals were not taken for this visit.   Wt Readings from Last 3 Encounters:  12/31/20 177 lb (80.3 kg)  11/19/20 176 lb (79.8 kg)  09/29/20 170 lb 12.8 oz (77.5 kg)    Physical Exam Vitals reviewed.  HENT:     Head: Normocephalic and atraumatic.  Eyes:     Pupils: Pupils are equal, round, and reactive to light.  Cardiovascular:     Rate and Rhythm: Normal rate and regular rhythm.     Heart sounds: Normal heart sounds.  Pulmonary:     Effort: Pulmonary  effort is normal.     Breath sounds: Normal breath sounds.  Abdominal:     General: Bowel sounds are normal.     Palpations: Abdomen is soft.  Musculoskeletal:        General: No tenderness or deformity. Normal range of motion.     Cervical back: Normal range of motion.  Lymphadenopathy:     Cervical: No cervical adenopathy.  Skin:    General: Skin is warm and dry.     Findings: No erythema or rash.  Neurological:     Mental Status: She is alert and oriented to person, place, and time.  Psychiatric:        Behavior: Behavior normal.        Thought Content: Thought content normal.        Judgment: Judgment normal.      Lab Results  Component Value Date   WBC 4.4 02/23/2021   HGB 11.9 (L) 02/23/2021   HCT 36.4 02/23/2021   MCV 96.8 02/23/2021   PLT 183 02/23/2021     Chemistry      Component Value Date/Time   NA 143 02/23/2021 1151   K 5.4 (H) 02/23/2021 1151   CL 110 02/23/2021 1151   CO2 24 02/23/2021 1151   BUN 16 02/23/2021 1151   CREATININE 1.28 (H) 02/23/2021 1151      Component Value Date/Time   CALCIUM 9.8 02/23/2021 1151   ALKPHOS 84 02/23/2021 1151   AST 14 (L) 02/23/2021 1151   ALT 10 02/23/2021 1151   BILITOT 0.7 02/23/2021 1151      Impression and Plan: Monique Lin is a 87 year old African-American female.  She really does not look her age.  She is still quite spunky.  Even though she now has the Bell's palsy, she is still try to  manage as good as possible.  We will hopefully see that the tumor will respond to the increasing [place].  If not, then we will have to make a change to either Sutent or to the newer agent- ripretinib..  Again, we will slowly increase the dose of the Gleevec.  We will plan to get her back to see [place] in 6 weeks now.  I does want her quality of life to continue to be a priority.  Given her age, she wants to focus on her quality of life also.  As always, it is enjoyable talking to both Ms. Denard and her daughter.  As always, it is somewhat fun talking to both her and her daughter.  Sheralyn Boatman, MD 3/23/20223:06 PM

## 2021-02-25 ENCOUNTER — Other Ambulatory Visit: Payer: Self-pay | Admitting: *Deleted

## 2021-02-25 DIAGNOSIS — C49A2 Gastrointestinal stromal tumor of stomach: Secondary | ICD-10-CM

## 2021-02-25 MED ORDER — IMATINIB MESYLATE 400 MG PO TABS: 400.0000 mg | ORAL_TABLET | Freq: Every day | ORAL | 3 refills | Status: DC

## 2021-03-25 ENCOUNTER — Inpatient Hospital Stay: Payer: Medicare Other | Admitting: Hematology & Oncology

## 2021-03-25 ENCOUNTER — Inpatient Hospital Stay: Payer: Medicare Other | Attending: Hematology & Oncology

## 2021-03-28 ENCOUNTER — Telehealth: Payer: Self-pay

## 2021-03-28 NOTE — Telephone Encounter (Signed)
Called daughter to r/s pts appt per sch message, she is aware of my call and place me on hold x90min, I d/c the call and will await her c/b    Ezariah Nace

## 2021-03-29 ENCOUNTER — Telehealth: Payer: Self-pay

## 2021-03-29 NOTE — Telephone Encounter (Signed)
Returned call to r/s missed appt, concerned as she states she was not aware of this appt, a new appt was made after trying to find a good time and pt was eventually placed to see sarah and a calendar has been mailed to her and the home address for the pt   Monique Lin

## 2021-04-06 ENCOUNTER — Encounter: Payer: Self-pay | Admitting: Neurology

## 2021-04-06 ENCOUNTER — Ambulatory Visit (INDEPENDENT_AMBULATORY_CARE_PROVIDER_SITE_OTHER): Payer: Medicare Other | Admitting: Neurology

## 2021-04-06 ENCOUNTER — Other Ambulatory Visit: Payer: Self-pay

## 2021-04-06 VITALS — BP 154/82 | HR 100 | Ht 63.0 in | Wt 167.0 lb

## 2021-04-06 DIAGNOSIS — G510 Bell's palsy: Secondary | ICD-10-CM

## 2021-04-06 NOTE — Progress Notes (Signed)
Subjective:    Patient ID: Monique Lin is a 87 y.o. female.  HPI      , MD, PhD [facility] [address], [address] [address] [place], [place] [address]  I saw patient, Monique Lin, as a referral from the hospital for evaluation of her left facial droop. The patient is accompanied by her daughter today. Monique Lin is a 87 -year-old right-handed woman with an underlying medical history of hypertension, hypothyroidism, anemia, bronchitis, gastrointestinal stromal tumor, and overweight state, who reports that her left-sided facial weakness is just a little bit better.  She still has trouble closing her left eye completely and uses the eye patch at night and also lubricating eyedrops on a regular basis.  She sometimes uses the eye patch during the day as well when her eye gets irritated.  Sometimes the bright light bothers it as she cannot completely close it all the time but she would say that she has improved slightly.  She still has significant left facial droop.  She never had any sudden onset of one-sided weakness especially when she first presented with symptoms.  Her history is supplemented by her daughter.  She presented to the emergency room on 01/31/2021 with new onset left-sided facial droop.  I reviewed the emergency room records.  She was admitted for observation due to paresthesias on the right.  Work-up included CT head without contrast on 01/31/2021, I reviewed the results: IMPRESSION: Atrophy, chronic microvascular disease.   No acute intracranial abnormality.   Chronic sinusitis.  She had an MRI brain without contrast on 01/31/2021 and I reviewed the results: IMPRESSION: 1. No acute intracranial abnormality. 2. Age-related cerebral atrophy with moderate chronic small vessel ischemic disease. 3. Chronic paranasal sinus disease, most pronounced within the left maxillary sinus.  In the emergency room, she was noted to be hypertensive with a blood  pressure as high as 217/159.  She had a mildly elevated creatinine level which was felt to be her baseline.  She was admitted due to hypertension, she was discharged on 02/01/2021, she was discharged with a steroid taper and Valtrex prescription for Bell's palsy.  She was advised to use eyedrops and patching her eye.  She was restarted on losartan and advised to follow-up with her oncologist for her GI tumor and with her primary care for blood pressure management.  She has no other concerns today.  She has lower extremity swelling for which she elevates her legs.  She reports no significant headaches.  She has a history of hearing loss and was evaluated for hearing aids some 8 years ago but never got any hearing aids.  She has not seen her ophthalmologist in about 3 years.  She has been seen by Dr. Groat in the past.  She had cataract surgery on the right and never got around to getting the left side done.  Before she had her scheduled left side cataract, she became sick and had pneumonia and a collapsed lung and had to be hospitalized several years ago.  She lives with her other daughter.  She had a total of 4 children, both her sons have passed away unfortunately.  Her Past Medical History Is Significant For: Past Medical History:  Diagnosis Date  . Anemia   . Bronchitis   . 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 Hypertension   . Hypothyroidism   . Leg pain   . Stomach tumor (benign)       Her Past Surgical History Is Significant For: Past Surgical History:  Procedure Laterality Date  . BIOPSY  01/12/2020   Procedure: BIOPSY;  Surgeon: Ronald Lobo, MD;  Location: [facility];  Service: Endoscopy;;  . ESOPHAGOGASTRODUODENOSCOPY (EGD) WITH PROPOFOL N/A 01/12/2020   Procedure: ESOPHAGOGASTRODUODENOSCOPY (EGD) WITH PROPOFOL;  Surgeon: Ronald Lobo, MD;  Location: [facility];  Service: Endoscopy;  Laterality: N/A;  .  ESOPHAGOGASTRODUODENOSCOPY (EGD) WITH PROPOFOL N/A 01/15/2020   Procedure: ESOPHAGOGASTRODUODENOSCOPY (EGD) WITH PROPOFOL;  Surgeon: Arta Silence, MD;  Location: [facility];  Service: Endoscopy;  Laterality: N/A;  . EUS N/A 01/15/2020   Procedure: UPPER ENDOSCOPIC ULTRASOUND (EUS) RADIAL;  Surgeon: Arta Silence, MD;  Location: [facility];  Service: Endoscopy;  Laterality: N/A;  . FINE NEEDLE ASPIRATION  01/15/2020   Procedure: FINE NEEDLE ASPIRATION (FNA) LINEAR;  Surgeon: Arta Silence, MD;  Location: [facility] ENDOSCOPY;  Service: Endoscopy;;  . IR CATHETER TUBE CHANGE  04/14/2018  . IR THORACENTESIS ASP PLEURAL SPACE W/IMG GUIDE  04/05/2018  . NASAL SINUS SURGERY    . stomach tumor removal      Her Family History Is Significant For: Family History  Family history unknown: Yes    Her Social History Is Significant For: Social History   Socioeconomic History  . Marital status: Widowed    Spouse name: Not on file  . Number of children: Not on file  . Years of education: Not on file  . Highest education level: Not on file  Occupational History  . Not on file  Tobacco Use  . Smoking status: Never Smoker  . Smokeless tobacco: Never Used  Vaping Use  . Vaping Use: Never used  Substance and Sexual Activity  . Alcohol use: No  . Drug use: No  . Sexual activity: Not Currently  Other Topics Concern  . Not on file  Social History Narrative  . Not on file   Social Determinants of Health   Financial Resource Strain: Not on file  Food Insecurity: Not on file  Transportation Needs: Not on file  Physical Activity: Not on file  Stress: Not on file  Social Connections: Not on file    Her Allergies Are:  Allergies  Allergen Reactions  . Codeine Other (See Comments)    Stomach cramps  . Penicillins Other (See Comments)    "Bad stomach cramps" Has patient had a PCN reaction causing immediate rash, facial/tongue/throat swelling, SOB or lightheadedness with hypotension: Unk Has  patient had a PCN reaction causing severe rash involving mucus membranes or skin necrosis: Unk Has patient had a PCN reaction that required hospitalization: Unk Has patient had a PCN reaction occurring within the last 10 years: No If all of the above answers are "NO", then may proceed with Cephalosporin use.    :   Her Current Medications Are:  Outpatient Encounter Medications as of 04/06/2021  Medication Sig  . acetaminophen (TYLENOL) 650 MG CR tablet Take 650 mg by mouth every 8 (eight) hours as needed for pain.  Marland Kitchen albuterol (PROVENTIL) (2.5 MG/3ML) 0.083% nebulizer solution Take 2.5 mg by nebulization every 6 (six) hours as needed for wheezing.  Marland Kitchen albuterol (VENTOLIN HFA) 108 (90 Base) MCG/ACT inhaler Inhale 1 puff into the lungs every 6 (six) hours as needed for wheezing or shortness of breath.  Delma Freeze ALLERGY 180 MG tablet Take 180 mg by mouth in the morning.  Marland Kitchen alum & mag hydroxide-simeth (MAALOX/MYLANTA) 200-200-20 MG/5ML suspension Take 30 mLs by mouth every 6 (six) hours as needed for indigestion,  heartburn or flatulence.  . artificial tears (LACRILUBE) OINT ophthalmic ointment Place into the left eye at bedtime.  . atorvastatin (LIPITOR) 20 MG tablet Take 20 mg by mouth daily.  . bisacodyl (DULCOLAX) 5 MG EC tablet Take 1 tablet (5 mg total) by mouth daily as needed for moderate constipation.  . carvedilol (COREG) 6.25 MG tablet Take 1 tablet (6.25 mg total) by mouth 2 (two) times daily with a meal.  . Cholecalciferol (VITAMIN D-3) 125 MCG (5000 UT) TABS Take 5,000 Units by mouth daily.   . Cyanocobalamin (VITAMIN B-12 PO) Take 500 mcg by mouth daily.   . folic acid (FOLVITE) 800 MCG tablet Take 800 mcg by mouth daily.   . gabapentin (NEURONTIN) 300 MG capsule Take 1 capsule (300 mg total) by mouth 2 (two) times daily.  . hydroxypropyl methylcellulose / hypromellose (ISOPTO TEARS / GONIOVISC) 2.5 % ophthalmic solution Place 1 drop into the left eye 3 (three) times daily.  .  imatinib (GLEEVEC) 400 MG tablet Take 1 tablet (400 mg total) by mouth daily. Take with meals and large glass of water.Caution:Chemotherapy  . losartan (COZAAR) 50 MG tablet Take 25 mg by mouth daily.  . ondansetron (ZOFRAN) 8 MG tablet Take 1 tablet (8 mg total) by mouth every 8 (eight) hours as needed for nausea or vomiting.  . pantoprazole (PROTONIX) 40 MG tablet TAKE 1 TABLET(40 MG) BY MOUTH TWICE DAILY (Patient taking differently: Take 40 mg by mouth in the morning and at bedtime.)  . SYNTHROID 50 MCG tablet Take 50 mcg by mouth daily before breakfast.  . trolamine salicylate (ASPERCREME) 10 % cream Apply 1 application topically as needed for muscle pain.  . vitamin E 180 MG (400 UNITS) capsule Take 400 Units by mouth daily.  . losartan (COZAAR) 25 MG tablet Take 1 tablet (25 mg total) by mouth daily.  . potassium chloride SA (KLOR-CON) 20 MEQ tablet TAKE 1 TABLET(20 MEQ) BY MOUTH DAILY (Patient not taking: Reported on 04/06/2021)  . predniSONE (DELTASONE) 20 MG tablet Take 20 mg by mouth. (Patient not taking: Reported on 04/06/2021)  . valACYclovir (VALTREX) 1000 MG tablet Take 1,000 mg by mouth. (Patient not taking: Reported on 04/06/2021)   No facility-administered encounter medications on file as of 04/06/2021.  :   Review of Systems:  Out of a complete 14 point review of systems, all are reviewed and negative with the exception of these symptoms as listed below:  Review of Systems  Neurological:       Pt here for eval on Bell's Palsy. Pt reports around 2 month ago she had a Bell's Palsy event on the left side of her face. Pt has struggled with numbness/tingling in the past but droopy mouth and unable to close eye are still present. Pt was treated with Valtrex and prednisone at the time of the event.     Objective:  Neurological Exam  Physical Exam Physical Examination:   Vitals:   04/06/21 1441  BP: (!) 154/82  Pulse: 100  SpO2: 97%    General Examination: The patient is a  very pleasant 87 y.o. female in no acute distress. She appears well-developed and well-nourished and well groomed.  She is situated in her transport chair.  HEENT: Normocephalic, atraumatic, pupils are equal, round and reactive to light, extraocular tracking is well-preserved, she has difficulty with the left eye closure.  She has mild ptosis bilaterally.  She is status post right cataract repaired, she has a left cataract.  Tympanic membranes are   clear bilaterally.  Ear canals are benign.  Hearing is mildly impaired.  Face is asymmetric, she has left facial droop which is quite significant, also forehead muscle weakness on the left.  Speech is slightly difficult to understand at times.  Generally speaking, airway examination reveals fairly benign findings, tongue does deviate slightly to the right because mouth does not open symmetrically.  No carotid bruits are noted.   Chest: Clear to auscultation without wheezing, rhonchi or crackles noted.  Heart: S1+S2+0, regular and normal without murmurs, rubs or gallops noted.   Abdomen: Soft, non-tender and non-distended with normal bowel sounds appreciated on auscultation.  Extremities: There is 2+ pitting edema in the distal lower extremities bilaterally, left a little worse than right.   Skin: Warm and dry without trophic changes noted.  Musculoskeletal: exam reveals some discomfort right leg.   Neurologically:  Mental status: The patient is awake, alert and oriented in all 4 spheres. Her immediate and remote memory, attention, language skills and fund of knowledge are appropriate. There is no evidence of aphasia, agnosia, apraxia or anomia. Speech is clear with normal prosody and enunciation. Thought process is linear. Mood is normal and affect is normal.  Cranial nerves II - XII are as described above under HEENT exam. In addition: shoulder shrug is normal with equal shoulder height noted. Motor exam: Normal bulk, strength and tone is noted. There  is no drift, tremor or rebound. Romberg is not tested for safety concerns.  Reflexes are 1+ in the upper extremities and diminished in the lower extremities.  Fine motor skills are generally fairly well-preserved in the upper and lower extremities.  Cerebellar testing: No dysmetria or intention tremor.  Sensory exam: intact to light touch in the upper and lower extremities.  Gait, station and balance: She is in her transport chair.  She did not bring her walker.  She has a 2 wheeled and a rolling walker at the house.  I did not deem it safe for her to walk without a walker.   Assessment and Plan:   In summary, Monique Lin is a very pleasant 87 y.o.-year old female with an underlying medical history of hypertension, hypothyroidism, anemia, bronchitis, gastrointestinal stromal tumor, and overweight state, who presents for consultation of her left-sided facial droop.  This happened in late February.  She has had a mild improvement in her facial palsy.  History and physical exam are in keeping with left-sided Bell's palsy.  She had observation overnight and scans including CT scan and brain MRI.  She has finished a course of Valtrex as well as prednisone.  She continues to use a protective eye patch on the left side as well as lubricant eyedrops and a thicker drop at night.  She has not seen her eye specialist in over 3 years.  She is encouraged to make an appointment to see Dr. Groat. She is advised that sometimes it takes several weeks and months to notice an improvement in the facial weakness, sometimes residual weakness can be permanent.  I would like to have her return for a recheck in this clinic to see one of our nurse practitioners in about 6 months.  In the meantime I would like to make a referral to physical therapy home health to see if therapeutic exercises could help facilitate improvement in her weakness.  She is advised to use the protective eye patch at night and protective lubrication in the  form of drops and/or ointment at night.  I answered all   their questions today and the patient and her daughter were in agreement with the plan.   , MD, PhD  

## 2021-04-06 NOTE — Patient Instructions (Signed)
It was nice to meet you.  Please continue to use your lubricating eyedrops and a thicker eyedrop or nighttime ointment, your pharmacist may be able to guide you as to what better to use overnight, thicker ointment stays in your eye a little longer and protects it from drying out.  Using eye patch overnight.  Please make an appointment to see Dr. Katy Fitch, your eye specialist to make sure that you have not had any damage to the conjunctiva or cornea of the left eye.  You may also want to discuss your left eye cataract.  Please follow-up routinely to see one of our nurse practitioners in 6 months.  I have made a referral to physical therapy through home health to see if they can help you with therapeutic exercises to the left side of your face.

## 2021-04-12 ENCOUNTER — Telehealth: Payer: Self-pay | Admitting: Neurology

## 2021-04-12 NOTE — Telephone Encounter (Signed)
Referral for home health sent to [facility] with [facility]. They are able to accept patient. Hoyle Sauer let me know that patient has been sent to scheduling and she will be contacted to arrange the first visit.  [place] phone: [phone]  Hoyle Sauer phone: [phone]

## 2021-04-19 NOTE — Telephone Encounter (Signed)
Received this notice from [place] at [facility]: "We have not been able to connect with her to arrange home health PT. I asked Hinton Dyer to let the Provider know and see if they had any suggestions."  I called patient to discuss.  No answer, left a voicemail asking her to call us back.  If patient calls back please encourage her to call [facility] for her physical therapy as ordered by Dr. Rexene Alberts.  [facility]'s phone number is [phone].

## 2021-04-22 ENCOUNTER — Inpatient Hospital Stay (HOSPITAL_BASED_OUTPATIENT_CLINIC_OR_DEPARTMENT_OTHER): Payer: Medicare Other | Admitting: Family

## 2021-04-22 ENCOUNTER — Other Ambulatory Visit: Payer: Self-pay | Admitting: *Deleted

## 2021-04-22 ENCOUNTER — Other Ambulatory Visit: Payer: Self-pay

## 2021-04-22 ENCOUNTER — Encounter: Payer: Self-pay | Admitting: Family

## 2021-04-22 ENCOUNTER — Inpatient Hospital Stay: Payer: Medicare Other | Attending: Hematology & Oncology

## 2021-04-22 VITALS — BP 173/70 | HR 112 | Temp 98.3°F | Resp 18 | Ht 63.0 in | Wt 178.1 lb

## 2021-04-22 DIAGNOSIS — D508 Other iron deficiency anemias: Secondary | ICD-10-CM | POA: Diagnosis not present

## 2021-04-22 DIAGNOSIS — C49A2 Gastrointestinal stromal tumor of stomach: Secondary | ICD-10-CM | POA: Diagnosis present

## 2021-04-22 DIAGNOSIS — G629 Polyneuropathy, unspecified: Secondary | ICD-10-CM | POA: Insufficient documentation

## 2021-04-22 DIAGNOSIS — R1110 Vomiting, unspecified: Secondary | ICD-10-CM | POA: Diagnosis not present

## 2021-04-22 DIAGNOSIS — C49A0 Gastrointestinal stromal tumor, unspecified site: Secondary | ICD-10-CM

## 2021-04-22 LAB — CBC WITH DIFFERENTIAL (CANCER CENTER ONLY)
Abs Immature Granulocytes: 0.02 10*3/uL (ref 0.00–0.07)
Basophils Absolute: 0 10*3/uL (ref 0.0–0.1)
Basophils Relative: 1 %
Eosinophils Absolute: 0.1 10*3/uL (ref 0.0–0.5)
Eosinophils Relative: 1 %
HCT: 29.8 % — ABNORMAL LOW (ref 36.0–46.0)
Hemoglobin: 9.9 g/dL — ABNORMAL LOW (ref 12.0–15.0)
Immature Granulocytes: 0 %
Lymphocytes Relative: 32 %
Lymphs Abs: 1.8 10*3/uL (ref 0.7–4.0)
MCH: 31.7 pg (ref 26.0–34.0)
MCHC: 33.2 g/dL (ref 30.0–36.0)
MCV: 95.5 fL (ref 80.0–100.0)
Monocytes Absolute: 0.5 10*3/uL (ref 0.1–1.0)
Monocytes Relative: 8 %
Neutro Abs: 3.2 10*3/uL (ref 1.7–7.7)
Neutrophils Relative %: 58 %
Platelet Count: 167 10*3/uL (ref 150–400)
RBC: 3.12 MIL/uL — ABNORMAL LOW (ref 3.87–5.11)
RDW: 18.3 % — ABNORMAL HIGH (ref 11.5–15.5)
WBC Count: 5.6 10*3/uL (ref 4.0–10.5)
nRBC: 0 % (ref 0.0–0.2)

## 2021-04-22 LAB — CMP (CANCER CENTER ONLY)
ALT: 7 U/L (ref 0–44)
AST: 17 U/L (ref 15–41)
Albumin: 3.5 g/dL (ref 3.5–5.0)
Alkaline Phosphatase: 62 U/L (ref 38–126)
Anion gap: 10 (ref 5–15)
BUN: 15 mg/dL (ref 8–23)
CO2: 22 mmol/L (ref 22–32)
Calcium: 9.7 mg/dL (ref 8.9–10.3)
Chloride: 109 mmol/L (ref 98–111)
Creatinine: 1.49 mg/dL — ABNORMAL HIGH (ref 0.44–1.00)
GFR, Estimated: 33 mL/min — ABNORMAL LOW (ref >60)
Glucose, Bld: 97 mg/dL (ref 70–99)
Potassium: 4.4 mmol/L (ref 3.5–5.1)
Sodium: 141 mmol/L (ref 135–145)
Total Bilirubin: 0.8 mg/dL (ref 0.3–1.2)
Total Protein: 6 g/dL — ABNORMAL LOW (ref 6.5–8.1)

## 2021-04-22 LAB — LACTATE DEHYDROGENASE: LDH: 206 U/L — ABNORMAL HIGH (ref 98–192)

## 2021-04-22 MED ORDER — IMATINIB MESYLATE 400 MG PO TABS: 400.0000 mg | ORAL_TABLET | Freq: Every day | ORAL | 3 refills | Status: AC

## 2021-04-22 NOTE — Progress Notes (Signed)
Hematology and Oncology Follow Up Visit  Monique Lin [id number] Dec 14, 1926 87 y.o. 04/22/2021   Principle Diagnosis:  Recurrent GIST of the stomach  Current Therapy:        Gleevec increased back up to 400 mg PO Q day starting 02/23/2021    Interim History:  Monique Lin is here today with her daughter for follow-up. She is tolerating the Harper well. She had 2 episodes of vomiting since we last saw her and feels this may be due to her small stomach vs the Gleevec.  Hgb 9.9, MCV 95, platelets 167 and WBC count 5.6.  No fever, chills, n/v, cough, rash, dizziness, SOB, chest pain, palpitations, abdominal pain or changes in bowel or bladder habits.  No blood loss noted. No bruising or petechiae.  She has swelling occasionally in her right ankle due to arthritis. She has occasional pain in the bottom of her right foot. Massaging helps relieve this.  Neuropathy in her hands is unchanged.  No falls or syncope to report. She has a cane, Rolator and wheelchair at home that she uses as needed.  She has maintained a good appetite and eats small meals throughout the day. She does her best to stay well hydrated. Her weight is 178 lbs.   ECOG Performance Status: 2 - Symptomatic, <50% confined to bed  Medications:  Allergies as of 04/22/2021      Reactions   Codeine Other (See Comments)   Stomach cramps   Penicillins Other (See Comments)   "Bad stomach cramps" Has patient had a PCN reaction causing immediate rash, facial/tongue/throat swelling, SOB or lightheadedness with hypotension: Unk Has patient had a PCN reaction causing severe rash involving mucus membranes or skin necrosis: Unk Has patient had a PCN reaction that required hospitalization: Unk Has patient had a PCN reaction occurring within the last 10 years: No If all of the above answers are "NO", then may proceed with Cephalosporin use.      Medication List       Accurate as of Apr 22, 2021  3:42 PM. If you have any questions, ask  your nurse or doctor.        STOP taking these medications   losartan 25 MG tablet Commonly known as: Cozaar Stopped by: Laverna Peace, NP   losartan 50 MG tablet Commonly known as: COZAAR Stopped by: Laverna Peace, NP   potassium chloride SA 20 MEQ tablet Commonly known as: KLOR-CON Stopped by: Laverna Peace, NP   predniSONE 20 MG tablet Commonly known as: DELTASONE Stopped by: Laverna Peace, NP   valACYclovir 1000 MG tablet Commonly known as: VALTREX Stopped by: Laverna Peace, NP     TAKE these medications   acetaminophen 650 MG CR tablet Commonly known as: TYLENOL Take 650 mg by mouth every 8 (eight) hours as needed for pain.   albuterol 108 (90 Base) MCG/ACT inhaler Commonly known as: VENTOLIN HFA Inhale 1 puff into the lungs every 6 (six) hours as needed for wheezing or shortness of breath.   albuterol (2.5 MG/3ML) 0.083% nebulizer solution Commonly known as: PROVENTIL Take 2.5 mg by nebulization every 6 (six) hours as needed for wheezing.   Allegra Allergy 180 MG tablet Generic drug: fexofenadine Take 180 mg by mouth in the morning.   alum & mag hydroxide-simeth 200-200-20 MG/5ML suspension Commonly known as: MAALOX/MYLANTA Take 30 mLs by mouth every 6 (six) hours as needed for indigestion, heartburn or flatulence.   artificial tears Oint ophthalmic ointment Commonly known as: Tool into  the left eye at bedtime.   atorvastatin 20 MG tablet Commonly known as: LIPITOR Take 20 mg by mouth daily.   bisacodyl 5 MG EC tablet Commonly known as: DULCOLAX Take 1 tablet (5 mg total) by mouth daily as needed for moderate constipation.   carvedilol 6.25 MG tablet Commonly known as: COREG Take 1 tablet (6.25 mg total) by mouth 2 (two) times daily with a meal.   folic acid 426 MCG tablet Commonly known as: FOLVITE Take 800 mcg by mouth daily.   gabapentin 300 MG capsule Commonly known as: NEURONTIN Take 1 capsule (300 mg total) by  mouth 2 (two) times daily.   hydroxypropyl methylcellulose / hypromellose 2.5 % ophthalmic solution Commonly known as: ISOPTO TEARS / GONIOVISC Place 1 drop into the left eye 3 (three) times daily.   imatinib 400 MG tablet Commonly known as: GLEEVEC Take 1 tablet (400 mg total) by mouth daily. Take with meals and large glass of water.Caution:Chemotherapy   ondansetron 8 MG tablet Commonly known as: ZOFRAN Take 1 tablet (8 mg total) by mouth every 8 (eight) hours as needed for nausea or vomiting.   pantoprazole 40 MG tablet Commonly known as: PROTONIX TAKE 1 TABLET(40 MG) BY MOUTH TWICE DAILY What changed: See the new instructions.   Synthroid 50 MCG tablet Generic drug: levothyroxine Take 50 mcg by mouth daily before breakfast.   trolamine salicylate 10 % cream Commonly known as: ASPERCREME Apply 1 application topically as needed for muscle pain.   VITAMIN B-12 PO Take 500 mcg by mouth daily.   Vitamin D-3 125 MCG (5000 UT) Tabs Take 5,000 Units by mouth daily.   vitamin E 180 MG (400 UNITS) capsule Take 400 Units by mouth daily.       Allergies:  Allergies  Allergen Reactions  . Codeine Other (See Comments)    Stomach cramps  . Penicillins Other (See Comments)    "Bad stomach cramps" Has patient had a PCN reaction causing immediate rash, facial/tongue/throat swelling, SOB or lightheadedness with hypotension: Unk Has patient had a PCN reaction causing severe rash involving mucus membranes or skin necrosis: Unk Has patient had a PCN reaction that required hospitalization: Unk Has patient had a PCN reaction occurring within the last 10 years: No If all of the above answers are "NO", then may proceed with Cephalosporin use.      Past Medical History, Surgical history, Social history, and Family History were reviewed and updated.  Review of Systems: All other 10 point review of systems is negative.   Physical Exam:  height is 5\' 3"  (1.6 m) and weight is 178 lb  1.9 oz (80.8 kg). Her oral temperature is 98.3 F (36.8 C). Her blood pressure is 173/70 (abnormal) and her pulse is 112 (abnormal). Her respiration is 18 and oxygen saturation is 98%.   Wt Readings from Last 3 Encounters:  04/22/21 178 lb 1.9 oz (80.8 kg)  04/06/21 167 lb (75.8 kg)  02/23/21 172 lb (78 kg)    Ocular: Sclerae unicteric, pupils equal, round and reactive to light Ear-nose-throat: Oropharynx clear, dentition fair Lymphatic: No cervical or supraclavicular adenopathy Lungs no rales or rhonchi, good excursion bilaterally Heart regular rate and rhythm, no murmur appreciated Abd soft, nontender, positive bowel sounds MSK no focal spinal tenderness, no joint edema Neuro: non-focal, well-oriented, appropriate affect Breasts: Deferred   Lab Results  Component Value Date   WBC 5.6 04/22/2021   HGB 9.9 (L) 04/22/2021   HCT 29.8 (L) 04/22/2021   MCV  95.5 04/22/2021   PLT 167 04/22/2021   Lab Results  Component Value Date   FERRITIN 122 02/23/2021   IRON 99 02/23/2021   TIBC 230 (L) 02/23/2021   UIBC 131 02/23/2021   IRONPCTSAT 43 (H) 02/23/2021   Lab Results  Component Value Date   RETICCTPCT 1.0 02/23/2021   RBC 3.12 (L) 04/22/2021   No results found for: KPAFRELGTCHN, LAMBDASER, KAPLAMBRATIO No results found for: IGGSERUM, IGA, IGMSERUM No results found for: Odetta Pink, SPEI   Chemistry      Component Value Date/Time   NA 141 04/22/2021 1439   K 4.4 04/22/2021 1439   CL 109 04/22/2021 1439   CO2 22 04/22/2021 1439   BUN 15 04/22/2021 1439   CREATININE 1.49 (H) 04/22/2021 1439      Component Value Date/Time   CALCIUM 9.7 04/22/2021 1439   ALKPHOS 62 04/22/2021 1439   AST 17 04/22/2021 1439   ALT 7 04/22/2021 1439   BILITOT 0.8 04/22/2021 1439      Impression and Plan: Monique Lin is a very pleasant 87 yo African American female with recurrent GIST of the stomach.  She is tolerating her current dose  of Gleevec and will continue this same regimen.  We will repeat a CT scan in 6 weeks with follow-up to re-evaluate her response to treatment.  They were encouraged to contact our office with any questions or concerns. We can certainly see her sooner if needed.   Laverna Peace, NP 5/20/20223:42 PM

## 2021-04-25 ENCOUNTER — Telehealth: Payer: Self-pay

## 2021-04-25 NOTE — Telephone Encounter (Signed)
appts mdae per 04/22/21 los, called sherry and she asked that I hold, I held 7+ minutes and then d/c call, called her back and she placed on hold again, appts made per los and she can view on my chart and may call back   Monique Lin

## 2021-06-08 ENCOUNTER — Encounter: Payer: Self-pay | Admitting: Hematology & Oncology

## 2021-06-08 ENCOUNTER — Ambulatory Visit (HOSPITAL_BASED_OUTPATIENT_CLINIC_OR_DEPARTMENT_OTHER)
Admission: RE | Admit: 2021-06-08 | Discharge: 2021-06-08 | Disposition: A | Discharge status: Home / Self Care | Payer: Medicare Other | Source: Ambulatory Visit | Attending: Family | Admitting: Family

## 2021-06-08 ENCOUNTER — Inpatient Hospital Stay: Payer: Medicare Other | Attending: Hematology & Oncology

## 2021-06-08 ENCOUNTER — Other Ambulatory Visit: Payer: Self-pay

## 2021-06-08 ENCOUNTER — Inpatient Hospital Stay (HOSPITAL_BASED_OUTPATIENT_CLINIC_OR_DEPARTMENT_OTHER): Payer: Medicare Other | Admitting: Hematology & Oncology

## 2021-06-08 ENCOUNTER — Other Ambulatory Visit: Payer: Self-pay | Admitting: Family

## 2021-06-08 VITALS — BP 158/64 | HR 96 | Temp 98.2°F | Resp 18 | Wt 183.0 lb

## 2021-06-08 DIAGNOSIS — C49A2 Gastrointestinal stromal tumor of stomach: Secondary | ICD-10-CM | POA: Insufficient documentation

## 2021-06-08 DIAGNOSIS — C49A0 Gastrointestinal stromal tumor, unspecified site: Secondary | ICD-10-CM | POA: Diagnosis not present

## 2021-06-08 DIAGNOSIS — D649 Anemia, unspecified: Secondary | ICD-10-CM | POA: Insufficient documentation

## 2021-06-08 DIAGNOSIS — D508 Other iron deficiency anemias: Secondary | ICD-10-CM

## 2021-06-08 DIAGNOSIS — N289 Disorder of kidney and ureter, unspecified: Secondary | ICD-10-CM | POA: Insufficient documentation

## 2021-06-08 LAB — CMP (CANCER CENTER ONLY)
ALT: 10 U/L (ref 0–44)
AST: 22 U/L (ref 15–41)
Albumin: 3.3 g/dL — ABNORMAL LOW (ref 3.5–5.0)
Alkaline Phosphatase: 57 U/L (ref 38–126)
Anion gap: 10 (ref 5–15)
BUN: 18 mg/dL (ref 8–23)
CO2: 20 mmol/L — ABNORMAL LOW (ref 22–32)
Calcium: 9 mg/dL (ref 8.9–10.3)
Chloride: 110 mmol/L (ref 98–111)
Creatinine: 1.82 mg/dL — ABNORMAL HIGH (ref 0.44–1.00)
GFR, Estimated: 26 mL/min — ABNORMAL LOW (ref >60)
Glucose, Bld: 87 mg/dL (ref 70–99)
Potassium: 4.2 mmol/L (ref 3.5–5.1)
Sodium: 140 mmol/L (ref 135–145)
Total Bilirubin: 0.5 mg/dL (ref 0.3–1.2)
Total Protein: 5.6 g/dL — ABNORMAL LOW (ref 6.5–8.1)

## 2021-06-08 LAB — CBC WITH DIFFERENTIAL (CANCER CENTER ONLY)
Abs Immature Granulocytes: 0.04 10*3/uL (ref 0.00–0.07)
Basophils Absolute: 0 10*3/uL (ref 0.0–0.1)
Basophils Relative: 0 %
Eosinophils Absolute: 0.1 10*3/uL (ref 0.0–0.5)
Eosinophils Relative: 1 %
HCT: 23.7 % — ABNORMAL LOW (ref 36.0–46.0)
Hemoglobin: 8.1 g/dL — ABNORMAL LOW (ref 12.0–15.0)
Immature Granulocytes: 1 %
Lymphocytes Relative: 28 %
Lymphs Abs: 1.3 10*3/uL (ref 0.7–4.0)
MCH: 32.8 pg (ref 26.0–34.0)
MCHC: 34.2 g/dL (ref 30.0–36.0)
MCV: 96 fL (ref 80.0–100.0)
Monocytes Absolute: 0.4 10*3/uL (ref 0.1–1.0)
Monocytes Relative: 9 %
Neutro Abs: 2.8 10*3/uL (ref 1.7–7.7)
Neutrophils Relative %: 61 %
Platelet Count: 150 10*3/uL (ref 150–400)
RBC: 2.47 MIL/uL — ABNORMAL LOW (ref 3.87–5.11)
RDW: 17.2 % — ABNORMAL HIGH (ref 11.5–15.5)
WBC Count: 4.5 10*3/uL (ref 4.0–10.5)
nRBC: 0 % (ref 0.0–0.2)

## 2021-06-08 LAB — RETICULOCYTES
Immature Retic Fract: 8.7 % (ref 2.3–15.9)
RBC.: 2.51 MIL/uL — ABNORMAL LOW (ref 3.87–5.11)
Retic Count, Absolute: 32.1 10*3/uL (ref 19.0–186.0)
Retic Ct Pct: 1.3 % (ref 0.4–3.1)

## 2021-06-08 LAB — LACTATE DEHYDROGENASE: LDH: 208 U/L — ABNORMAL HIGH (ref 98–192)

## 2021-06-08 NOTE — Progress Notes (Signed)
Hematology and Oncology Follow Up Visit  Monique Lin [id number] 1927-07-19 87 y.o. 06/08/2021   Principle Diagnosis:  Recurrent GIST of the stomach  Current Therapy:   Gleevec 400 mg p.o.q other day-to start on 01/31/2020 --increase back up to 400 mg p.o. q. day starting 02/23/2021      Interim History:  Monique Lin is in for follow-up.  Tomorrow is her birthday.  She will be 87 years old.  I am sure she will have a wonderful birthday.  She is quite anemic.  She says she is not bleeding.  Hemoglobin is 8.1.  I think were going to have to transfuse her.  She does have some mild renal insufficiency which could be also causing some problems.  We did do a CT scan on her today.  Thankfully, the CT scan showed that the tumor was really no bigger.  It may been a little bit smaller in size.  We cannot use contrast because of her renal function.  The tumor measured 7.2 x 5.4 cm.  Para she is still on the [place] and doing well with this.  She still has Bell's palsy.  She has eye patch on the left eye.  She still has had this for a few months.  Her appetite is doing okay.  She is not eating a lot of meat.  She does have quite a bit of swelling in her legs.  This could be from the anemia.  She has had no nausea or vomiting.  She has had no fever.  There is no cough.  Overall, I would say her performance status is probably ECOG 2-3.    Medications:  Current Outpatient Medications:    acetaminophen (TYLENOL) 650 MG CR tablet, Take 650 mg by mouth every 8 (eight) hours as needed for pain., Disp: , Rfl:    albuterol (PROVENTIL) (2.5 MG/3ML) 0.083% nebulizer solution, Take 2.5 mg by nebulization every 6 (six) hours as needed for wheezing., Disp: , Rfl:    albuterol (VENTOLIN HFA) 108 (90 Base) MCG/ACT inhaler, Inhale 1 puff into the lungs every 6 (six) hours as needed for wheezing or shortness of breath., Disp: , Rfl:    ALLEGRA ALLERGY 180 MG tablet, Take 180 mg by mouth in the morning., Disp: ,  Rfl:    alum & mag hydroxide-simeth (MAALOX/MYLANTA) 387-564-33 MG/5ML suspension, Take 30 mLs by mouth every 6 (six) hours as needed for indigestion, heartburn or flatulence., Disp: 355 mL, Rfl: 0   artificial tears (LACRILUBE) OINT ophthalmic ointment, Place into the left eye at bedtime., Disp: 1 g, Rfl: 1   atorvastatin (LIPITOR) 20 MG tablet, Take 20 mg by mouth daily., Disp: , Rfl:    bisacodyl (DULCOLAX) 5 MG EC tablet, Take 1 tablet (5 mg total) by mouth daily as needed for moderate constipation., Disp: 30 tablet, Rfl: 0   carvedilol (COREG) 6.25 MG tablet, Take 1 tablet (6.25 mg total) by mouth 2 (two) times daily with a meal., Disp: 14 tablet, Rfl: 0   Cholecalciferol (VITAMIN D-3) 125 MCG (5000 UT) TABS, Take 5,000 Units by mouth daily. , Disp: , Rfl:    Cyanocobalamin (VITAMIN B-12 PO), Take 500 mcg by mouth daily. , Disp: , Rfl:    folic acid (FOLVITE) 295 MCG tablet, Take 800 mcg by mouth daily. , Disp: , Rfl:    gabapentin (NEURONTIN) 300 MG capsule, Take 1 capsule (300 mg total) by mouth 2 (two) times daily., Disp: 180 capsule, Rfl: 1   hydroxypropyl methylcellulose /  hypromellose (ISOPTO TEARS / GONIOVISC) 2.5 % ophthalmic solution, Place 1 drop into the left eye 3 (three) times daily., Disp: 15 mL, Rfl: 12   imatinib (GLEEVEC) 400 MG tablet, Take 1 tablet (400 mg total) by mouth daily. Take with meals and large glass of water.Caution:Chemotherapy, Disp: 90 tablet, Rfl: 3   ondansetron (ZOFRAN) 8 MG tablet, Take 1 tablet (8 mg total) by mouth every 8 (eight) hours as needed for nausea or vomiting., Disp: 20 tablet, Rfl: 0   pantoprazole (PROTONIX) 40 MG tablet, TAKE 1 TABLET(40 MG) BY MOUTH TWICE DAILY (Patient taking differently: Take 40 mg by mouth in the morning and at bedtime.), Disp: 20 tablet, Rfl: 0   SYNTHROID 50 MCG tablet, Take 50 mcg by mouth daily before breakfast., Disp: , Rfl:    trolamine salicylate (ASPERCREME) 10 % cream, Apply 1 application topically as needed for  muscle pain., Disp: , Rfl:    vitamin E 180 MG (400 UNITS) capsule, Take 400 Units by mouth daily., Disp: , Rfl:   Allergies:  Allergies  Allergen Reactions   Codeine Other (See Comments)    Stomach cramps   Penicillins Other (See Comments)    "Bad stomach cramps" Has patient had a PCN reaction causing immediate rash, facial/tongue/throat swelling, SOB or lightheadedness with hypotension: Unk Has patient had a PCN reaction causing severe rash involving mucus membranes or skin necrosis: Unk Has patient had a PCN reaction that required hospitalization: Unk Has patient had a PCN reaction occurring within the last 10 years: No If all of the above answers are "NO", then may proceed with Cephalosporin use.      Past Medical History, Surgical history, Social history, and Family History were reviewed and updated.  Review of Systems: Review of Systems  Constitutional: Negative.   HENT:  Negative.    Eyes: Negative.   Respiratory:  Positive for wheezing.   Cardiovascular: Negative.   Gastrointestinal:  Positive for abdominal pain.  Endocrine: Negative.   Genitourinary: Negative.    Musculoskeletal: Negative.   Skin: Negative.   Neurological: Negative.   Hematological: Negative.   Psychiatric/Behavioral: Negative.     Physical Exam:  weight is 183 lb (83 kg). Her oral temperature is 98.2 F (36.8 C). Her blood pressure is 158/64 (abnormal) and her pulse is 96. Her respiration is 18 and oxygen saturation is 100%.   Wt Readings from Last 3 Encounters:  06/08/21 183 lb (83 kg)  04/22/21 178 lb 1.9 oz (80.8 kg)  04/06/21 167 lb (75.8 kg)    Physical Exam Vitals reviewed.  HENT:     Head: Normocephalic and atraumatic.  Eyes:     Pupils: Pupils are equal, round, and reactive to light.  Cardiovascular:     Rate and Rhythm: Normal rate and regular rhythm.     Heart sounds: Normal heart sounds.  Pulmonary:     Effort: Pulmonary effort is normal.     Breath sounds: Normal breath  sounds.  Abdominal:     General: Bowel sounds are normal.     Palpations: Abdomen is soft.  Musculoskeletal:        General: No tenderness or deformity. Normal range of motion.     Cervical back: Normal range of motion.  Lymphadenopathy:     Cervical: No cervical adenopathy.  Skin:    General: Skin is warm and dry.     Findings: No erythema or rash.  Neurological:     Mental Status: She is alert and oriented to person,  place, and time.  Psychiatric:        Behavior: Behavior normal.        Thought Content: Thought content normal.        Judgment: Judgment normal.     Lab Results  Component Value Date   WBC 4.5 06/08/2021   HGB 8.1 (L) 06/08/2021   HCT 23.7 (L) 06/08/2021   MCV 96.0 06/08/2021   PLT 150 06/08/2021     Chemistry      Component Value Date/Time   NA 140 06/08/2021 1358   K 4.2 06/08/2021 1358   CL 110 06/08/2021 1358   CO2 20 (L) 06/08/2021 1358   BUN 18 06/08/2021 1358   CREATININE 1.82 (H) 06/08/2021 1358      Component Value Date/Time   CALCIUM 9.0 06/08/2021 1358   ALKPHOS 57 06/08/2021 1358   AST 22 06/08/2021 1358   ALT 10 06/08/2021 1358   BILITOT 0.5 06/08/2021 1358      Impression and Plan: Ms. Belinsky is a 87 year old African-American female.  She really does not look her age.  She is still quite spunky.   I am so glad that she will be able to celebrate her birthday tomorrow.  I am sure her family have a nice celebration for her.  Are going to have to transfuse her.  I talked to her and her daughter about this.  We will do this on Friday.  We will give her 2 units of blood.  Will be interesting to see what her iron studies look like.  She does have some mild renal insufficiency.  We will check an erythropoietin level on her.  At least, the tumor is responding.  This is certainly a good news report.  I just hate that her hemoglobin is lower.  Her plaque and have to get her back a lot sooner than 3 months to see her.  I would like to  probably get her back in a month or so so we can monitor her anemia a little more closely.   Volanda Napoleon, MD 7/6/20225:04 PM

## 2021-06-09 ENCOUNTER — Other Ambulatory Visit: Payer: Self-pay | Admitting: *Deleted

## 2021-06-09 DIAGNOSIS — C49A0 Gastrointestinal stromal tumor, unspecified site: Secondary | ICD-10-CM

## 2021-06-09 DIAGNOSIS — D508 Other iron deficiency anemias: Secondary | ICD-10-CM

## 2021-06-09 LAB — FERRITIN: Ferritin: 338 ng/mL — ABNORMAL HIGH (ref 11–307)

## 2021-06-09 LAB — IRON AND TIBC
Iron: 95 ug/dL (ref 41–142)
Saturation Ratios: 74 % — ABNORMAL HIGH (ref 21–57)
TIBC: 129 ug/dL — ABNORMAL LOW (ref 236–444)
UIBC: 34 ug/dL — ABNORMAL LOW (ref 120–384)

## 2021-06-10 ENCOUNTER — Inpatient Hospital Stay: Payer: Medicare Other

## 2021-06-10 ENCOUNTER — Other Ambulatory Visit: Payer: Self-pay | Admitting: Hematology & Oncology

## 2021-06-10 ENCOUNTER — Other Ambulatory Visit: Payer: Self-pay

## 2021-06-10 ENCOUNTER — Other Ambulatory Visit: Payer: Self-pay | Admitting: *Deleted

## 2021-06-10 DIAGNOSIS — D508 Other iron deficiency anemias: Secondary | ICD-10-CM

## 2021-06-10 DIAGNOSIS — D649 Anemia, unspecified: Secondary | ICD-10-CM | POA: Diagnosis present

## 2021-06-10 DIAGNOSIS — C49A0 Gastrointestinal stromal tumor, unspecified site: Secondary | ICD-10-CM

## 2021-06-10 DIAGNOSIS — C49A2 Gastrointestinal stromal tumor of stomach: Secondary | ICD-10-CM | POA: Diagnosis present

## 2021-06-10 DIAGNOSIS — N289 Disorder of kidney and ureter, unspecified: Secondary | ICD-10-CM | POA: Diagnosis not present

## 2021-06-10 LAB — SAMPLE TO BLOOD BANK

## 2021-06-10 LAB — PREPARE RBC (CROSSMATCH)

## 2021-06-10 MED ORDER — DIPHENHYDRAMINE HCL 25 MG PO CAPS
ORAL_CAPSULE | ORAL | Status: AC
  Filled 2021-06-10: qty 1

## 2021-06-10 MED ORDER — ACETAMINOPHEN 325 MG PO TABS
650.0000 mg | ORAL_TABLET | Freq: Once | ORAL | Status: AC
  Administered 2021-06-10: 650 mg via ORAL

## 2021-06-10 MED ORDER — ACETAMINOPHEN 325 MG PO TABS
ORAL_TABLET | ORAL | Status: AC
  Filled 2021-06-10: qty 2

## 2021-06-10 MED ORDER — SODIUM CHLORIDE 0.9% IV SOLUTION
250.0000 mL | Freq: Once | INTRAVENOUS | Status: AC
  Administered 2021-06-10: 250 mL via INTRAVENOUS
  Filled 2021-06-10: qty 250

## 2021-06-10 MED ORDER — FUROSEMIDE 10 MG/ML IJ SOLN: 20.0000 mg | Freq: Once | INTRAMUSCULAR | Status: DC

## 2021-06-10 MED ORDER — DIPHENHYDRAMINE HCL 25 MG PO CAPS
25.0000 mg | ORAL_CAPSULE | Freq: Once | ORAL | Status: AC
Start: 2021-06-10 — End: 2021-06-10
  Administered 2021-06-10: 25 mg via ORAL

## 2021-06-12 LAB — TYPE AND SCREEN
ABO/RH(D): O POS
Antibody Screen: NEGATIVE
Unit division: 0
Unit division: 0

## 2021-06-12 LAB — BPAM RBC
Blood Product Expiration Date: 202208092359
Blood Product Expiration Date: 202208092359
ISSUE DATE / TIME: 202207081020
ISSUE DATE / TIME: 202207081020
Unit Type and Rh: 5100
Unit Type and Rh: 5100

## 2021-06-15 ENCOUNTER — Telehealth: Payer: Self-pay

## 2021-06-15 NOTE — Telephone Encounter (Signed)
Patients daughter, [name] called and asked if there was anything she can give to [facility] for her diarrhea. Called patient back and informed her she could try imodium over the counter and if it persists or worsens to give [place] a call back. She verbalized understanding and denies any other questions or concerns at this time.

## 2021-06-27 ENCOUNTER — Encounter (HOSPITAL_COMMUNITY): Payer: Self-pay

## 2021-06-27 ENCOUNTER — Inpatient Hospital Stay (HOSPITAL_COMMUNITY): Payer: Medicare Other

## 2021-06-27 ENCOUNTER — Inpatient Hospital Stay (HOSPITAL_COMMUNITY)
Admission: EM | Admit: 2021-06-27 | Discharge: 2021-07-04 | DRG: 871 | Disposition: E | Payer: Medicare Other | Attending: Pulmonary Disease | Admitting: Pulmonary Disease

## 2021-06-27 ENCOUNTER — Emergency Department (HOSPITAL_COMMUNITY): Payer: Medicare Other

## 2021-06-27 DIAGNOSIS — C49A0 Gastrointestinal stromal tumor, unspecified site: Secondary | ICD-10-CM | POA: Diagnosis present

## 2021-06-27 DIAGNOSIS — K7200 Acute and subacute hepatic failure without coma: Secondary | ICD-10-CM | POA: Diagnosis not present

## 2021-06-27 DIAGNOSIS — A4189 Other specified sepsis: Secondary | ICD-10-CM | POA: Diagnosis present

## 2021-06-27 DIAGNOSIS — D649 Anemia, unspecified: Secondary | ICD-10-CM | POA: Diagnosis present

## 2021-06-27 DIAGNOSIS — Z6832 Body mass index (BMI) 32.0-32.9, adult: Secondary | ICD-10-CM | POA: Diagnosis not present

## 2021-06-27 DIAGNOSIS — C169 Malignant neoplasm of stomach, unspecified: Secondary | ICD-10-CM | POA: Diagnosis present

## 2021-06-27 DIAGNOSIS — A419 Sepsis, unspecified organism: Secondary | ICD-10-CM

## 2021-06-27 DIAGNOSIS — Z885 Allergy status to narcotic agent status: Secondary | ICD-10-CM

## 2021-06-27 DIAGNOSIS — I5033 Acute on chronic diastolic (congestive) heart failure: Secondary | ICD-10-CM | POA: Diagnosis present

## 2021-06-27 DIAGNOSIS — A4159 Other Gram-negative sepsis: Secondary | ICD-10-CM | POA: Diagnosis present

## 2021-06-27 DIAGNOSIS — Z79890 Hormone replacement therapy (postmenopausal): Secondary | ICD-10-CM

## 2021-06-27 DIAGNOSIS — G9340 Encephalopathy, unspecified: Secondary | ICD-10-CM | POA: Diagnosis not present

## 2021-06-27 DIAGNOSIS — Z452 Encounter for adjustment and management of vascular access device: Secondary | ICD-10-CM

## 2021-06-27 DIAGNOSIS — D696 Thrombocytopenia, unspecified: Secondary | ICD-10-CM | POA: Diagnosis not present

## 2021-06-27 DIAGNOSIS — N184 Chronic kidney disease, stage 4 (severe): Secondary | ICD-10-CM | POA: Diagnosis present

## 2021-06-27 DIAGNOSIS — K8030 Calculus of bile duct with cholangitis, unspecified, without obstruction: Secondary | ICD-10-CM | POA: Diagnosis present

## 2021-06-27 DIAGNOSIS — U071 COVID-19: Secondary | ICD-10-CM | POA: Diagnosis present

## 2021-06-27 DIAGNOSIS — J1282 Pneumonia due to coronavirus disease 2019: Secondary | ICD-10-CM | POA: Diagnosis present

## 2021-06-27 DIAGNOSIS — R6521 Severe sepsis with septic shock: Secondary | ICD-10-CM | POA: Diagnosis present

## 2021-06-27 DIAGNOSIS — Z515 Encounter for palliative care: Secondary | ICD-10-CM

## 2021-06-27 DIAGNOSIS — J9601 Acute respiratory failure with hypoxia: Secondary | ICD-10-CM | POA: Diagnosis present

## 2021-06-27 DIAGNOSIS — B961 Klebsiella pneumoniae [K. pneumoniae] as the cause of diseases classified elsewhere: Secondary | ICD-10-CM | POA: Diagnosis present

## 2021-06-27 DIAGNOSIS — I130 Hypertensive heart and chronic kidney disease with heart failure and stage 1 through stage 4 chronic kidney disease, or unspecified chronic kidney disease: Secondary | ICD-10-CM | POA: Diagnosis present

## 2021-06-27 DIAGNOSIS — E6609 Other obesity due to excess calories: Secondary | ICD-10-CM | POA: Diagnosis present

## 2021-06-27 DIAGNOSIS — N189 Chronic kidney disease, unspecified: Secondary | ICD-10-CM | POA: Diagnosis not present

## 2021-06-27 DIAGNOSIS — E039 Hypothyroidism, unspecified: Secondary | ICD-10-CM | POA: Diagnosis present

## 2021-06-27 DIAGNOSIS — Z79899 Other long term (current) drug therapy: Secondary | ICD-10-CM

## 2021-06-27 DIAGNOSIS — G510 Bell's palsy: Secondary | ICD-10-CM | POA: Diagnosis present

## 2021-06-27 DIAGNOSIS — N170 Acute kidney failure with tubular necrosis: Secondary | ICD-10-CM | POA: Diagnosis present

## 2021-06-27 DIAGNOSIS — K8309 Other cholangitis: Secondary | ICD-10-CM | POA: Diagnosis not present

## 2021-06-27 DIAGNOSIS — Z1611 Resistance to penicillins: Secondary | ICD-10-CM | POA: Diagnosis present

## 2021-06-27 DIAGNOSIS — N179 Acute kidney failure, unspecified: Secondary | ICD-10-CM | POA: Diagnosis not present

## 2021-06-27 DIAGNOSIS — R0902 Hypoxemia: Secondary | ICD-10-CM

## 2021-06-27 DIAGNOSIS — Z4659 Encounter for fitting and adjustment of other gastrointestinal appliance and device: Secondary | ICD-10-CM

## 2021-06-27 DIAGNOSIS — Z66 Do not resuscitate: Secondary | ICD-10-CM | POA: Diagnosis present

## 2021-06-27 DIAGNOSIS — R069 Unspecified abnormalities of breathing: Secondary | ICD-10-CM

## 2021-06-27 DIAGNOSIS — R0602 Shortness of breath: Secondary | ICD-10-CM

## 2021-06-27 DIAGNOSIS — I4891 Unspecified atrial fibrillation: Secondary | ICD-10-CM | POA: Diagnosis not present

## 2021-06-27 DIAGNOSIS — Z880 Allergy status to penicillin: Secondary | ICD-10-CM

## 2021-06-27 DIAGNOSIS — I959 Hypotension, unspecified: Secondary | ICD-10-CM | POA: Diagnosis not present

## 2021-06-27 DIAGNOSIS — Z7189 Other specified counseling: Secondary | ICD-10-CM | POA: Diagnosis not present

## 2021-06-27 DIAGNOSIS — K8050 Calculus of bile duct without cholangitis or cholecystitis without obstruction: Secondary | ICD-10-CM

## 2021-06-27 DIAGNOSIS — I10 Essential (primary) hypertension: Secondary | ICD-10-CM | POA: Diagnosis present

## 2021-06-27 DIAGNOSIS — E785 Hyperlipidemia, unspecified: Secondary | ICD-10-CM | POA: Diagnosis present

## 2021-06-27 DIAGNOSIS — B9620 Unspecified Escherichia coli [E. coli] as the cause of diseases classified elsewhere: Secondary | ICD-10-CM | POA: Diagnosis present

## 2021-06-27 DIAGNOSIS — K7290 Hepatic failure, unspecified without coma: Secondary | ICD-10-CM | POA: Diagnosis present

## 2021-06-27 DIAGNOSIS — A4151 Sepsis due to Escherichia coli [E. coli]: Principal | ICD-10-CM | POA: Diagnosis present

## 2021-06-27 LAB — CBC
HCT: 29 % — ABNORMAL LOW (ref 36.0–46.0)
Hemoglobin: 9.8 g/dL — ABNORMAL LOW (ref 12.0–15.0)
MCH: 32.6 pg (ref 26.0–34.0)
MCHC: 33.8 g/dL (ref 30.0–36.0)
MCV: 96.3 fL (ref 80.0–100.0)
Platelets: UNDETERMINED 10*3/uL (ref 150–400)
RBC: 3.01 MIL/uL — ABNORMAL LOW (ref 3.87–5.11)
RDW: 16.7 % — ABNORMAL HIGH (ref 11.5–15.5)
WBC: 3.7 10*3/uL — ABNORMAL LOW (ref 4.0–10.5)
nRBC: 0.8 % — ABNORMAL HIGH (ref 0.0–0.2)

## 2021-06-27 LAB — BLOOD CULTURE ID PANEL (REFLEXED) - BCID2
A.calcoaceticus-baumannii: NOT DETECTED
Bacteroides fragilis: NOT DETECTED
CTX-M ESBL: NOT DETECTED
Candida albicans: NOT DETECTED
Candida auris: NOT DETECTED
Candida glabrata: NOT DETECTED
Candida krusei: NOT DETECTED
Candida parapsilosis: NOT DETECTED
Candida tropicalis: NOT DETECTED
Carbapenem resist OXA 48 LIKE: NOT DETECTED
Carbapenem resistance IMP: NOT DETECTED
Carbapenem resistance KPC: NOT DETECTED
Carbapenem resistance NDM: NOT DETECTED
Carbapenem resistance VIM: NOT DETECTED
Cryptococcus neoformans/gattii: NOT DETECTED
Enterobacter cloacae complex: NOT DETECTED
Enterobacterales: DETECTED — AB
Enterococcus Faecium: NOT DETECTED
Enterococcus faecalis: NOT DETECTED
Escherichia coli: DETECTED — AB
Haemophilus influenzae: NOT DETECTED
Klebsiella aerogenes: NOT DETECTED
Klebsiella oxytoca: NOT DETECTED
Klebsiella pneumoniae: DETECTED — AB
Listeria monocytogenes: NOT DETECTED
Neisseria meningitidis: NOT DETECTED
Proteus species: NOT DETECTED
Pseudomonas aeruginosa: NOT DETECTED
Salmonella species: NOT DETECTED
Serratia marcescens: NOT DETECTED
Staphylococcus aureus (BCID): NOT DETECTED
Staphylococcus epidermidis: NOT DETECTED
Staphylococcus lugdunensis: NOT DETECTED
Staphylococcus species: NOT DETECTED
Stenotrophomonas maltophilia: NOT DETECTED
Streptococcus agalactiae: NOT DETECTED
Streptococcus pneumoniae: NOT DETECTED
Streptococcus pyogenes: NOT DETECTED
Streptococcus species: NOT DETECTED

## 2021-06-27 LAB — BASIC METABOLIC PANEL
Anion gap: 5 (ref 5–15)
BUN: 23 mg/dL (ref 8–23)
CO2: 20 mmol/L — ABNORMAL LOW (ref 22–32)
Calcium: 8.2 mg/dL — ABNORMAL LOW (ref 8.9–10.3)
Chloride: 110 mmol/L (ref 98–111)
Creatinine, Ser: 1.98 mg/dL — ABNORMAL HIGH (ref 0.44–1.00)
GFR, Estimated: 23 mL/min — ABNORMAL LOW (ref >60)
Glucose, Bld: 99 mg/dL (ref 70–99)
Potassium: 3.9 mmol/L (ref 3.5–5.1)
Sodium: 135 mmol/L (ref 135–145)

## 2021-06-27 LAB — FERRITIN: Ferritin: 3548 ng/mL — ABNORMAL HIGH (ref 11–307)

## 2021-06-27 LAB — TROPONIN I (HIGH SENSITIVITY)
Troponin I (High Sensitivity): 127 ng/L (ref <18)
Troponin I (High Sensitivity): 147 ng/L (ref <18)
Troponin I (High Sensitivity): 168 ng/L (ref <18)

## 2021-06-27 LAB — LACTIC ACID, PLASMA: Lactic Acid, Venous: 1.6 mmol/L (ref 0.5–1.9)

## 2021-06-27 LAB — FIBRINOGEN: Fibrinogen: 175 mg/dL — ABNORMAL LOW (ref 210–475)

## 2021-06-27 LAB — CK: Total CK: 341 U/L — ABNORMAL HIGH (ref 38–234)

## 2021-06-27 LAB — LACTATE DEHYDROGENASE: LDH: 365 U/L — ABNORMAL HIGH (ref 98–192)

## 2021-06-27 LAB — D-DIMER, QUANTITATIVE: D-Dimer, Quant: 10.13 ug/mL-FEU — ABNORMAL HIGH (ref 0.00–0.50)

## 2021-06-27 LAB — RESP PANEL BY RT-PCR (FLU A&B, COVID) ARPGX2
Influenza A by PCR: NEGATIVE
Influenza B by PCR: NEGATIVE
SARS Coronavirus 2 by RT PCR: POSITIVE — AB

## 2021-06-27 LAB — C-REACTIVE PROTEIN: CRP: 2.7 mg/dL — ABNORMAL HIGH (ref <1.0)

## 2021-06-27 LAB — MRSA NEXT GEN BY PCR, NASAL: MRSA by PCR Next Gen: NOT DETECTED

## 2021-06-27 LAB — TRIGLYCERIDES: Triglycerides: 68 mg/dL (ref <150)

## 2021-06-27 LAB — GLUCOSE, CAPILLARY: Glucose-Capillary: 101 mg/dL — ABNORMAL HIGH (ref 70–99)

## 2021-06-27 LAB — BRAIN NATRIURETIC PEPTIDE: B Natriuretic Peptide: 347.5 pg/mL — ABNORMAL HIGH (ref 0.0–100.0)

## 2021-06-27 LAB — PROCALCITONIN: Procalcitonin: 4.54 ng/mL

## 2021-06-27 MED ORDER — ENOXAPARIN SODIUM 30 MG/0.3ML IJ SOSY
30.0000 mg | PREFILLED_SYRINGE | INTRAMUSCULAR | Status: DC
  Administered 2021-06-29: 30 mg via SUBCUTANEOUS
  Filled 2021-06-27: qty 0.3

## 2021-06-27 MED ORDER — PANTOPRAZOLE SODIUM 40 MG PO TBEC
40.0000 mg | DELAYED_RELEASE_TABLET | Freq: Two times a day (BID) | ORAL | Status: DC
  Administered 2021-06-27 – 2021-06-28 (×2): 40 mg via ORAL
  Filled 2021-06-27 (×2): qty 1

## 2021-06-27 MED ORDER — METHYLPREDNISOLONE SODIUM SUCC 40 MG IJ SOLR
40.0000 mg | Freq: Two times a day (BID) | INTRAMUSCULAR | Status: DC
  Administered 2021-06-27: 40 mg via INTRAVENOUS
  Filled 2021-06-27: qty 1

## 2021-06-27 MED ORDER — OXYCODONE HCL 5 MG PO TABS: 5.0000 mg | ORAL_TABLET | ORAL | Status: DC | PRN

## 2021-06-27 MED ORDER — ASCORBIC ACID 500 MG PO TABS
500.0000 mg | ORAL_TABLET | Freq: Every day | ORAL | Status: DC
  Administered 2021-06-27 – 2021-06-28 (×2): 500 mg via ORAL
  Filled 2021-06-27 (×3): qty 1

## 2021-06-27 MED ORDER — ACETAMINOPHEN 325 MG PO TABS: 650.0000 mg | ORAL_TABLET | Freq: Four times a day (QID) | ORAL | Status: DC | PRN

## 2021-06-27 MED ORDER — PREDNISONE 20 MG PO TABS: 50.0000 mg | ORAL_TABLET | Freq: Every day | ORAL | Status: DC

## 2021-06-27 MED ORDER — LEVOTHYROXINE SODIUM 50 MCG PO TABS
50.0000 ug | ORAL_TABLET | Freq: Every day | ORAL | Status: DC
  Administered 2021-06-28: 50 ug via ORAL
  Filled 2021-06-27: qty 1

## 2021-06-27 MED ORDER — SODIUM CHLORIDE 0.9 % IV BOLUS
500.0000 mL | Freq: Once | INTRAVENOUS | Status: AC
  Administered 2021-06-27: 500 mL via INTRAVENOUS

## 2021-06-27 MED ORDER — POLYETHYLENE GLYCOL 3350 17 G PO PACK: 17.0000 g | PACK | Freq: Every day | ORAL | Status: DC | PRN

## 2021-06-27 MED ORDER — HYPROMELLOSE (GONIOSCOPIC) 2.5 % OP SOLN
1.0000 [drp] | Freq: Three times a day (TID) | OPHTHALMIC | Status: DC
  Filled 2021-06-27: qty 15

## 2021-06-27 MED ORDER — CARVEDILOL 3.125 MG PO TABS: 6.2500 mg | ORAL_TABLET | Freq: Two times a day (BID) | ORAL | Status: DC

## 2021-06-27 MED ORDER — POLYVINYL ALCOHOL 1.4 % OP SOLN
1.0000 [drp] | Freq: Three times a day (TID) | OPHTHALMIC | Status: DC
  Administered 2021-06-27 – 2021-07-01 (×10): 1 [drp] via OPHTHALMIC
  Filled 2021-06-27: qty 15

## 2021-06-27 MED ORDER — SODIUM CHLORIDE 0.9 % IV SOLN
1.0000 g | INTRAVENOUS | Status: DC
  Administered 2021-06-27: 1 g via INTRAVENOUS
  Filled 2021-06-27: qty 10

## 2021-06-27 MED ORDER — ALBUTEROL SULFATE HFA 108 (90 BASE) MCG/ACT IN AERS: 1.0000 | INHALATION_SPRAY | Freq: Four times a day (QID) | RESPIRATORY_TRACT | Status: DC | PRN

## 2021-06-27 MED ORDER — SODIUM CHLORIDE 0.9 % IV SOLN
2.0000 g | INTRAVENOUS | Status: DC
  Administered 2021-06-28 – 2021-06-30 (×3): 2 g via INTRAVENOUS
  Filled 2021-06-27 (×4): qty 20

## 2021-06-27 MED ORDER — GABAPENTIN 300 MG PO CAPS: 300.0000 mg | ORAL_CAPSULE | Freq: Two times a day (BID) | ORAL | Status: DC

## 2021-06-27 MED ORDER — ONDANSETRON HCL 4 MG PO TABS: 4.0000 mg | ORAL_TABLET | Freq: Four times a day (QID) | ORAL | Status: DC | PRN

## 2021-06-27 MED ORDER — SODIUM CHLORIDE 0.9 % IV SOLN: 250.0000 mL | INTRAVENOUS | Status: DC | PRN

## 2021-06-27 MED ORDER — LACTATED RINGERS IV BOLUS
500.0000 mL | Freq: Once | INTRAVENOUS | Status: AC
  Administered 2021-06-27: 500 mL via INTRAVENOUS

## 2021-06-27 MED ORDER — ALBUTEROL SULFATE HFA 108 (90 BASE) MCG/ACT IN AERS
2.0000 | INHALATION_SPRAY | RESPIRATORY_TRACT | Status: DC | PRN
  Administered 2021-06-28 (×2): 2 via RESPIRATORY_TRACT
  Filled 2021-06-27 (×2): qty 6.7

## 2021-06-27 MED ORDER — SODIUM CHLORIDE 0.9 % IV SOLN
500.0000 mg | INTRAVENOUS | Status: DC
  Administered 2021-06-27: 500 mg via INTRAVENOUS
  Filled 2021-06-27 (×2): qty 500

## 2021-06-27 MED ORDER — ONDANSETRON HCL 4 MG/2ML IJ SOLN: 4.0000 mg | Freq: Four times a day (QID) | INTRAMUSCULAR | Status: DC | PRN

## 2021-06-27 MED ORDER — ARTIFICIAL TEARS OPHTHALMIC OINT
TOPICAL_OINTMENT | Freq: Every day | OPHTHALMIC | Status: DC
  Filled 2021-06-27 (×2): qty 3.5

## 2021-06-27 MED ORDER — HYDROCORTISONE NA SUCCINATE PF 100 MG IJ SOLR
50.0000 mg | Freq: Four times a day (QID) | INTRAMUSCULAR | Status: DC
  Administered 2021-06-27 – 2021-06-30 (×11): 50 mg via INTRAVENOUS
  Filled 2021-06-27 (×11): qty 2

## 2021-06-27 MED ORDER — PHENYLEPHRINE HCL-NACL 10-0.9 MG/250ML-% IV SOLN
25.0000 ug/min | INTRAVENOUS | Status: DC
  Administered 2021-06-28: 45 ug/min via INTRAVENOUS
  Administered 2021-06-28: 25 ug/min via INTRAVENOUS
  Filled 2021-06-27 (×2): qty 250

## 2021-06-27 MED ORDER — SODIUM CHLORIDE 0.9% FLUSH
3.0000 mL | Freq: Two times a day (BID) | INTRAVENOUS | Status: DC
  Administered 2021-06-27 – 2021-06-30 (×4): 3 mL via INTRAVENOUS

## 2021-06-27 MED ORDER — ZINC SULFATE 220 (50 ZN) MG PO CAPS
220.0000 mg | ORAL_CAPSULE | Freq: Every day | ORAL | Status: DC
  Administered 2021-06-27 – 2021-06-28 (×2): 220 mg via ORAL
  Filled 2021-06-27 (×3): qty 1

## 2021-06-27 MED ORDER — LORATADINE 10 MG PO TABS
10.0000 mg | ORAL_TABLET | Freq: Every day | ORAL | Status: DC
  Administered 2021-06-28: 10 mg via ORAL
  Filled 2021-06-27 (×2): qty 1

## 2021-06-27 MED ORDER — TECHNETIUM TO 99M ALBUMIN AGGREGATED
4.4000 | Freq: Once | INTRAVENOUS | Status: AC | PRN
  Administered 2021-06-27: 4.4 via INTRAVENOUS

## 2021-06-27 MED ORDER — ORAL CARE MOUTH RINSE
15.0000 mL | Freq: Two times a day (BID) | OROMUCOSAL | Status: DC
  Administered 2021-06-27 – 2021-07-01 (×8): 15 mL via OROMUCOSAL

## 2021-06-27 MED ORDER — BISACODYL 5 MG PO TBEC: 5.0000 mg | DELAYED_RELEASE_TABLET | Freq: Every day | ORAL | Status: DC | PRN

## 2021-06-27 MED ORDER — ATORVASTATIN CALCIUM 10 MG PO TABS
20.0000 mg | ORAL_TABLET | Freq: Every day | ORAL | Status: DC
  Administered 2021-06-28: 20 mg via ORAL
  Filled 2021-06-27 (×2): qty 2

## 2021-06-27 MED ORDER — GUAIFENESIN-DM 100-10 MG/5ML PO SYRP
10.0000 mL | ORAL_SOLUTION | ORAL | Status: DC | PRN
Start: 2021-06-27 — End: 2021-07-02

## 2021-06-27 MED ORDER — CHLORHEXIDINE GLUCONATE CLOTH 2 % EX PADS
6.0000 | MEDICATED_PAD | Freq: Every day | CUTANEOUS | Status: DC
  Administered 2021-06-27 – 2021-07-01 (×6): 6 via TOPICAL

## 2021-06-27 MED ORDER — SODIUM CHLORIDE 0.9 % IV SOLN: INTRAVENOUS | Status: DC

## 2021-06-27 MED ORDER — SODIUM CHLORIDE 0.9 % IV SOLN: 100.0000 mg | Freq: Every day | INTRAVENOUS | Status: DC

## 2021-06-27 MED ORDER — SODIUM CHLORIDE 0.9 % IV SOLN: 250.0000 mL | INTRAVENOUS | Status: DC

## 2021-06-27 MED ORDER — SODIUM CHLORIDE 0.9% FLUSH
3.0000 mL | INTRAVENOUS | Status: DC | PRN
  Administered 2021-06-30 – 2021-07-01 (×2): 3 mL via INTRAVENOUS

## 2021-06-27 MED ORDER — SODIUM CHLORIDE 0.9 % IV SOLN
200.0000 mg | Freq: Once | INTRAVENOUS | Status: AC
  Administered 2021-06-27: 200 mg via INTRAVENOUS
  Filled 2021-06-27: qty 40

## 2021-06-27 MED ORDER — MIDODRINE HCL 5 MG PO TABS
10.0000 mg | ORAL_TABLET | Freq: Three times a day (TID) | ORAL | Status: DC
  Administered 2021-06-27 – 2021-06-28 (×2): 10 mg via ORAL
  Filled 2021-06-27 (×4): qty 2

## 2021-06-27 MED ORDER — SODIUM CHLORIDE 0.9% FLUSH
3.0000 mL | Freq: Two times a day (BID) | INTRAVENOUS | Status: DC
  Administered 2021-06-27 – 2021-06-30 (×7): 3 mL via INTRAVENOUS

## 2021-06-27 MED ORDER — DOCUSATE SODIUM 100 MG PO CAPS
100.0000 mg | ORAL_CAPSULE | Freq: Two times a day (BID) | ORAL | Status: DC
  Administered 2021-06-27 – 2021-06-28 (×2): 100 mg via ORAL
  Filled 2021-06-27 (×2): qty 1

## 2021-06-27 MED ORDER — HYDROCOD POLST-CPM POLST ER 10-8 MG/5ML PO SUER: 5.0000 mL | Freq: Two times a day (BID) | ORAL | Status: DC | PRN

## 2021-06-27 MED ORDER — FLEET ENEMA 7-19 GM/118ML RE ENEM: 1.0000 | ENEMA | Freq: Once | RECTAL | Status: DC | PRN

## 2021-06-27 NOTE — ED Notes (Signed)
Attempted to call report

## 2021-06-27 NOTE — Progress Notes (Addendum)
[facility] Progress Note Patient Name: ODESSIA ASLESON DOB: 01-27-1927 MRN: [id number]   Date of Service  07/03/2021  HPI/Events of Note  Called daughter [name]) to clarify code status. She confirmed code status will be DNI/DNR. Pressors are acceptable if needed.  Patient continues to have borderline hypotension.   eICU Interventions  DNR order entered as above.  Ordered LR 500 mL bolus for borderline hypotension.   ADDENDUM 06/23/2021 11:30 PM  - No BP response after LR 500 mL bolus. MAP 58-63. Patient only has 1x PIV which RN does not feel is good enough to run peripheral pressors through. - RN will have IV team come and attempt a better PIV through which we can run peripheral phenylephrine to maintain MAP >\= 65 mmHg. - In interim, I have ordered midodrine 10mg  Q8H PO.  Intervention Category Major Interventions: End of life / care limitation discussion  Charlott Rakes 06/06/2021, 9:56 PM

## 2021-06-27 NOTE — ED Notes (Signed)
Pt family at bedside requesting change to pt code status, MD made aware of pt now Do Not Intubate/No Chest Compressions, DNR

## 2021-06-27 NOTE — Consult Note (Addendum)
NAME:  Monique Lin, MRN:  [id number], DOB:  1927/09/29, LOS: 0 ADMISSION DATE:  06/21/2021, CONSULTATION DATE:  7/25 REFERRING MD:  Dr. Lorin Mercy, CHIEF COMPLAINT:  Hypotension, COVID   History of Present Illness:  87 year old female with PMH as below, which is significant for gastrointestinal stromal tumor followed by Dr. Marin Olp on treatement with Gleevec, HTN, CKD, and hypothyroidism. She presented to [facility] ED 7/25 with complaints of malaise, body aches, and shortness of breath. Tested positive for COVID at home on the day of presentation. She is status post the initial series of vaccination in 2021. Upon arrival to the emergency department she was noted to be hypoxemic to the mid 80s on room air. She was placed on 2L nasal cannula and sats improved to the high 90s. COVID testing in the ED was confirmed positive. She was admitted to the hospitalists service. COVID labs were ordered including D. Dimer, which was positive. VQ scan was obtained with low probability of PE. As the day progressed she became hypotensive despite fluid bolus. PCCM consulted.   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   Significant Hospital Events: Including procedures, antibiotic start and stop dates in addition to other pertinent events   7/25 admit for COVID, hypotension  Interim History / Subjective:    Objective   Blood pressure (!) 90/41, pulse 95, temperature 99.3 F (37.4 C), temperature source Oral, resp. rate 20, weight 83 kg, SpO2 100 %.       No intake or output data in the 24 hours ending 06/14/2021 1707 Filed Weights   06/19/2021 1200  Weight: 83 kg    Examination: General: Obese elderly female HENT: /AT, PERRL, no JVD Lungs: Clear bilateral breath sounds Cardiovascular: RRR, no MRG Abdomen: Soft, non-tender, non-distended Extremities: NO  acute deformity. No edema.  Neuro: Somnolent but easily arouses. Confused.   Resolved Hospital Problem list     Assessment & Plan:   Acute hypoxemic respiratory failure: BNP and troponin mildly elevated. Pulmonary edema may be playing a role.  COVID-19 infection - Admit to ICU for close monitoring - Supplemental O2 to keep SpO2 > 92% - Hydrocortisone for COVID/Stress dose - Holding off on further COVID therapeutics - Trend LDH, Troponin, Ferritin, CRP - Hypotension limits diuresis - low threshold to DC CAP coverage - Dr. Ruthann Cancer has discussed [place] with family who are needing to think about it. Full code.   Hypotension: Lactic acid 1.6 - S/p 1L IVF with some improvement - Family OK with pressors if necessary - Hold coreg, losartan  AKI on CKD - Follow BMP after IVF hydration  Anemia: hemoglobin above baseline on admission - Trend hemoglobin  Gastrointestinal stromal tumor: followed by Dr. Marin Olp.  - On Gleevec, tumor stable by recent imaging.   Hypothyroid - Synthroid  Best Practice (right click and "Reselect all SmartList Selections" daily)   Diet/type: NPO DVT prophylaxis: prophylactic heparin  GI prophylaxis: PPI Lines: N/A Foley:  N/A Code Status:  full code Last date of multidisciplinary goals of care discussion [7/25 Dr Ruthann Cancer and Daughter]  Labs   CBC: Recent Labs  Lab 06/13/2021 0334  WBC 3.7*  HGB 9.8*  HCT 29.0*  MCV 96.3  PLT PLATELET CLUMPS NOTED ON SMEAR, UNABLE TO ESTIMATE    Basic Metabolic Panel: Recent Labs  Lab 06/25/2021 0334  NA 135  K 3.9  CL 110  CO2 20*  GLUCOSE 99  BUN 23  CREATININE 1.98*  CALCIUM 8.2*   GFR: Estimated Creatinine Clearance: 18.5 mL/min (A) (by C-G formula based on SCr of 1.98 mg/dL (H)). Recent Labs  Lab 06/26/2021 0334 06/07/2021 0902  PROCALCITON  --  4.54  WBC 3.7*  --   LATICACIDVEN  --  1.6    Liver Function Tests: No results for input(s): AST, ALT, ALKPHOS, BILITOT, PROT, ALBUMIN in the last  168 hours. No results for input(s): LIPASE, AMYLASE in the last 168 hours. No results for input(s): AMMONIA in the last 168 hours.  ABG    Component Value Date/Time   PHART 7.472 (H) 04/02/2018 0158   PCO2ART 23.8 (L) 04/02/2018 0158   PO2ART 191.0 (H) 04/02/2018 0158   HCO3 17.5 (L) 04/02/2018 0158   TCO2 18 (L) 04/02/2018 0158   ACIDBASEDEF 5.0 (H) 04/02/2018 0158   O2SAT 100.0 04/02/2018 0158     Coagulation Profile: No results for input(s): INR, PROTIME in the last 168 hours.  Cardiac Enzymes: No results for input(s): CKTOTAL, CKMB, CKMBINDEX, TROPONINI in the last 168 hours.  HbA1C: No results found for: HGBA1C  CBG: No results for input(s): GLUCAP in the last 168 hours.  Review of Systems:   Patient is encephalopathic and/or intubated. Therefore history has been obtained from chart review.   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   Surgical History:   Past Surgical History:  Procedure Laterality Date   BIOPSY  01/12/2020   Procedure: BIOPSY;  Surgeon: Ronald Lobo, MD;  Location: [facility];  Service: Endoscopy;;   ESOPHAGOGASTRODUODENOSCOPY (EGD) WITH PROPOFOL N/A 01/12/2020   Procedure: ESOPHAGOGASTRODUODENOSCOPY (EGD) WITH PROPOFOL;  Surgeon: Ronald Lobo, MD;  Location: [facility];  Service: Endoscopy;  Laterality: N/A;   ESOPHAGOGASTRODUODENOSCOPY (EGD) WITH PROPOFOL N/A 01/15/2020   Procedure: ESOPHAGOGASTRODUODENOSCOPY (EGD) WITH PROPOFOL;  Surgeon: Arta Silence, MD;  Location: [facility];  Service: Endoscopy;  Laterality: N/A;   EUS N/A 01/15/2020   Procedure: UPPER ENDOSCOPIC ULTRASOUND (EUS) RADIAL;  Surgeon: Arta Silence, MD;  Location: [facility];  Service: Endoscopy;  Laterality: N/A;   FINE NEEDLE ASPIRATION  01/15/2020   Procedure: FINE NEEDLE ASPIRATION (FNA) LINEAR;  Surgeon:  Arta Silence, MD;  Location: [facility];  Service: Endoscopy;;   IR CATHETER TUBE CHANGE  04/14/2018   IR THORACENTESIS ASP PLEURAL SPACE W/IMG GUIDE  04/05/2018   NASAL SINUS SURGERY     stomach tumor removal       Social History:   reports that she has never smoked. She has never used smokeless tobacco. She reports that she does not drink alcohol and does not use drugs.   Family History:  Her Family history is unknown by patient.   Allergies Allergies  Allergen Reactions   Codeine Other (See Comments)    Stomach cramps   Penicillins Other (See Comments)    "Bad stomach cramps" Has patient had a PCN reaction causing immediate rash, facial/tongue/throat swelling, SOB or lightheadedness with hypotension: Unk Has patient had a PCN reaction causing severe rash involving mucus membranes or skin necrosis: Unk Has patient had a PCN reaction that required hospitalization: Unk Has patient had a PCN reaction occurring within the last 10 years: No If all of the above answers are "NO", then may proceed with Cephalosporin use.       Home Medications  Prior  to Admission medications   Medication Sig Start Date End Date Taking? Authorizing Provider  acetaminophen (TYLENOL) 650 MG CR tablet Take 650 mg by mouth every 8 (eight) hours as needed for pain.   Yes [provider]  albuterol (PROVENTIL) (2.5 MG/3ML) 0.083% nebulizer solution Take 2.5 mg by nebulization in the morning, at noon, in the evening, and at bedtime.   Yes [provider]  albuterol (VENTOLIN HFA) 108 (90 Base) MCG/ACT inhaler Inhale 1 puff into the lungs every 6 (six) hours as needed for wheezing or shortness of breath.   Yes [provider]  ALLEGRA ALLERGY 180 MG tablet Take 180 mg by mouth in the morning.   Yes [provider]  alum & mag hydroxide-simeth (MAALOX/MYLANTA) 200-200-20 MG/5ML suspension Take 30 mLs by mouth every 6 (six) hours as needed for indigestion, heartburn or  flatulence. 12/22/19  Yes [name], MD  artificial tears (LACRILUBE) OINT ophthalmic ointment Place into the left eye at bedtime. 02/01/21  Yes Elodia Florence., MD  atorvastatin (LIPITOR) 20 MG tablet Take 20 mg by mouth daily.   Yes [provider]  bisacodyl (DULCOLAX) 5 MG EC tablet Take 1 tablet (5 mg total) by mouth daily as needed for moderate constipation. 12/22/19  Yes [name], MD  carvedilol (COREG) 6.25 MG tablet Take 1 tablet (6.25 mg total) by mouth 2 (two) times daily with a meal. 01/30/20  Yes Ennever, Rudell Cobb, MD  Cholecalciferol (VITAMIN D-3) 125 MCG (5000 UT) TABS Take 5,000 Units by mouth daily.    Yes [provider]  Cyanocobalamin (VITAMIN B-12 PO) Take 500 mcg by mouth daily.    Yes [provider]  folic acid (FOLVITE) 527 MCG tablet Take 800 mcg by mouth daily.    Yes [provider]  gabapentin (NEURONTIN) 300 MG capsule Take 1 capsule (300 mg total) by mouth 2 (two) times daily. 01/16/20  Yes Mercy Riding, MD  hydroxypropyl methylcellulose / hypromellose (ISOPTO TEARS / GONIOVISC) 2.5 % ophthalmic solution Place 1 drop into the left eye 3 (three) times daily. Patient taking differently: Place 1 drop into the left eye 3 (three) times daily. Per daughter, she also uses it in the right eye but not often. Just as needed. 02/01/21  Yes Elodia Florence., MD  imatinib (GLEEVEC) 400 MG tablet Take 1 tablet (400 mg total) by mouth daily. Take with meals and large glass of water.Caution:Chemotherapy 04/22/21  Yes Volanda Napoleon, MD  losartan (COZAAR) 25 MG tablet Take 25 mg by mouth daily.   Yes [provider]  ondansetron (ZOFRAN) 8 MG tablet Take 1 tablet (8 mg total) by mouth every 8 (eight) hours as needed for nausea or vomiting. 03/15/20  Yes Ennever, Rudell Cobb, MD  OVER THE COUNTER MEDICATION Apply 1 application topically See admin instructions. HempVana cream - as needed for knee and shoulder cream   Yes [provider]  pantoprazole (PROTONIX) 40 MG tablet TAKE 1 TABLET(40 MG) BY MOUTH TWICE DAILY Patient taking differently: Take 40 mg by mouth in the morning and at bedtime. 09/22/20  Yes Ennever, Rudell Cobb, MD  SYNTHROID 50 MCG tablet Take 50 mcg by mouth daily before breakfast.   Yes [provider]  trolamine salicylate (ASPERCREME) 10 % cream Apply 1 application topically as needed for muscle pain.   Yes [provider]  vitamin E 180 MG (400 UNITS) capsule Take 400 Units by mouth daily.   Yes [provider]  zinc gluconate 50 MG tablet Take 50 mg by mouth daily.   Yes [provider]     Critical care time: 35 mins     Georgann Housekeeper, AGACNP-BC [place]  See Amion for personal pager PCCM on call pager [phone] until 7pm. Please call Elink 7p-7a. [phone]  06/05/2021 6:05 PM

## 2021-06-27 NOTE — ED Triage Notes (Signed)
Pt reports that she has been having generalized body aches and tested covid+ today, denies SOB but speaks in short sentences

## 2021-06-27 NOTE — Progress Notes (Signed)
PHARMACY - PHYSICIAN COMMUNICATION CRITICAL VALUE ALERT - BLOOD CULTURE IDENTIFICATION (BCID)  Monique Lin is an 87 y.o. female who presented to [facility] on 06/20/2021 with a chief complaint of covid  Assessment:  4/4 BC positive. No resustance noted. Pt now comfort care  Name of physician (or Provider) Contacted:   Current antibiotics: rocephin and azithromycin Changes to prescribed antibiotics recommended:  Increase rocephin to 2gm IV q24 hours  Results for orders placed or performed during the hospital encounter of 06/20/2021  Blood Culture ID Panel (Reflexed) (Collected: 06/12/2021  8:36 AM)  Result Value Ref Range   Enterococcus faecalis NOT DETECTED NOT DETECTED   Enterococcus Faecium NOT DETECTED NOT DETECTED   Listeria monocytogenes NOT DETECTED NOT DETECTED   Staphylococcus species NOT DETECTED NOT DETECTED   Staphylococcus aureus (BCID) NOT DETECTED NOT DETECTED   Staphylococcus epidermidis NOT DETECTED NOT DETECTED   Staphylococcus lugdunensis NOT DETECTED NOT DETECTED   Streptococcus species NOT DETECTED NOT DETECTED   Streptococcus agalactiae NOT DETECTED NOT DETECTED   Streptococcus pneumoniae NOT DETECTED NOT DETECTED   Streptococcus pyogenes NOT DETECTED NOT DETECTED   A.calcoaceticus-baumannii NOT DETECTED NOT DETECTED   Bacteroides fragilis NOT DETECTED NOT DETECTED   Enterobacterales DETECTED (A) NOT DETECTED   Enterobacter cloacae complex NOT DETECTED NOT DETECTED   Escherichia coli DETECTED (A) NOT DETECTED   Klebsiella aerogenes NOT DETECTED NOT DETECTED   Klebsiella oxytoca NOT DETECTED NOT DETECTED   Klebsiella pneumoniae DETECTED (A) NOT DETECTED   Proteus species NOT DETECTED NOT DETECTED   Salmonella species NOT DETECTED NOT DETECTED   Serratia marcescens NOT DETECTED NOT DETECTED   Haemophilus influenzae NOT DETECTED NOT DETECTED   Neisseria meningitidis NOT DETECTED NOT DETECTED   Pseudomonas aeruginosa NOT DETECTED NOT DETECTED   Stenotrophomonas  maltophilia NOT DETECTED NOT DETECTED   Candida albicans NOT DETECTED NOT DETECTED   Candida auris NOT DETECTED NOT DETECTED   Candida glabrata NOT DETECTED NOT DETECTED   Candida krusei NOT DETECTED NOT DETECTED   Candida parapsilosis NOT DETECTED NOT DETECTED   Candida tropicalis NOT DETECTED NOT DETECTED   Cryptococcus neoformans/gattii NOT DETECTED NOT DETECTED   CTX-M ESBL NOT DETECTED NOT DETECTED   Carbapenem resistance IMP NOT DETECTED NOT DETECTED   Carbapenem resistance KPC NOT DETECTED NOT DETECTED   Carbapenem resistance NDM NOT DETECTED NOT DETECTED   Carbapenem resist OXA 48 LIKE NOT DETECTED NOT DETECTED   Carbapenem resistance VIM NOT DETECTED NOT DETECTED    Beverlee Nims 06/30/2021  11:25 PM

## 2021-06-27 NOTE — ED Notes (Signed)
Patient transported to CT 

## 2021-06-27 NOTE — Progress Notes (Signed)
[facility] Progress Note Patient Name: Monique Lin DOB: 1927/01/09 MRN: [id number]   Date of Service  06/04/2021  HPI/Events of Note  63F admitted with dyspnea due to COVID-19 infection. She is on 2L [place] saturating 100%. On exam via camera, patient is sleeping. Her vitals are notable for BP 98/47 (borderline hypotension). She received 1L IVF bolus a short while ago, and is receiving mIVF at 75 cc/hr now.   eICU Interventions  # COVID-19 infection: CXR relatively clear. - Steroids, supplemental O2 as needed. - Also being treated with CTX/azithromycin to cover for CAP, though low suspicion for this.  # Borderline Hypotension: - Lactate not elevated. Making UOP. Received 1L fluid bolus already. Holding home antihypertensives. CTM closely.   # CKD: Reduced dose Lovenox ppx.  DVT PPX: Lovenox 30mg  [place] Q24H GI PPX: Protonix (home med)     Intervention Category Evaluation Type: New Patient Evaluation  Charlott Rakes 07/03/2021, 8:36 PM

## 2021-06-27 NOTE — ED Notes (Signed)
Dr Lorin Mercy aware of pt's BP trending down.

## 2021-06-27 NOTE — ED Notes (Signed)
Dr Lorin Mercy has communicated with family concerning pt's condition. Family is at bedside at this time. Family asking to speak with Dr Lorin Mercy, Dr Lorin Mercy notified and will speak with family again as soon as she is able

## 2021-06-27 NOTE — Progress Notes (Signed)
VQ scan negative for PE.   However, her BP is dropping.  She has been given a bolus without improvement.  I called and discussed with the family.   I have asked them to come in and they are en route.  Meanwhile, I offered comfort care vs. Ongoing current management vs. PCCM consultation for possibly more aggressive management.  They have requested that I consult PCCM for CVL placement and pressors, if indicated.  Will continue to bolus while awaiting PCCM input - currently at 1L.   Carlyon Shadow, M.D.

## 2021-06-27 NOTE — H&P (Signed)
History and Physical    Monique Lin CLE:[id number] DOB: [birth year] DOA: 06/04/2021  PCP: [name], MD Consultants:  Marin Olp - oncology Patient coming from:  Home - lives with daughter; NOK: Daughter, Delphia Kaylor, [phone]  Chief Complaint: SOB, COVID  HPI: Monique Lin is a 87 y.o. female with medical history significant of GIST (2021); HTN; stage 4 CKD; and hypothyroidism presenting with SOB, COVID. She is somnolent and minimally able to answer questions.  Her daughter reports that she was very achy, had a headache.  No obvious breathing issues.  She has been weak.  She had a blood transfusion 2-3 weeks ago, on 7/8.  She did not sleep much yesterday at all, but she does have trouble sleeping.  Her legs have been weak and it has been hard for her to stand up, gradually worsening in the last 3-4 months.      ED Course: COVID - 2-3 days of illness, mild SOB.  Daughter + yesterday, patient + today.  Sat in ED 5-6 hours.  85% on RA, improved on 2L [place] O2.  Appears comfortable now.  CXR clear.  PCR +.    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 ([place]) 03/10/2020   Hypertension    Hypothyroidism    Leg pain    Stomach tumor (benign)     Past Surgical History:  Procedure Laterality Date   BIOPSY  01/12/2020   Procedure: BIOPSY;  Surgeon: Ronald Lobo, MD;  Location: [facility];  Service: Endoscopy;;   ESOPHAGOGASTRODUODENOSCOPY (EGD) WITH PROPOFOL N/A 01/12/2020   Procedure: ESOPHAGOGASTRODUODENOSCOPY (EGD) WITH PROPOFOL;  Surgeon: Ronald Lobo, MD;  Location[facility] ENDOSCOPY;  Service: Endoscopy;  Laterality: N/A;   ESOPHAGOGASTRODUODENOSCOPY (EGD) WITH PROPOFOL N/A 01/15/2020   Procedure:  ESOPHAGOGASTRODUODENOSCOPY (EGD) WITH PROPOFOL;  Surgeon: Arta Silence, MD;  Location: [facility];  Service: Endoscopy;  Laterality: N/A;   EUS N/A 01/15/2020   Procedure: UPPER ENDOSCOPIC ULTRASOUND (EUS) RADIAL;  Surgeon: Arta Silence, MD;  Location: [facility];  Service: Endoscopy;  Laterality: N/A;   FINE NEEDLE ASPIRATION  01/15/2020   Procedure: FINE NEEDLE ASPIRATION (FNA) LINEAR;  Surgeon: Arta Silence, MD;  Location: [facility];  Service: Endoscopy;;   IR CATHETER TUBE CHANGE  04/14/2018   IR THORACENTESIS ASP PLEURAL SPACE W/IMG GUIDE  04/05/2018   NASAL SINUS SURGERY     stomach tumor removal      Social History   Socioeconomic History   Marital status: Widowed    Spouse name: Not on file   Number of children: Not on file   Years of education: Not on file   Highest education level: Not on file  Occupational History   Not on file  Tobacco Use   Smoking status: Never   Smokeless tobacco: Never  Vaping Use   Vaping Use: Never used  Substance and Sexual Activity   Alcohol use: No   Drug use: No   Sexual activity: Not Currently  Other Topics Concern   Not on file  Social History Narrative   Not on file   Social Determinants of Health   Financial Resource Strain: Not on file  Food Insecurity: Not on file  Transportation Needs: Not on file  Physical Activity: Not  on file  Stress: Not on file  Social Connections: Not on file  Intimate Partner Violence: Not on file    Allergies  Allergen Reactions   Codeine Other (See Comments)    Stomach cramps   Penicillins Other (See Comments)    "Bad stomach cramps" Has patient had a PCN reaction causing immediate rash, facial/tongue/throat swelling, SOB or lightheadedness with hypotension: Unk Has patient had a PCN reaction causing severe rash involving mucus membranes or skin necrosis: Unk Has patient had a PCN reaction that required hospitalization: Unk Has patient had a PCN reaction occurring within the last  10 years: No If all of the above answers are "NO", then may proceed with Cephalosporin use.      Family History  Family history unknown: Yes    Prior to Admission medications   Medication Sig Start Date End Date Taking? Authorizing Provider  acetaminophen (TYLENOL) 650 MG CR tablet Take 650 mg by mouth every 8 (eight) hours as needed for pain.    [provider]  albuterol (PROVENTIL) (2.5 MG/3ML) 0.083% nebulizer solution Take 2.5 mg by nebulization every 6 (six) hours as needed for wheezing.    [provider]  albuterol (VENTOLIN HFA) 108 (90 Base) MCG/ACT inhaler Inhale 1 puff into the lungs every 6 (six) hours as needed for wheezing or shortness of breath.    [provider]  ALLEGRA ALLERGY 180 MG tablet Take 180 mg by mouth in the morning.    [provider]  alum & mag hydroxide-simeth (MAALOX/MYLANTA) 200-200-20 MG/5ML suspension Take 30 mLs by mouth every 6 (six) hours as needed for indigestion, heartburn or flatulence. 12/22/19   [name], MD  artificial tears (LACRILUBE) OINT ophthalmic ointment Place into the left eye at bedtime. 02/01/21   Elodia Florence., MD  atorvastatin (LIPITOR) 20 MG tablet Take 20 mg by mouth daily.    [provider]  bisacodyl (DULCOLAX) 5 MG EC tablet Take 1 tablet (5 mg total) by mouth daily as needed for moderate constipation. 12/22/19   [name], MD  carvedilol (COREG) 6.25 MG tablet Take 1 tablet (6.25 mg total) by mouth 2 (two) times daily with a meal. 01/30/20   Ennever, Rudell Cobb, MD  Cholecalciferol (VITAMIN D-3) 125 MCG (5000 UT) TABS Take 5,000 Units by mouth daily.     [provider]  Cyanocobalamin (VITAMIN B-12 PO) Take 500 mcg by mouth daily.     [provider]  folic acid (FOLVITE) 244 MCG tablet Take 800 mcg by mouth daily.     [provider]  gabapentin (NEURONTIN) 300 MG capsule Take 1 capsule (300 mg total) by mouth 2 (two) times daily. 01/16/20    Mercy Riding, MD  hydroxypropyl methylcellulose / hypromellose (ISOPTO TEARS / GONIOVISC) 2.5 % ophthalmic solution Place 1 drop into the left eye 3 (three) times daily. 02/01/21   Elodia Florence., MD  imatinib (GLEEVEC) 400 MG tablet Take 1 tablet (400 mg total) by mouth daily. Take with meals and large glass of water.Caution:Chemotherapy 04/22/21   Volanda Napoleon, MD  ondansetron (ZOFRAN) 8 MG tablet Take 1 tablet (8 mg total) by mouth every 8 (eight) hours as needed for nausea or vomiting. 03/15/20   Volanda Napoleon, MD  pantoprazole (PROTONIX) 40 MG tablet TAKE 1 TABLET(40 MG) BY MOUTH TWICE DAILY Patient taking differently: Take 40 mg by mouth in the morning and at bedtime. 09/22/20   Volanda Napoleon, MD  SYNTHROID 50  MCG tablet Take 50 mcg by mouth daily before breakfast.    [provider]  trolamine salicylate (ASPERCREME) 10 % cream Apply 1 application topically as needed for muscle pain.    [provider]  vitamin E 180 MG (400 UNITS) capsule Take 400 Units by mouth daily.    [provider]    Physical Exam: Vitals:   06/26/2021 0915 06/10/2021 0930 06/21/2021 0945 06/16/2021 1000  BP: 139/65 (!) 127/46 (!) 105/45   Pulse: (!) 110 (!) 107 (!) 104 (!) 101  Resp: 20 20 17 16   Temp:      TempSrc:      SpO2: 93% (!) 89% (!) 87% 98%     General:  Appears ill but is in NAD Eyes:   EOMI, normal lids, iris; L eye patch due to chronic Bells palsy ENT:  grossly normal hearing, lips & tongue, mmm Neck:  no LAD, masses or thyromegaly Cardiovascular:  RR with mild tachycardia, no m/r/g. 2-3+ LE edema.  Respiratory:   CTA bilaterally with no wheezes/rales/rhonchi.  Normal respiratory effort. Abdomen:  soft, NT, ND Skin:  no rash or induration seen on limited exam Musculoskeletal:  grossly normal tone BUE/BLE, good ROM, no bony abnormality Lower extremity:  Symmetric 2-3+ LE edema.  Limited foot exam with no ulcerations.  2+ distal pulses. Psychiatric:   somnolent mood and affect, speech sparse but appropriate, AOx1 Neurologic:  Unable to effectively perform    Radiological Exams on Admission: Independently reviewed - see discussion in A/P where applicable  DG Chest 1 View  Result Date: 06/22/2021 CLINICAL DATA:  Shortness of breath EXAM: CHEST  1 VIEW COMPARISON:  05/03/2020 radiography and 06/08/2021 chest CT FINDINGS: Overall large lung volumes with streaky density behind the heart and at the medial right base. No edema, effusion, or pneumothorax. Likely normal heart size given technique. IMPRESSION: Atelectasis at the bases, also seen on chest CT 06/08/2021. No interval abnormality. Electronically Signed   By: Monte Fantasia M.D.   On: 06/20/2021 04:27    EKG: Independently reviewed.  NSR with rate 89; nonspecific ST changes with no evidence of acute ischemia   Labs on Admission: I have personally reviewed the available labs and imaging studies at the time of the admission.  Pertinent labs:   BUN 23/Creatinine 1.98/GFR 23 BNP 347.5 LDH 365 HS troponin 127 Lactate 1.6 WBC 3.7 D-dimer 10.13 Fibrinogen 175 Ferritin 3548 CRP 2.7 Procalcitonin 4.54 D-dimer 10.13 COVID POSITIVE   Assessment/Plan Active Problems:   * No active hospital problems. *    Acute respiratory failure with hypoxia due to COVID-19 PNA -Patient with presenting with generalized myalgias and weakness at home with + COVID test; noted to have hypoxia while in the ER  -She does not have a usual home O2 requirement and is currently requiring 2L [place] O2 -COVID POSITIVE -The patient has comorbidities which may increase the risk for ARDS/MODS including: age, HTN, obesity, immunosuppression, CKD -Pertinent labs concerning for COVID include leukopenia; increased BUN/Creatinine; increased LDH; markedly elevated D-dimer (>>1); markedly increased ferritin; elevated CRP (not >7); increased fibrinogen -CXR clear currently but concern for early disease not showing up on  CXR -With significant D-dimer elevation, needs PE r/o; cannot get CTA due to renal dysfunction so will order STAT VQ scan -Will treat with broad-spectrum antibiotics given procalcitonin >0.5.   -Will admit for further evaluation, close monitoring, and treatment -Monitor on telemetry x at least 24 hours -At this time, will attempt to avoid use of aerosolized  medications and use HFAs instead -Will check daily labs including BMP with Mag, Phos; LFTs; CBC with differential; CRP; ferritin; fibrinogen; D-dimer -Will order steroids and Remdesivir (pharmacy consult) given +COVID test, +CXR, and hypoxia <94% on room air -If the patient shows clinical deterioration, consider transfer to ICU with PCCM consultation -Consider IL-6 agonist (Actemra) and/or JAK inhibitor (baricitinib) if the patient does not stabilize on current treatment or if the patient has marked clinical decompensation; the patient does not appear to require this treatment at this time.  She appears to not be a candidate for either medication because of her active treatment with Gleevec.  However, the family would want everything done should she worsen and so alternative agents would need to be considered as a salvage agent.  Pharmacy is researching her best options at this time. -Will attempt to maintain euvolemia to a net negative fluid status -PT/OT consults -Encourage mobilization/ambulation as much as possible -Patient was seen wearing full PPE including: gown, gloves, head cover, N95, and face shield; donning and doffing was in compliance with current standards.  GIST -Followed by Dr. Marin Olp -Taking Gleevec - will hold, as this may increase her risk of severe infection (immunocompromise); it also may preclude her from getting other COVID treatments -Tumor appears to be stable by imaging -Recurrent anemia, transfusing as needed; does not appear to currently need  HTN -Continue Coreg -Hold Cozaar in the setting of advanced  CKD  Bell's palsy -h/o Bell's palsy -Patches her left eye because bright lights bother it -Continue eye care as per home regimen  Stage 4 CKD -Has been worsening throughout 2022 - GFR has gradually decreased from 45 to 23 -Will hold Cozaar, consider stopping -There likely needs to be ongoing discussion with family about this issue, as she does not appear to be a reasonable candidate for HD (minimally mobile)  Hypothyroidism -Continue Synthroid at current dose for now   Elevated troponin -Likely associated with demand ischemia -Will trend  HLD -Continue Lipitor  Obesity -Body mass index is 32.41 kg/m.  -Weight loss should be encouraged  Goals of care -Family reports that patient is full code -With her limited options for treatment of covid acutely in conjunction with her chronically worsening renal function, she is at high risk of a poor outcome -Palliative care consult requested    Level of care: Telemetry Medical DVT prophylaxis:  Lovenox  Code Status:  Full - confirmed with family Family Communication: None present; I spoke with the patient's daughter by telephone. Disposition Plan:  The patient is from: home  Anticipated d/c is to: home without [facility] services once her respiratory issues have been resolved.  She may require home O2 at the time of discharge.  Anticipated d/c date will depend on clinical response to treatment, likely between 3 days (with completion of outpatient Remdesivir treatment) and 5 days  Patient is currently: acutely ill Consults called: PT/OT  Admission status: Admit - It is my clinical opinion that admission to [facility] is reasonable and necessary because of the expectation that this patient will require hospital care that crosses at least 2 midnights to treat this condition based on the medical complexity of the problems presented.  Given the aforementioned information, the predictability of an adverse outcome is felt to be significant.       Karmen Bongo MD [facility]   How to contact the [facility] Attending or Consulting provider [place] or covering provider during after hours [place], for this patient?  Check the  care team in [facility] and look for a) attending/consulting [place] provider listed and b) the [facility] team listed Log into [email or web address] and use 's universal password to access. If you do not have the password, please contact the hospital operator. Locate the [facility] provider you are looking for under [facility] and page to a number that you can be directly reached. If you still have difficulty reaching the provider, please page the [facility] (Director on Call) for the Hospitalists listed on amion for assistance.   06/09/2021, 10:46 AM

## 2021-06-27 NOTE — ED Provider Notes (Signed)
[facility] [place] EMERGENCY DEPARTMENT Provider Note   CSN: [id number] Arrival date & time: 06/10/2021  1791     History Chief Complaint  Patient presents with   Covid Positive    Monique Lin is a 87 y.o. female.  87 year old female with prior medical history as detailed below presents for evaluation.  Patient with reported COVID-19 symptoms.  Patient reports symptoms ongoing for the last 2 to 3 days.  Patient reportedly tested positive for COVID with an antigen test today.  Patient's daughter a Ms. Tamura, confirms the details of the history.  The daughter also tested positive for COVID today as well.  The patient apparently has been complaining of malaise, body aches, and mild shortness of breath.  The patient is status post 2 doses of Pfizer vaccine in 2021.  The patient did not receive a booster.  The history is provided by the patient, medical records and a relative.  Illness Location:  Malaise, fatigue, body aches, shortness of breath, suspected COVID Severity:  Moderate Onset quality:  Gradual Duration:  3 days Timing:  Constant Progression:  Worsening Chronicity:  New     Past Medical History:  Diagnosis Date   Anemia    Bronchitis    Gastrointestinal stromal tumor (GIST) associated with mutation in KIT gene ([place]) 03/10/2020   Hypertension    Hypothyroidism    Leg pain    Stomach tumor (benign)     Patient Active Problem List   Diagnosis Date Noted   Hypertension 02/01/2021   Bell's palsy 02/01/2021   Facial droop    Gastrointestinal stromal tumor (GIST) associated with mutation in KIT gene ([place]) 03/10/2020   Pressure injury of skin 01/14/2020   Palliative care by specialist    Goals of care, counseling/discussion    Transaminitis    Acute gallstone pancreatitis 01/09/2020   Acute renal failure (ARF) ([place]) 01/09/2020   Essential hypertension 01/09/2020   Hypothyroidism 01/09/2020   Acute GI bleeding 12/11/2019   CAP (community  acquired pneumonia)    Empyema ([place])    Pleural effusion, left    Left lower lobe pneumonia 04/01/2018   Bronchitis     Past Surgical History:  Procedure Laterality Date   BIOPSY  01/12/2020   Procedure: BIOPSY;  Surgeon: Ronald Lobo, MD;  Location: [facility];  Service: Endoscopy;;   ESOPHAGOGASTRODUODENOSCOPY (EGD) WITH PROPOFOL N/A 01/12/2020   Procedure: ESOPHAGOGASTRODUODENOSCOPY (EGD) WITH PROPOFOL;  Surgeon: Ronald Lobo, MD;  Location: [facility];  Service: Endoscopy;  Laterality: N/A;   ESOPHAGOGASTRODUODENOSCOPY (EGD) WITH PROPOFOL N/A 01/15/2020   Procedure: ESOPHAGOGASTRODUODENOSCOPY (EGD) WITH PROPOFOL;  Surgeon: Arta Silence, MD;  Location: [facility];  Service: Endoscopy;  Laterality: N/A;   EUS N/A 01/15/2020   Procedure: UPPER ENDOSCOPIC ULTRASOUND (EUS) RADIAL;  Surgeon: Arta Silence, MD;  Location: [facility];  Service: Endoscopy;  Laterality: N/A;   FINE NEEDLE ASPIRATION  01/15/2020   Procedure: FINE NEEDLE ASPIRATION (FNA) LINEAR;  Surgeon: Arta Silence, MD;  Location: [facility];  Service: Endoscopy;;   IR CATHETER TUBE CHANGE  04/14/2018   IR THORACENTESIS ASP PLEURAL SPACE W/IMG GUIDE  04/05/2018   NASAL SINUS SURGERY     stomach tumor removal       OB History   No obstetric history on file.     Family History  Family history unknown: Yes    Social History   Tobacco Use   Smoking status: Never   Smokeless tobacco: Never  Vaping Use   Vaping Use: Never used  Substance Use Topics   Alcohol use: No   Drug use: No    Home Medications Prior to Admission medications   Medication Sig Start Date End Date Taking? Authorizing Provider  acetaminophen (TYLENOL) 650 MG CR tablet Take 650 mg by mouth every 8 (eight) hours as needed for pain.    [provider]  albuterol (PROVENTIL) (2.5 MG/3ML) 0.083% nebulizer solution Take 2.5 mg by nebulization every 6 (six) hours as needed for wheezing.    [provider]  albuterol  (VENTOLIN HFA) 108 (90 Base) MCG/ACT inhaler Inhale 1 puff into the lungs every 6 (six) hours as needed for wheezing or shortness of breath.    [provider]  ALLEGRA ALLERGY 180 MG tablet Take 180 mg by mouth in the morning.    [provider]  alum & mag hydroxide-simeth (MAALOX/MYLANTA) 200-200-20 MG/5ML suspension Take 30 mLs by mouth every 6 (six) hours as needed for indigestion, heartburn or flatulence. 12/22/19   [name], MD  artificial tears (LACRILUBE) OINT ophthalmic ointment Place into the left eye at bedtime. 02/01/21   Elodia Florence., MD  atorvastatin (LIPITOR) 20 MG tablet Take 20 mg by mouth daily.    [provider]  bisacodyl (DULCOLAX) 5 MG EC tablet Take 1 tablet (5 mg total) by mouth daily as needed for moderate constipation. 12/22/19   [name], MD  carvedilol (COREG) 6.25 MG tablet Take 1 tablet (6.25 mg total) by mouth 2 (two) times daily with a meal. 01/30/20   Ennever, Rudell Cobb, MD  Cholecalciferol (VITAMIN D-3) 125 MCG (5000 UT) TABS Take 5,000 Units by mouth daily.     [provider]  Cyanocobalamin (VITAMIN B-12 PO) Take 500 mcg by mouth daily.     [provider]  folic acid (FOLVITE) 678 MCG tablet Take 800 mcg by mouth daily.     [provider]  gabapentin (NEURONTIN) 300 MG capsule Take 1 capsule (300 mg total) by mouth 2 (two) times daily. 01/16/20   Mercy Riding, MD  hydroxypropyl methylcellulose / hypromellose (ISOPTO TEARS / GONIOVISC) 2.5 % ophthalmic solution Place 1 drop into the left eye 3 (three) times daily. 02/01/21   Elodia Florence., MD  imatinib (GLEEVEC) 400 MG tablet Take 1 tablet (400 mg total) by mouth daily. Take with meals and large glass of water.Caution:Chemotherapy 04/22/21   Volanda Napoleon, MD  ondansetron (ZOFRAN) 8 MG tablet Take 1 tablet (8 mg total) by mouth every 8 (eight) hours as needed for nausea or vomiting. 03/15/20   Volanda Napoleon, MD  pantoprazole  (PROTONIX) 40 MG tablet TAKE 1 TABLET(40 MG) BY MOUTH TWICE DAILY Patient taking differently: Take 40 mg by mouth in the morning and at bedtime. 09/22/20   Volanda Napoleon, MD  SYNTHROID 50 MCG tablet Take 50 mcg by mouth daily before breakfast.    [provider]  trolamine salicylate (ASPERCREME) 10 % cream Apply 1 application topically as needed for muscle pain.    [provider]  vitamin E 180 MG (400 UNITS) capsule Take 400 Units by mouth daily.    [provider]    Allergies    Codeine and Penicillins  Review of Systems   Review of Systems  All other systems reviewed and are negative.  Physical Exam Updated Vital Signs BP 139/65   Pulse (!) 110   Temp 99.3 F (37.4 C) (Oral)   Resp 20   SpO2 93%   Physical  Exam Vitals and nursing note reviewed.  Constitutional:      General: She is not in acute distress.    Appearance: Normal appearance. She is well-developed.  HENT:     Head: Normocephalic and atraumatic.  Eyes:     Conjunctiva/sclera: Conjunctivae normal.     Pupils: Pupils are equal, round, and reactive to light.  Cardiovascular:     Rate and Rhythm: Normal rate and regular rhythm.     Heart sounds: Normal heart sounds.  Pulmonary:     Effort: Pulmonary effort is normal. No respiratory distress.     Breath sounds: Normal breath sounds.  Abdominal:     General: There is no distension.     Palpations: Abdomen is soft.     Tenderness: There is no abdominal tenderness.  Musculoskeletal:        General: No deformity. Normal range of motion.     Cervical back: Normal range of motion and neck supple.  Skin:    General: Skin is warm and dry.  Neurological:     General: No focal deficit present.     Mental Status: She is alert and oriented to person, place, and time.    ED Results / Procedures / Treatments   Labs (all labs ordered are listed, but only abnormal results are displayed) Labs Reviewed  RESP PANEL BY RT-PCR (FLU A&B,  COVID) ARPGX2 - Abnormal; Notable for the following components:      Result Value   SARS Coronavirus 2 by RT PCR POSITIVE (*)    All other components within normal limits  CBC - Abnormal; Notable for the following components:   WBC 3.7 (*)    RBC 3.01 (*)    Hemoglobin 9.8 (*)    HCT 29.0 (*)    RDW 16.7 (*)    nRBC 0.8 (*)    All other components within normal limits  BASIC METABOLIC PANEL - Abnormal; Notable for the following components:   CO2 20 (*)    Creatinine, Ser 1.98 (*)    Calcium 8.2 (*)    GFR, Estimated 23 (*)    All other components within normal limits  D-DIMER, QUANTITATIVE - Abnormal; Notable for the following components:   D-Dimer, Quant 10.13 (*)    All other components within normal limits  FIBRINOGEN - Abnormal; Notable for the following components:   Fibrinogen 175 (*)    All other components within normal limits  BRAIN NATRIURETIC PEPTIDE - Abnormal; Notable for the following components:   B Natriuretic Peptide 347.5 (*)    All other components within normal limits  CULTURE, BLOOD (ROUTINE X 2)  CULTURE, BLOOD (ROUTINE X 2)  LACTIC ACID, PLASMA  PROCALCITONIN  LACTATE DEHYDROGENASE  FERRITIN  C-REACTIVE PROTEIN  TRIGLYCERIDES  TROPONIN I (HIGH SENSITIVITY)  TROPONIN I (HIGH SENSITIVITY)    EKG EKG Interpretation  Date/Time:  Monday June 27 2021 03:33:29 EDT Ventricular Rate:  89 PR Interval:  136 QRS Duration: 62 QT Interval:  326 QTC Calculation: 396 R Axis:   44 Text Interpretation: Normal sinus rhythm Low voltage QRS Cannot rule out Anterior infarct , age undetermined Abnormal ECG Confirmed by Dene Gentry [phone]) on 06/30/2021 8:26:46 AM  Radiology DG Chest 1 View  Result Date: 06/23/2021 CLINICAL DATA:  Shortness of breath EXAM: CHEST  1 VIEW COMPARISON:  05/03/2020 radiography and 06/08/2021 chest CT FINDINGS: Overall large lung volumes with streaky density behind the heart and at the medial right base. No edema, effusion, or  pneumothorax. Likely  normal heart size given technique. IMPRESSION: Atelectasis at the bases, also seen on chest CT 06/08/2021. No interval abnormality. Electronically Signed   By: Monte Fantasia M.D.   On: 06/30/2021 04:27    Procedures Procedures   Medications Ordered in ED Medications - No data to display  ED Course  I have reviewed the triage vital signs and the nursing notes.  Pertinent labs & imaging results that were available during my care of the patient were reviewed by me and considered in my medical decision making (see chart for details).    MDM Rules/Calculators/A&P                           MDM  MSE complete  TRIXIE MACLAREN was evaluated in Emergency Department on 06/13/2021 for the symptoms described in the history of present illness. She was evaluated in the context of the global COVID-19 pandemic, which necessitated consideration that the patient might be at risk for infection with the SARS-CoV-2 virus that causes COVID-19. Institutional protocols and algorithms that pertain to the evaluation of patients at risk for COVID-19 are in a state of rapid change based on information released by regulatory bodies including the CDC and federal and state organizations. These policies and algorithms were followed during the patient's care in the ED.  Patient presents with complaint of malaise, fatigue, and mild shortness of breath.  Patient is noted to be hypoxic on room air into the mid 80s upon placement into an exam room.  Supplemental O2 at 2 L via nasal cannula improves her saturations into the upper 90s.  She appears comfortable.  COVID test is positive.  Patient would benefit from admission for further treatment.  Hospitalist services [facility]) aware of case and will evaluate for admission.    Final Clinical Impression(s) / ED Diagnoses Final diagnoses:  SOB (shortness of breath)    Rx / DC Orders ED Discharge Orders     None        Valarie Merino,  MD 06/14/2021 1054

## 2021-06-27 NOTE — Plan of Care (Signed)
  Problem: Health Behavior/Discharge Planning: Goal: Ability to manage health-related needs will improve Outcome: Progressing   

## 2021-06-28 ENCOUNTER — Inpatient Hospital Stay (HOSPITAL_COMMUNITY): Payer: Medicare Other

## 2021-06-28 DIAGNOSIS — D696 Thrombocytopenia, unspecified: Secondary | ICD-10-CM

## 2021-06-28 DIAGNOSIS — Z7189 Other specified counseling: Secondary | ICD-10-CM

## 2021-06-28 DIAGNOSIS — N189 Chronic kidney disease, unspecified: Secondary | ICD-10-CM

## 2021-06-28 DIAGNOSIS — N184 Chronic kidney disease, stage 4 (severe): Secondary | ICD-10-CM

## 2021-06-28 DIAGNOSIS — D649 Anemia, unspecified: Secondary | ICD-10-CM

## 2021-06-28 DIAGNOSIS — C49A0 Gastrointestinal stromal tumor, unspecified site: Secondary | ICD-10-CM

## 2021-06-28 DIAGNOSIS — A4151 Sepsis due to Escherichia coli [E. coli]: Principal | ICD-10-CM

## 2021-06-28 DIAGNOSIS — I959 Hypotension, unspecified: Secondary | ICD-10-CM

## 2021-06-28 DIAGNOSIS — J9601 Acute respiratory failure with hypoxia: Secondary | ICD-10-CM

## 2021-06-28 DIAGNOSIS — E039 Hypothyroidism, unspecified: Secondary | ICD-10-CM

## 2021-06-28 DIAGNOSIS — U071 COVID-19: Secondary | ICD-10-CM | POA: Diagnosis not present

## 2021-06-28 DIAGNOSIS — R6521 Severe sepsis with septic shock: Secondary | ICD-10-CM

## 2021-06-28 DIAGNOSIS — K7200 Acute and subacute hepatic failure without coma: Secondary | ICD-10-CM

## 2021-06-28 HISTORY — PX: IR PERCUTANEOUS TRANSHEPATIC CHOLANGIOGRAM: IMG6042

## 2021-06-28 LAB — COMPREHENSIVE METABOLIC PANEL
ALT: 109 U/L — ABNORMAL HIGH (ref 0–44)
AST: 221 U/L — ABNORMAL HIGH (ref 15–41)
Albumin: 2.1 g/dL — ABNORMAL LOW (ref 3.5–5.0)
Alkaline Phosphatase: 354 U/L — ABNORMAL HIGH (ref 38–126)
Anion gap: 7 (ref 5–15)
BUN: 25 mg/dL — ABNORMAL HIGH (ref 8–23)
CO2: 20 mmol/L — ABNORMAL LOW (ref 22–32)
Calcium: 8.1 mg/dL — ABNORMAL LOW (ref 8.9–10.3)
Chloride: 114 mmol/L — ABNORMAL HIGH (ref 98–111)
Creatinine, Ser: 2.19 mg/dL — ABNORMAL HIGH (ref 0.44–1.00)
GFR, Estimated: 21 mL/min — ABNORMAL LOW (ref >60)
Glucose, Bld: 90 mg/dL (ref 70–99)
Potassium: 4.1 mmol/L (ref 3.5–5.1)
Sodium: 141 mmol/L (ref 135–145)
Total Bilirubin: 1.9 mg/dL — ABNORMAL HIGH (ref 0.3–1.2)
Total Protein: 4.5 g/dL — ABNORMAL LOW (ref 6.5–8.1)

## 2021-06-28 LAB — CBC WITH DIFFERENTIAL/PLATELET
Abs Immature Granulocytes: 0.57 10*3/uL — ABNORMAL HIGH (ref 0.00–0.07)
Basophils Absolute: 0 10*3/uL (ref 0.0–0.1)
Basophils Relative: 0 %
Eosinophils Absolute: 0 10*3/uL (ref 0.0–0.5)
Eosinophils Relative: 0 %
HCT: 27.2 % — ABNORMAL LOW (ref 36.0–46.0)
Hemoglobin: 9.3 g/dL — ABNORMAL LOW (ref 12.0–15.0)
Immature Granulocytes: 3 %
Lymphocytes Relative: 3 %
Lymphs Abs: 0.5 10*3/uL — ABNORMAL LOW (ref 0.7–4.0)
MCH: 32.2 pg (ref 26.0–34.0)
MCHC: 34.2 g/dL (ref 30.0–36.0)
MCV: 94.1 fL (ref 80.0–100.0)
Monocytes Absolute: 0.3 10*3/uL (ref 0.1–1.0)
Monocytes Relative: 1 %
Neutro Abs: 17.2 10*3/uL — ABNORMAL HIGH (ref 1.7–7.7)
Neutrophils Relative %: 93 %
Platelets: 81 10*3/uL — ABNORMAL LOW (ref 150–400)
RBC: 2.89 MIL/uL — ABNORMAL LOW (ref 3.87–5.11)
RDW: 17.2 % — ABNORMAL HIGH (ref 11.5–15.5)
Smear Review: DECREASED
WBC: 18.5 10*3/uL — ABNORMAL HIGH (ref 4.0–10.5)
nRBC: 0 % (ref 0.0–0.2)

## 2021-06-28 LAB — PROTIME-INR
INR: 1.8 — ABNORMAL HIGH (ref 0.8–1.2)
Prothrombin Time: 20.8 seconds — ABNORMAL HIGH (ref 11.4–15.2)

## 2021-06-28 LAB — GLUCOSE, CAPILLARY
Glucose-Capillary: 79 mg/dL (ref 70–99)
Glucose-Capillary: 99 mg/dL (ref 70–99)
Glucose-Capillary: 84 mg/dL (ref 70–99)
Glucose-Capillary: 94 mg/dL (ref 70–99)
Glucose-Capillary: 86 mg/dL (ref 70–99)

## 2021-06-28 LAB — PHOSPHORUS: Phosphorus: 5 mg/dL — ABNORMAL HIGH (ref 2.5–4.6)

## 2021-06-28 LAB — FERRITIN: Ferritin: 2069 ng/mL — ABNORMAL HIGH (ref 11–307)

## 2021-06-28 LAB — ECHOCARDIOGRAM LIMITED
Area-P 1/2: 3.79 cm2
Calc EF: 54.8 %
S' Lateral: 2.8 cm
Single Plane A2C EF: 58.2 %
Single Plane A4C EF: 50.7 %
Weight: 3030 oz

## 2021-06-28 LAB — MAGNESIUM: Magnesium: 1.4 mg/dL — ABNORMAL LOW (ref 1.7–2.4)

## 2021-06-28 LAB — LIPASE, BLOOD: Lipase: 26 U/L (ref 11–51)

## 2021-06-28 LAB — C-REACTIVE PROTEIN: CRP: 11.5 mg/dL — ABNORMAL HIGH (ref <1.0)

## 2021-06-28 LAB — D-DIMER, QUANTITATIVE: D-Dimer, Quant: 9.1 ug/mL-FEU — ABNORMAL HIGH (ref 0.00–0.50)

## 2021-06-28 MED ORDER — MIDAZOLAM HCL 2 MG/2ML IJ SOLN
INTRAMUSCULAR | Status: AC
  Filled 2021-06-28: qty 2

## 2021-06-28 MED ORDER — MIDAZOLAM HCL 2 MG/2ML IJ SOLN
INTRAMUSCULAR | Status: AC
  Filled 2021-06-28: qty 4

## 2021-06-28 MED ORDER — SODIUM CHLORIDE 0.9 % IV SOLN
100.0000 mg | Freq: Every day | INTRAVENOUS | Status: AC
  Administered 2021-06-28 – 2021-07-01 (×4): 100 mg via INTRAVENOUS
  Filled 2021-06-28 (×4): qty 20

## 2021-06-28 MED ORDER — MAGNESIUM SULFATE 4 GM/100ML IV SOLN
4.0000 g | Freq: Once | INTRAVENOUS | Status: AC
  Administered 2021-06-28: 4 g via INTRAVENOUS
  Filled 2021-06-28: qty 100

## 2021-06-28 MED ORDER — LIDOCAINE HCL 1 % IJ SOLN
INTRAMUSCULAR | Status: AC
  Filled 2021-06-28: qty 20

## 2021-06-28 MED ORDER — SODIUM CHLORIDE 0.9% FLUSH
5.0000 mL | Freq: Three times a day (TID) | INTRAVENOUS | Status: DC
  Administered 2021-06-28 – 2021-06-29 (×2): 5 mL

## 2021-06-28 MED ORDER — SODIUM CHLORIDE 0.9 % IV BOLUS
500.0000 mL | Freq: Once | INTRAVENOUS | Status: AC
  Administered 2021-06-29: 500 mL via INTRAVENOUS

## 2021-06-28 MED ORDER — PANTOPRAZOLE SODIUM 40 MG IV SOLR
40.0000 mg | Freq: Two times a day (BID) | INTRAVENOUS | Status: DC
  Administered 2021-06-28 – 2021-07-01 (×6): 40 mg via INTRAVENOUS
  Filled 2021-06-28 (×6): qty 40

## 2021-06-28 MED ORDER — METRONIDAZOLE 500 MG/100ML IV SOLN
500.0000 mg | Freq: Three times a day (TID) | INTRAVENOUS | Status: DC
  Administered 2021-06-28 – 2021-07-01 (×9): 500 mg via INTRAVENOUS
  Filled 2021-06-28 (×10): qty 100

## 2021-06-28 MED ORDER — IOHEXOL 300 MG/ML  SOLN
50.0000 mL | Freq: Once | INTRAMUSCULAR | Status: AC | PRN
  Administered 2021-06-28: 25 mL

## 2021-06-28 MED ORDER — LIDOCAINE HCL 1 % IJ SOLN
INTRAMUSCULAR | Status: AC | PRN
Start: 2021-06-28 — End: 2021-06-28
  Administered 2021-06-28: 10 mL

## 2021-06-28 MED ORDER — EPOETIN ALFA 40000 UNIT/ML IJ SOLN
40000.0000 [IU] | INTRAMUSCULAR | Status: DC
  Administered 2021-06-28: 40000 [IU] via SUBCUTANEOUS
  Filled 2021-06-28: qty 1

## 2021-06-28 MED ORDER — MIDAZOLAM HCL 2 MG/2ML IJ SOLN
INTRAMUSCULAR | Status: AC | PRN
  Administered 2021-06-28 (×2): 0.5 mg via INTRAVENOUS
  Administered 2021-06-28: 1 mg via INTRAVENOUS
  Administered 2021-06-28 (×3): 0.5 mg via INTRAVENOUS
  Administered 2021-06-28: 1 mg via INTRAVENOUS

## 2021-06-28 MED ORDER — NOREPINEPHRINE 4 MG/250ML-% IV SOLN
0.0000 ug/min | INTRAVENOUS | Status: DC
Start: 2021-06-28 — End: 2021-06-29
  Administered 2021-06-28: 10 ug/min via INTRAVENOUS
  Administered 2021-06-29: 14 ug/min via INTRAVENOUS
  Filled 2021-06-28 (×4): qty 250

## 2021-06-28 MED ORDER — ALBUTEROL SULFATE HFA 108 (90 BASE) MCG/ACT IN AERS
2.0000 | INHALATION_SPRAY | RESPIRATORY_TRACT | Status: DC | PRN
  Administered 2021-06-28: 2 via RESPIRATORY_TRACT

## 2021-06-28 MED ORDER — LACTATED RINGERS IV BOLUS
500.0000 mL | Freq: Once | INTRAVENOUS | Status: AC
  Administered 2021-06-28: 500 mL via INTRAVENOUS

## 2021-06-28 MED ORDER — ALBUTEROL SULFATE (2.5 MG/3ML) 0.083% IN NEBU: 2.5000 mg | INHALATION_SOLUTION | RESPIRATORY_TRACT | Status: DC | PRN

## 2021-06-28 MED ORDER — FENTANYL CITRATE (PF) 100 MCG/2ML IJ SOLN
INTRAMUSCULAR | Status: AC | PRN
  Administered 2021-06-28 (×3): 25 ug via INTRAVENOUS
  Administered 2021-06-28: 50 ug via INTRAVENOUS
  Administered 2021-06-28: 25 ug via INTRAVENOUS

## 2021-06-28 MED ORDER — FENTANYL CITRATE (PF) 100 MCG/2ML IJ SOLN
INTRAMUSCULAR | Status: AC
  Filled 2021-06-28: qty 4

## 2021-06-28 NOTE — Progress Notes (Addendum)
0900- daughter, [name], updated via phone call on patient status and plan of care. Set up video call with daughters.   MD notified of patient complaints of abdominal pain. 10/10 "stomach" pain. With prompting, patient clarifies that the left side is more painful than the right. No nausea reported. MD to bedside, see new orders.  Sherry, called and updated on new pain.    1500- notified MD that patient has not voided today. Bladder scan of 137mls - see new orders.

## 2021-06-28 NOTE — Consult Note (Signed)
[facility] Consultation Note  Referring Provider: Internal Medicine Service/PCCM Primary Care Physician:  [name], MD  Reason for Consultation:  cholangitis  HPI: Monique Lin is a 87 y.o. female presenting lethargy, abdominal pain, sepsis syndrome.  Polymicrobial bacteremia.  Known CBD stone on prior imaging.  Has dilated bile duct (acutely worse).  No other source of infection besides GI.  Incidental notation of COVID.   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 ([place]) 03/10/2020   Hypertension    Hypothyroidism     Past Surgical History:  Procedure Laterality Date   BIOPSY  01/12/2020   Procedure: BIOPSY;  Surgeon: Ronald Lobo, MD;  Location: [place];  Service: Endoscopy;;   ESOPHAGOGASTRODUODENOSCOPY (EGD) WITH PROPOFOL N/A 01/12/2020   Procedure: ESOPHAGOGASTRODUODENOSCOPY (EGD) WITH PROPOFOL;  Surgeon: Ronald Lobo, MD;  Location: [facility] ENDOSCOPY;  Service: Endoscopy;  Laterality: N/A;   ESOPHAGOGASTRODUODENOSCOPY (EGD) WITH PROPOFOL N/A 01/15/2020   Procedure: ESOPHAGOGASTRODUODENOSCOPY (EGD) WITH PROPOFOL;  Surgeon: Arta Silence, MD;  Location: [place];  Service: Endoscopy;  Laterality: N/A;   EUS N/A 01/15/2020   Procedure: UPPER ENDOSCOPIC ULTRASOUND (EUS) RADIAL;  Surgeon: Arta Silence, MD;  Location: [place];  Service: Endoscopy;  Laterality: N/A;   FINE NEEDLE ASPIRATION  01/15/2020   Procedure: FINE NEEDLE ASPIRATION (FNA) LINEAR;  Surgeon: Arta Silence, MD;  Location: [place];  Service: Endoscopy;;   IR CATHETER TUBE CHANGE  04/14/2018   IR THORACENTESIS ASP PLEURAL SPACE W/IMG GUIDE  04/05/2018   NASAL SINUS SURGERY     stomach tumor removal      Prior to Admission medications   Medication Sig Start Date End Date Taking? Authorizing Provider  acetaminophen (TYLENOL) 650 MG CR tablet Take 650 mg by mouth every 8 (eight) hours as needed for  pain.   Yes [provider]  albuterol (PROVENTIL) (2.5 MG/3ML) 0.083% nebulizer solution Take 2.5 mg by nebulization in the morning, at noon, in the evening, and at bedtime.   Yes [provider]  albuterol (VENTOLIN HFA) 108 (90 Base) MCG/ACT inhaler Inhale 1 puff into the lungs every 6 (six) hours as needed for wheezing or shortness of breath.   Yes [provider]  ALLEGRA ALLERGY 180 MG tablet Take 180 mg by mouth in the morning.   Yes [provider]  alum & mag hydroxide-simeth (MAALOX/MYLANTA) 200-200-20 MG/5ML suspension Take 30 mLs by mouth every 6 (six) hours as needed for indigestion, heartburn or flatulence. 12/22/19  Yes [name], MD  artificial tears (LACRILUBE) OINT ophthalmic ointment Place into the left eye at bedtime. 02/01/21  Yes Elodia Florence., MD  atorvastatin (LIPITOR) 20 MG tablet Take 20 mg by mouth daily.   Yes [provider]  bisacodyl (DULCOLAX) 5 MG EC tablet Take 1 tablet (5 mg total) by mouth daily as needed for moderate constipation. 12/22/19  Yes [name], MD  carvedilol (COREG) 6.25 MG tablet Take 1 tablet (6.25 mg total) by mouth 2 (two) times daily with a meal. 01/30/20  Yes Ennever, Rudell Cobb, MD  Cholecalciferol (VITAMIN D-3) 125 MCG (5000 UT) TABS Take 5,000 Units by mouth daily.    Yes [provider]  Cyanocobalamin (VITAMIN B-12 PO) Take 500 mcg by mouth daily.    Yes [provider]  folic acid (FOLVITE) 616 MCG tablet Take 800 mcg by mouth daily.    Yes [provider]  gabapentin (NEURONTIN) 300  MG capsule Take 1 capsule (300 mg total) by mouth 2 (two) times daily. 01/16/20  Yes Mercy Riding, MD  hydroxypropyl methylcellulose / hypromellose (ISOPTO TEARS / GONIOVISC) 2.5 % ophthalmic solution Place 1 drop into the left eye 3 (three) times daily. Patient taking differently: Place 1 drop into the left eye 3 (three) times daily. Per daughter, she also uses it in the right eye  but not often. Just as needed. 02/01/21  Yes Elodia Florence., MD  imatinib (GLEEVEC) 400 MG tablet Take 1 tablet (400 mg total) by mouth daily. Take with meals and large glass of water.Caution:Chemotherapy 04/22/21  Yes Volanda Napoleon, MD  losartan (COZAAR) 25 MG tablet Take 25 mg by mouth daily.   Yes [provider]  ondansetron (ZOFRAN) 8 MG tablet Take 1 tablet (8 mg total) by mouth every 8 (eight) hours as needed for nausea or vomiting. 03/15/20  Yes Ennever, Rudell Cobb, MD  OVER THE COUNTER MEDICATION Apply 1 application topically See admin instructions. HempVana cream - as needed for knee and shoulder cream   Yes [provider]  pantoprazole (PROTONIX) 40 MG tablet TAKE 1 TABLET(40 MG) BY MOUTH TWICE DAILY Patient taking differently: Take 40 mg by mouth in the morning and at bedtime. 09/22/20  Yes Ennever, Rudell Cobb, MD  SYNTHROID 50 MCG tablet Take 50 mcg by mouth daily before breakfast.   Yes [provider]  trolamine salicylate (ASPERCREME) 10 % cream Apply 1 application topically as needed for muscle pain.   Yes [provider]  vitamin E 180 MG (400 UNITS) capsule Take 400 Units by mouth daily.   Yes [provider]  zinc gluconate 50 MG tablet Take 50 mg by mouth daily.   Yes [provider]    Current Facility-Administered Medications  Medication Dose Route Frequency Provider Last Rate Last Admin   0.9 %  sodium chloride infusion  250 mL Intravenous PRN Karmen Bongo, MD       0.9 %  sodium chloride infusion   Intravenous Continuous Karmen Bongo, MD 5 mL/hr at 06/28/21 1300 Infusion Verify at 06/28/21 1300   0.9 %  sodium chloride infusion  250 mL Intravenous Continuous Stretch, Marily Lente, MD   Held at 06/28/21 0100   acetaminophen (TYLENOL) tablet 650 mg  650 mg Oral Q6H PRN Karmen Bongo, MD       albuterol (VENTOLIN HFA) 108 (90 Base) MCG/ACT inhaler 2 puff  2 puff Inhalation Q2H PRN Karmen Bongo, MD   2 puff at  06/28/21 2094   artificial tears (LACRILUBE) ophthalmic ointment   Left Eye Ivery Quale, MD   Given at 06/28/21 7096   ascorbic acid (VITAMIN C) tablet 500 mg  500 mg Oral Daily Karmen Bongo, MD   500 mg at 06/28/21 2836   atorvastatin (LIPITOR) tablet 20 mg  20 mg Oral Daily Karmen Bongo, MD   20 mg at 06/28/21 6294   bisacodyl (DULCOLAX) EC tablet 5 mg  5 mg Oral Daily PRN Karmen Bongo, MD       cefTRIAXone (ROCEPHIN) 2 g in sodium chloride 0.9 % 100 mL IVPB  2 g Intravenous Q24H Audria Nine, DO 200 mL/hr at 06/28/21 1324 2 g at 06/28/21 1324   Chlorhexidine Gluconate Cloth 2 % PADS 6 each  6 each Topical Daily Audria Nine, DO   6 each at 06/28/21 0906   chlorpheniramine-HYDROcodone (TUSSIONEX) 10-8 MG/5ML suspension 5 mL  5 mL Oral Q12H PRN Karmen Bongo,  MD       docusate sodium (COLACE) capsule 100 mg  100 mg Oral BID Karmen Bongo, MD   100 mg at 06/28/21 0903   enoxaparin (LOVENOX) injection 30 mg  30 mg Subcutaneous Q24H Karmen Bongo, MD       epoetin alfa (EPOGEN) injection 40,000 Units  40,000 Units Subcutaneous Q Lurline Hare, MD   40,000 Units at 06/28/21 1016   guaiFENesin-dextromethorphan (ROBITUSSIN DM) 100-10 MG/5ML syrup 10 mL  10 mL Oral Q4H PRN Karmen Bongo, MD       hydrocortisone sodium succinate (SOLU-CORTEF) 100 MG injection 50 mg  50 mg Intravenous Q6H Corey Harold, NP   50 mg at 06/28/21 1322   lactated ringers bolus 500 mL  500 mL Intravenous Once Margaretha Seeds, MD       levothyroxine (SYNTHROID) tablet 50 mcg  50 mcg Oral QAC breakfast Karmen Bongo, MD   50 mcg at 06/28/21 0551   loratadine (CLARITIN) tablet 10 mg  10 mg Oral Daily Karmen Bongo, MD   10 mg at 06/28/21 1749   MEDLINE mouth rinse  15 mL Mouth Rinse BID Audria Nine, DO   15 mL at 06/28/21 4496   metroNIDAZOLE (FLAGYL) IVPB 500 mg  500 mg Intravenous Q8H Margaretha Seeds, MD       midodrine (PROAMATINE) tablet 10 mg  10 mg Oral TID WC Stretch,  Marily Lente, MD   10 mg at 06/28/21 7591   norepinephrine (LEVOPHED) 30m in 2524mpremix infusion  0-40 mcg/min Intravenous Titrated LiIona BeardMD 22.5 mL/hr at 06/28/21 1300 6 mcg/min at 06/28/21 1300   ondansetron (ZOFRAN) tablet 4 mg  4 mg Oral Q6H PRN YaKarmen BongoMD       Or   ondansetron (ZDepoo Hospitalinjection 4 mg  4 mg Intravenous Q6H PRN YaKarmen BongoMD       pantoprazole (PROTONIX) EC tablet 40 mg  40 mg Oral BID YaKarmen BongoMD   40 mg at 06/28/21 096384 polyethylene glycol (MIRALAX / GLYCOLAX) packet 17 g  17 g Oral Daily PRN YaKarmen BongoMD       polyvinyl alcohol (LIQUIFILM TEARS) 1.4 % ophthalmic solution 1 drop  1 drop Left Eye TID Pham, Minh Q, RPH-CPP   1 drop at 06/28/21 1434   remdesivir 100 mg in sodium chloride 0.9 % 100 mL IVPB  100 mg Intravenous Daily ElMargaretha SeedsMD   Paused at 06/28/21 1211   sodium chloride flush (NS) 0.9 % injection 3 mL  3 mL Intravenous Q1Lillia MountainMD   3 mL at 06/28/21 1142   sodium chloride flush (NS) 0.9 % injection 3 mL  3 mL Intravenous Q1Lillia MountainMD   3 mL at 06/28/21 1142   sodium chloride flush (NS) 0.9 % injection 3 mL  3 mL Intravenous PRN YaKarmen BongoMD       sodium phosphate (FLEET) 7-19 GM/118ML enema 1 enema  1 enema Rectal Once PRN YaKarmen BongoMD       zinc sulfate capsule 220 mg  220 mg Oral Daily YaKarmen BongoMD   220 mg at 06/28/21 [id number]  Allergies as of 06/18/2021 - Review Complete 06/14/2021  Allergen Reaction Noted   Codeine Other (See Comments) 10/14/2012   Penicillins Other (See Comments) 04/01/2018    Family History  Family history unknown: Yes    Social History   Socioeconomic History   Marital status: Widowed  Spouse name: Not on file   Number of children: Not on file   Years of education: Not on file   Highest education level: Not on file  Occupational History   Not on file  Tobacco Use   Smoking status: Never   Smokeless tobacco: Never  Vaping  Use   Vaping Use: Never used  Substance and Sexual Activity   Alcohol use: No   Drug use: No   Sexual activity: Not Currently  Other Topics Concern   Not on file  Social History Narrative   Not on file   Social Determinants of Health   Financial Resource Strain: Not on file  Food Insecurity: Not on file  Transportation Needs: Not on file  Physical Activity: Not on file  Stress: Not on file  Social Connections: Not on file  Intimate Partner Violence: Not on file    Review of Systems: As per HPI, all others negative  Physical Exam: Vital signs in last 24 hours: Temp:  [97.4 F (36.3 C)-98.1 F (36.7 C)] 97.7 F (36.5 C) (07/26 1434) Pulse Rate:  [68-97] 80 (07/26 1412) Resp:  [9-24] 16 (07/26 1412) BP: (69-145)/(36-77) 118/48 (07/26 1430) SpO2:  [91 %-100 %] 100 % (07/26 1412) Weight:  [85.9 kg] 85.9 kg (07/25 2029) Last BM Date: 06/28/21 General:   Elderly, right Bell's palsy Head:  Normocephalic and atraumatic. Eyes:  Sclera clear, no icterus.   Conjunctiva pale Ears:  Normal auditory acuity. Nose:  No deformity, discharge,  or lesions. Mouth:  No deformity or lesions.  Oropharynx pale and dry. Neck:  Supple; no masses or thyromegaly. Abdomen:  Moderate upper abdominal tenderness without peritonitis. No masses, hepatosplenomegaly or hernias noted. Normal bowel sounds, without guarding, and without rebound.     Msk:  Symmetrical without gross deformities. Normal posture. Pulses:  Normal pulses noted. Extremities:  Without clubbing or edema. Neurologic:  Somnolent but arousable, Bell's palsy, otherwise  grossly normal neurologically. Skin:  Intact without significant lesions or rashes. Psych:  Somnolent but arousable. Normal mood and affect.   Lab Results: Recent Labs    06/09/2021 0334 06/28/21 0410  WBC 3.7* 18.5*  HGB 9.8* 9.3*  HCT 29.0* 27.2*  PLT PLATELET CLUMPS NOTED ON SMEAR, UNABLE TO ESTIMATE 81*   BMET Recent Labs    06/10/2021 0334 06/28/21 0410   NA 135 141  K 3.9 4.1  CL 110 114*  CO2 20* 20*  GLUCOSE 99 90  BUN 23 25*  CREATININE 1.98* 2.19*  CALCIUM 8.2* 8.1*   LFT Recent Labs    06/28/21 0410  PROT 4.5*  ALBUMIN 2.1*  AST 221*  ALT 109*  ALKPHOS 354*  BILITOT 1.9*   PT/INR No results for input(s): LABPROT, INR in the last 72 hours.  Studies/Results: DG Chest 1 View  Result Date: 06/09/2021 CLINICAL DATA:  Shortness of breath EXAM: CHEST  1 VIEW COMPARISON:  05/03/2020 radiography and 06/08/2021 chest CT FINDINGS: Overall large lung volumes with streaky density behind the heart and at the medial right base. No edema, effusion, or pneumothorax. Likely normal heart size given technique. IMPRESSION: Atelectasis at the bases, also seen on chest CT 06/08/2021. No interval abnormality. Electronically Signed   By: Monte Fantasia M.D.   On: 06/19/2021 04:27   NM Pulmonary Perfusion  Result Date: 06/25/2021 CLINICAL DATA:  Chest pain, shortness of breath, emphysema, history of blood clots EXAM: NUCLEAR MEDICINE PERFUSION LUNG SCAN TECHNIQUE: Perfusion images were obtained in multiple projections after intravenous injection of radiopharmaceutical.  Ventilation scans intentionally deferred if perfusion scan and chest x-ray adequate for interpretation during COVID 19 epidemic. RADIOPHARMACEUTICALS:  4.4 mCi Tc-57mMAA IV COMPARISON:  Same day chest radiograph FINDINGS: Normal, homogeneous perfusion of the lungs bilaterally. No suspicious filling defects. Cardiomegaly. IMPRESSION: 1. Very low probability for pulmonary embolism by modified perfusion only PIOPED criteria (PE absent). 2.  Cardiomegaly. Electronically Signed   By: AEddie CandleM.D.   On: 06/09/2021 14:28   [place]oreaAbdomen Complete  Result Date: 06/28/2021 CLINICAL DATA:  Sepsis, eval common bile duct EXAM: ABDOMEN ULTRASOUND COMPLETE COMPARISON:  CT abdomen pelvis 06/08/2021 FINDINGS: Gallbladder: Nondilated gallbladder. There is intraluminal sludge and small stones, largest  stone measuring 6 mm. Minimal wall thickening. Minimal pericholecystic fluid. Common bile duct: Diameter: 13 mm Liver: Low parenchymal echogenicity. There is intrahepatic biliary ductal dilation. Portal vein is patent on color Doppler imaging with normal direction of blood flow towards the liver. IVC: Intrahepatic portion of visualized. Pancreas: Not visualized. Spleen: Not visualized. Right Kidney: Length: 10.3 cm.  No hydronephrosis. Left Kidney: Length: Not well visualized. Abdominal aorta: Not visualized due to bowel gas. Other findings: None. IMPRESSION: Intra- and extrahepatic biliary ductal dilation with common bile duct measuring up to 13 mm. Correlate with LFTs and recommend MRCP for further evaluation. Intraluminal sludge and small stones within a nondilated gallbladder. Low hepatic parenchyma echogenicity which can be seen in hepatitis. These results will be called to the ordering clinician or representative by the Radiologist Assistant, and communication documented in the PACS or [employer] Electronically Signed   By: JMaurine Simmering  On: 06/28/2021 12:45   DG CHEST PORT 1 VIEW  Result Date: 06/28/2021 CLINICAL DATA:  Abnormal respiration EXAM: PORTABLE CHEST 1 VIEW COMPARISON:  06/29/2021, CT 06/08/2021 FINDINGS: Persistent streaky and hazy opacities in the lung bases more coalescent density in the retrocardiac space. No pneumothorax or discernible pleural effusion. No focal consolidative process. Pulmonary vascularity remains normally distributed. Stable cardiomediastinal contours with a calcified aorta. The osseous structures appear diffusely demineralized which may limit detection of small or nondisplaced fractures. No acute osseous abnormality or suspicious osseous lesion. Degenerative changes are present in the imaged spine and shoulders. No worrisome soft tissue abnormality of the chest wall. Telemetry leads overlie the chest. IMPRESSION: Streaky and hazy opacities in the bases favoring  atelectasis. More coalescent density in the left lung base, likely further volume loss or early airspace disease. Aortic Atherosclerosis (ICD10-I70.0). Electronically Signed   By: PLovena LeM.D.   On: 06/28/2021 03:09    Impression:  Cholangitis with sepsis syndrome and polymicrobial bacteremia. COVID +. History GIST, on Gleevec.  Plan:   Ongoing medical management including volume repletion/pressors/antibiotics. I have asked primary team to obtain IR consultation for consideration of PTC for patient's acute cholangitis with sepsis. ERCP down the road can be considered, but she is far too sick for ercp right now.   [facility] will follow.   LOS: 1 day   Rhealyn Cullen M  06/28/2021, 3:08 PM  Cell [phone] no answer or after 5 PM call [phone]

## 2021-06-28 NOTE — Plan of Care (Signed)
Called about patient.    I will see her shortly and do formal consult, but based on my conversation with ICU team and the data available, patient appears to have CBD stone (prior imaging) and now biliary ductal dilatation and fevers and hypotension and polymicrobial bacteremia.  Incidental COVID positive.  Patient is far too sick to tolerate ERCP.  Suggest:  Ongoing medical management including volume repletion/antibiotics. IR consultation for consideration of PTC for patient's acute cholangitis with sepsis. ERCP down the road can be considered, but she is far too sick for ercp right now.   Formal evaluation and consultation upcoming.

## 2021-06-28 NOTE — Progress Notes (Addendum)
Referring Physician(s): Hana  Supervising Physician: [facility]  Patient Status:  [facility] - In-pt  Chief Complaint:  Abdominal pain, lethargy, sepsis  Subjective: Patient familiar to IR service from left thoracentesis in 2019, and left pleural empyema drainage in 2019.  She has a past medical history significant for GIST, hypertension, stage IV chronic kidney disease, hypothyroidism and anemia.  She also has a past history of gallstones as well as distal common bile duct stone. She was admitted to [facility] on 7/25 with weakness, hypotension, headache, chest/abdominal pain, dyspnea and noted to be COVID-19 positive.  Blood cultures grew Klebsiella pneumonia, Enterobacter and E. coli.  Nuclear medicine perfusion scan revealed low probability for PE.  Abdominal ultrasound revealed:  Intra- and extrahepatic biliary ductal dilation with common bile duct measuring up to 13 mm. Correlate with LFTs and recommend MRCP for further evaluation.   Intraluminal sludge and small stones within a nondilated gallbladder.   Low hepatic parenchyma echogenicity which can be seen in hepatitis  Current labs include WBC 18.5, hemoglobin 9.3, platelets 81K, creatinine 2.19, total bilirubin 1.9, AST 221, ALT 109, alk phos 354 D-dimer 9.1 PT/INR pending.  Patient currently afebrile, BP 118/48, on Lovenox, IV Cortef, rocephin,, Levophed ,remdesivir and Flagyl.  Patient seen by GI team and felt to be poor candidate for ERCP at this time.  Request now received for PTC with biliary drain placement due to concern for cholangitis with sepsis.   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 ([place]) 03/10/2020   Hypertension    Hypothyroidism    Past Surgical History:  Procedure Laterality Date   BIOPSY  01/12/2020   Procedure: BIOPSY;  Surgeon: Ronald Lobo, MD;  Location: [place];  Service: Endoscopy;;    ESOPHAGOGASTRODUODENOSCOPY (EGD) WITH PROPOFOL N/A 01/12/2020   Procedure: ESOPHAGOGASTRODUODENOSCOPY (EGD) WITH PROPOFOL;  Surgeon: Ronald Lobo, MD;  Location: [facility] ENDOSCOPY;  Service: Endoscopy;  Laterality: N/A;   ESOPHAGOGASTRODUODENOSCOPY (EGD) WITH PROPOFOL N/A 01/15/2020   Procedure: ESOPHAGOGASTRODUODENOSCOPY (EGD) WITH PROPOFOL;  Surgeon: Arta Silence, MD;  Location: [place];  Service: Endoscopy;  Laterality: N/A;   EUS N/A 01/15/2020   Procedure: UPPER ENDOSCOPIC ULTRASOUND (EUS) RADIAL;  Surgeon: Arta Silence, MD;  Location: [place];  Service: Endoscopy;  Laterality: N/A;   FINE NEEDLE ASPIRATION  01/15/2020   Procedure: FINE NEEDLE ASPIRATION (FNA) LINEAR;  Surgeon: Arta Silence, MD;  Location: [place];  Service: Endoscopy;;   IR CATHETER TUBE CHANGE  04/14/2018   IR THORACENTESIS ASP PLEURAL SPACE W/IMG GUIDE  04/05/2018   NASAL SINUS SURGERY     stomach tumor removal       Allergies: Codeine and Penicillins  Medications: Prior to Admission medications   Medication Sig Start Date End Date Taking? Authorizing Provider  acetaminophen (TYLENOL) 650 MG CR tablet Take 650 mg by mouth every 8 (eight) hours as needed for pain.   Yes [provider]  albuterol (PROVENTIL) (2.5 MG/3ML) 0.083% nebulizer solution Take 2.5 mg by nebulization in the morning, at noon, in the evening, and at bedtime.   Yes [provider]  albuterol (VENTOLIN HFA) 108 (90 Base) MCG/ACT inhaler Inhale 1 puff into the lungs every 6 (six) hours as needed for wheezing or shortness of breath.   Yes [provider]  ALLEGRA ALLERGY 180 MG tablet Take 180 mg by mouth in the morning.   Yes [provider]  alum & mag hydroxide-simeth (  MAALOX/MYLANTA) 200-200-20 MG/5ML suspension Take 30 mLs by mouth every 6 (six) hours as needed for indigestion, heartburn or flatulence. 12/22/19  Yes [name], MD  artificial tears (LACRILUBE) OINT ophthalmic ointment Place  into the left eye at bedtime. 02/01/21  Yes Elodia Florence., MD  atorvastatin (LIPITOR) 20 MG tablet Take 20 mg by mouth daily.   Yes [provider]  bisacodyl (DULCOLAX) 5 MG EC tablet Take 1 tablet (5 mg total) by mouth daily as needed for moderate constipation. 12/22/19  Yes [name], MD  carvedilol (COREG) 6.25 MG tablet Take 1 tablet (6.25 mg total) by mouth 2 (two) times daily with a meal. 01/30/20  Yes Ennever, Rudell Cobb, MD  Cholecalciferol (VITAMIN D-3) 125 MCG (5000 UT) TABS Take 5,000 Units by mouth daily.    Yes [provider]  Cyanocobalamin (VITAMIN B-12 PO) Take 500 mcg by mouth daily.    Yes [provider]  folic acid (FOLVITE) 620 MCG tablet Take 800 mcg by mouth daily.    Yes [provider]  gabapentin (NEURONTIN) 300 MG capsule Take 1 capsule (300 mg total) by mouth 2 (two) times daily. 01/16/20  Yes Mercy Riding, MD  hydroxypropyl methylcellulose / hypromellose (ISOPTO TEARS / GONIOVISC) 2.5 % ophthalmic solution Place 1 drop into the left eye 3 (three) times daily. Patient taking differently: Place 1 drop into the left eye 3 (three) times daily. Per daughter, she also uses it in the right eye but not often. Just as needed. 02/01/21  Yes Elodia Florence., MD  imatinib (GLEEVEC) 400 MG tablet Take 1 tablet (400 mg total) by mouth daily. Take with meals and large glass of water.Caution:Chemotherapy 04/22/21  Yes Volanda Napoleon, MD  losartan (COZAAR) 25 MG tablet Take 25 mg by mouth daily.   Yes [provider]  ondansetron (ZOFRAN) 8 MG tablet Take 1 tablet (8 mg total) by mouth every 8 (eight) hours as needed for nausea or vomiting. 03/15/20  Yes Ennever, Rudell Cobb, MD  OVER THE COUNTER MEDICATION Apply 1 application topically See admin instructions. HempVana cream - as needed for knee and shoulder cream   Yes [provider]  pantoprazole (PROTONIX) 40 MG tablet TAKE 1 TABLET(40 MG) BY MOUTH TWICE DAILY Patient  taking differently: Take 40 mg by mouth in the morning and at bedtime. 09/22/20  Yes Ennever, Rudell Cobb, MD  SYNTHROID 50 MCG tablet Take 50 mcg by mouth daily before breakfast.   Yes [provider]  trolamine salicylate (ASPERCREME) 10 % cream Apply 1 application topically as needed for muscle pain.   Yes [provider]  vitamin E 180 MG (400 UNITS) capsule Take 400 Units by mouth daily.   Yes [provider]  zinc gluconate 50 MG tablet Take 50 mg by mouth daily.   Yes [provider]     Vital Signs: BP (!) 118/48   Pulse 80   Temp 97.7 F (36.5 C) (Oral)   Resp 16   Wt 189 lb 6 oz (85.9 kg)   SpO2 100%   BMI 33.55 kg/m   Physical Exam: recent CCM exam: General: Obese elderly female, appears uncomfortable  HENT: /AT, PERRL, no JVD Lungs: Clear bilateral breath sounds, wheezing or crackles, no accessory muscle use Cardiovascular: RRR, no MRG Abdomen: Soft, diffusely TTP greatest in the LUQ, no masses, active bowel sounds Extremities: N0 acute deformity. 1+ edema of LE bilaterally Neuro: Aox4, no focal weakness  Imaging: DG Chest  1 View  Result Date: 06/03/2021 CLINICAL DATA:  Shortness of breath EXAM: CHEST  1 VIEW COMPARISON:  05/03/2020 radiography and 06/08/2021 chest CT FINDINGS: Overall large lung volumes with streaky density behind the heart and at the medial right base. No edema, effusion, or pneumothorax. Likely normal heart size given technique. IMPRESSION: Atelectasis at the bases, also seen on chest CT 06/08/2021. No interval abnormality. Electronically Signed   By: Monte Fantasia M.D.   On: 06/12/2021 04:27   NM Pulmonary Perfusion  Result Date: 06/30/2021 CLINICAL DATA:  Chest pain, shortness of breath, emphysema, history of blood clots EXAM: NUCLEAR MEDICINE PERFUSION LUNG SCAN TECHNIQUE: Perfusion images were obtained in multiple projections after intravenous injection of radiopharmaceutical. Ventilation scans intentionally  deferred if perfusion scan and chest x-ray adequate for interpretation during COVID 19 epidemic. RADIOPHARMACEUTICALS:  4.4 mCi Tc-58mMAA IV COMPARISON:  Same day chest radiograph FINDINGS: Normal, homogeneous perfusion of the lungs bilaterally. No suspicious filling defects. Cardiomegaly. IMPRESSION: 1. Very low probability for pulmonary embolism by modified perfusion only PIOPED criteria (PE absent). 2.  Cardiomegaly. Electronically Signed   By: AEddie CandleM.D.   On: 06/08/2021 14:28   UK[place]Abdomen Complete  Result Date: 06/28/2021 CLINICAL DATA:  Sepsis, eval common bile duct EXAM: ABDOMEN ULTRASOUND COMPLETE COMPARISON:  CT abdomen pelvis 06/08/2021 FINDINGS: Gallbladder: Nondilated gallbladder. There is intraluminal sludge and small stones, largest stone measuring 6 mm. Minimal wall thickening. Minimal pericholecystic fluid. Common bile duct: Diameter: 13 mm Liver: Low parenchymal echogenicity. There is intrahepatic biliary ductal dilation. Portal vein is patent on color Doppler imaging with normal direction of blood flow towards the liver. IVC: Intrahepatic portion of visualized. Pancreas: Not visualized. Spleen: Not visualized. Right Kidney: Length: 10.3 cm.  No hydronephrosis. Left Kidney: Length: Not well visualized. Abdominal aorta: Not visualized due to bowel gas. Other findings: None. IMPRESSION: Intra- and extrahepatic biliary ductal dilation with common bile duct measuring up to 13 mm. Correlate with LFTs and recommend MRCP for further evaluation. Intraluminal sludge and small stones within a nondilated gallbladder. Low hepatic parenchyma echogenicity which can be seen in hepatitis. These results will be called to the ordering clinician or representative by the Radiologist Assistant, and communication documented in the PACS or [employer] Electronically Signed   By: JMaurine Simmering  On: 06/28/2021 12:45   DG CHEST PORT 1 VIEW  Result Date: 06/28/2021 CLINICAL DATA:  Abnormal respiration  EXAM: PORTABLE CHEST 1 VIEW COMPARISON:  06/07/2021, CT 06/08/2021 FINDINGS: Persistent streaky and hazy opacities in the lung bases more coalescent density in the retrocardiac space. No pneumothorax or discernible pleural effusion. No focal consolidative process. Pulmonary vascularity remains normally distributed. Stable cardiomediastinal contours with a calcified aorta. The osseous structures appear diffusely demineralized which may limit detection of small or nondisplaced fractures. No acute osseous abnormality or suspicious osseous lesion. Degenerative changes are present in the imaged spine and shoulders. No worrisome soft tissue abnormality of the chest wall. Telemetry leads overlie the chest. IMPRESSION: Streaky and hazy opacities in the bases favoring atelectasis. More coalescent density in the left lung base, likely further volume loss or early airspace disease. Aortic Atherosclerosis (ICD10-I70.0). Electronically Signed   By: PLovena LeM.D.   On: 06/28/2021 03:09   ECHOCARDIOGRAM LIMITED  Result Date: 06/28/2021    ECHOCARDIOGRAM LIMITED REPORT   Patient Name:   DCAMBRYN CHARTERSDate of Exam: 06/28/2021 Medical Rec #:  [id number]   Height:       63.0 in Accession #:  [id number]   Weight:       189.4 lb Date of Birth:  Feb 27, 1927     BSA:          1.889 m Patient Age:    65 years     BP:           104/44 mmHg Patient Gender: F            HR:           80 bpm. Exam Location:  Inpatient Procedure: Limited Echo, Cardiac Doppler and Color Doppler Indications:    Other abnormalities of the heart R00.8  History:        Patient has prior history of Echocardiogram examinations, most                 recent 01/13/2020. Risk Factors:Hypertension and Non-Smoker.  Sonographer:    Vickie Epley RDCS Referring Phys: [id number] [place] MARSHALL  Sonographer Comments: Covid positive. IMPRESSIONS  1. The left ventricle has normal function. The left ventricle has no regional wall motion abnormalities. There is mild concentric  left ventricular hypertrophy. Left ventricular diastolic parameters are consistent with Grade II diastolic dysfunction (pseudonormalization).  2. The right ventricular size is normal. There is mildly elevated pulmonary artery systolic pressure.  3. The mitral valve is normal in structure. Trivial mitral valve regurgitation.  4. The aortic valve is tricuspid. Aortic valve regurgitation is not visualized. Mild aortic valve sclerosis is present, with no evidence of aortic valve stenosis.  5. The inferior vena cava is normal in size with greater than 50% respiratory variability, suggesting right atrial pressure of 3 mmHg. FINDINGS  Left Ventricle: The left ventricle has normal function. The left ventricle has no regional wall motion abnormalities. The left ventricular internal cavity size was normal in size. There is mild concentric left ventricular hypertrophy. Left ventricular diastolic parameters are consistent with Grade II diastolic dysfunction (pseudonormalization). Right Ventricle: The right ventricular size is normal. No increase in right ventricular wall thickness. There is mildly elevated pulmonary artery systolic pressure. The tricuspid regurgitant velocity is 3.24 m/s, and with an assumed right atrial pressure  of 3 mmHg, the estimated right ventricular systolic pressure is 79.8 mmHg. Left Atrium: Left atrial size was normal in size. Right Atrium: Right atrial size was normal in size. Pericardium: There is no evidence of pericardial effusion. Mitral Valve: The mitral valve is normal in structure. Trivial mitral valve regurgitation. Tricuspid Valve: The tricuspid valve is normal in structure. Tricuspid valve regurgitation is mild. Aortic Valve: The aortic valve is tricuspid. Aortic valve regurgitation is not visualized. Mild aortic valve sclerosis is present, with no evidence of aortic valve stenosis. Pulmonic Valve: The pulmonic valve was normal in structure. Pulmonic valve regurgitation is not visualized.  Aorta: The aortic root is normal in size and structure. Venous: The inferior vena cava is normal in size with greater than 50% respiratory variability, suggesting right atrial pressure of 3 mmHg. LEFT VENTRICLE PLAX 2D LVIDd:         4.40 cm     Diastology LVIDs:         2.80 cm     LV e' medial:    7.18 cm/s LV PW:         0.70 cm     LV E/e' medial:  16.0 LV IVS:        0.70 cm     LV e' lateral:   8.27 cm/s LVOT diam:     1.70 cm  LV E/e' lateral: 13.9 LV SV:         53 LV SV Index:   28 LVOT Area:     2.27 cm  LV Volumes (MOD) LV vol d, MOD A2C: 77.8 ml LV vol d, MOD A4C: 70.0 ml LV vol s, MOD A2C: 32.5 ml LV vol s, MOD A4C: 34.5 ml LV SV MOD A2C:     45.3 ml LV SV MOD A4C:     70.0 ml LV SV MOD BP:      41.2 ml RIGHT VENTRICLE RV S prime:     12.70 cm/s TAPSE (M-mode): 2.2 cm LEFT ATRIUM         Index LA diam:    3.60 cm 1.91 cm/m  AORTIC VALVE LVOT Vmax:   106.00 cm/s LVOT Vmean:  75.400 cm/s LVOT VTI:    0.232 m  AORTA Ao Root diam: 3.10 cm MITRAL VALVE                TRICUSPID VALVE MV Area (PHT): 3.79 cm     TR Peak grad:   42.0 mmHg MV Decel Time: 200 msec     TR Vmax:        324.00 cm/s MV E velocity: 115.00 cm/s MV A velocity: 88.10 cm/s   SHUNTS MV E/A ratio:  1.31         Systemic VTI:  0.23 m                             Systemic Diam: 1.70 cm [name] MD Electronically signed by [name] MD Signature Date/Time: 06/28/2021/3:26:32 PM    Final     Labs:  CBC: Recent Labs    04/22/21 1439 06/08/21 1358 06/24/2021 0334 06/28/21 0410  WBC 5.6 4.5 3.7* 18.5*  HGB 9.9* 8.1* 9.8* 9.3*  HCT 29.8* 23.7* 29.0* 27.2*  PLT 167 150 PLATELET CLUMPS NOTED ON SMEAR, UNABLE TO ESTIMATE 81*    COAGS: No results for input(s): INR, APTT in the last 8760 hours.  BMP: Recent Labs    07/14/20 1518 08/13/20 1513 09/29/20 1516 04/22/21 1439 06/08/21 1358 06/26/2021 0334 06/28/21 0410  NA 143 137   < > 141 140 135 141  K 4.8 4.8   < > 4.4 4.2 3.9 4.1  CL 110 102   < > 109 110 110 114*   CO2 27 30   < > 22 20* 20* 20*  GLUCOSE 81 83   < > 97 87 99 90  BUN 28* 18   < > _0 25*  CALCIUM 9.3 9.5   < > 9.7 9.0 8.2* 8.1*  CREATININE 1.30* 1.30*   < > 1.49* 1.82* 1.98* 2.19*  GFRNONAA 36* 36*   < > 33* 26* 23* 21*  GFRAA 42* 42*  --   --   --   --   --    < > = values in this interval not displayed.    LIVER FUNCTION TESTS: Recent Labs    02/23/21 1151 04/22/21 1439 06/08/21 1358 06/28/21 0410  BILITOT 0.7 0.8 0.5 1.9*  AST 14* 17 22 221*  ALT _1 109*  ALKPHOS 84 62 57 354*  PROT 7.0 6.0* 5.6* 4.5*  ALBUMIN 3.8 3.5 3.3* 2.1*    Assessment and Plan: Patient familiar to IR service from left thoracentesis in 2019, and left pleural empyema drainage in 2019.  She has a past medical  history significant for GIST, hypertension, stage IV chronic kidney disease, hypothyroidism and anemia.  She also has a past history of gallstones as well as distal common bile duct stone. She was admitted to [facility] on 7/25 with weakness, hypotension, headache, chest/abdominal pain, dyspnea and noted to be COVID-19 positive.  Blood cultures grew Klebsiella pneumonia, Enterobacter and E. coli.  Nuclear medicine perfusion scan revealed low probability for PE.  Abdominal ultrasound revealed:  Intra- and extrahepatic biliary ductal dilation with common bile duct measuring up to 13 mm. Correlate with LFTs and recommend MRCP for further evaluation.   Intraluminal sludge and small stones within a nondilated gallbladder.   Low hepatic parenchyma echogenicity which can be seen in hepatitis  Current labs include WBC 18.5, hemoglobin 9.3, platelets 81K, creatinine 2.19, total bilirubin 1.9, AST 221, ALT 109, alk phos 354 D-dimer 9.1 PT/INR pending.  Patient currently afebrile, BP 118/48, on Lovenox, IV Cortef, rocephin,, Levophed ,remdesivir and Flagyl.  Patient seen by GI team and felt to be poor candidate for ERCP at this time.  Request now received for PTC with biliary drain  placement due to concern for cholangitis with sepsis.  Prior imaging studies were reviewed by Dr. Anselm Pancoast.  Case discussed with Dr. Loanne Drilling.  We will plan for updated CT abdomen pelvis without contrast this afternoon for further assessment of biliary system prior to possible PTC.  Details/risks of above procedure, including but not limited to, internal bleeding, infection, injury to adjacent structures, prolonged drainage discussed with patient's daughters with their understanding and consent.   Electronically Signed: D. Rowe Robert, PA-C 06/28/2021, 3:37 PM       Patient ID: Monique Lin, female   DOB: 26-Dec-1926, 87 y.o.   MRN: [id number]

## 2021-06-28 NOTE — Progress Notes (Signed)
[place] Progress Note Patient Name: Monique Lin DOB: 05/07/1927 MRN: [id number]   Date of Service  06/28/2021  HPI/Events of Note  Magnesium level 1.4.  eICU Interventions  Ordered 4g magnesium sulfate IVPB     Intervention Category Minor Interventions: Electrolytes abnormality - evaluation and management  Marily Lente Adrieanna Boteler 06/28/2021, 5:56 AM

## 2021-06-28 NOTE — Consult Note (Signed)
Referral MD  Reason for Referral: COVID infection; gram-negative rod bacteremia; GIST  Chief Complaint  Patient presents with   Covid Positive  : Patient cannot give any history.  HPI: Monique Lin is well-known to me.  She is a very nice 87 year old African-American female.  I have been following her for a couple years.  She has a gastrointestinal stromal tumor.  She presented with GI bleeding.  She has done nicely on Gleevec.  Her disease has been relatively stable.  She last had scans on 06/08/2021.  She is on Gleevec 400 mg a day.  This was not affected her immune system.  She was admitted with weakness and pain.  She did test positive for COVID a couple days ago.  She was having some respiratory issues.  She subsequently has been found to have bacteremia.  She has gram-negative rod bacteremia.  She has the E. coli and Klebsiella in her blood..  She is hypotensive.  She is on some pressors for this.  She has chronic renal insufficiency.  Her BUN is 25 creatinine 2.19.  Her LFTs are somewhat elevated.  Her white cell count is 18.5.  Hemoglobin 9.3.  Platelet count 81,000.  She has an elevated C-reactive protein of 11.5.  She is on IV antibiotics.  Again she does have some anemia.  She has renal insufficiency.  We will go ahead and give her a dose of Procrit to see this may not be able to help with her anemia.  She really is not able to give any kind of history.  She is somewhat lethargic.  I am unsure how she contracted the [place].  Currently, I would say performance status is by ECOG 3.  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 ([place]) 03/10/2020   Hypertension    Hypothyroidism   :   Past Surgical History:  Procedure Laterality Date   BIOPSY  01/12/2020   Procedure: BIOPSY;  Surgeon: Ronald Lobo, MD;  Location: [place];  Service: Endoscopy;;   ESOPHAGOGASTRODUODENOSCOPY (EGD) WITH  PROPOFOL N/A 01/12/2020   Procedure: ESOPHAGOGASTRODUODENOSCOPY (EGD) WITH PROPOFOL;  Surgeon: Ronald Lobo, MD;  Location: [facility] ENDOSCOPY;  Service: Endoscopy;  Laterality: N/A;   ESOPHAGOGASTRODUODENOSCOPY (EGD) WITH PROPOFOL N/A 01/15/2020   Procedure: ESOPHAGOGASTRODUODENOSCOPY (EGD) WITH PROPOFOL;  Surgeon: Arta Silence, MD;  Location: [place];  Service: Endoscopy;  Laterality: N/A;   EUS N/A 01/15/2020   Procedure: UPPER ENDOSCOPIC ULTRASOUND (EUS) RADIAL;  Surgeon: Arta Silence, MD;  Location: [place];  Service: Endoscopy;  Laterality: N/A;   FINE NEEDLE ASPIRATION  01/15/2020   Procedure: FINE NEEDLE ASPIRATION (FNA) LINEAR;  Surgeon: Arta Silence, MD;  Location: [place];  Service: Endoscopy;;   IR CATHETER TUBE CHANGE  04/14/2018   IR THORACENTESIS ASP PLEURAL SPACE W/IMG GUIDE  04/05/2018   NASAL SINUS SURGERY     stomach tumor removal    :   Current Facility-Administered Medications:    0.9 %  sodium chloride infusion, 250 mL, Intravenous, PRN, Karmen Bongo, MD   0.9 %  sodium chloride infusion, , Intravenous, Continuous, Karmen Bongo, MD, Stopped at 06/28/21 0122   0.9 %  sodium chloride infusion, 250 mL, Intravenous, Continuous, Stretch, Marily Lente, MD, Held at 06/28/21 0100   acetaminophen (TYLENOL) tablet 650 mg, 650 mg, Oral, Q6H PRN, Karmen Bongo, MD   albuterol (VENTOLIN HFA) 108 (90 Base) MCG/ACT inhaler 2 puff, 2 puff, Inhalation, Q2H PRN,  Karmen Bongo, MD, 2 puff at 06/28/21 0134   artificial tears (LACRILUBE) ophthalmic ointment, , Left Eye, Ivery Quale, MD, Given at 06/18/2021 2211   ascorbic acid (VITAMIN C) tablet 500 mg, 500 mg, Oral, Daily, Karmen Bongo, MD, 500 mg at 06/21/2021 1408   atorvastatin (LIPITOR) tablet 20 mg, 20 mg, Oral, Daily, Karmen Bongo, MD   azithromycin [facility]) 500 mg in sodium chloride 0.9 % 250 mL IVPB, 500 mg, Intravenous, Q24H, Karmen Bongo, MD, Stopped at 06/03/2021 1552   bisacodyl (DULCOLAX) EC  tablet 5 mg, 5 mg, Oral, Daily PRN, Karmen Bongo, MD   cefTRIAXone (ROCEPHIN) 2 g in sodium chloride 0.9 % 100 mL IVPB, 2 g, Intravenous, Q24H, Marshall, Jessica, DO   Chlorhexidine Gluconate Cloth 2 % PADS 6 each, 6 each, Topical, Daily, Audria Nine, DO, 6 each at 06/15/2021 2034   chlorpheniramine-HYDROcodone ([place]) 10-8 MG/5ML suspension 5 mL, 5 mL, Oral, Q12H PRN, Karmen Bongo, MD   docusate sodium (COLACE) capsule 100 mg, 100 mg, Oral, BID, Karmen Bongo, MD, 100 mg at 06/14/2021 1409   enoxaparin (LOVENOX) injection 30 mg, 30 mg, Subcutaneous, Q24H, Karmen Bongo, MD   guaiFENesin-dextromethorphan (ROBITUSSIN DM) 100-10 MG/5ML syrup 10 mL, 10 mL, Oral, Q4H PRN, Karmen Bongo, MD   hydrocortisone sodium succinate (SOLU-CORTEF) 100 MG injection 50 mg, 50 mg, Intravenous, Q6H, Corey Harold, NP, 50 mg at 06/28/21 9983   levothyroxine (SYNTHROID) tablet 50 mcg, 50 mcg, Oral, QAC breakfast, Karmen Bongo, MD, 50 mcg at 06/28/21 0551   loratadine (CLARITIN) tablet 10 mg, 10 mg, Oral, Daily, Karmen Bongo, MD   magnesium sulfate IVPB 4 g 100 mL, 4 g, Intravenous, Once, Stretch, Marily Lente, MD, Last Rate: 50 mL/hr at 06/28/21 0644, 4 g at 06/28/21 0644   MEDLINE mouth rinse, 15 mL, Mouth Rinse, BID, Ruthann Cancer, Jessica, DO, 15 mL at 06/17/2021 2200   midodrine (PROAMATINE) tablet 10 mg, 10 mg, Oral, TID WC, Stretch, Marily Lente, MD, 10 mg at 06/23/2021 2358   ondansetron (ZOFRAN) tablet 4 mg, 4 mg, Oral, Q6H PRN **OR** ondansetron (ZOFRAN) injection 4 mg, 4 mg, Intravenous, Q6H PRN, Karmen Bongo, MD   pantoprazole (PROTONIX) EC tablet 40 mg, 40 mg, Oral, BID, Karmen Bongo, MD, 40 mg at 06/22/2021 2213   phenylephrine (NEOSYNEPHRINE) 10-0.9 MG/250ML-% infusion, 25-200 mcg/min, Intravenous, Titrated, Stretch, Marily Lente, MD, Last Rate: 67.5 mL/hr at 06/28/21 0552, 45 mcg/min at 06/28/21 0552   polyethylene glycol (MIRALAX / GLYCOLAX) packet 17 g, 17 g, Oral, Daily PRN, Karmen Bongo,  MD   polyvinyl alcohol (LIQUIFILM TEARS) 1.4 % ophthalmic solution 1 drop, 1 drop, Left Eye, TID, Pham, Minh Q, RPH-CPP, 1 drop at 06/28/2021 2213   sodium chloride flush (NS) 0.9 % injection 3 mL, 3 mL, Intravenous, Q12H, Karmen Bongo, MD, 3 mL at 06/15/2021 1412   sodium chloride flush (NS) 0.9 % injection 3 mL, 3 mL, Intravenous, Q12H, Karmen Bongo, MD, 3 mL at 06/14/2021 2109   sodium chloride flush (NS) 0.9 % injection 3 mL, 3 mL, Intravenous, PRN, Karmen Bongo, MD   sodium phosphate (FLEET) 7-19 GM/118ML enema 1 enema, 1 enema, Rectal, Once PRN, Karmen Bongo, MD   zinc sulfate capsule 220 mg, 220 mg, Oral, Daily, Karmen Bongo, MD, 220 mg at 06/05/2021 1410:   artificial tears   Left Eye QHS   vitamin C  500 mg Oral Daily   atorvastatin  20 mg Oral Daily   Chlorhexidine Gluconate Cloth  6 each Topical Daily   docusate sodium  100 mg Oral BID   enoxaparin (LOVENOX) injection  30 mg Subcutaneous Q24H   hydrocortisone sodium succinate  50 mg Intravenous Q6H   levothyroxine  50 mcg Oral QAC breakfast   loratadine  10 mg Oral Daily   mouth rinse  15 mL Mouth Rinse BID   midodrine  10 mg Oral TID WC   pantoprazole  40 mg Oral BID   polyvinyl alcohol  1 drop Left Eye TID   sodium chloride flush  3 mL Intravenous Q12H   sodium chloride flush  3 mL Intravenous Q12H   zinc sulfate  220 mg Oral Daily  :   Allergies  Allergen Reactions   Codeine Other (See Comments)    Stomach cramps   Penicillins Other (See Comments)    "Bad stomach cramps" Has patient had a PCN reaction causing immediate rash, facial/tongue/throat swelling, SOB or lightheadedness with hypotension: Unk Has patient had a PCN reaction causing severe rash involving mucus membranes or skin necrosis: Unk Has patient had a PCN reaction that required hospitalization: Unk Has patient had a PCN reaction occurring within the last 10 years: No If all of the above answers are "NO", then may proceed with Cephalosporin  use.    :   Family History  Family history unknown: Yes  :   Social History   Socioeconomic History   Marital status: Widowed    Spouse name: Not on file   Number of children: Not on file   Years of education: Not on file   Highest education level: Not on file  Occupational History   Not on file  Tobacco Use   Smoking status: Never   Smokeless tobacco: Never  Vaping Use   Vaping Use: Never used  Substance and Sexual Activity   Alcohol use: No   Drug use: No   Sexual activity: Not Currently  Other Topics Concern   Not on file  Social History Narrative   Not on file   Social Determinants of Health   Financial Resource Strain: Not on file  Food Insecurity: Not on file  Transportation Needs: Not on file  Physical Activity: Not on file  Stress: Not on file  Social Connections: Not on file  Intimate Partner Violence: Not on file  :  Pertinent items are noted in HPI.  Exam:  Physical Exam Vitals reviewed.  HENT:     Head: Normocephalic and atraumatic.  Eyes:     Pupils: Pupils are equal, round, and reactive to light.  Cardiovascular:     Rate and Rhythm: Normal rate and regular rhythm.     Heart sounds: Normal heart sounds.  Pulmonary:     Effort: Pulmonary effort is normal.     Breath sounds: Normal breath sounds.  Abdominal:     General: Bowel sounds are normal.     Palpations: Abdomen is soft.  Musculoskeletal:        General: No tenderness or deformity. Normal range of motion.     Cervical back: Normal range of motion.  Lymphadenopathy:     Cervical: No cervical adenopathy.  Skin:    General: Skin is warm and dry.     Findings: No erythema or rash.  Neurological:     Mental Status: She is alert and oriented to person, place, and time.  Psychiatric:        Behavior: Behavior normal.        Thought Content: Thought content normal.        Judgment: Judgment  normal.   Patient Vitals for the past 24 hrs:  BP Temp Temp src Pulse Resp SpO2 Weight   06/28/21 0500 (!) 104/44 -- -- 71 12 100 % --  06/28/21 0445 (!) 101/45 -- -- 73 10 100 % --  06/28/21 0430 (!) 96/43 -- -- 73 11 100 % --  06/28/21 0415 (!) 103/45 -- -- 74 17 100 % --  06/28/21 0400 (!) 100/44 -- -- 72 14 100 % --  06/28/21 0345 (!) 100/46 -- -- 73 13 100 % --  06/28/21 0335 -- (!) 97.4 F (36.3 C) Axillary -- -- -- --  06/28/21 0330 (!) 105/47 -- -- 74 14 100 % --  06/28/21 0315 (!) 92/39 -- -- 75 (!) 9 100 % --  06/28/21 0300 (!) 106/43 -- -- 80 15 100 % --  06/28/21 0230 (!) 86/41 -- -- 74 13 100 % --  06/28/21 0215 (!) 92/41 -- -- 75 13 100 % --  06/28/21 0200 (!) 108/50 -- -- 82 18 100 % --  06/28/21 0145 (!) 69/50 -- -- 82 16 100 % --  06/28/21 0130 103/77 -- -- 88 17 91 % --  06/28/21 0115 (!) 120/52 -- -- 90 (!) 21 98 % --  06/28/21 0100 (!) 118/44 -- -- 85 (!) 21 98 % --  06/28/21 0055 -- -- -- 85 19 94 % --  06/28/21 0054 -- -- -- -- (!) 22 -- --  06/28/21 0053 -- -- -- -- 20 -- --  06/28/21 0052 (!) 130/50 -- -- -- (!) 23 -- --  06/28/21 0051 -- -- -- -- 18 -- --  06/28/21 0050 -- -- -- -- (!) 24 -- --  06/28/21 0045 -- -- -- -- 19 -- --  06/28/21 0030 -- -- -- 83 18 100 % --  06/28/21 0015 (!) 116/52 -- -- 84 17 100 % --  06/28/21 0000 (!) 110/51 -- -- 83 18 100 % --  06/19/2021 2352 -- 98.1 F (36.7 C) Axillary -- -- -- --  06/10/2021 2345 (!) 87/46 -- -- 75 (!) 9 100 % --  06/08/2021 2330 (!) 92/49 -- -- 76 14 100 % --  06/06/2021 2315 (!) 88/44 -- -- 75 14 100 % --  07/02/2021 2300 (!) 89/46 -- -- 79 (!) 22 100 % --  06/05/2021 2245 (!) 100/52 -- -- 84 17 98 % --  06/30/2021 2230 (!) 100/47 -- -- 81 12 100 % --  06/22/2021 2215 (!) 88/44 -- -- 76 14 100 % --  06/10/2021 2200 (!) 79/44 -- -- 77 14 100 % --  07/03/2021 2145 -- -- -- 77 15 100 % --  06/06/2021 2130 (!) 82/43 -- -- 79 15 100 % --  06/15/2021 2115 -- -- -- 76 14 100 % --  06/10/2021 2100 (!) 77/45 -- -- 79 14 100 % --  06/20/2021 2045 -- -- -- 82 13 99 % --  06/17/2021 2030 (!) 98/47 -- -- 92 18 100 % --   06/08/2021 2029 -- (!) 97.4 F (36.3 C) Oral -- -- -- 189 lb 6 oz (85.9 kg)  06/13/2021 2015 -- -- -- 94 (!) 21 99 % --  07/02/2021 1930 (!) 81/39 -- -- 86 13 100 % --  07/03/2021 1915 (!) 77/41 -- -- 86 14 100 % --  06/07/2021 1900 (!) 84/44 -- -- 87 14 100 % --  06/26/2021 1835 (!) 94/44 -- -- 91 15 100 % --  06/04/2021  1800 (!) 91/45 -- -- 89 14 100 % --  06/19/2021 1730 (!) 95/48 -- -- 93 16 100 % --  06/26/2021 1700 (!) 91/40 -- -- 88 14 100 % --  06/13/2021 1630 (!) 81/38 -- -- 86 14 100 % --  06/10/2021 1600 (!) 81/36 -- -- 94 19 99 % --  06/04/2021 1547 (!) 90/41 -- -- 95 20 100 % --  06/23/2021 1530 (!) 79/41 -- -- 97 (!) 21 100 % --  06/03/2021 1500 (!) 96/44 -- -- 99 18 100 % --  06/14/2021 1430 (!) 109/43 -- -- 95 20 100 % --  06/28/2021 1200 (!) 114/50 -- -- (!) 101 (!) 22 100 % 182 lb 15.7 oz (83 kg)  06/18/2021 1130 (!) 103/48 -- -- (!) 101 17 100 % --  06/10/2021 1100 (!) 111/45 -- -- 97 15 100 % --  06/21/2021 1030 (!) 113/48 -- -- 100 16 100 % --  06/18/2021 1000 -- -- -- (!) 101 16 98 % --  06/09/2021 0945 (!) 105/45 -- -- (!) 104 17 (!) 87 % --  06/10/2021 0930 (!) 127/46 -- -- (!) 107 20 (!) 89 % --  06/24/2021 0915 139/65 -- -- (!) 110 20 93 % --  06/10/2021 0845 -- -- -- (!) 105 (!) 21 100 % --      Recent Labs    06/06/2021 0334 06/28/21 0410  WBC 3.7* 18.5*  HGB 9.8* 9.3*  HCT 29.0* 27.2*  PLT PLATELET CLUMPS NOTED ON SMEAR, UNABLE TO ESTIMATE 81*    Recent Labs    06/08/2021 0334 06/28/21 0410  NA 135 141  K 3.9 4.1  CL 110 114*  CO2 20* 20*  GLUCOSE 99 90  BUN 23 25*  CREATININE 1.98* 2.19*  CALCIUM 8.2* 8.1*    Blood smear review: None  Pathology: None    Assessment and Plan: Ms. Cohenour is a 87 year old African-American female.  She has a gastrointestinal stromal tumor.  She had a for 2 years.  This is not progressed at all.  She had a very nice response to [place].  She has tolerated Gleevec pretty well although there has been issues with edema in her legs.  I am not sure how  she got the [place].  Not sure how she got the gram-negative rod bacteremia.  I think she is going to bacteria in her blood.  We will have to wait the sensitivities of the bacteria.  She is on Zithromax and Rocephin.  I suspect the thrombocytopenia is probably from the gram-negative rod bacteremia.  Her blood pressure is doing a little bit better.  Again she is on phenylephrine.  I realize that at her age, that she does not have a lot of reserve.  I totally understand this.  However, she is not going to die from her underlying malignancy.  She has had this for 2 years and it has been stable..  She does have some renal insufficiency which has been chronic.  Again hopefully she will make it through the [place].  I think she is getting remdesivir for this.  We will certainly follow along and try to help out any way that we can.  I know that she will get outstanding care from the wonderful staff on [facility].  I appreciate all of their hard work.  Monique Haw, MD  1 Thessalonians 5:16-18

## 2021-06-28 NOTE — Evaluation (Signed)
Occupational Therapy Evaluation Patient Details Name: Monique Lin MRN: [id number] DOB: 16-Oct-1927 Today's Date: 06/28/2021    History of Present Illness 87 y.o. female admitted with progressively worsening weakness, GI bleed, COVID (+) PCR several days prior to admission with acute respiratory failure and hypoxia, bacteremia and thrombocytopenia. PMHx significant for gastrointestinal stromal tumor, HTN, CKD4 and thyroid disease.   Clinical Impression   PTA patient was living with her daughter in a private residence and was requiring some assist with ADLs/IADLs at baseline. No family present at bedside and patient is a poor historian with inability to provide much information. Patient currently requires Min A for grooming tasks at bedlevel and Max A to Total A +2 for all parts of bed mobility with HOB elevated. Patient also limited by deficits listed below including hypotension, decreased cognition, decreased activity tolerance, decreased sitting balance and decreased cardiopulmonary endurance with sats in low 70's with poor waveform seated EOB and would benefit from continued acute OT services in prep for safe d/c to next level of care with recommendation for SNF rehab.     Follow Up Recommendations  SNF;Supervision/Assistance - 24 hour    Equipment Recommendations  Other (comment) (Defer to next level of care.)    Recommendations for Other Services       Precautions / Restrictions Precautions Precautions: Fall Precaution Comments: Monitor vitals; IV watch; hears best in R ear; prefers to wear eye patch over L eye Restrictions Weight Bearing Restrictions: No      Mobility Bed Mobility Overal bed mobility: Needs Assistance Bed Mobility: Supine to Sit;Sit to Supine     Supine to sit: Max assist;+2 for physical assistance;+2 for safety/equipment Sit to supine: Total assist;+2 for physical assistance;+2 for safety/equipment   General bed mobility comments: Max A +2 to Total A +2 for  all parts of bed mobility requiring heavy assist to bring BLE toward EOB and to elevate trunk with HOB elevated. Total A +2 to assist trunk to bed surface and to bring BLE form EOB to bed surface. Total A +2 for supine scoot toward HOB.    Transfers Overall transfer level: Needs assistance               General transfer comment: Deferred secondary to hypotension.    Balance Overall balance assessment: Needs assistance Sitting-balance support: Bilateral upper extremity supported;Feet unsupported Sitting balance-Leahy Scale: Poor Sitting balance - Comments: Able to maintain static sitting balance at EOB with intermittent Min guard to Min A. Cues to correct chin to chest (although patient not receptive to cueing).                                   ADL either performed or assessed with clinical judgement   ADL Overall ADL's : Needs assistance/impaired     Grooming: Minimal assistance;Bed level Grooming Details (indicate cue type and reason): Able to wash face in supine with set-up assist.             Lower Body Dressing: Total assistance;Bed level Lower Body Dressing Details (indicate cue type and reason): Total A to don footwear in supine.               General ADL Comments: Patient greatly limited by decreased cognition, generalized weakness/debility and decreased activity tolerance.     Vision Baseline Vision/History:  (Reports decreased vision in L eye (wearing eye patch upon entry which patient states in secondary  to [place] Palsy).) Patient Visual Report: No change from baseline Vision Assessment?: Vision impaired- to be further tested in functional context Additional Comments: Able to correctly identify number of therapists fingers presented in central vision; unable to see time on wall clock; difficulty tracking in LU and LL visual fields ?but may have been 2/2 poor command following? Reports some visual deficits at baseline but unable to explain  further.     Perception     Praxis      Pertinent Vitals/Pain Pain Assessment: Faces Faces Pain Scale: Hurts little more Pain Location: RLE with movement Pain Descriptors / Indicators: Grimacing;Guarding;Moaning Pain Intervention(s): Limited activity within patient's tolerance;Monitored during session;Repositioned     Hand Dominance Right   Extremity/Trunk Assessment Upper Extremity Assessment Upper Extremity Assessment: RUE deficits/detail;LUE deficits/detail RUE Deficits / Details: Able to lift RUE against gravity; limited shoulder ROM (unknown if this is baseline as patient frustrated by this therapists questions). Grip strength 3+/5 and symmetrical bilaterally. LUE Deficits / Details: Able to lift RUE against gravity; limited shoulder ROM (unknown if this is baseline as patient frustrated by this therapists questions). Grip strength 3+/5 and symmetrical bilaterally.   Lower Extremity Assessment Lower Extremity Assessment: Defer to PT evaluation   Cervical / Trunk Assessment Cervical / Trunk Assessment: Kyphotic;Other exceptions Cervical / Trunk Exceptions: Large body habitus.   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)   Cognition Arousal/Alertness: Awake/alert;Lethargic (Lethargic initially requiring increased time to arouse.) Behavior During Therapy: Agitated Overall Cognitive Status: No family/caregiver present to determine baseline cognitive functioning Area of Impairment: Orientation;Attention;Memory;Following commands;Awareness;Problem solving                 Orientation Level: Disoriented to;Situation;Time;Place Current Attention Level: Sustained Memory: Decreased short-term memory Following Commands: Follows one step commands with increased time;Follows one step commands inconsistently   Awareness: Intellectual Problem Solving: Slow processing General Comments: Patient able to state name and DOB. Able to  state that she was in the hosptial but required multi-choice cues. Able to state month but not year. Patient easily agitated by PT/OT's questions throughout assessment making information retreival a hardship.   General Comments  Upon entry SpO2 100% on 2L O2 via [place] with BP 100/44. Seated EOB BP 115/64. SpO2 initially in high 90's upon transition to EOB but dropped to mid 70's with poor pleth. Upon return to supine BP 108/51 and SpO2 returned to >95%.    Exercises     Shoulder Instructions      Home Living Family/patient expects to be discharged to:: Private residence Living Arrangements: Children                               Additional Comments: Patient is a poor historian; becomes frustrated/upset with PLOF and home set-up questions. Will continue to assess.      Prior Functioning/Environment Level of Independence: Needs assistance  Gait / Transfers Assistance Needed: Unknown ADL's / Homemaking Assistance Needed: Patient was requiring assist with ADLs/IADLs since onset of COVID-19. Reports being able to dress and bathe herself prior to getting sick but patient is a questionable historian. Will need to confirm this information.            OT Problem List: Decreased strength;Decreased range of motion;Decreased activity tolerance;Impaired balance (sitting and/or standing);Decreased cognition;Decreased knowledge of use of DME or AE;Cardiopulmonary status limiting activity;Obesity;Impaired UE functional use;Pain;Increased edema      OT Treatment/Interventions: Self-care/ADL training;Therapeutic exercise;Energy  conservation;DME and/or AE instruction;Therapeutic activities;Visual/perceptual remediation/compensation;Patient/family education;Balance training    OT Goals(Current goals can be found in the care plan section) Acute Rehab OT Goals Patient Stated Goal: No goals stated. OT Goal Formulation: Patient unable to participate in goal setting Time For Goal Achievement:  07/12/21 Potential to Achieve Goals: Fair ADL Goals Additional ADL Goal #1: Patient will follow 1-step verbal commands with 95% accuracy in prep for ADLs. Additional ADL Goal #2: Patient will maintain static sitting balance at EOB with supervision A for 10-15 minutes in prep for seated ADLs. Additional ADL Goal #3: Patient will transition to [facility] via stand-pivot transfer with Mod A +1.  OT Frequency: Min 2X/week   Barriers to D/C:            Co-evaluation              AM-PAC OT "6 Clicks" Daily Activity     Outcome Measure Help from another person eating meals?: A Lot Help from another person taking care of personal grooming?: A Lot Help from another person toileting, which includes using toliet, bedpan, or urinal?: Total Help from another person bathing (including washing, rinsing, drying)?: Total Help from another person to put on and taking off regular upper body clothing?: A Lot Help from another person to put on and taking off regular lower body clothing?: Total 6 Click Score: 9   End of Session Equipment Utilized During Treatment: Oxygen Nurse Communication: Mobility status;Other (comment) (Response to treatment.)  Activity Tolerance: Treatment limited secondary to agitation;Patient limited by fatigue Patient left: in bed;with call bell/phone within reach;with bed alarm set  OT Visit Diagnosis: Unsteadiness on feet (R26.81);Muscle weakness (generalized) (M62.81);Pain Pain - Right/Left:  (Bilaterally) Pain - part of body: Leg                Time: 1027-2536 OT Time Calculation (min): 33 min Charges:  OT General Charges $OT Visit: 1 Visit OT Evaluation $OT Eval Moderate Complexity: 1 Mod OT Treatments $Therapeutic Activity: 8-22 mins  Shaquille Janes H. OTR/L Supplemental OT, Department of rehab services [phone]  Hanifa Antonetti R H. 06/28/2021, 9:48 AM

## 2021-06-28 NOTE — Procedures (Signed)
Interventional Radiology Procedure Note  Procedure:   1.) PTC with failed PBD  - pt too agitated and difficult to sedate - colonic interposition excludes left sided approach - dilated centrally but minimally peripherally  2.) 93F percutaneous cholecystostomy tube placed to provide some drainage  Complications: None  Estimated Blood Loss: None  Recommendations: - Bag to gravity - Flush 5 mL saline Q shift - Consider return to IR for 2nd attempt at PBD placement in 1-2 days versus ERCP.  If IR, may need anesthesia support for propofol.     Signed,  Criselda Peaches, MD

## 2021-06-28 NOTE — Progress Notes (Signed)
  Echocardiogram 2D Echocardiogram has been performed.  Monique Lin 06/28/2021, 11:51 AM

## 2021-06-28 NOTE — Progress Notes (Signed)
Albuterol 2.5mg  PRN changed back to Albuterol 2p PRN per RT protocol due to pt being Covid positive . Will attempt to give inhaler using spacer with mask.

## 2021-06-28 NOTE — Progress Notes (Addendum)
NAME:  Monique Lin, MRN:  [id number], DOB:  [birth year], LOS: 1 ADMISSION DATE:  07/02/2021, CONSULTATION DATE:  7/25 REFERRING MD:  Dr. Lorin Mercy, CHIEF COMPLAINT:  Hypotension, COVID   History of Present Illness:  87 year old female with PMH as below, which is significant for gastrointestinal stromal tumor followed by Dr. Marin Olp on treatement with Gleevec, HTN, CKD, and hypothyroidism. She presented to [facility] ED 7/25 with complaints of malaise, body aches, and shortness of breath. Tested positive for COVID at home on the day of presentation. She is status post the initial series of vaccination in 2021. Upon arrival to the emergency department she was noted to be hypoxemic to the mid 80s on room air. She was placed on 2L nasal cannula and sats improved to the high 90s. COVID testing in the ED was confirmed positive. She was admitted to the hospitalists service. COVID labs were ordered including D. Dimer, which was positive. VQ scan was obtained with low probability of PE. As the day progressed she became hypotensive despite fluid bolus. PCCM consulted.   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   Significant Hospital Events: Including procedures, antibiotic start and stop dates in addition to other pertinent events   7/25 admit for COVID, hypotension  Interim History / Subjective:  Patient awake this morning. Complaining of upper abdominal pain that started shortly after taking her oral medications. Slightly worse with inhalation.   Objective   Blood pressure (!) 104/44, pulse 71, temperature (!) 97.4 F (36.3 C), temperature source Axillary, resp. rate 12, weight 85.9 kg, SpO2 100 %.        Intake/Output Summary (Last 24 hours) at 06/28/2021 0740 Last data filed at 06/28/2021 0500 Gross per 24 hour  Intake 1034.56 ml  Output 300 ml   Net 734.56 ml   Filed Weights   06/05/2021 1200 07/02/2021 2029  Weight: 83 kg 85.9 kg    Examination: General: Obese elderly female, appears uncomfortable  HENT: [place]/AT, PERRL, no JVD Lungs: Clear bilateral breath sounds, wheezing or crackles, no accessory muscle use Cardiovascular: RRR, no MRG Abdomen: Soft, diffusely TTP greatest in the LUQ, no masses, active bowel sounds Extremities: N0 acute deformity. 1+ edema of LE bilaterally Neuro: Aox4, no focal weakness  Resolved Hospital Problem list     Assessment & Plan:  Septic shock secondary to gram negative bacteremia  BCID with E. Coli and klebsiella. No clear source at this time - Levophed as needed to maintain MAP >65, stop phenylephrine  - Continue ceftriaxone, stop azithromycin  - Continue midodrine - US abdomen complete, consider CT abdomen if negative and not improving - UA, urine cultures - Follow up blood cultures  - Hold coreg, losartan  Acute hypoxemic respiratory failure COVID pneumonia Stable on 2 L [place] - Supplemental O2 to keep SpO2 > 92% - Hydrocortisone 50 q6  - Trend LDH, Troponin, Ferritin, CRP - Remdesivir for 3 days given history of GIST   AKI on CKD sCr 2.19 this morning with BUN of 25.  - May need more IVF if not improving with levo - monitor I/Os - monitor BMP, avoid nephrotoxins  Transminitis Elevated AST, ALT, Tbili 1.9. with LUQ abdominal pain - US abdomen - Monitor CMP  Anemia: hemoglobin above baseline on admission - Epoetin alfa weekly, last dose 06/28/2021 - Trend hemoglobin  Gastrointestinal stromal tumor: followed by Dr.  Ennever.  - On Gleevec, tumor stable by recent imaging.   Hypothyroid - Synthroid  Best Practice (right click and "Reselect all SmartList Selections" daily)  Diet/type: clear liquids and NPO DVT prophylaxis: LMWH GI prophylaxis: PPI Lines: N/A Foley:  N/A Code Status:  full code Last date of multidisciplinary goals of care discussion [7/25 Dr Ruthann Cancer and  Daughter]  Labs   CBC: Recent Labs  Lab 06/24/2021 0334 06/28/21 0410  WBC 3.7* 18.5*  NEUTROABS  --  17.2*  HGB 9.8* 9.3*  HCT 29.0* 27.2*  MCV 96.3 94.1  PLT PLATELET CLUMPS NOTED ON SMEAR, UNABLE TO ESTIMATE 81*     Basic Metabolic Panel: Recent Labs  Lab 06/17/2021 0334 06/28/21 0410  NA 135 141  K 3.9 4.1  CL 110 114*  CO2 20* 20*  GLUCOSE 99 90  BUN 23 25*  CREATININE 1.98* 2.19*  CALCIUM 8.2* 8.1*  MG  --  1.4*  PHOS  --  5.0*    GFR: Estimated Creatinine Clearance: 17 mL/min (A) (by C-G formula based on SCr of 2.19 mg/dL (H)). Recent Labs  Lab 06/04/2021 0334 06/07/2021 0902 06/28/21 0410  PROCALCITON  --  4.54  --   WBC 3.7*  --  18.5*  LATICACIDVEN  --  1.6  --      Liver Function Tests: Recent Labs  Lab 06/28/21 0410  AST 221*  ALT 109*  ALKPHOS 354*  BILITOT 1.9*  PROT 4.5*  ALBUMIN 2.1*   No results for input(s): LIPASE, AMYLASE in the last 168 hours. No results for input(s): AMMONIA in the last 168 hours.  ABG    Component Value Date/Time   PHART 7.472 (H) 04/02/2018 0158   PCO2ART 23.8 (L) 04/02/2018 0158   PO2ART 191.0 (H) 04/02/2018 0158   HCO3 17.5 (L) 04/02/2018 0158   TCO2 18 (L) 04/02/2018 0158   ACIDBASEDEF 5.0 (H) 04/02/2018 0158   O2SAT 100.0 04/02/2018 0158      Coagulation Profile: No results for input(s): INR, PROTIME in the last 168 hours.  Cardiac Enzymes: Recent Labs  Lab 06/26/2021 2114  CKTOTAL 341*    HbA1C: No results found for: HGBA1C  CBG: Recent Labs  Lab 06/17/2021 2349 06/28/21 0318 06/28/21 0733  GLUCAP 101* 99 84    Review of Systems:   Patient is encephalopathic and/or intubated. Therefore history has been obtained from chart review.   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   Surgical History:   Past Surgical History:  Procedure  Laterality Date   BIOPSY  01/12/2020   Procedure: BIOPSY;  Surgeon: Ronald Lobo, MD;  Location: [place];  Service: Endoscopy;;   ESOPHAGOGASTRODUODENOSCOPY (EGD) WITH PROPOFOL N/A 01/12/2020   Procedure: ESOPHAGOGASTRODUODENOSCOPY (EGD) WITH PROPOFOL;  Surgeon: Ronald Lobo, MD;  Location: [place];  Service: Endoscopy;  Laterality: N/A;   ESOPHAGOGASTRODUODENOSCOPY (EGD) WITH PROPOFOL N/A 01/15/2020   Procedure: ESOPHAGOGASTRODUODENOSCOPY (EGD) WITH PROPOFOL;  Surgeon: Arta Silence, MD;  Location: [place];  Service: Endoscopy;  Laterality: N/A;   EUS N/A 01/15/2020   Procedure: UPPER ENDOSCOPIC ULTRASOUND (EUS) RADIAL;  Surgeon: Arta Silence, MD;  Location: [place];  Service: Endoscopy;  Laterality: N/A;   FINE NEEDLE ASPIRATION  01/15/2020   Procedure: FINE NEEDLE ASPIRATION (FNA) LINEAR;  Surgeon: Arta Silence, MD;  Location: [facility] ENDOSCOPY;  Service: Endoscopy;;   IR CATHETER TUBE CHANGE  04/14/2018   IR  THORACENTESIS ASP PLEURAL SPACE W/IMG GUIDE  04/05/2018   NASAL SINUS SURGERY     stomach tumor removal       Social History:   reports that she has never smoked. She has never used smokeless tobacco. She reports that she does not drink alcohol and does not use drugs.   Family History:  Her Family history is unknown by patient.   Allergies Allergies  Allergen Reactions   Codeine Other (See Comments)    Stomach cramps   Penicillins Other (See Comments)    "Bad stomach cramps" Has patient had a PCN reaction causing immediate rash, facial/tongue/throat swelling, SOB or lightheadedness with hypotension: Unk Has patient had a PCN reaction causing severe rash involving mucus membranes or skin necrosis: Unk Has patient had a PCN reaction that required hospitalization: Unk Has patient had a PCN reaction occurring within the last 10 years: No If all of the above answers are "NO", then may proceed with Cephalosporin use.       Home Medications  Prior to  Admission medications   Medication Sig Start Date End Date Taking? Authorizing Provider  acetaminophen (TYLENOL) 650 MG CR tablet Take 650 mg by mouth every 8 (eight) hours as needed for pain.   Yes [provider]  albuterol (PROVENTIL) (2.5 MG/3ML) 0.083% nebulizer solution Take 2.5 mg by nebulization in the morning, at noon, in the evening, and at bedtime.   Yes [provider]  albuterol (VENTOLIN HFA) 108 (90 Base) MCG/ACT inhaler Inhale 1 puff into the lungs every 6 (six) hours as needed for wheezing or shortness of breath.   Yes [provider]  ALLEGRA ALLERGY 180 MG tablet Take 180 mg by mouth in the morning.   Yes [provider]  alum & mag hydroxide-simeth (MAALOX/MYLANTA) 200-200-20 MG/5ML suspension Take 30 mLs by mouth every 6 (six) hours as needed for indigestion, heartburn or flatulence. 12/22/19  Yes [name], MD  artificial tears (LACRILUBE) OINT ophthalmic ointment Place into the left eye at bedtime. 02/01/21  Yes Elodia Florence., MD  atorvastatin (LIPITOR) 20 MG tablet Take 20 mg by mouth daily.   Yes [provider]  bisacodyl (DULCOLAX) 5 MG EC tablet Take 1 tablet (5 mg total) by mouth daily as needed for moderate constipation. 12/22/19  Yes [name], MD  carvedilol (COREG) 6.25 MG tablet Take 1 tablet (6.25 mg total) by mouth 2 (two) times daily with a meal. 01/30/20  Yes Ennever, Rudell Cobb, MD  Cholecalciferol (VITAMIN D-3) 125 MCG (5000 UT) TABS Take 5,000 Units by mouth daily.    Yes [provider]  Cyanocobalamin (VITAMIN B-12 PO) Take 500 mcg by mouth daily.    Yes [provider]  folic acid (FOLVITE) 782 MCG tablet Take 800 mcg by mouth daily.    Yes [provider]  gabapentin (NEURONTIN) 300 MG capsule Take 1 capsule (300 mg total) by mouth 2 (two) times daily. 01/16/20  Yes Mercy Riding, MD  hydroxypropyl methylcellulose / hypromellose (ISOPTO TEARS / GONIOVISC) 2.5 % ophthalmic  solution Place 1 drop into the left eye 3 (three) times daily. Patient taking differently: Place 1 drop into the left eye 3 (three) times daily. Per daughter, she also uses it in the right eye but not often. Just as needed. 02/01/21  Yes Elodia Florence., MD  imatinib (GLEEVEC) 400 MG tablet Take 1 tablet (400 mg total) by mouth daily. Take with meals and large glass of water.Caution:Chemotherapy 04/22/21  Yes Volanda Napoleon, MD  losartan (COZAAR) 25 MG tablet Take 25 mg by mouth daily.   Yes [provider]  ondansetron (ZOFRAN) 8 MG tablet Take 1 tablet (8 mg total) by mouth every 8 (eight) hours as needed for nausea or vomiting. 03/15/20  Yes Ennever, Rudell Cobb, MD  OVER THE COUNTER MEDICATION Apply 1 application topically See admin instructions. HempVana cream - as needed for knee and shoulder cream   Yes [provider]  pantoprazole (PROTONIX) 40 MG tablet TAKE 1 TABLET(40 MG) BY MOUTH TWICE DAILY Patient taking differently: Take 40 mg by mouth in the morning and at bedtime. 09/22/20  Yes Ennever, Rudell Cobb, MD  SYNTHROID 50 MCG tablet Take 50 mcg by mouth daily before breakfast.   Yes [provider]  trolamine salicylate (ASPERCREME) 10 % cream Apply 1 application topically as needed for muscle pain.   Yes [provider]  vitamin E 180 MG (400 UNITS) capsule Take 400 Units by mouth daily.   Yes [provider]  zinc gluconate 50 MG tablet Take 50 mg by mouth daily.   Yes [provider]    Iona Beard, MD Internal medicine, PGY-2 06/28/21 10:34 AM Pager [phone]

## 2021-06-28 NOTE — Progress Notes (Signed)
eLink Physician-Brief Progress Note Patient Name: Monique Lin DOB: [birth year] MRN: [id number]   Date of Service  06/28/2021  HPI/Events of Note  Patient not cooperative using Albuterol MDI.  eICU Interventions  Plan: D/C Albuterol MDI. Albuterol 2.5 mg via neb Q 3 hours PRN SOB or wheezing.      Intervention Category Major Interventions: Other:  Lysle Dingwall 06/28/2021, 9:27 PM

## 2021-06-28 NOTE — Plan of Care (Signed)
  Problem: Health Behavior/Discharge Planning: Goal: Ability to manage health-related needs will improve Outcome: Not Progressing   

## 2021-06-28 NOTE — Evaluation (Signed)
Physical Therapy Evaluation Patient Details Name: Monique Lin MRN: [id number] DOB: [birth year] Today's Date: 06/28/2021   History of Present Illness  87 y.o. female admitted with progressively worsening weakness, GI bleed, COVID (+) PCR several days prior to admission with acute respiratory failure and hypoxia, bacteremia and thrombocytopenia. Patient also hypotensive on pressors. PMHx significant for gastrointestinal stromal tumor, HTN, CKD4 and thyroid disease.  Clinical Impression   Pt admitted secondary to problem above with deficits below. PTA patient was living with daughter and her functional abilities had recently declined (unclear exactly what she was able to do due to pt poor historian).  Pt currently requires +2 max to total assist for coming to sit EOB and returning to supine. Unable to attempt standing or transfer due to appeared she had desaturated and due to global weakness, with pt returned to supine. Sats quickly returned to 96% once supine.  Anticipate patient will benefit from PT to address problems listed below.Will continue to follow acutely to maximize functional mobility independence and safety.       Follow Up Recommendations SNF;Supervision/Assistance - 24 hour    Equipment Recommendations  Wheelchair (measurements PT);Wheelchair cushion (measurements PT);Hospital bed    Recommendations for Other Services       Precautions / Restrictions Precautions Precautions: Fall Precaution Comments: Monitor vitals; IV watch; hears best in R ear; prefers to wear eye patch over L eye with reported h/o Bells Palsy Restrictions Weight Bearing Restrictions: No      Mobility  Bed Mobility Overal bed mobility: Needs Assistance Bed Mobility: Supine to Sit;Sit to Supine     Supine to sit: Max assist;+2 for physical assistance;+2 for safety/equipment Sit to supine: Total assist;+2 for physical assistance;+2 for safety/equipment   General bed mobility comments: Max A +2 to  Total A +2 for all parts of bed mobility requiring heavy assist to bring BLE toward EOB and to elevate trunk with HOB elevated. Total A +2 to assist trunk to bed surface and to bring BLE form EOB to bed surface. Total A +2 for supine scoot toward HOB.    Transfers Overall transfer level: Needs assistance               General transfer comment: Deferred secondary to ?able O2 saturation--monitor with poor pleth, but reading 77% with pulse matching HR via telemetry. attempted to get better reading x 2 minutes and then returned to supine. Once returned to supine, pleth quickly improved and sats 96%.  Ambulation/Gait                Stairs            Wheelchair Mobility    Modified Rankin (Stroke Patients Only)       Balance Overall balance assessment: Needs assistance Sitting-balance support: Bilateral upper extremity supported;Feet unsupported Sitting balance-Leahy Scale: Poor Sitting balance - Comments: Able to maintain static sitting balance at EOB with intermittent Min guard to Min A. Cues to correct chin to chest (although patient not receptive to cueing).                                     Pertinent Vitals/Pain Pain Assessment: Faces Faces Pain Scale: Hurts little more Pain Location: RLE with movement Pain Descriptors / Indicators: Grimacing;Guarding;Moaning Pain Intervention(s): Limited activity within patient's tolerance;Monitored during session;Repositioned    Home Living Family/patient expects to be discharged to:: Private residence Living Arrangements: Children  Additional Comments: Patient is a poor historian; becomes frustrated/upset with PLOF and home set-up questions. Will continue to assess.    Prior Function Level of Independence: Needs assistance   Gait / Transfers Assistance Needed: Unknown  ADL's / Homemaking Assistance Needed: Patient was requiring assist with ADLs/IADLs since onset of COVID-19. Reports  being able to dress and bathe herself prior to getting sick but patient is a questionable historian. Will need to confirm this information.  Comments: patient becoming frustrated with multiple questions and ?able historian     Hand Dominance   Dominant Hand: Right    Extremity/Trunk Assessment   Upper Extremity Assessment Upper Extremity Assessment: Defer to OT evaluation RUE Deficits / Details: Able to lift RUE against gravity; limited shoulder ROM (unknown if this is baseline as patient frustrated by this therapists questions). Grip strength 3+/5 and symmetrical bilaterally. LUE Deficits / Details: Able to lift RUE against gravity; limited shoulder ROM (unknown if this is baseline as patient frustrated by this therapists questions). Grip strength 3+/5 and symmetrical bilaterally.    Lower Extremity Assessment Lower Extremity Assessment: Generalized weakness;RLE deficits/detail;LLE deficits/detail RLE Deficits / Details: wiggles foot; unable to lift leg off bed despite trial, incr pain with attempt to flex knee while in supine (at EOB knee flexed to ~60) LLE Deficits / Details: wiggles foot; able to initiate lifting leg off bed (2+), bends knee with assist 2+, extends leg with weak push 3-    Cervical / Trunk Assessment Cervical / Trunk Assessment: Kyphotic;Other exceptions Cervical / Trunk Exceptions: Large body habitus.  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)  Cognition Arousal/Alertness: Awake/alert;Lethargic (Lethargic initially requiring increased time to arouse.) Behavior During Therapy: Agitated Overall Cognitive Status: No family/caregiver present to determine baseline cognitive functioning Area of Impairment: Orientation;Attention;Memory;Following commands;Awareness;Problem solving                 Orientation Level: Disoriented to;Situation;Time;Place Current Attention Level: Sustained Memory: Decreased short-term  memory Following Commands: Follows one step commands with increased time;Follows one step commands inconsistently   Awareness: Intellectual Problem Solving: Slow processing;Decreased initiation;Difficulty sequencing;Requires verbal cues;Requires tactile cues General Comments: Patient able to state name and DOB. Able to state that she was in the hosptial but required multi-choice cues. Able to state month but not year. Patient easily agitated by PT/OT's questions throughout assessment making information retreival a hardship.      General Comments General comments (skin integrity, edema, etc.): Upon entry SpO2 100% on 2L O2 via [place] with BP 100/44. Seated EOB BP 115/64. SpO2 initially in high 90's upon transition to EOB but dropped to mid 70's with poor pleth. Upon return to supine BP 108/51 and SpO2 returned to >95%.    Exercises     Assessment/Plan    PT Assessment Patient needs continued PT services  PT Problem List Decreased strength;Decreased activity tolerance;Decreased balance;Decreased mobility;Decreased cognition;Decreased knowledge of use of DME;Decreased safety awareness;Decreased knowledge of precautions;Cardiopulmonary status limiting activity;Obesity;Pain       PT Treatment Interventions DME instruction;Gait training;Functional mobility training;Therapeutic activities;Therapeutic exercise;Balance training;Cognitive remediation;Patient/family education    PT Goals (Current goals can be found in the Care Plan section)  Acute Rehab PT Goals Patient Stated Goal: No goals stated. PT Goal Formulation: Patient unable to participate in goal setting Time For Goal Achievement: 07/12/21 Potential to Achieve Goals: Fair    Frequency Min 2X/week   Barriers to discharge        Co-evaluation  AM-PAC PT "6 Clicks" Mobility  Outcome Measure Help needed turning from your back to your side while in a flat bed without using bedrails?: Total Help needed moving from  lying on your back to sitting on the side of a flat bed without using bedrails?: Total Help needed moving to and from a bed to a chair (including a wheelchair)?: Total Help needed standing up from a chair using your arms (e.g., wheelchair or bedside chair)?: Total Help needed to walk in hospital room?: Total Help needed climbing 3-5 steps with a railing? : Total 6 Click Score: 6    End of Session Equipment Utilized During Treatment: Oxygen Activity Tolerance: Patient limited by fatigue;Treatment limited secondary to medical complications (Comment) (appeared low sats with incr WOB) Patient left: in bed;with call bell/phone within reach;with bed alarm set Nurse Communication: Mobility status PT Visit Diagnosis: Muscle weakness (generalized) (M62.81);Difficulty in walking, not elsewhere classified (R26.2)    Time: 7289-7915 PT Time Calculation (min) (ACUTE ONLY): 36 min   Charges:   PT Evaluation $PT Eval Moderate Complexity: 1 Mod           Arby Barrette, PT Pager [phone]   Rexanne Mano 06/28/2021, 10:44 AM

## 2021-06-28 NOTE — Consult Note (Signed)
Consultation Note Date: 06/28/2021   Patient Name: Monique Lin  DOB: [birth year]  MRN: [id number]  Age / Sex: 87 y.o., female  PCP: [name], MD Referring Physician: Margaretha Seeds, MD  Reason for Consultation: Establishing goals of care  HPI/Patient Profile: 87 y.o. female  with past medical history of GIST- stable currently on [place] last scan on 06/08/21 without progression, anemia (recently transfused at cancer center) admitted on 06/18/2021 with COVID+, septic shock- requiring IV pressors- bacteremia growing E. Coli and Klebsiella. Workup has revealed possible GI source of infection with increased biliary ductal dilation, cholelithiasis and sludge. Her Cr is trending up. Palliative consulted for [place].    Clinical Assessment and Goals of Care: Consult received, chart reviewed. Patient discussed with bedside RN and Dr. Loanne Drilling.  Patient's respiratory status is stable.  She is awake and moaning in pain. Per nursing communication is difficult.  Called her daughterLeatta Lin.  Per [name] - prior to this admission- patient was living at home with her other daughterGeorga Lin. Family assists her some with ADL's. She ambulates minimally in the home. Is able to feed herself. Her appetite has been good except for the past two weeks when they noticed she had been eating less. She has not lost weight.  I discussed patient's current status and possible trajectories.  [name] and her sister verbalized their understanding of the seriousness of Ms. Biel illness. We discussed her possible barriers to recovery.  They note that for now goals are to continue to treat what is treatable. They would not want patient to undergo surgery and it is unlikely that this will even be offered as an intervention.  They are interested in getting an update from the medical team regarding her status and possible options for  treatment.   Primary Decision Maker NEXT OF KIN    SUMMARY OF RECOMMENDATIONS -Continue current plan -DNR -Family would like an update call from patient's attending physician -No surgery- they may be open to a drain by IR if this is offered -PMT will f/u with family tomorrow- we discussed the severity of Ms. Lorge illness and the fact that goals of care will continue to need to be re-evaluated based on her status    Code Status/Advance Care Planning: DNR   Prognosis:   Unable to determine  Discharge Planning: To Be Determined  Primary Diagnoses: Present on Admission:  Acute hypoxemic respiratory failure due to COVID-19 [facility])  Gastrointestinal stromal tumor (GIST) associated with mutation in KIT gene ([place])  Hypertension  Essential hypertension  Hypothyroidism  Stage 4 chronic kidney disease ([place])  Acute hypoxemic respiratory failure ([place])   I have reviewed the medical record, interviewed the patient and family, and examined the patient. The following aspects are pertinent.  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 ([place]) 03/10/2020   Hypertension    Hypothyroidism    Social History   Socioeconomic History   Marital status: Widowed  Spouse name: Not on file   Number of children: Not on file   Years of education: Not on file   Highest education level: Not on file  Occupational History   Not on file  Tobacco Use   Smoking status: Never   Smokeless tobacco: Never  Vaping Use   Vaping Use: Never used  Substance and Sexual Activity   Alcohol use: No   Drug use: No   Sexual activity: Not Currently  Other Topics Concern   Not on file  Social History Narrative   Not on file   Social Determinants of Health   Financial Resource Strain: Not on file  Food Insecurity: Not on file  Transportation Needs: Not on file  Physical Activity: Not on file  Stress: Not on file  Social  Connections: Not on file   Scheduled Meds:  artificial tears   Left Eye QHS   vitamin C  500 mg Oral Daily   atorvastatin  20 mg Oral Daily   Chlorhexidine Gluconate Cloth  6 each Topical Daily   docusate sodium  100 mg Oral BID   enoxaparin (LOVENOX) injection  30 mg Subcutaneous Q24H   epoetin alfa  40,000 Units Subcutaneous Q Tue   hydrocortisone sodium succinate  50 mg Intravenous Q6H   levothyroxine  50 mcg Oral QAC breakfast   loratadine  10 mg Oral Daily   mouth rinse  15 mL Mouth Rinse BID   midodrine  10 mg Oral TID WC   pantoprazole  40 mg Oral BID   polyvinyl alcohol  1 drop Left Eye TID   sodium chloride flush  3 mL Intravenous Q12H   sodium chloride flush  3 mL Intravenous Q12H   zinc sulfate  220 mg Oral Daily   Continuous Infusions:  sodium chloride     sodium chloride 5 mL/hr at 06/28/21 1300   sodium chloride Stopped (06/28/21 0100)   cefTRIAXone (ROCEPHIN)  IV 2 g (06/28/21 1324)   metronidazole 500 mg (06/28/21 1525)   norepinephrine (LEVOPHED) Adult infusion 6 mcg/min (06/28/21 1300)   remdesivir 100 mg in NS 100 mL Stopped (06/28/21 1211)   PRN Meds:.sodium chloride, acetaminophen, albuterol, bisacodyl, chlorpheniramine-HYDROcodone, guaiFENesin-dextromethorphan, ondansetron **OR** ondansetron (ZOFRAN) IV, polyethylene glycol, sodium chloride flush, sodium phosphate Medications Prior to Admission:  Prior to Admission medications   Medication Sig Start Date End Date Taking? Authorizing Provider  acetaminophen (TYLENOL) 650 MG CR tablet Take 650 mg by mouth every 8 (eight) hours as needed for pain.   Yes [provider]  albuterol (PROVENTIL) (2.5 MG/3ML) 0.083% nebulizer solution Take 2.5 mg by nebulization in the morning, at noon, in the evening, and at bedtime.   Yes [provider]  albuterol (VENTOLIN HFA) 108 (90 Base) MCG/ACT inhaler Inhale 1 puff into the lungs every 6 (six) hours as needed for wheezing or shortness of breath.   Yes  [provider]  ALLEGRA ALLERGY 180 MG tablet Take 180 mg by mouth in the morning.   Yes [provider]  alum & mag hydroxide-simeth (MAALOX/MYLANTA) 200-200-20 MG/5ML suspension Take 30 mLs by mouth every 6 (six) hours as needed for indigestion, heartburn or flatulence. 12/22/19  Yes [name], MD  artificial tears (LACRILUBE) OINT ophthalmic ointment Place into the left eye at bedtime. 02/01/21  Yes Monique Florence., MD  atorvastatin (LIPITOR) 20 MG tablet Take 20 mg by mouth daily.   Yes [provider]  bisacodyl (DULCOLAX) 5 MG EC tablet Take 1 tablet (  5 mg total) by mouth daily as needed for moderate constipation. 12/22/19  Yes [name], MD  carvedilol (COREG) 6.25 MG tablet Take 1 tablet (6.25 mg total) by mouth 2 (two) times daily with a meal. 01/30/20  Yes Ennever, Rudell Cobb, MD  Cholecalciferol (VITAMIN D-3) 125 MCG (5000 UT) TABS Take 5,000 Units by mouth daily.    Yes [provider]  Cyanocobalamin (VITAMIN B-12 PO) Take 500 mcg by mouth daily.    Yes [provider]  folic acid (FOLVITE) 314 MCG tablet Take 800 mcg by mouth daily.    Yes [provider]  gabapentin (NEURONTIN) 300 MG capsule Take 1 capsule (300 mg total) by mouth 2 (two) times daily. 01/16/20  Yes Mercy Riding, MD  hydroxypropyl methylcellulose / hypromellose (ISOPTO TEARS / GONIOVISC) 2.5 % ophthalmic solution Place 1 drop into the left eye 3 (three) times daily. Patient taking differently: Place 1 drop into the left eye 3 (three) times daily. Per daughter, she also uses it in the right eye but not often. Just as needed. 02/01/21  Yes Monique Florence., MD  imatinib (GLEEVEC) 400 MG tablet Take 1 tablet (400 mg total) by mouth daily. Take with meals and large glass of water.Caution:Chemotherapy 04/22/21  Yes Volanda Napoleon, MD  losartan (COZAAR) 25 MG tablet Take 25 mg by mouth daily.   Yes [provider]  ondansetron (ZOFRAN) 8 MG tablet  Take 1 tablet (8 mg total) by mouth every 8 (eight) hours as needed for nausea or vomiting. 03/15/20  Yes Ennever, Rudell Cobb, MD  OVER THE COUNTER MEDICATION Apply 1 application topically See admin instructions. HempVana cream - as needed for knee and shoulder cream   Yes [provider]  pantoprazole (PROTONIX) 40 MG tablet TAKE 1 TABLET(40 MG) BY MOUTH TWICE DAILY Patient taking differently: Take 40 mg by mouth in the morning and at bedtime. 09/22/20  Yes Ennever, Rudell Cobb, MD  SYNTHROID 50 MCG tablet Take 50 mcg by mouth daily before breakfast.   Yes [provider]  trolamine salicylate (ASPERCREME) 10 % cream Apply 1 application topically as needed for muscle pain.   Yes [provider]  vitamin E 180 MG (400 UNITS) capsule Take 400 Units by mouth daily.   Yes [provider]  zinc gluconate 50 MG tablet Take 50 mg by mouth daily.   Yes [provider]   Allergies  Allergen Reactions   Codeine Other (See Comments)    Stomach cramps   Penicillins Other (See Comments)    "Bad stomach cramps" Has patient had a PCN reaction causing immediate rash, facial/tongue/throat swelling, SOB or lightheadedness with hypotension: Unk Has patient had a PCN reaction causing severe rash involving mucus membranes or skin necrosis: Unk Has patient had a PCN reaction that required hospitalization: Unk Has patient had a PCN reaction occurring within the last 10 years: No If all of the above answers are "NO", then may proceed with Cephalosporin use.     Review of Systems  Physical Exam  Vital Signs: BP (!) 118/48   Pulse 80   Temp 97.7 F (36.5 C) (Oral)   Resp 16   Wt 85.9 kg   SpO2 100%   BMI 33.55 kg/m  Pain Scale: 0-10   Pain Score: 8    SpO2: SpO2: 100 % O2 Device:SpO2: 100 % O2 Flow Rate: .O2 Flow Rate (L/min): 1 L/min  IO: Intake/output summary:  Intake/Output Summary (Last 24 hours) at 06/28/2021  [place] filed at 06/28/2021 1500 Gross  per 24 hour  Intake 1576.33 ml  Output 300 ml  Net 1276.33 ml    LBM: Last BM Date: 06/28/21 Baseline Weight: Weight: 83 kg Most recent weight: Weight: 85.9 kg     Palliative Assessment/Data: PPS: 40%     Thank you for this consult. Palliative medicine will continue to follow and assist as needed.   Time In: 1354, 1542 Time Out: 1405, 1627 Time Total: 56 minutes Greater than 50%  of this time was spent counseling and coordinating care related to the above assessment and plan.  Signed by: Mariana Kaufman, AGNP-C Palliative Medicine    Please contact Palliative Medicine Team phone at [phone] for questions and concerns.  For individual provider: See Shea Evans

## 2021-06-29 ENCOUNTER — Inpatient Hospital Stay (HOSPITAL_COMMUNITY): Payer: Medicare Other

## 2021-06-29 DIAGNOSIS — J9601 Acute respiratory failure with hypoxia: Secondary | ICD-10-CM | POA: Diagnosis not present

## 2021-06-29 DIAGNOSIS — A419 Sepsis, unspecified organism: Secondary | ICD-10-CM

## 2021-06-29 DIAGNOSIS — Z66 Do not resuscitate: Secondary | ICD-10-CM | POA: Diagnosis not present

## 2021-06-29 DIAGNOSIS — A4151 Sepsis due to Escherichia coli [E. coli]: Secondary | ICD-10-CM | POA: Diagnosis not present

## 2021-06-29 DIAGNOSIS — G9340 Encephalopathy, unspecified: Secondary | ICD-10-CM

## 2021-06-29 DIAGNOSIS — R6521 Severe sepsis with septic shock: Secondary | ICD-10-CM | POA: Diagnosis not present

## 2021-06-29 DIAGNOSIS — Z7189 Other specified counseling: Secondary | ICD-10-CM | POA: Diagnosis not present

## 2021-06-29 DIAGNOSIS — Z515 Encounter for palliative care: Secondary | ICD-10-CM

## 2021-06-29 DIAGNOSIS — I959 Hypotension, unspecified: Secondary | ICD-10-CM | POA: Diagnosis not present

## 2021-06-29 LAB — BLOOD GAS, VENOUS
Acid-base deficit: 10.4 mmol/L — ABNORMAL HIGH (ref 0.0–2.0)
Bicarbonate: 17.1 mmol/L — ABNORMAL LOW (ref 20.0–28.0)
FIO2: 28
O2 Saturation: 44.5 %
Patient temperature: 36.4
pCO2, Ven: 51.2 mmHg (ref 44.0–60.0)
pH, Ven: 7.145 — CL (ref 7.250–7.430)
pO2, Ven: <31 mmHg — CL (ref 32.0–45.0)

## 2021-06-29 LAB — LACTIC ACID, PLASMA
Lactic Acid, Venous: 3.6 mmol/L (ref 0.5–1.9)
Lactic Acid, Venous: 1.4 mmol/L (ref 0.5–1.9)

## 2021-06-29 LAB — CBC WITH DIFFERENTIAL/PLATELET
Abs Immature Granulocytes: 2.02 10*3/uL — ABNORMAL HIGH (ref 0.00–0.07)
Basophils Absolute: 0 10*3/uL (ref 0.0–0.1)
Basophils Relative: 0 %
Eosinophils Absolute: 0 10*3/uL (ref 0.0–0.5)
Eosinophils Relative: 0 %
HCT: 24.7 % — ABNORMAL LOW (ref 36.0–46.0)
Hemoglobin: 8.5 g/dL — ABNORMAL LOW (ref 12.0–15.0)
Immature Granulocytes: 12 %
Lymphocytes Relative: 2 %
Lymphs Abs: 0.3 10*3/uL — ABNORMAL LOW (ref 0.7–4.0)
MCH: 31.7 pg (ref 26.0–34.0)
MCHC: 34.4 g/dL (ref 30.0–36.0)
MCV: 92.2 fL (ref 80.0–100.0)
Monocytes Absolute: 0.1 10*3/uL (ref 0.1–1.0)
Monocytes Relative: 0 %
Neutro Abs: 14.5 10*3/uL — ABNORMAL HIGH (ref 1.7–7.7)
Neutrophils Relative %: 86 %
Platelets: 84 10*3/uL — ABNORMAL LOW (ref 150–400)
RBC: 2.68 MIL/uL — ABNORMAL LOW (ref 3.87–5.11)
RDW: 16.9 % — ABNORMAL HIGH (ref 11.5–15.5)
WBC: 16.9 10*3/uL — ABNORMAL HIGH (ref 4.0–10.5)
nRBC: 0 % (ref 0.0–0.2)

## 2021-06-29 LAB — D-DIMER, QUANTITATIVE: D-Dimer, Quant: 14.24 ug/mL-FEU — ABNORMAL HIGH (ref 0.00–0.50)

## 2021-06-29 LAB — COMPREHENSIVE METABOLIC PANEL
ALT: 66 U/L — ABNORMAL HIGH (ref 0–44)
AST: 147 U/L — ABNORMAL HIGH (ref 15–41)
Albumin: 1.7 g/dL — ABNORMAL LOW (ref 3.5–5.0)
Alkaline Phosphatase: 325 U/L — ABNORMAL HIGH (ref 38–126)
Anion gap: 9 (ref 5–15)
BUN: 32 mg/dL — ABNORMAL HIGH (ref 8–23)
CO2: 16 mmol/L — ABNORMAL LOW (ref 22–32)
Calcium: 7.4 mg/dL — ABNORMAL LOW (ref 8.9–10.3)
Chloride: 113 mmol/L — ABNORMAL HIGH (ref 98–111)
Creatinine, Ser: 2.55 mg/dL — ABNORMAL HIGH (ref 0.44–1.00)
GFR, Estimated: 17 mL/min — ABNORMAL LOW (ref >60)
Glucose, Bld: 176 mg/dL — ABNORMAL HIGH (ref 70–99)
Potassium: 4.3 mmol/L (ref 3.5–5.1)
Sodium: 138 mmol/L (ref 135–145)
Total Bilirubin: 2.2 mg/dL — ABNORMAL HIGH (ref 0.3–1.2)
Total Protein: 3.5 g/dL — ABNORMAL LOW (ref 6.5–8.1)

## 2021-06-29 LAB — GLUCOSE, CAPILLARY
Glucose-Capillary: 150 mg/dL — ABNORMAL HIGH (ref 70–99)
Glucose-Capillary: 98 mg/dL (ref 70–99)
Glucose-Capillary: 104 mg/dL — ABNORMAL HIGH (ref 70–99)
Glucose-Capillary: 105 mg/dL — ABNORMAL HIGH (ref 70–99)
Glucose-Capillary: 103 mg/dL — ABNORMAL HIGH (ref 70–99)

## 2021-06-29 LAB — BASIC METABOLIC PANEL
Anion gap: 6 (ref 5–15)
BUN: 34 mg/dL — ABNORMAL HIGH (ref 8–23)
CO2: 17 mmol/L — ABNORMAL LOW (ref 22–32)
Calcium: 7.4 mg/dL — ABNORMAL LOW (ref 8.9–10.3)
Chloride: 115 mmol/L — ABNORMAL HIGH (ref 98–111)
Creatinine, Ser: 2.65 mg/dL — ABNORMAL HIGH (ref 0.44–1.00)
GFR, Estimated: 16 mL/min — ABNORMAL LOW (ref >60)
Glucose, Bld: 108 mg/dL — ABNORMAL HIGH (ref 70–99)
Potassium: 3.8 mmol/L (ref 3.5–5.1)
Sodium: 138 mmol/L (ref 135–145)

## 2021-06-29 LAB — C-REACTIVE PROTEIN: CRP: 11.9 mg/dL — ABNORMAL HIGH (ref <1.0)

## 2021-06-29 LAB — PHOSPHORUS: Phosphorus: 4.1 mg/dL (ref 2.5–4.6)

## 2021-06-29 LAB — FERRITIN: Ferritin: 1629 ng/mL — ABNORMAL HIGH (ref 11–307)

## 2021-06-29 LAB — MAGNESIUM: Magnesium: 2.1 mg/dL (ref 1.7–2.4)

## 2021-06-29 MED ORDER — VASOPRESSIN 20 UNITS/100 ML INFUSION FOR SHOCK
0.0000 [IU]/min | INTRAVENOUS | Status: DC
  Administered 2021-06-29 – 2021-07-01 (×6): 0.03 [IU]/min via INTRAVENOUS
  Filled 2021-06-29 (×6): qty 100

## 2021-06-29 MED ORDER — HALOPERIDOL LACTATE 5 MG/ML IJ SOLN
2.0000 mg | Freq: Four times a day (QID) | INTRAMUSCULAR | Status: DC | PRN
  Administered 2021-06-30: 2 mg via INTRAMUSCULAR
  Filled 2021-06-29: qty 1

## 2021-06-29 MED ORDER — SODIUM CHLORIDE 0.9 % IV BOLUS
500.0000 mL | Freq: Once | INTRAVENOUS | Status: AC
  Administered 2021-06-29: 500 mL via INTRAVENOUS

## 2021-06-29 MED ORDER — FENTANYL CITRATE (PF) 100 MCG/2ML IJ SOLN
25.0000 ug | INTRAMUSCULAR | Status: DC | PRN
  Administered 2021-06-29 – 2021-06-30 (×8): 25 ug via INTRAVENOUS
  Filled 2021-06-29 (×8): qty 2

## 2021-06-29 MED ORDER — SODIUM CHLORIDE 0.9% FLUSH
5.0000 mL | Freq: Two times a day (BID) | INTRAVENOUS | Status: DC
  Administered 2021-06-29 – 2021-06-30 (×3): 5 mL

## 2021-06-29 MED ORDER — DEXTROSE 50 % IV SOLN
INTRAVENOUS | Status: AC
  Filled 2021-06-29: qty 50

## 2021-06-29 MED ORDER — ARTIFICIAL TEARS OPHTHALMIC OINT: TOPICAL_OINTMENT | OPHTHALMIC | Status: DC | PRN

## 2021-06-29 MED ORDER — AMIODARONE IV BOLUS ONLY 150 MG/100ML
150.0000 mg | Freq: Once | INTRAVENOUS | Status: AC
  Administered 2021-06-29: 150 mg via INTRAVENOUS
  Filled 2021-06-29: qty 100

## 2021-06-29 MED ORDER — NOREPINEPHRINE 16 MG/250ML-% IV SOLN
0.0000 ug/min | INTRAVENOUS | Status: DC
Start: 2021-06-29 — End: 2021-07-02
  Administered 2021-06-29 (×2): 22 ug/min via INTRAVENOUS
  Administered 2021-06-30: 16 ug/min via INTRAVENOUS
  Administered 2021-06-30: 24 ug/min via INTRAVENOUS
  Administered 2021-07-01: 37 ug/min via INTRAVENOUS
  Filled 2021-06-29 (×5): qty 250

## 2021-06-29 MED ORDER — LEVOTHYROXINE SODIUM 100 MCG/5ML IV SOLN
25.0000 ug | Freq: Every day | INTRAVENOUS | Status: DC
  Administered 2021-06-29: 25 ug via INTRAVENOUS
  Filled 2021-06-29 (×3): qty 5

## 2021-06-29 NOTE — Procedures (Signed)
Central Venous Catheter Insertion Procedure Note  Monique Lin  [id number]  1927/04/04  Date:06/29/21  Time:6:24 AM   Provider Performing:Alek Poncedeleon   Procedure: Insertion of Non-tunneled Central Venous [phone]) with US guidance ([id number])   Indication(s) Medication administration  Consent Risks of the procedure as well as the alternatives and risks of each were explained to the patient and/or caregiver.  Consent for the procedure was obtained and is signed in the bedside chart  Anesthesia Topical only with 1% lidocaine   Timeout Verified patient identification, verified procedure, site/side was marked, verified correct patient position, special equipment/implants available, medications/allergies/relevant history reviewed, required imaging and test results available.  Sterile Technique Maximal sterile technique including full sterile barrier drape, hand hygiene, sterile gown, sterile gloves, mask, hair covering, sterile ultrasound probe cover (if used).  Procedure Description Area of catheter insertion was cleaned with chlorhexidine and draped in sterile fashion.  With real-time ultrasound guidance a central venous catheter was placed into the left internal jugular vein. Nonpulsatile blood flow and easy flushing noted in all ports.  The catheter was sutured in place and sterile dressing applied.  Complications/Tolerance None; patient tolerated the procedure well. Chest X-ray is ordered to verify placement for internal jugular or subclavian cannulation.   Chest x-ray is not ordered for femoral cannulation.  EBL Minimal  Specimen(s) None

## 2021-06-29 NOTE — Progress Notes (Signed)
NAME:  Monique Lin, MRN:  [id number], DOB:  1927/07/06, LOS: 2 ADMISSION DATE:  06/08/2021, CONSULTATION DATE:  7/25 REFERRING MD:  Dr. Lorin Mercy, CHIEF COMPLAINT:  Hypotension, COVID   History of Present Illness:  87 year old female with PMH as below, which is significant for gastrointestinal stromal tumor followed by Dr. Marin Olp on treatement with Gleevec, HTN, CKD, and hypothyroidism. She presented to [facility] ED 7/25 with complaints of malaise, body aches, and shortness of breath. Tested positive for COVID at home on the day of presentation. She is status post the initial series of vaccination in 2021. Upon arrival to the emergency department she was noted to be hypoxemic to the mid 80s on room air. She was placed on 2L nasal cannula and sats improved to the high 90s. COVID testing in the ED was confirmed positive. She was admitted to the hospitalists service. COVID labs were ordered including D. Dimer, which was positive. VQ scan was obtained with low probability of PE. As the day progressed she became hypotensive despite fluid bolus. PCCM consulted.   Pertinent  Medical History   has a past medical history of Anemia, Gastrointestinal stromal tumor (GIST) associated with mutation in KIT gene ([place]) (03/10/2020), Hypertension, and Hypothyroidism.   Significant Hospital Events: Including procedures, antibiotic start and stop dates in addition to other pertinent events   7/25 admit for COVID, hypotension 7/26 PCT with choledocholithiasis, unable to place PBD,  Cholecystostomy tube placed, LIJ placed  Interim History / Subjective:  Sleepy this morning. Difficult to arouse, withdraws from pain.   Underwent IR PTC on 7/26 unable to place PBD now with cholecystostomy. Hypotensive with MAPs in 40 despite 1.5 L fluid bolus, Left IJ placed.  Objective   Blood pressure (!) 99/46, pulse 98, temperature 97.9 F (36.6 C), temperature source Axillary, resp. rate 13, weight 85.9 kg, SpO2 99 %. CVP:  [8  mmHg-14 mmHg] 14 mmHg      Intake/Output Summary (Last 24 hours) at 06/29/2021 0724 Last data filed at 06/29/2021 4827 Gross per 24 hour  Intake 2589.07 ml  Output 40 ml  Net 2549.07 ml    Filed Weights   07/03/2021 1200 06/21/2021 2029  Weight: 83 kg 85.9 kg    Examination: General: Obese elderly female, lethargic,  HENT: [place]/AT, PERRL, no JVD Lungs: Clear bilateral breath sounds, wheezing or crackles, no accessory muscle use Cardiovascular: irregularly irregular, no MRG Abdomen: Soft, active bowel sounds, bilious drainage from cholecystostomy  Extremities: No acute deformity. 2+ edema  Neuro: Somnolent, confused, does not open eyes, moans to pain, minimal withdrawal from pain  Resolved Hospital Problem list     Assessment & Plan:  Septic shock secondary to Klebsiella pneumoniae likely due to cholangitis  BCID with Enterobacterales, E. Coli ,and klebsiella. Cultures positive for ampicillin resistant klebsiella pneumoniae  - GI and IR consulted - s/p PTC with cholecystostomy, PBD placement failed, Possible second attempt in 1-2 days per IR  - Levophed as needed to maintain MAP >65 - Start vasopressin - Continue ceftriaxone and metronidazole - Continue midodrine - Repeat  blood cultures  - Lactic acid - Monitor LFTs  Acute hypoxemic respiratory failure COVID pneumonia Stable on 2 L [place] - Supplemental O2 to keep SpO2 > 92% - Hydrocortisone 50 q6  - Trend LDH, Ferritin, CRP - Remdesivir for 3 days given history of GIST   Atrial fibrillation with RVR New onset overnight on 7/27. Now in sinus rhythm after dose of amiodarone. HAS-Bled elevated for age, hypertension,  renal, and liver disease. CHA2DS2VASc of 5 for age, sex, CHF, and hypertension. - Continue to monitor  AKI on CKD sCr 2.19 this morning with BUN of 25. Likely ATN given hypotension. Oliguric, concern for retention. - Place foley - monitor I/Os - monitor BMP, avoid nephrotoxins  Anemia: hemoglobin above  baseline on admission - Epoetin alfa weekly, last dose 06/28/2021 - Trend hemoglobin  Gastrointestinal stromal tumor: followed by Dr. Marin Olp.  - On Gleevec, tumor stable by recent imaging.   Hypothyroid - Synthroid  Goals of care Worsening mental status, liver and and renal function, now with new onset atrial fibrillation.  -palliative care consult -Family considering transitioning to comfort care but unable to make a decision   Best Practice (right click and "Reselect all SmartList Selections" daily)  Diet/type: NPO DVT prophylaxis: LMWH GI prophylaxis: PPI Lines: N/A Foley:  N/A Code Status:  full code Last date of multidisciplinary goals of care discussion [7/27 with palliative care and daughters. Family understands severity of patient's illness are currently considering transition to comfort care, will confer with other family members before coming to a decision. ]  Labs   CBC: Recent Labs  Lab 06/10/2021 0334 06/28/21 0410 06/29/21 0259  WBC 3.7* 18.5* 16.9*  NEUTROABS  --  17.2* 14.5*  HGB 9.8* 9.3* 8.5*  HCT 29.0* 27.2* 24.7*  MCV 96.3 94.1 92.2  PLT PLATELET CLUMPS NOTED ON SMEAR, UNABLE TO ESTIMATE 81* 84*     Basic Metabolic Panel: Recent Labs  Lab 06/23/2021 0334 06/28/21 0410 06/29/21 0259  NA 135 141 138  K 3.9 4.1 4.3  CL 110 114* 113*  CO2 20* 20* 16*  GLUCOSE 99 90 176*  BUN 23 25* 32*  CREATININE 1.98* 2.19* 2.55*  CALCIUM 8.2* 8.1* 7.4*  MG  --  1.4* 2.1  PHOS  --  5.0* 4.1    GFR: Estimated Creatinine Clearance: 14.6 mL/min (A) (by C-G formula based on SCr of 2.55 mg/dL (H)). Recent Labs  Lab 06/24/2021 0334 06/22/2021 0902 06/28/21 0410 06/29/21 0259  PROCALCITON  --  4.54  --   --   WBC 3.7*  --  18.5* 16.9*  LATICACIDVEN  --  1.6  --   --      Liver Function Tests: Recent Labs  Lab 06/28/21 0410 06/29/21 0259  AST 221* 147*  ALT 109* 66*  ALKPHOS 354* 325*  BILITOT 1.9* 2.2*  PROT 4.5* 3.5*  ALBUMIN 2.1* 1.7*    Recent  Labs  Lab 06/28/21 0410  LIPASE 26   No results for input(s): AMMONIA in the last 168 hours.  ABG    Component Value Date/Time   PHART 7.472 (H) 04/02/2018 0158   PCO2ART 23.8 (L) 04/02/2018 0158   PO2ART 191.0 (H) 04/02/2018 0158   HCO3 17.5 (L) 04/02/2018 0158   TCO2 18 (L) 04/02/2018 0158   ACIDBASEDEF 5.0 (H) 04/02/2018 0158   O2SAT 100.0 04/02/2018 0158      Coagulation Profile: Recent Labs  Lab 06/28/21 1525  INR 1.8*    Cardiac Enzymes: Recent Labs  Lab 07/02/2021 2114  CKTOTAL 341*     HbA1C: No results found for: HGBA1C  CBG: Recent Labs  Lab 06/28/21 0318 06/28/21 0733 06/28/21 2037 06/28/21 2307 06/29/21 0333  GLUCAP 99 84 94 86 150*      Past Medical History:  She,  has a past medical history of Anemia, Gastrointestinal stromal tumor (GIST) associated with mutation in KIT gene ([place]) (03/10/2020), Hypertension, and Hypothyroidism.   Surgical  History:   Past Surgical History:  Procedure Laterality Date   BIOPSY  01/12/2020   Procedure: BIOPSY;  Surgeon: Ronald Lobo, MD;  Location: [place];  Service: Endoscopy;;   ESOPHAGOGASTRODUODENOSCOPY (EGD) WITH PROPOFOL N/A 01/12/2020   Procedure: ESOPHAGOGASTRODUODENOSCOPY (EGD) WITH PROPOFOL;  Surgeon: Ronald Lobo, MD;  Location: [place];  Service: Endoscopy;  Laterality: N/A;   ESOPHAGOGASTRODUODENOSCOPY (EGD) WITH PROPOFOL N/A 01/15/2020   Procedure: ESOPHAGOGASTRODUODENOSCOPY (EGD) WITH PROPOFOL;  Surgeon: Arta Silence, MD;  Location: [place];  Service: Endoscopy;  Laterality: N/A;   EUS N/A 01/15/2020   Procedure: UPPER ENDOSCOPIC ULTRASOUND (EUS) RADIAL;  Surgeon: Arta Silence, MD;  Location: [place];  Service: Endoscopy;  Laterality: N/A;   FINE NEEDLE ASPIRATION  01/15/2020   Procedure: FINE NEEDLE ASPIRATION (FNA) LINEAR;  Surgeon: Arta Silence, MD;  Location: [place];  Service: Endoscopy;;   IR CATHETER TUBE CHANGE  04/14/2018   IR PERCUTANEOUS TRANSHEPATIC  CHOLANGIOGRAM  06/28/2021   IR THORACENTESIS ASP PLEURAL SPACE W/IMG GUIDE  04/05/2018   NASAL SINUS SURGERY     stomach tumor removal       Social History:   reports that she has never smoked. She has never used smokeless tobacco. She reports that she does not drink alcohol and does not use drugs.   Family History:  Her Family history is unknown by patient.   Allergies Allergies  Allergen Reactions   Codeine Other (See Comments)    Stomach cramps   Penicillins Other (See Comments)    "Bad stomach cramps" Has patient had a PCN reaction causing immediate rash, facial/tongue/throat swelling, SOB or lightheadedness with hypotension: Unk Has patient had a PCN reaction causing severe rash involving mucus membranes or skin necrosis: Unk Has patient had a PCN reaction that required hospitalization: Unk Has patient had a PCN reaction occurring within the last 10 years: No If all of the above answers are "NO", then may proceed with Cephalosporin use.       Home Medications  Prior to Admission medications   Medication Sig Start Date End Date Taking? Authorizing Provider  acetaminophen (TYLENOL) 650 MG CR tablet Take 650 mg by mouth every 8 (eight) hours as needed for pain.   Yes [provider]  albuterol (PROVENTIL) (2.5 MG/3ML) 0.083% nebulizer solution Take 2.5 mg by nebulization in the morning, at noon, in the evening, and at bedtime.   Yes [provider]  albuterol (VENTOLIN HFA) 108 (90 Base) MCG/ACT inhaler Inhale 1 puff into the lungs every 6 (six) hours as needed for wheezing or shortness of breath.   Yes [provider]  ALLEGRA ALLERGY 180 MG tablet Take 180 mg by mouth in the morning.   Yes [provider]  alum & mag hydroxide-simeth (MAALOX/MYLANTA) 200-200-20 MG/5ML suspension Take 30 mLs by mouth every 6 (six) hours as needed for indigestion, heartburn or flatulence. 12/22/19  Yes [name], MD  artificial tears (LACRILUBE) OINT  ophthalmic ointment Place into the left eye at bedtime. 02/01/21  Yes Elodia Florence., MD  atorvastatin (LIPITOR) 20 MG tablet Take 20 mg by mouth daily.   Yes [provider]  bisacodyl (DULCOLAX) 5 MG EC tablet Take 1 tablet (5 mg total) by mouth daily as needed for moderate constipation. 12/22/19  Yes [name], MD  carvedilol (COREG) 6.25 MG tablet Take 1 tablet (6.25 mg total) by mouth 2 (two) times daily with a meal. 01/30/20  Yes Ennever, Rudell Cobb, MD  Cholecalciferol (VITAMIN D-3) 125 MCG (5000  UT) TABS Take 5,000 Units by mouth daily.    Yes [provider]  Cyanocobalamin (VITAMIN B-12 PO) Take 500 mcg by mouth daily.    Yes [provider]  folic acid (FOLVITE) 754 MCG tablet Take 800 mcg by mouth daily.    Yes [provider]  gabapentin (NEURONTIN) 300 MG capsule Take 1 capsule (300 mg total) by mouth 2 (two) times daily. 01/16/20  Yes Mercy Riding, MD  hydroxypropyl methylcellulose / hypromellose (ISOPTO TEARS / GONIOVISC) 2.5 % ophthalmic solution Place 1 drop into the left eye 3 (three) times daily. Patient taking differently: Place 1 drop into the left eye 3 (three) times daily. Per daughter, she also uses it in the right eye but not often. Just as needed. 02/01/21  Yes Elodia Florence., MD  imatinib (GLEEVEC) 400 MG tablet Take 1 tablet (400 mg total) by mouth daily. Take with meals and large glass of water.Caution:Chemotherapy 04/22/21  Yes Volanda Napoleon, MD  losartan (COZAAR) 25 MG tablet Take 25 mg by mouth daily.   Yes [provider]  ondansetron (ZOFRAN) 8 MG tablet Take 1 tablet (8 mg total) by mouth every 8 (eight) hours as needed for nausea or vomiting. 03/15/20  Yes Ennever, Rudell Cobb, MD  OVER THE COUNTER MEDICATION Apply 1 application topically See admin instructions. HempVana cream - as needed for knee and shoulder cream   Yes [provider]  pantoprazole (PROTONIX) 40 MG tablet TAKE 1 TABLET(40 MG) BY MOUTH  TWICE DAILY Patient taking differently: Take 40 mg by mouth in the morning and at bedtime. 09/22/20  Yes Ennever, Rudell Cobb, MD  SYNTHROID 50 MCG tablet Take 50 mcg by mouth daily before breakfast.   Yes [provider]  trolamine salicylate (ASPERCREME) 10 % cream Apply 1 application topically as needed for muscle pain.   Yes [provider]  vitamin E 180 MG (400 UNITS) capsule Take 400 Units by mouth daily.   Yes [provider]  zinc gluconate 50 MG tablet Take 50 mg by mouth daily.   Yes [provider]    Iona Beard, MD Internal medicine, PGY-2 06/29/21 7:24 AM Pager [phone]

## 2021-06-29 NOTE — Progress Notes (Signed)
[facility] Progress Note Patient Name: Monique Lin DOB: Mar 26, 1927 MRN: [id number]   Date of Service  06/29/2021  HPI/Events of Note  Hypotension - BP = 75/37 with MAP = 49. Patient is DNR, however, the family is apparently OK with vasopressors. Now on maximal dose of Norepinephrine IV infusion via PIV.  eICU Interventions  Plan: Bolus with 0.9 NaCl 500 mL IV over 30 minutes now.  Will requests that PCCM ground team place CVL.     Intervention Category Major Interventions: Hypotension - evaluation and management  Morse Brueggemann [place] 06/29/2021, 1:01 AM

## 2021-06-29 NOTE — Progress Notes (Signed)
[facility] Progress Note Patient Name: Monique Lin DOB: 05-Oct-1927 MRN: [id number]   Date of Service  06/29/2021  HPI/Events of Note  Hypotension - BP = 69/47 with MAP = 56 on Norepinephrine IV infusion via PIV at 10 mcg/min. Max dose via PIV. BP now = 80/58 with MAP = 66.   eICU Interventions  Plan: Bolus with 0.9 NaCl 500 mL IV over 30 minutes now.      Intervention Category Major Interventions: Hypotension - evaluation and management  Curtez Brallier [place] 06/29/2021, 12:01 AM

## 2021-06-29 NOTE — Progress Notes (Signed)
Referring Physician(s): Dr. Loanne Drilling, C.   Supervising Physician: Sandi Mariscal  Patient Status:  [facility] - In-pt  Chief Complaint:  S/p attempted and failed PBD placement, PTC placement with Dr. Laurence Ferrari on 7/27  Subjective:  Patient laying in bed, not in acute distress.  Still on pressor, RN says patient went in a-fib overnight, received amiodarone.   Allergies: Codeine and Penicillins  Medications: Prior to Admission medications   Medication Sig Start Date End Date Taking? Authorizing Provider  acetaminophen (TYLENOL) 650 MG CR tablet Take 650 mg by mouth every 8 (eight) hours as needed for pain.   Yes [provider]  albuterol (PROVENTIL) (2.5 MG/3ML) 0.083% nebulizer solution Take 2.5 mg by nebulization in the morning, at noon, in the evening, and at bedtime.   Yes [provider]  albuterol (VENTOLIN HFA) 108 (90 Base) MCG/ACT inhaler Inhale 1 puff into the lungs every 6 (six) hours as needed for wheezing or shortness of breath.   Yes [provider]  ALLEGRA ALLERGY 180 MG tablet Take 180 mg by mouth in the morning.   Yes [provider]  alum & mag hydroxide-simeth (MAALOX/MYLANTA) 200-200-20 MG/5ML suspension Take 30 mLs by mouth every 6 (six) hours as needed for indigestion, heartburn or flatulence. 12/22/19  Yes [name], MD  artificial tears (LACRILUBE) OINT ophthalmic ointment Place into the left eye at bedtime. 02/01/21  Yes Elodia Florence., MD  atorvastatin (LIPITOR) 20 MG tablet Take 20 mg by mouth daily.   Yes [provider]  bisacodyl (DULCOLAX) 5 MG EC tablet Take 1 tablet (5 mg total) by mouth daily as needed for moderate constipation. 12/22/19  Yes [name], MD  carvedilol (COREG) 6.25 MG tablet Take 1 tablet (6.25 mg total) by mouth 2 (two) times daily with a meal. 01/30/20  Yes Ennever, Rudell Cobb, MD  Cholecalciferol (VITAMIN D-3) 125 MCG (5000 UT) TABS Take 5,000 Units by mouth daily.    Yes [provider]  Cyanocobalamin (VITAMIN B-12 PO) Take 500 mcg by mouth daily.    Yes [provider]  folic acid (FOLVITE) 742 MCG tablet Take 800 mcg by mouth daily.    Yes [provider]  gabapentin (NEURONTIN) 300 MG capsule Take 1 capsule (300 mg total) by mouth 2 (two) times daily. 01/16/20  Yes Mercy Riding, MD  hydroxypropyl methylcellulose / hypromellose (ISOPTO TEARS / GONIOVISC) 2.5 % ophthalmic solution Place 1 drop into the left eye 3 (three) times daily. Patient taking differently: Place 1 drop into the left eye 3 (three) times daily. Per daughter, she also uses it in the right eye but not often. Just as needed. 02/01/21  Yes Elodia Florence., MD  imatinib (GLEEVEC) 400 MG tablet Take 1 tablet (400 mg total) by mouth daily. Take with meals and large glass of water.Caution:Chemotherapy 04/22/21  Yes Volanda Napoleon, MD  losartan (COZAAR) 25 MG tablet Take 25 mg by mouth daily.   Yes [provider]  ondansetron (ZOFRAN) 8 MG tablet Take 1 tablet (8 mg total) by mouth every 8 (eight) hours as needed for nausea or vomiting. 03/15/20  Yes Ennever, Rudell Cobb, MD  OVER THE COUNTER MEDICATION Apply 1 application topically See admin instructions. HempVana cream - as needed for knee and shoulder cream   Yes [provider]  pantoprazole (PROTONIX) 40 MG tablet TAKE 1 TABLET(40 MG) BY MOUTH TWICE DAILY Patient taking differently: Take 40 mg by mouth in the morning and  at bedtime. 09/22/20  Yes Ennever, Rudell Cobb, MD  SYNTHROID 50 MCG tablet Take 50 mcg by mouth daily before breakfast.   Yes [provider]  trolamine salicylate (ASPERCREME) 10 % cream Apply 1 application topically as needed for muscle pain.   Yes [provider]  vitamin E 180 MG (400 UNITS) capsule Take 400 Units by mouth daily.   Yes [provider]  zinc gluconate 50 MG tablet Take 50 mg by mouth daily.   Yes [provider]     Vital Signs: BP (!)  89/48   Pulse 95   Temp 97.6 F (36.4 C) (Axillary)   Resp 13   Wt 189 lb 6 oz (85.9 kg)   SpO2 99%   BMI 33.55 kg/m   Physical Exam Constitutional:      General: She is not in acute distress.    Appearance: She is ill-appearing.  HENT:     Head: Normocephalic and atraumatic.  Pulmonary:     Effort: Pulmonary effort is normal.  Abdominal:     General: Abdomen is flat.     Palpations: Abdomen is soft.  Skin:    General: Skin is warm and dry.     Coloration: Skin is not jaundiced or pale.     Comments: Positive RUQ drain to a gravity bag. Site is unremarkable with no erythema, edema, tenderness, bleeding or drainage. Suture and stat lock in place. Dressing is clean, dry, and intact. 20 ml of  bile colored fluid noted in the bag. Drain aspirates and flushes well.    Neurological:     Comments: Non responsive to verbal stimuli. Responsive to painful stimuli.    Imaging: CT ABDOMEN PELVIS WO CONTRAST  Result Date: 06/28/2021 CLINICAL DATA:  86 year old with history of GIST tumor with sepsis and cholangitis. Patient is being evaluated for biliary decompression. EXAM: CT ABDOMEN AND PELVIS WITHOUT CONTRAST TECHNIQUE: Multidetector CT imaging of the abdomen and pelvis was performed following the standard protocol without IV contrast. COMPARISON:  08/13/2020 and 06/08/2021 FINDINGS: Lower chest: Small bilateral pleural effusions. Volume loss in both lower lobes. Coronary artery calcifications. Hepatobiliary: There is a chronic low-density structure along the left hepatic dome most compatible with a cyst based on the previous contrast enhanced examination. Evaluation of the liver structures is limited on this exam without intravascular contrast. There are small gallstones. No significant gallbladder dilatation. There is probably mild intrahepatic biliary dilatation. Common bile duct is slightly prominent. Again noted is a large calcification in the distal common bile duct compatible with  choledocholithiasis. This common bile duct stone measures up to 9 mm. Pancreas: Limited evaluation because due to GIST tumor and calcifications in this region. Spleen: Normal in size without focal abnormality. Adrenals/Urinary Tract: No gross abnormality. Left renal cysts. The left renal upper pole cyst is slightly hyperdense and poorly characterized on this examination. No acute abnormality to the right kidney. Negative for hydronephrosis. Fluid in the urinary bladder. Stomach/Bowel: Again noted is a poorly defined structure with calcifications around the stomach and this is compatible the patient's known GIST tumor. There is no significant bowel dilatation. No evidence for acute bowel inflammation. Vascular/Lymphatic: Diffuse atherosclerotic calcifications throughout the aorta without aneurysm. No significant abdominopelvic lymphadenopathy but limited on this examination. Reproductive: Again noted are multiple uterine calcifications probably related to fibroids. No acute abnormality. Other: Mild mesenteric edema with extensive subcutaneous edema. There is periumbilical hernia with containing fat and there is some edema or fluid in this area. Negative for  free air. Musculoskeletal: No acute bone abnormality. IMPRESSION: 1. Persistent large calcified stone in the distal common bile duct compatible with choledocholithiasis. Evidence for at least mild intrahepatic and extrahepatic biliary dilatation which is poorly characterized on this examination. Cholelithiasis. 2. Small bilateral pleural effusions with compressive atelectasis in the lower lobes bilaterally. 3. Known GIST tumor in the upper abdomen centered near the stomach and pancreas. This area is poorly characterized on this non contrast examination. 4. Diffuse subcutaneous edema. 5. Periumbilical hernia containing fat and this area appears to be edematous. Electronically Signed   By: Markus Daft M.D.   On: 06/28/2021 17:37   DG Chest 1 View  Result Date:  06/25/2021 CLINICAL DATA:  Shortness of breath EXAM: CHEST  1 VIEW COMPARISON:  05/03/2020 radiography and 06/08/2021 chest CT FINDINGS: Overall large lung volumes with streaky density behind the heart and at the medial right base. No edema, effusion, or pneumothorax. Likely normal heart size given technique. IMPRESSION: Atelectasis at the bases, also seen on chest CT 06/08/2021. No interval abnormality. Electronically Signed   By: Monte Fantasia M.D.   On: 06/10/2021 04:27   NM Pulmonary Perfusion  Result Date: 06/19/2021 CLINICAL DATA:  Chest pain, shortness of breath, emphysema, history of blood clots EXAM: NUCLEAR MEDICINE PERFUSION LUNG SCAN TECHNIQUE: Perfusion images were obtained in multiple projections after intravenous injection of radiopharmaceutical. Ventilation scans intentionally deferred if perfusion scan and chest x-ray adequate for interpretation during COVID 19 epidemic. RADIOPHARMACEUTICALS:  4.4 mCi Tc-4mMAA IV COMPARISON:  Same day chest radiograph FINDINGS: Normal, homogeneous perfusion of the lungs bilaterally. No suspicious filling defects. Cardiomegaly. IMPRESSION: 1. Very low probability for pulmonary embolism by modified perfusion only PIOPED criteria (PE absent). 2.  Cardiomegaly. Electronically Signed   By: AEddie CandleM.D.   On: 06/13/2021 14:28   [place]domen Complete  Result Date: 06/28/2021 CLINICAL DATA:  Sepsis, eval common bile duct EXAM: ABDOMEN ULTRASOUND COMPLETE COMPARISON:  CT abdomen pelvis 06/08/2021 FINDINGS: Gallbladder: Nondilated gallbladder. There is intraluminal sludge and small stones, largest stone measuring 6 mm. Minimal wall thickening. Minimal pericholecystic fluid. Common bile duct: Diameter: 13 mm Liver: Low parenchymal echogenicity. There is intrahepatic biliary ductal dilation. Portal vein is patent on color Doppler imaging with normal direction of blood flow towards the liver. IVC: Intrahepatic portion of visualized. Pancreas: Not visualized.  Spleen: Not visualized. Right Kidney: Length: 10.3 cm.  No hydronephrosis. Left Kidney: Length: Not well visualized. Abdominal aorta: Not visualized due to bowel gas. Other findings: None. IMPRESSION: Intra- and extrahepatic biliary ductal dilation with common bile duct measuring up to 13 mm. Correlate with LFTs and recommend MRCP for further evaluation. Intraluminal sludge and small stones within a nondilated gallbladder. Low hepatic parenchyma echogenicity which can be seen in hepatitis. These results will be called to the ordering clinician or representative by the Radiologist Assistant, and communication documented in the PACS or [employer] Electronically Signed   By: JMaurine Simmering  On: 06/28/2021 12:45   DG CHEST PORT 1 VIEW  Result Date: 06/29/2021 CLINICAL DATA:  Central line placement. EXAM: PORTABLE CHEST 1 VIEW COMPARISON:  06/28/2021. FINDINGS: Patient is rotated to the left. Left IJ line noted with tip over the upper right atrium. Cardiomegaly. No pulmonary venous congestion. Progressive left base atelectasis and consolidation. Mild right base infiltrate cannot be excluded. Small bilateral pleural effusions, left side greater than right. No pneumothorax. IMPRESSION: 1. Left IJ line noted with tip over the upper right atrium. Cardiomegaly. No pulmonary venous congestion.  2. Progressive left base atelectasis and consolidation. Mild right base infiltrate cannot be excluded. Small bilateral pleural effusions, left side greater than right. Electronically Signed   By: Marcello Moores  Register   On: 06/29/2021 05:59   DG CHEST PORT 1 VIEW  Result Date: 06/28/2021 CLINICAL DATA:  Abnormal respiration EXAM: PORTABLE CHEST 1 VIEW COMPARISON:  06/24/2021, CT 06/08/2021 FINDINGS: Persistent streaky and hazy opacities in the lung bases more coalescent density in the retrocardiac space. No pneumothorax or discernible pleural effusion. No focal consolidative process. Pulmonary vascularity remains normally  distributed. Stable cardiomediastinal contours with a calcified aorta. The osseous structures appear diffusely demineralized which may limit detection of small or nondisplaced fractures. No acute osseous abnormality or suspicious osseous lesion. Degenerative changes are present in the imaged spine and shoulders. No worrisome soft tissue abnormality of the chest wall. Telemetry leads overlie the chest. IMPRESSION: Streaky and hazy opacities in the bases favoring atelectasis. More coalescent density in the left lung base, likely further volume loss or early airspace disease. Aortic Atherosclerosis (ICD10-I70.0). Electronically Signed   By: Lovena Le M.D.   On: 06/28/2021 03:09   IR PERCUTANEOUS TRANSHEPATIC CHOLANGIOGRAM  Result Date: 06/29/2021 INDICATION: 87 year old female with COVID and bacterial sepsis with a known chronic choledocholithiasis but concern for new cholangitis. She is currently too ill for ERCP and so interventional radiology is consulted for attempted percutaneous transhepatic cholangiogram with placement of a biliary drain. EXAM: 1. Percutaneous transhepatic cholangiogram 2. Percutaneous cholecystostomy tube placement MEDICATIONS: In patient currently receiving intravenous antibiotics ANESTHESIA/SEDATION: Moderate (conscious) sedation was employed during this procedure. A total of Versed 4.5 mg and Fentanyl 150 mcg was administered intravenously. Moderate Sedation Time: 130 minutes. The patient's level of consciousness and vital signs were monitored continuously by radiology nursing throughout the procedure under my direct supervision. FLUOROSCOPY TIME:  Fluoroscopy Time: 71 minutes 12 seconds (1694 mGy). COMPLICATIONS: None immediate. PROCEDURE: Informed written consent was obtained from the patient after a thorough discussion of the procedural risks, benefits and alternatives. All questions were addressed. Maximal Sterile Barrier Technique was utilized including caps, mask, sterile gowns,  sterile gloves, sterile drape, hand hygiene and skin antiseptic. A timeout was performed prior to the initiation of the procedure. The right upper quadrant was initially interrogated with ultrasound. The liver is small and relatively high in position. Additionally, colon obscures nearly the entirety of the left hepatic lobe. A suitable inter costal entry site in the right anterior axillary line was selected. Local anesthesia was attained by infiltration with 1% lidocaine. A 21 gauge needle was then passed into the liver and contrast gently injected while the needle was retracted. Ultimately, there was successful opacification of a central bile ducts. Contrast was injected to perform a percutaneous transhepatic cholangiogram. Unfortunately, due to deep breathing with use of accessory muscles and significant excursion of the chest, the needle was displaced from the bile duct resulting in irregular contrast deposition in the hepatic parenchyma partially obscuring the central ducts. Therefore, additional ultrasound was performed. There is a small window into the central aspect of the segment 4 ducts. Local anesthesia was again obtained with 1% lidocaine. Under real-time ultrasound guidance, a 22 gauge Chiba needle was carefully advanced into 1 of the more central segment 4 bile ducts. This allowed for a more thorough percutaneous transhepatic cholangiogram. There is marked dilation of the common bile duct with a large filling defect in the distal common bile duct consistent with the known choledocholithiasis. Due to the large size of the common bile duct,  a very large volume of contrast had to be injected before the peripheral ducts could be opacified. There is fairly significant central intrahepatic biliary ductal dilatation, but very minimal dilation of the more peripheral bile ducts. A suitable bile duct in the posterior right lower lobe was identified for percutaneous access. Using a third 22 gauge needle, extensive  attempts were made to puncture this duct via traditional techniques. However, despite visibly puncturing the duct on numerous occasions, a wire could not be advanced successfully into the biliary tree. This was due in part to extreme challenges with patient breathing, sedation, agitation and movement. Ultimately, after over 70 minutes of fluoro time further attempts at obtaining percutaneous biliary access were abandoned. In an effort to provide some drainage of the infected bile the decision was made to proceed with placement of a percutaneous transhepatic cholecystostomy tube. The ultrasound was re-prepped and a suitable skin entry site selected for access of the gallbladder lumen. Local anesthesia was again attained by infiltration with 1% lidocaine. Under real-time ultrasound guidance, a 22 gauge Chiba needle was advanced into the gallbladder lumen. The 0.018 wire was advanced in the gallbladder lumen. A small dermatotomy was made at the needle entry site. The needle was removed and exchanged for the Accustick transitional dilator. The Accustick transitional dilator was advanced to the gallbladder lumen. The 018 wire was then exchanged for a 0.035 wire. The soft tissue tract was dilated to 10 [place]. A Cook 10.2 [place] all-purpose drain was then advanced over the wire and formed in the gallbladder lumen. Aspiration yields a copious volume of bile. Aspiration of the gallbladder lumen does result in some decompression of the biliary tree. The gallbladder drain was gently flushed with saline and connected to gravity bag drainage before being secured to the skin with 0 Prolene suture. IMPRESSION: 1. Successful percutaneous transhepatic cholangiogram demonstrating choledocholithiasis and marked dilation of the common bile duct, moderate dilation of the central intrahepatic ducts and minimal dilation of the peripheral bile ducts. 2. Despite over 70 minutes of fluoroscopy time, ultimately unsuccessful attempted  placement of a percutaneous biliary drain due to challenges with ability to sedate patient, patient agitation, significant respiratory motion and patient movement. 3. Ultimately, a percutaneous transhepatic cholecystostomy tube was placed in an effort to decompress the biliary system. PLAN: Patient would benefit most from ERCP which would provide complete biliary drainage as well as treat the underlying problem. If percutaneous drainage of the gallbladder is sufficient to allow some clinical improvement for the patient in order to allow ERCP to be performed, this would be the best next step. However, if patient fails to improve clinically, a repeat attempt at percutaneous biliary drain placement could be performed. This would require assistance of anesthesiology with either propofol deep sedation, or general anesthesia to allow for successful drain placement in this very challenging patient. Electronically Signed   By: Jacqulynn Cadet M.D.   On: 06/29/2021 07:15   ECHOCARDIOGRAM LIMITED  Result Date: 06/28/2021    ECHOCARDIOGRAM LIMITED REPORT   Patient Name:   Monique Lin Date of Exam: 06/28/2021 Medical Rec #:  [id number]    Height:       63.0 in Accession #:    [id number]   Weight:       189.4 lb Date of Birth:  November 29, 1927     BSA:          1.889 m Patient Age:    28 years     BP:  104/44 mmHg Patient Gender: F            HR:           80 bpm. Exam Location:  Inpatient Procedure: Limited Echo, Cardiac Doppler and Color Doppler Indications:    Other abnormalities of the heart R00.8  History:        Patient has prior history of Echocardiogram examinations, most                 recent 01/13/2020. Risk Factors:Hypertension and Non-Smoker.  Sonographer:    Vickie Epley RDCS Referring Phys: [id number] [place] MARSHALL  Sonographer Comments: Covid positive. IMPRESSIONS  1. The left ventricle has normal function. The left ventricle has no regional wall motion abnormalities. There is mild concentric left  ventricular hypertrophy. Left ventricular diastolic parameters are consistent with Grade II diastolic dysfunction (pseudonormalization).  2. The right ventricular size is normal. There is mildly elevated pulmonary artery systolic pressure.  3. The mitral valve is normal in structure. Trivial mitral valve regurgitation.  4. The aortic valve is tricuspid. Aortic valve regurgitation is not visualized. Mild aortic valve sclerosis is present, with no evidence of aortic valve stenosis.  5. The inferior vena cava is normal in size with greater than 50% respiratory variability, suggesting right atrial pressure of 3 mmHg. FINDINGS  Left Ventricle: The left ventricle has normal function. The left ventricle has no regional wall motion abnormalities. The left ventricular internal cavity size was normal in size. There is mild concentric left ventricular hypertrophy. Left ventricular diastolic parameters are consistent with Grade II diastolic dysfunction (pseudonormalization). Right Ventricle: The right ventricular size is normal. No increase in right ventricular wall thickness. There is mildly elevated pulmonary artery systolic pressure. The tricuspid regurgitant velocity is 3.24 m/s, and with an assumed right atrial pressure  of 3 mmHg, the estimated right ventricular systolic pressure is 40.9 mmHg. Left Atrium: Left atrial size was normal in size. Right Atrium: Right atrial size was normal in size. Pericardium: There is no evidence of pericardial effusion. Mitral Valve: The mitral valve is normal in structure. Trivial mitral valve regurgitation. Tricuspid Valve: The tricuspid valve is normal in structure. Tricuspid valve regurgitation is mild. Aortic Valve: The aortic valve is tricuspid. Aortic valve regurgitation is not visualized. Mild aortic valve sclerosis is present, with no evidence of aortic valve stenosis. Pulmonic Valve: The pulmonic valve was normal in structure. Pulmonic valve regurgitation is not visualized. Aorta:  The aortic root is normal in size and structure. Venous: The inferior vena cava is normal in size with greater than 50% respiratory variability, suggesting right atrial pressure of 3 mmHg. LEFT VENTRICLE PLAX 2D LVIDd:         4.40 cm     Diastology LVIDs:         2.80 cm     LV e' medial:    7.18 cm/s LV PW:         0.70 cm     LV E/e' medial:  16.0 LV IVS:        0.70 cm     LV e' lateral:   8.27 cm/s LVOT diam:     1.70 cm     LV E/e' lateral: 13.9 LV SV:         53 LV SV Index:   28 LVOT Area:     2.27 cm  LV Volumes (MOD) LV vol d, MOD A2C: 77.8 ml LV vol d, MOD A4C: 70.0 ml LV vol s, MOD A2C: 32.5  ml LV vol s, MOD A4C: 34.5 ml LV SV MOD A2C:     45.3 ml LV SV MOD A4C:     70.0 ml LV SV MOD BP:      41.2 ml RIGHT VENTRICLE RV S prime:     12.70 cm/s TAPSE (M-mode): 2.2 cm LEFT ATRIUM         Index LA diam:    3.60 cm 1.91 cm/m  AORTIC VALVE LVOT Vmax:   106.00 cm/s LVOT Vmean:  75.400 cm/s LVOT VTI:    0.232 m  AORTA Ao Root diam: 3.10 cm MITRAL VALVE                TRICUSPID VALVE MV Area (PHT): 3.79 cm     TR Peak grad:   42.0 mmHg MV Decel Time: 200 msec     TR Vmax:        324.00 cm/s MV E velocity: 115.00 cm/s MV A velocity: 88.10 cm/s   SHUNTS MV E/A ratio:  1.31         Systemic VTI:  0.23 m                             Systemic Diam: 1.70 cm [name] MD Electronically signed by [name] MD Signature Date/Time: 06/28/2021/3:26:32 PM    Final     Labs:  CBC: Recent Labs    06/08/21 1358 06/09/2021 0334 06/28/21 0410 06/29/21 0259  WBC 4.5 3.7* 18.5* 16.9*  HGB 8.1* 9.8* 9.3* 8.5*  HCT 23.7* 29.0* 27.2* 24.7*  PLT 150 PLATELET CLUMPS NOTED ON SMEAR, UNABLE TO ESTIMATE 81* 84*    COAGS: Recent Labs    06/28/21 1525  INR 1.8*    BMP: Recent Labs    07/14/20 1518 08/13/20 1513 09/29/20 1516 06/08/21 1358 06/19/2021 0334 06/28/21 0410 06/29/21 0259  NA 143 137   < > 140 135 141 138  K 4.8 4.8   < > 4.2 3.9 4.1 4.3  CL 110 102   < > 110 110 114* 113*  CO2 27 30   < >  20* 20* 20* 16*  GLUCOSE 81 83   < > 87 99 90 176*  BUN 28* 18   < > 18 23 25* 32*  CALCIUM 9.3 9.5   < > 9.0 8.2* 8.1* 7.4*  CREATININE 1.30* 1.30*   < > 1.82* 1.98* 2.19* 2.55*  GFRNONAA 36* 36*   < > 26* 23* 21* 17*  GFRAA 42* 42*  --   --   --   --   --    < > = values in this interval not displayed.    LIVER FUNCTION TESTS: Recent Labs    04/22/21 1439 06/08/21 1358 06/28/21 0410 06/29/21 0259  BILITOT 0.8 0.5 1.9* 2.2*  AST 17 22 221* 147*  ALT 7 10 109* 66*  ALKPHOS 62 57 354* 325*  PROT 6.0* 5.6* 4.5* 3.5*  ALBUMIN 3.5 3.3* 2.1* 1.7*    Assessment and Plan:  87 yo female with hx of chronic choledocholithiasis, currently admitted due to baterial sepsis and concern for new cholangitis. Incidental finding of COVID.  GI was consulted for further evaluation and management, who recommended IR consult for possible biliary drain placement due to patient too unstable for ERCP.   Patient is s/p attempted and failed PBD placement, PTC was placed instead in an effort to decompress the biliary system with Dr. Laurence Ferrari on  7/27.  Case was challenging due to ability to sedate patient, patient agitation,significant respiratory motion and patient movement.  PTC drain intact, flushes and aspirates well. Puncture site unremarkable, no s/s of bleeding or infection.  OP 40 cc  bilious  VSS WBC 16.9 today (18.5 yesterday) - WBC obtained at 0259 am, prior to IR intervention this morning  No cx obtained from bile   PLAN   Continue with flushing q shift, output recording q shift and dressing changes as needed. Consider return to IR for 2nd attempt at PBD placement in 1-2 days versus ERCP. The second attempt may require assistant from anesthesia for deep sedation with propofol or general anesthesia.   Further treatment plan per PCCM/ GI/  Appreciate and agree with the plan.  IR to follow.   Electronically Signed: Tera Mater, PA-C 06/29/2021, 10:14 AM   I spent a total of 25  Minutes at the the patient's bedside AND on the patient's hospital floor or unit, greater than 50% of which was counseling/coordinating care for PTC placement.

## 2021-06-29 NOTE — Progress Notes (Signed)
[facility] Progress Note Patient Name: Monique Lin DOB: 06-Mar-1927 MRN: [id number]   Date of Service  06/29/2021  HPI/Events of Note  Multiple issues: Request to review CXR for L IJ CVL placement. L IJ CVL tip in proximal R atrium. 2. Request to change Synthroid PO to IV.   eICU Interventions  Plan: OK to use L IJ CVL. Monitor CVP now and Q 4 hours.  D/C Synthroid 50 mcg PO Q day. Synthroid 25 mcg IV Q day.     Intervention Category Major Interventions: Other:  Annalynne Ibanez Cornelia Copa 06/29/2021, 4:53 AM

## 2021-06-29 NOTE — Progress Notes (Signed)
Daily Progress Note   Patient Name: Monique Lin       Date: 06/29/2021 DOB: 15-Jan-1927  Age: 87 y.o. MRN#: [id number] Attending Physician: Margaretha Seeds, MD Primary Care Physician: [name], MD Admit Date: 06/11/2021  Reason for Consultation/Follow-up: Establishing goals of care  Subjective: Spoke with patient's daughters- Monique Lin and Monique Lin.  Updated them of events over night- addition of pressors, increasing renal failure with poor urine output, hepatic insufficiency, patient lethargic today.  Expressed my concern to them that patient is dying. They agreed they were concerned about this as well.  Discussed with them that current efforts are maintaining her bodily function and keeping her body alive- but likely will not be able to restore her to her previous state of function.  Options for continuing current interventions vs transition to comfort and allowing natural dying process with symptom management were discussed.  Monique Lin and [place] plan to discuss these options with other family members and let care team know of their decision.   Review of Systems  Unable to perform ROS: Mental status change   Length of Stay: 2  Current Medications: Scheduled Meds:   artificial tears   Left Eye QHS   vitamin C  500 mg Oral Daily   atorvastatin  20 mg Oral Daily   Chlorhexidine Gluconate Cloth  6 each Topical Daily   dextrose       docusate sodium  100 mg Oral BID   enoxaparin (LOVENOX) injection  30 mg Subcutaneous Q24H   hydrocortisone sodium succinate  50 mg Intravenous Q6H   levothyroxine  25 mcg Intravenous Daily   loratadine  10 mg Oral Daily   mouth rinse  15 mL Mouth Rinse BID   midodrine  10 mg Oral TID WC   pantoprazole (PROTONIX) IV  40 mg Intravenous Q12H   polyvinyl  alcohol  1 drop Left Eye TID   sodium chloride flush  3 mL Intravenous Q12H   sodium chloride flush  3 mL Intravenous Q12H   sodium chloride flush  5 mL Intracatheter Q8H   zinc sulfate  220 mg Oral Daily    Continuous Infusions:  sodium chloride     sodium chloride Stopped (06/28/21 0100)   cefTRIAXone (ROCEPHIN)  IV Stopped (06/28/21 1354)   metronidazole 500 mg (06/28/21 2306)   norepinephrine (  LEVOPHED) Adult infusion 26 mcg/min (06/29/21 1000)   remdesivir 100 mg in NS 100 mL Stopped (06/28/21 1211)   vasopressin 0.03 Units/min (06/29/21 1000)    PRN Meds: sodium chloride, acetaminophen, albuterol, albuterol, artificial tears, bisacodyl, chlorpheniramine-HYDROcodone, fentaNYL (SUBLIMAZE) injection, guaiFENesin-dextromethorphan, ondansetron **OR** ondansetron (ZOFRAN) IV, polyethylene glycol, sodium chloride flush, sodium phosphate  Physical Exam Vitals and nursing note reviewed.  Constitutional:      Appearance: She is ill-appearing.  Cardiovascular:     Rate and Rhythm: Normal rate. Rhythm irregular.  Pulmonary:     Effort: Pulmonary effort is normal.  Skin:    Coloration: Skin is jaundiced and pale.  Neurological:     Comments: Lethargic, does not respond appropriately per nurse report            Vital Signs: BP 121/89   Pulse 80   Temp 97.6 F (36.4 C) (Axillary)   Resp 17   Wt 85.9 kg   SpO2 99%   BMI 33.55 kg/m  SpO2: SpO2: 99 % O2 Device: O2 Device: Nasal Cannula O2 Flow Rate: O2 Flow Rate (L/min): 2 L/min  Intake/output summary:  Intake/Output Summary (Last 24 hours) at 06/29/2021 1029 Last data filed at 06/29/2021 1000 Gross per 24 hour  Intake 2440.58 ml  Output 40 ml  Net 2400.58 ml   LBM: Last BM Date: 06/29/21 Baseline Weight: Weight: 83 kg Most recent weight: Weight: 85.9 kg       Palliative Assessment/Data: PPS: 10%      Patient Active Problem List   Diagnosis Date Noted   Acute hypoxemic respiratory failure due to COVID-19 (HCC)  06/25/2021   Stage 4 chronic kidney disease ([place]) 06/03/2021   Class 1 obesity due to excess calories with body mass index (BMI) of 32.0 to 32.9 in adult 06/30/2021   Acute hypoxemic respiratory failure (HCC) 06/21/2021   Hypertension 02/01/2021   Bell's palsy 02/01/2021   Facial droop    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 female  with past medical history of GIST- stable currently on Gleevec last scan on 06/08/21 without progression, anemia (recently transfused at cancer center) admitted on 06/10/2021 with COVID+, septic shock- requiring IV pressors- bacteremia growing E. Coli and Klebsiella. Workup has revealed possible GI source of infection with increased biliary ductal dilation, cholelithiasis and sludge. Her Cr is trending up. Palliative consulted for [place].   Assessment/Recommendations/Plan  Family considering options of comfort measures only- they will notify PMT or primary team when they decide They are outside of 5 day quarantine window for their Covid positivity- they will be allowed to visit  Goals of Care and Additional Recommendations: Limitations on Scope of Treatment:  to be determined  Code Status: DNR  Prognosis:  Unable to determine - concerned she may have days  Discharge Planning: To Be Determined  Care plan was discussed with family and care team  Thank you for allowing the Palliative  Medicine Team  to assist in the care of this patient.   Total time: 38 mins Greater than 50%  of this time was spent counseling and coordinating care related to the above assessment and plan.  Mariana Kaufman, AGNP-C Palliative Medicine   Please contact Palliative Medicine Team phone at [phone] for questions and concerns.

## 2021-06-29 NOTE — Progress Notes (Signed)
[facility] Progress Note Patient Name: Monique Lin DOB: 28-Apr-1927 MRN: [id number]   Date of Service  06/29/2021  HPI/Events of Note  Hypotension - BP = 99/39 with MAP = 48. Ground team aware of need for CVL.  eICU Interventions  Plan: Bolus with 0.9 NaCl 500 mL IV over 30 minutes now.      Intervention Category Major Interventions: Hypotension - evaluation and management  Avigail Pilling [place] 06/29/2021, 1:49 AM

## 2021-06-29 NOTE — Progress Notes (Signed)
Subjective: Confused overnight.  Objective: Vital signs in last 24 hours: Temp:  [97.3 F (36.3 C)-97.9 F (36.6 C)] 97.6 F (36.4 C) (07/27 0700) Pulse Rate:  [72-156] 80 (07/27 1000) Resp:  [7-26] 17 (07/27 1000) BP: (53-147)/(20-122) 121/89 (07/27 1000) SpO2:  [84 %-100 %] 99 % (07/27 1000) Weight change:  Last BM Date: 06/29/21  PE: GEN:  Confused, disoriented ABD:  Protuberant, generalized upper abdominal tenderness  Lab Results: CBC    Component Value Date/Time   WBC 16.9 (H) 06/29/2021 0259   RBC 2.68 (L) 06/29/2021 0259   HGB 8.5 (L) 06/29/2021 0259   HGB 8.1 (L) 06/08/2021 1358   HCT 24.7 (L) 06/29/2021 0259   PLT 84 (L) 06/29/2021 0259   PLT 150 06/08/2021 1358   MCV 92.2 06/29/2021 0259   MCH 31.7 06/29/2021 0259   MCHC 34.4 06/29/2021 0259   RDW 16.9 (H) 06/29/2021 0259   LYMPHSABS 0.3 (L) 06/29/2021 0259   MONOABS 0.1 06/29/2021 0259   EOSABS 0.0 06/29/2021 0259   BASOSABS 0.0 06/29/2021 0259  CMP     Component Value Date/Time   NA 138 06/29/2021 0259   K 4.3 06/29/2021 0259   CL 113 (H) 06/29/2021 0259   CO2 16 (L) 06/29/2021 0259   GLUCOSE 176 (H) 06/29/2021 0259   BUN 32 (H) 06/29/2021 0259   CREATININE 2.55 (H) 06/29/2021 0259   CREATININE 1.82 (H) 06/08/2021 1358   CALCIUM 7.4 (L) 06/29/2021 0259   PROT 3.5 (L) 06/29/2021 0259   ALBUMIN 1.7 (L) 06/29/2021 0259   AST 147 (H) 06/29/2021 0259   AST 22 06/08/2021 1358   ALT 66 (H) 06/29/2021 0259   ALT 10 06/08/2021 1358   ALKPHOS 325 (H) 06/29/2021 0259   BILITOT 2.2 (H) 06/29/2021 0259   BILITOT 0.5 06/08/2021 1358   GFRNONAA 17 (L) 06/29/2021 0259   GFRNONAA 26 (L) 06/08/2021 1358   GFRAA 42 (L) 08/13/2020 1513   Assessment:  Cholangitis with sepsis syndrome and polymicrobial bacteremia. PTC unsuccessful yesterday, gallbladder drain placed instead. COVID +. History GIST, on Gleevec. Altered mental status. Acute renal failure.  Plan:   I have discussed case with patient's  daughter Monique Lin [phone]).  I have discussed option of ERCP.  Given patient's clinical status (altered mental status, hypotension requiring pressors, new onset atrial fibrillation, especially in light of age 87 years) I have quoted over 90% mortality rate related to general anesthesia which would be required for ERCP. Daughter would prefer to maximize medical management (fluids, pressors, antibiotics) and not pursue ERCP at this time.  If patient shows some signs of improvement in her hemodynamics tomorrow, we might reconsider ERCP.  Family is aware that regardless of what treatment is pursued, patient's overall prognosis is guarded    Landry Dyke 06/29/2021, 10:39 AM   Cell [phone] If no answer or after 5 PM call [phone]

## 2021-06-29 NOTE — Progress Notes (Signed)
OT Cancellation Note  Patient Details Name: DHANI IMEL MRN: [id number] DOB: January 22, 1927   Cancelled Treatment:    Reason Eval/Treat Not Completed: Medical issues which prohibited therapy. Pt decline, AMS- calling out for mom. RN asked to defer at this time.   [place] 06/29/2021, 10:03 AM  Jesse Sans OTR/L Acute Rehabilitation Services Pager: [phone] Office: [phone]

## 2021-06-29 NOTE — Progress Notes (Signed)
[facility] Progress Note Patient Name: Monique Lin DOB: 08-Sep-1927 MRN: [id number]   Date of Service  06/29/2021  HPI/Events of Note  Patient c/o pain related to drain.   eICU Interventions  Plan: Fentanyl 25 mcg IV Q 2 hours PRN severe pain.     Intervention Category Major Interventions: Other:  Lysle Dingwall 06/29/2021, 3:48 AM

## 2021-06-30 ENCOUNTER — Inpatient Hospital Stay (HOSPITAL_COMMUNITY): Payer: Medicare Other

## 2021-06-30 DIAGNOSIS — A4151 Sepsis due to Escherichia coli [E. coli]: Secondary | ICD-10-CM | POA: Diagnosis not present

## 2021-06-30 DIAGNOSIS — Z515 Encounter for palliative care: Secondary | ICD-10-CM | POA: Diagnosis not present

## 2021-06-30 DIAGNOSIS — U071 COVID-19: Secondary | ICD-10-CM | POA: Diagnosis not present

## 2021-06-30 DIAGNOSIS — N179 Acute kidney failure, unspecified: Secondary | ICD-10-CM

## 2021-06-30 DIAGNOSIS — J9601 Acute respiratory failure with hypoxia: Secondary | ICD-10-CM | POA: Diagnosis not present

## 2021-06-30 DIAGNOSIS — A419 Sepsis, unspecified organism: Secondary | ICD-10-CM | POA: Diagnosis not present

## 2021-06-30 LAB — COMPREHENSIVE METABOLIC PANEL
ALT: 65 U/L — ABNORMAL HIGH (ref 0–44)
AST: 97 U/L — ABNORMAL HIGH (ref 15–41)
Albumin: 1.9 g/dL — ABNORMAL LOW (ref 3.5–5.0)
Alkaline Phosphatase: 264 U/L — ABNORMAL HIGH (ref 38–126)
Anion gap: 6 (ref 5–15)
BUN: 36 mg/dL — ABNORMAL HIGH (ref 8–23)
CO2: 18 mmol/L — ABNORMAL LOW (ref 22–32)
Calcium: 7.8 mg/dL — ABNORMAL LOW (ref 8.9–10.3)
Chloride: 114 mmol/L — ABNORMAL HIGH (ref 98–111)
Creatinine, Ser: 2.84 mg/dL — ABNORMAL HIGH (ref 0.44–1.00)
GFR, Estimated: 15 mL/min — ABNORMAL LOW (ref >60)
Glucose, Bld: 104 mg/dL — ABNORMAL HIGH (ref 70–99)
Potassium: 3.9 mmol/L (ref 3.5–5.1)
Sodium: 138 mmol/L (ref 135–145)
Total Bilirubin: 1.8 mg/dL — ABNORMAL HIGH (ref 0.3–1.2)
Total Protein: 4.3 g/dL — ABNORMAL LOW (ref 6.5–8.1)

## 2021-06-30 LAB — CBC WITH DIFFERENTIAL/PLATELET
Abs Immature Granulocytes: 0.84 10*3/uL — ABNORMAL HIGH (ref 0.00–0.07)
Basophils Absolute: 0.1 10*3/uL (ref 0.0–0.1)
Basophils Relative: 0 %
Eosinophils Absolute: 0 10*3/uL (ref 0.0–0.5)
Eosinophils Relative: 0 %
HCT: 24.5 % — ABNORMAL LOW (ref 36.0–46.0)
Hemoglobin: 8.6 g/dL — ABNORMAL LOW (ref 12.0–15.0)
Immature Granulocytes: 3 %
Lymphocytes Relative: 1 %
Lymphs Abs: 0.3 10*3/uL — ABNORMAL LOW (ref 0.7–4.0)
MCH: 32.2 pg (ref 26.0–34.0)
MCHC: 35.1 g/dL (ref 30.0–36.0)
MCV: 91.8 fL (ref 80.0–100.0)
Monocytes Absolute: 0.9 10*3/uL (ref 0.1–1.0)
Monocytes Relative: 3 %
Neutro Abs: 27.9 10*3/uL — ABNORMAL HIGH (ref 1.7–7.7)
Neutrophils Relative %: 93 %
Platelets: 85 10*3/uL — ABNORMAL LOW (ref 150–400)
RBC: 2.67 MIL/uL — ABNORMAL LOW (ref 3.87–5.11)
RDW: 17.1 % — ABNORMAL HIGH (ref 11.5–15.5)
WBC: 30 10*3/uL — ABNORMAL HIGH (ref 4.0–10.5)
nRBC: 0 % (ref 0.0–0.2)

## 2021-06-30 LAB — POCT I-STAT EG7
Acid-base deficit: 9 mmol/L — ABNORMAL HIGH (ref 0.0–2.0)
Bicarbonate: 17.2 mmol/L — ABNORMAL LOW (ref 20.0–28.0)
Calcium, Ion: 0.92 mmol/L — ABNORMAL LOW (ref 1.15–1.40)
HCT: 25 % — ABNORMAL LOW (ref 36.0–46.0)
Hemoglobin: 8.5 g/dL — ABNORMAL LOW (ref 12.0–15.0)
O2 Saturation: 56 %
Patient temperature: 98.1
Potassium: 3.7 mmol/L (ref 3.5–5.1)
Sodium: 142 mmol/L (ref 135–145)
TCO2: 18 mmol/L — ABNORMAL LOW (ref 22–32)
pCO2, Ven: 37.2 mmHg — ABNORMAL LOW (ref 44.0–60.0)
pH, Ven: 7.271 (ref 7.250–7.430)
pO2, Ven: 33 mmHg (ref 32.0–45.0)

## 2021-06-30 LAB — BASIC METABOLIC PANEL
Anion gap: 6 (ref 5–15)
BUN: 42 mg/dL — ABNORMAL HIGH (ref 8–23)
CO2: 19 mmol/L — ABNORMAL LOW (ref 22–32)
Calcium: 8.2 mg/dL — ABNORMAL LOW (ref 8.9–10.3)
Chloride: 114 mmol/L — ABNORMAL HIGH (ref 98–111)
Creatinine, Ser: 3.14 mg/dL — ABNORMAL HIGH (ref 0.44–1.00)
GFR, Estimated: 13 mL/min — ABNORMAL LOW (ref >60)
Glucose, Bld: 139 mg/dL — ABNORMAL HIGH (ref 70–99)
Potassium: 4.3 mmol/L (ref 3.5–5.1)
Sodium: 139 mmol/L (ref 135–145)

## 2021-06-30 LAB — BRAIN NATRIURETIC PEPTIDE: B Natriuretic Peptide: 2437.9 pg/mL — ABNORMAL HIGH (ref 0.0–100.0)

## 2021-06-30 LAB — D-DIMER, QUANTITATIVE: D-Dimer, Quant: 15.24 ug/mL-FEU — ABNORMAL HIGH (ref 0.00–0.50)

## 2021-06-30 LAB — PROTIME-INR
INR: 2.4 — ABNORMAL HIGH (ref 0.8–1.2)
Prothrombin Time: 26.5 seconds — ABNORMAL HIGH (ref 11.4–15.2)

## 2021-06-30 LAB — GLUCOSE, CAPILLARY
Glucose-Capillary: 96 mg/dL (ref 70–99)
Glucose-Capillary: 100 mg/dL — ABNORMAL HIGH (ref 70–99)
Glucose-Capillary: 114 mg/dL — ABNORMAL HIGH (ref 70–99)

## 2021-06-30 LAB — FERRITIN: Ferritin: 1143 ng/mL — ABNORMAL HIGH (ref 11–307)

## 2021-06-30 LAB — C-REACTIVE PROTEIN: CRP: 19.1 mg/dL — ABNORMAL HIGH (ref <1.0)

## 2021-06-30 LAB — PHOSPHORUS: Phosphorus: 4.7 mg/dL — ABNORMAL HIGH (ref 2.5–4.6)

## 2021-06-30 LAB — MAGNESIUM
Magnesium: 2.2 mg/dL (ref 1.7–2.4)
Magnesium: 2.1 mg/dL (ref 1.7–2.4)

## 2021-06-30 MED ORDER — AMIODARONE LOAD VIA INFUSION
150.0000 mg | Freq: Once | INTRAVENOUS | Status: AC
  Administered 2021-06-30: 150 mg via INTRAVENOUS
  Filled 2021-06-30: qty 83.34

## 2021-06-30 MED ORDER — PROSOURCE TF PO LIQD: 45.0000 mL | Freq: Every day | ORAL | Status: DC

## 2021-06-30 MED ORDER — AMIODARONE HCL IN DEXTROSE 360-4.14 MG/200ML-% IV SOLN
60.0000 mg/h | INTRAVENOUS | Status: AC
  Administered 2021-06-30 – 2021-07-01 (×2): 60 mg/h via INTRAVENOUS
  Filled 2021-06-30 (×2): qty 200

## 2021-06-30 MED ORDER — AMIODARONE IV BOLUS ONLY 150 MG/100ML
150.0000 mg | Freq: Once | INTRAVENOUS | Status: AC
  Administered 2021-06-30: 150 mg via INTRAVENOUS
  Filled 2021-06-30: qty 100

## 2021-06-30 MED ORDER — SODIUM CHLORIDE 0.9% FLUSH
10.0000 mL | Freq: Two times a day (BID) | INTRAVENOUS | Status: DC
  Administered 2021-06-30: 20 mL
  Administered 2021-06-30 – 2021-07-01 (×2): 10 mL

## 2021-06-30 MED ORDER — AMIODARONE HCL IN DEXTROSE 360-4.14 MG/200ML-% IV SOLN
30.0000 mg/h | INTRAVENOUS | Status: DC
  Administered 2021-07-01 (×2): 30 mg/h via INTRAVENOUS
  Filled 2021-06-30: qty 200

## 2021-06-30 MED ORDER — FUROSEMIDE 10 MG/ML IJ SOLN
40.0000 mg | Freq: Once | INTRAMUSCULAR | Status: AC
  Administered 2021-06-30: 40 mg via INTRAVENOUS
  Filled 2021-06-30: qty 4

## 2021-06-30 MED ORDER — QUETIAPINE FUMARATE 25 MG PO TABS: 25.0000 mg | ORAL_TABLET | Freq: Every day | ORAL | Status: DC

## 2021-06-30 MED ORDER — CALCIUM GLUCONATE-NACL 1-0.675 GM/50ML-% IV SOLN
1.0000 g | Freq: Once | INTRAVENOUS | Status: AC
  Administered 2021-06-30: 1000 mg via INTRAVENOUS
  Filled 2021-06-30: qty 50

## 2021-06-30 MED ORDER — ATORVASTATIN CALCIUM 10 MG PO TABS: 20.0000 mg | ORAL_TABLET | Freq: Every day | ORAL | Status: DC

## 2021-06-30 MED ORDER — HEPARIN SODIUM (PORCINE) 5000 UNIT/ML IJ SOLN
5000.0000 [IU] | Freq: Three times a day (TID) | INTRAMUSCULAR | Status: DC
  Administered 2021-06-30 – 2021-07-01 (×3): 5000 [IU] via SUBCUTANEOUS
  Filled 2021-06-30 (×4): qty 1

## 2021-06-30 MED ORDER — FENTANYL CITRATE (PF) 100 MCG/2ML IJ SOLN
25.0000 ug | INTRAMUSCULAR | Status: DC | PRN
  Administered 2021-06-30 (×3): 50 ug via INTRAVENOUS
  Filled 2021-06-30 (×4): qty 2

## 2021-06-30 MED ORDER — ZINC SULFATE 220 (50 ZN) MG PO CAPS: 220.0000 mg | ORAL_CAPSULE | Freq: Every day | ORAL | Status: DC

## 2021-06-30 MED ORDER — HYDROCORTISONE NA SUCCINATE PF 100 MG IJ SOLR
50.0000 mg | Freq: Two times a day (BID) | INTRAMUSCULAR | Status: DC
  Administered 2021-06-30 – 2021-07-01 (×2): 50 mg via INTRAVENOUS
  Filled 2021-06-30 (×2): qty 2

## 2021-06-30 MED ORDER — SODIUM CHLORIDE 0.9% FLUSH
10.0000 mL | INTRAVENOUS | Status: DC | PRN
  Administered 2021-07-01: 10 mL

## 2021-06-30 MED ORDER — OSMOLITE 1.5 CAL PO LIQD: 1000.0000 mL | ORAL | Status: DC

## 2021-06-30 MED ORDER — ASCORBIC ACID 500 MG PO TABS: 500.0000 mg | ORAL_TABLET | Freq: Every day | ORAL | Status: DC

## 2021-06-30 NOTE — Progress Notes (Signed)
[facility] Gastroenterology Progress Note  Monique Lin 87 y.o. 1927-02-09  CC:  Cholangitis  Subjective: Patient is confused. She is moaning and yelling "ow" but is not able to participate in conversation.  ROS : Review of Systems  Unable to perform ROS: Mental status change   Objective: Vital signs in last 24 hours: Vitals:   06/30/21 0600 06/30/21 0714  BP: (!) 118/52   Pulse: 94   Resp: 15   Temp:  98.1 F (36.7 C)  SpO2: 99%     Physical Exam:  General:  Awake, confused, mild acute distress  Head:  Normocephalic, without obvious abnormality, atraumatic  Eyes:  Anicteric sclera, EOM's intact,   Lungs:   Clear to auscultation bilaterally, respirations unlabored  Heart:  Tachycardic, irregularly irregular  Abdomen:   Soft with mild epigastric tenderness to palpation, normoactive bowel sounds     Lab Results: Recent Labs    06/29/21 0259 06/29/21 1809 06/30/21 0507  NA 138 138 138  K 4.3 3.8 3.9  CL 113* 115* 114*  CO2 16* 17* 18*  GLUCOSE 176* 108* 104*  BUN 32* 34* 36*  CREATININE 2.55* 2.65* 2.84*  CALCIUM 7.4* 7.4* 7.8*  MG 2.1  --  2.2  PHOS 4.1  --  4.7*   Recent Labs    06/29/21 0259 06/30/21 0507  AST 147* 97*  ALT 66* 65*  ALKPHOS 325* 264*  BILITOT 2.2* 1.8*  PROT 3.5* 4.3*  ALBUMIN 1.7* 1.9*   Recent Labs    06/29/21 0259 06/30/21 0507  WBC 16.9* 30.0*  NEUTROABS 14.5* 27.9*  HGB 8.5* 8.6*  HCT 24.7* 24.5*  MCV 92.2 91.8  PLT 84* 85*   Recent Labs    06/28/21 1525  LABPROT 20.8*  INR 1.8*    Assessment: Cholangitis with sepsis and AMS -WBCs continue to rise, now 30.0 -T. Bili 1.8/ AST 97/ ALT 65/ ALP 264 -Requiring pressors  A fib with RVR  AKI: BUN 36/ Cr 2.84  History of GIST, on Gleevec  COVID-19 infection  Plan: Dr. Paulita Fujita discussed the overall prognosis and options with the patient's daughter.  He discussed proceeding with ERCP, which could be challenging given her overall health status, and anesthesia could lead  to fatality. However, not performing the procedure could also lead to death. Patient's daughter wants to take time to decide how she wants to proceed.  Continue antibiotics and supportive care.  [facility] will follow.   Salley Slaughter PA-C 06/30/2021, 8:40 AM  Contact #  [phone]

## 2021-06-30 NOTE — Progress Notes (Signed)
Daily Progress Note   Patient Name: Monique Lin       Date: 06/30/2021 DOB: September 03, 1927  Age: 87 y.o. MRN#: [id number] Attending Physician: Margaretha Seeds, MD Primary Care Physician: [name], MD Admit Date: 06/26/2021  Reason for Consultation/Follow-up: Establishing goals of care  Subjective: Patient continues to do poorly.  Noted Dr. Paulita Fujita and Dr. Loanne Drilling had goals of care discussion with daughter today.  I called daughter- she did not have any questions or concerns that she wished to discuss.   Review of Systems  All other systems reviewed and are negative.  Length of Stay: 3  Current Medications: Scheduled Meds:   artificial tears   Left Eye QHS   [START ON 17-Jul-2021] vitamin C  500 mg Per Tube Daily   [START ON 07/17/2021] atorvastatin  20 mg Per Tube Daily   Chlorhexidine Gluconate Cloth  6 each Topical Daily   docusate sodium  100 mg Oral BID   heparin injection (subcutaneous)  5,000 Units Subcutaneous Q8H   hydrocortisone sodium succinate  50 mg Intravenous Q12H   levothyroxine  25 mcg Intravenous Daily   mouth rinse  15 mL Mouth Rinse BID   pantoprazole (PROTONIX) IV  40 mg Intravenous Q12H   polyvinyl alcohol  1 drop Left Eye TID   sodium chloride flush  10-40 mL Intracatheter Q12H   sodium chloride flush  3 mL Intravenous Q12H   sodium chloride flush  3 mL Intravenous Q12H   sodium chloride flush  5 mL Intracatheter Q12H   [START ON 07-17-2021] zinc sulfate  220 mg Per Tube Daily    Continuous Infusions:  sodium chloride     sodium chloride Stopped (06/28/21 0100)   cefTRIAXone (ROCEPHIN)  IV Stopped (06/29/21 1343)   metronidazole 500 mg (06/30/21 0716)   norepinephrine (LEVOPHED) Adult infusion 16 mcg/min (06/30/21 1100)   remdesivir 100 mg in NS 100 mL  200 mL/hr at 06/30/21 1100   vasopressin 0.03 Units/min (06/30/21 1100)    PRN Meds: sodium chloride, acetaminophen, albuterol, albuterol, artificial tears, bisacodyl, chlorpheniramine-HYDROcodone, fentaNYL (SUBLIMAZE) injection, guaiFENesin-dextromethorphan, haloperidol lactate, ondansetron **OR** ondansetron (ZOFRAN) IV, polyethylene glycol, sodium chloride flush, sodium chloride flush, sodium phosphate  Physical Exam Vitals and nursing note reviewed.  Constitutional:      Appearance: She is ill-appearing and toxic-appearing.  Eyes:  General: Scleral icterus present.  Pulmonary:     Comments: Respirations appear labored Neurological:     Comments: Lethargic, confused per RN report            Vital Signs: BP (!) 118/52   Pulse 94   Temp 98.2 F (36.8 C) (Axillary)   Resp 15   Wt 85.9 kg   SpO2 99%   BMI 33.55 kg/m  SpO2: SpO2: 99 % O2 Device: O2 Device: Nasal Cannula O2 Flow Rate: O2 Flow Rate (L/min): 2 L/min  Intake/output summary:  Intake/Output Summary (Last 24 hours) at 06/30/2021 1406 Last data filed at 06/30/2021 1100 Gross per 24 hour  Intake 728.28 ml  Output 250 ml  Net 478.28 ml   LBM: Last BM Date: 06/29/21 Baseline Weight: Weight: 83 kg Most recent weight: Weight: 85.9 kg       Palliative Assessment/Data: PPS: 10%      Patient Active Problem List   Diagnosis Date Noted   DNR (do not resuscitate) 06/29/2021   Acute hypoxemic respiratory failure due to COVID-19 ([place]) 06/12/2021   Stage 4 chronic kidney disease ([place]) 06/16/2021   Class 1 obesity due to excess calories with body mass index (BMI) of 32.0 to 32.9 in adult 06/11/2021   Acute hypoxemic respiratory failure (HCC) 06/23/2021   Hypertension 02/01/2021   Bell's palsy 02/01/2021   Facial droop    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 female  with past medical history of GIST- stable currently on Gleevec last scan on 06/08/21 without progression, anemia (recently transfused at cancer center) admitted on 06/18/2021 with COVID+, septic shock- requiring IV pressors- bacteremia growing E. Coli and Klebsiella. Workup has revealed possible GI source of infection with increased biliary ductal dilation, cholelithiasis and sludge. Her Cr is trending up. Palliative consulted for [place].   Assessment/Recommendations/Plan  Daughter sounded frustrated when I called-  gave emotional support- did not press GOC discussion today as it has already been had this morning with attending provider Will continue to follow and discuss more with family tomorrow  Goals of Care and Additional Recommendations: Limitations on Scope of Treatment: Full Scope Treatment  Code Status: DNR  Prognosis:  Unable to determine  Discharge Planning: To Be Determined  Care plan was discussed with patient's daughter and care team.  Thank you for allowing the Palliative Medicine Team to assist in the care of this patient.   Total time: 28 mins Greater than 50%  of this time was spent counseling and coordinating care related to the above assessment and plan.  Mariana Kaufman, AGNP-C Palliative Medicine   Please contact Palliative Medicine Team phone at [phone] for questions and concerns.

## 2021-06-30 NOTE — Progress Notes (Signed)
NAME:  Monique Lin, MRN:  [id number], DOB:  [birth year], LOS: 3 ADMISSION DATE:  06/30/2021, CONSULTATION DATE:  7/25 REFERRING MD:  Dr. Lorin Mercy, CHIEF COMPLAINT:  Hypotension, COVID   History of Present Illness:  87 year old female with PMH as below, which is significant for gastrointestinal stromal tumor followed by Dr. Marin Olp on treatement with Gleevec, HTN, CKD, and hypothyroidism. She presented to [facility] ED 7/25 with complaints of malaise, body aches, and shortness of breath. Tested positive for COVID at home on the day of presentation. She is status post the initial series of vaccination in 2021. Upon arrival to the emergency department she was noted to be hypoxemic to the mid 80s on room air. She was placed on 2L nasal cannula and sats improved to the high 90s. COVID testing in the ED was confirmed positive. She was admitted to the hospitalists service. COVID labs were ordered including D. Dimer, which was positive. VQ scan was obtained with low probability of PE. As the day progressed she became hypotensive despite fluid bolus. PCCM consulted.   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   Significant Hospital Events: Including procedures, antibiotic start and stop dates in addition to other pertinent events   7/25 admit for COVID, hypotension 7/26 PCT with choledocholithiasis, unable to place PBD,  Cholecystostomy tube placed, LIJ placed  Interim History / Subjective:  Ill appearing woman moaning intermittently. Not answering questions or following commands.  Objective   Blood pressure (!) 118/52, pulse 94, temperature 98.2 F (36.8 C), temperature source Axillary, resp. rate 15, weight 85.9 kg, SpO2 99 %. CVP:  [13 mmHg-19 mmHg] 19 mmHg      Intake/Output Summary (Last 24 hours) at 06/30/2021 9892 Last data filed at 06/30/2021  0600 Gross per 24 hour  Intake 617.8 ml  Output 525 ml  Net 92.8 ml    Filed Weights   06/21/2021 1200 06/12/2021 2029  Weight: 83 kg 85.9 kg   Examination: General: Obese elderly female, awake, appears uncomfortable HENT: [place]/AT, PERRL, no JVD Lungs: Clear bilateral breath sounds, mouth breathing, no accessory muscle use, O2 sats improvined from 80s to 90s when more awake Cardiovascular: regular rate and rhythm, no MRG Abdomen: Soft, active bowel sounds, bilious drainage from cholecystostomy  Extremities: No acute deformity. 2+ edema  Neuro: confused, moans to pain, eyes open, withdrawing from pain, not following commands Resolved Hospital Problem list     Assessment & Plan:  Septic shock secondary to Klebsiella pneumoniae likely due to cholangitis  BCID with Enterobacterales, E. Coli ,and klebsiella. Cultures positive for ampicillin resistant klebsiella pneumoniae.  WBC 16.9>30. Pressor requirements slowly improving. Liver enzymes improving. - GI and IR consulted - s/p PTC with cholecystostomy - Poor candidate for ERCP given multiorgan dysfunction, continue discussing goals of care with family - Levophed as needed to maintain MAP >65 - continue vasopressin  - Continue ceftriaxone and metronidazole - Continue midodrine - Monitor LFTs  Acute hypoxemic respiratory failure COVID pneumonia Worsening O2 saturations improves when she is more awake. ABG with improved acidosis. CXR with low lung volumes  - Supplemental O2 to keep SpO2 > 92% - decrease Hydrocortisone 50 q12 given worsening encephalopathy  - Trend LDH, Ferritin, CRP - Remdesivir for total of 5 days  Acute encephalopathy Worsening mental status this morning. Likely multifactorial from worsening sepsis, hypoxia, steroids, delirium, and pain - Decrease  hydrocortisone to 50 mg q12 - NG tube placement - Fentanyl 25-50 q2 - Seroquel 25 mg  QHS per tube - Haldol 2 mg q6 PRN   AKI on CKD Continues to worsen despite  improved BP on pressors, UOP of 565m somewhat improved after foley placement. Electrolytes stable.  Concerning for volume overload. -BNP - Foley in place - monitor I/Os - monitor BMP, avoid nephrotoxins, renally dose medications   Atrial fibrillation with RVR S/p bolus of amiodarone, remains in sinus rhythm - Continue to monitor on tele  Anemia: hemoglobin above baseline on admission - Epoetin alfa weekly, last dose 06/28/2021 - Trend hemoglobin  Gastrointestinal stromal tumor: followed by Dr. EMarin Olp  - On Gleevec, tumor stable by recent imaging.   Hypothyroid - Synthroid  Goals of care Worsening mental status, liver and and renal function, now with new onset atrial fibrillation.  -palliative care consult -Family considering transitioning to comfort care but unable to make a decision   Best Practice (right click and "Reselect all SmartList Selections" daily)  Diet/type: NPO plan for ng tube today DVT prophylaxis: prophylactic heparin  GI prophylaxis: PPI Lines: N/A Foley:  N/A Code Status:  DNR Last date of multidisciplinary goals of care discussion [7/28 Dr. ELoanne Drillingdiscussed with daughter worsening renal function and encephalopathy and that dialysis would be a temporizing measure. Daughter states patient was made DNR as they were concerned patient would die if intubated. Also discussed that ERCP at this point may require intubation and unlikely patient would unlikely to be able to extubate. Daughter will continue to think over goals of care.]  Labs   CBC: Recent Labs  Lab 06/29/2021 0334 06/28/21 0410 06/29/21 0259 06/30/21 0507  WBC 3.7* 18.5* 16.9* 30.0*  NEUTROABS  --  17.2* 14.5* 27.9*  HGB 9.8* 9.3* 8.5* 8.6*  HCT 29.0* 27.2* 24.7* 24.5*  MCV 96.3 94.1 92.2 91.8  PLT PLATELET CLUMPS NOTED ON SMEAR, UNABLE TO ESTIMATE 81* 84* 85*     Basic Metabolic Panel: Recent Labs  Lab 06/14/2021 0334 06/28/21 0410 06/29/21 0259 06/29/21 1809 06/30/21 0507  NA 135  141 138 138 138  K 3.9 4.1 4.3 3.8 3.9  CL 110 114* 113* 115* 114*  CO2 20* 20* 16* 17* 18*  GLUCOSE 99 90 176* 108* 104*  BUN 23 25* 32* 34* 36*  CREATININE 1.98* 2.19* 2.55* 2.65* 2.84*  CALCIUM 8.2* 8.1* 7.4* 7.4* 7.8*  MG  --  1.4* 2.1  --  2.2  PHOS  --  5.0* 4.1  --  4.7*    GFR: Estimated Creatinine Clearance: 13.1 mL/min (A) (by C-G formula based on SCr of 2.84 mg/dL (H)). Recent Labs  Lab 06/26/2021 0334 06/21/2021 0902 06/28/21 0410 06/29/21 0259 06/29/21 0806 06/29/21 1046 06/30/21 0507  PROCALCITON  --  4.54  --   --   --   --   --   WBC 3.7*  --  18.5* 16.9*  --   --  30.0*  LATICACIDVEN  --  1.6  --   --  3.6* 1.4  --      Liver Function Tests: Recent Labs  Lab 06/28/21 0410 06/29/21 0259 06/30/21 0507  AST 221* 147* 97*  ALT 109* 66* 65*  ALKPHOS 354* 325* 264*  BILITOT 1.9* 2.2* 1.8*  PROT 4.5* 3.5* 4.3*  ALBUMIN 2.1* 1.7* 1.9*    Recent Labs  Lab 06/28/21 0410  LIPASE 26    No results for input(s): AMMONIA in the last 168 hours.  ABG  Component Value Date/Time   PHART 7.472 (H) 04/02/2018 0158   PCO2ART 23.8 (L) 04/02/2018 0158   PO2ART 191.0 (H) 04/02/2018 0158   HCO3 17.1 (L) 06/29/2021 0806   TCO2 18 (L) 04/02/2018 0158   ACIDBASEDEF 10.4 (H) 06/29/2021 0806   O2SAT 44.5 06/29/2021 0806      Coagulation Profile: Recent Labs  Lab 06/28/21 1525  INR 1.8*     Cardiac Enzymes: Recent Labs  Lab 06/19/2021 2114  CKTOTAL 341*     HbA1C: No results found for: HGBA1C  CBG: Recent Labs  Lab 06/29/21 0333 06/29/21 0728 06/29/21 1109 06/29/21 1614 06/29/21 1957  GLUCAP 150* 98 104* 105* 103*      Past Medical History:  She,  has a past medical history of Anemia, 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   Surgical History:   Past Surgical History:  Procedure Laterality Date   BIOPSY  01/12/2020   Procedure: BIOPSY;  Surgeon:  Ronald Lobo, MD;  Location: [place];  Service: Endoscopy;;   ESOPHAGOGASTRODUODENOSCOPY (EGD) WITH PROPOFOL N/A 01/12/2020   Procedure: ESOPHAGOGASTRODUODENOSCOPY (EGD) WITH PROPOFOL;  Surgeon: Ronald Lobo, MD;  Location: [place];  Service: Endoscopy;  Laterality: N/A;   ESOPHAGOGASTRODUODENOSCOPY (EGD) WITH PROPOFOL N/A 01/15/2020   Procedure: ESOPHAGOGASTRODUODENOSCOPY (EGD) WITH PROPOFOL;  Surgeon: Arta Silence, MD;  Location: [place];  Service: Endoscopy;  Laterality: N/A;   EUS N/A 01/15/2020   Procedure: UPPER ENDOSCOPIC ULTRASOUND (EUS) RADIAL;  Surgeon: Arta Silence, MD;  Location: [place];  Service: Endoscopy;  Laterality: N/A;   FINE NEEDLE ASPIRATION  01/15/2020   Procedure: FINE NEEDLE ASPIRATION (FNA) LINEAR;  Surgeon: Arta Silence, MD;  Location: [place];  Service: Endoscopy;;   IR CATHETER TUBE CHANGE  04/14/2018   IR PERCUTANEOUS TRANSHEPATIC CHOLANGIOGRAM  06/28/2021   IR THORACENTESIS ASP PLEURAL SPACE W/IMG GUIDE  04/05/2018   NASAL SINUS SURGERY     stomach tumor removal       Social History:   reports that she has never smoked. She has never used smokeless tobacco. She reports that she does not drink alcohol and does not use drugs.   Family History:  Her Family history is unknown by patient.   Allergies Allergies  Allergen Reactions   Codeine Other (See Comments)    Stomach cramps   Penicillins Other (See Comments)    "Bad stomach cramps" Has patient had a PCN reaction causing immediate rash, facial/tongue/throat swelling, SOB or lightheadedness with hypotension: Unk Has patient had a PCN reaction causing severe rash involving mucus membranes or skin necrosis: Unk Has patient had a PCN reaction that required hospitalization: Unk Has patient had a PCN reaction occurring within the last 10 years: No If all of the above answers are "NO", then may proceed with Cephalosporin use.       Home Medications  Prior to Admission  medications   Medication Sig Start Date End Date Taking? Authorizing Provider  acetaminophen (TYLENOL) 650 MG CR tablet Take 650 mg by mouth every 8 (eight) hours as needed for pain.   Yes [provider]  albuterol (PROVENTIL) (2.5 MG/3ML) 0.083% nebulizer solution Take 2.5 mg by nebulization in the morning, at noon, in the evening, and at bedtime.   Yes [provider]  albuterol (VENTOLIN HFA) 108 (90 Base) MCG/ACT inhaler Inhale 1 puff into the lungs every 6 (six) hours as needed for wheezing or shortness of breath.   Yes [provider]  Delma Freeze  ALLERGY 180 MG tablet Take 180 mg by mouth in the morning.   Yes [provider]  alum & mag hydroxide-simeth (MAALOX/MYLANTA) 200-200-20 MG/5ML suspension Take 30 mLs by mouth every 6 (six) hours as needed for indigestion, heartburn or flatulence. 12/22/19  Yes [name], MD  artificial tears (LACRILUBE) OINT ophthalmic ointment Place into the left eye at bedtime. 02/01/21  Yes Elodia Florence., MD  atorvastatin (LIPITOR) 20 MG tablet Take 20 mg by mouth daily.   Yes [provider]  bisacodyl (DULCOLAX) 5 MG EC tablet Take 1 tablet (5 mg total) by mouth daily as needed for moderate constipation. 12/22/19  Yes [name], MD  carvedilol (COREG) 6.25 MG tablet Take 1 tablet (6.25 mg total) by mouth 2 (two) times daily with a meal. 01/30/20  Yes Ennever, Rudell Cobb, MD  Cholecalciferol (VITAMIN D-3) 125 MCG (5000 UT) TABS Take 5,000 Units by mouth daily.    Yes [provider]  Cyanocobalamin (VITAMIN B-12 PO) Take 500 mcg by mouth daily.    Yes [provider]  folic acid (FOLVITE) 132 MCG tablet Take 800 mcg by mouth daily.    Yes [provider]  gabapentin (NEURONTIN) 300 MG capsule Take 1 capsule (300 mg total) by mouth 2 (two) times daily. 01/16/20  Yes Mercy Riding, MD  hydroxypropyl methylcellulose / hypromellose (ISOPTO TEARS / GONIOVISC) 2.5 % ophthalmic solution Place  1 drop into the left eye 3 (three) times daily. Patient taking differently: Place 1 drop into the left eye 3 (three) times daily. Per daughter, she also uses it in the right eye but not often. Just as needed. 02/01/21  Yes Elodia Florence., MD  imatinib (GLEEVEC) 400 MG tablet Take 1 tablet (400 mg total) by mouth daily. Take with meals and large glass of water.Caution:Chemotherapy 04/22/21  Yes Volanda Napoleon, MD  losartan (COZAAR) 25 MG tablet Take 25 mg by mouth daily.   Yes [provider]  ondansetron (ZOFRAN) 8 MG tablet Take 1 tablet (8 mg total) by mouth every 8 (eight) hours as needed for nausea or vomiting. 03/15/20  Yes Ennever, Rudell Cobb, MD  OVER THE COUNTER MEDICATION Apply 1 application topically See admin instructions. HempVana cream - as needed for knee and shoulder cream   Yes [provider]  pantoprazole (PROTONIX) 40 MG tablet TAKE 1 TABLET(40 MG) BY MOUTH TWICE DAILY Patient taking differently: Take 40 mg by mouth in the morning and at bedtime. 09/22/20  Yes Ennever, Rudell Cobb, MD  SYNTHROID 50 MCG tablet Take 50 mcg by mouth daily before breakfast.   Yes [provider]  trolamine salicylate (ASPERCREME) 10 % cream Apply 1 application topically as needed for muscle pain.   Yes [provider]  vitamin E 180 MG (400 UNITS) capsule Take 400 Units by mouth daily.   Yes [provider]  zinc gluconate 50 MG tablet Take 50 mg by mouth daily.   Yes [provider]    Iona Beard, MD Internal medicine, PGY-2 06/30/21 7:12 AM Pager [phone]

## 2021-06-30 NOTE — Progress Notes (Signed)
Initial Nutrition Assessment  DOCUMENTATION CODES:   Obesity unspecified  INTERVENTION:   Once Cortrak tube placed, initiate tube feeds: - Start Osmolite 1.5 @ 20 ml/hr and advance by 10 ml q 8 hours to goal rate of 50 ml/hr (1200 ml/day) - ProSource TF 45 ml daily  Tube feeding regimen provides 1840 kcal, 86 grams of protein, and 914 ml of H2O.  NUTRITION DIAGNOSIS:   Inadequate oral intake related to lethargy/confusion as evidenced by NPO status.  GOAL:   Patient will meet greater than or equal to 90% of their needs  MONITOR:   Diet advancement, Labs, Weight trends, TF tolerance, Skin, I & O's  REASON FOR ASSESSMENT:   Consult Enteral/tube feeding initiation and management, Assessment of nutrition requirement/status  ASSESSMENT:   87 year old female who presented to the ED on 7/25 with SOB after testing positive for COVID-19. PMH of gastrointestinal stromal tumor on treatment with Gleevec, HTN, CKD stage IV, hypothyroidism. Pt admitted with acute hypoxemic respiratory failure secondary to COVID pneumonia, AKI on CKD, septic shock secondary to bacteremia and suspected cholangitis.  7/26 - s/p PTC with failed PBD, percutaneous cholecystostomy tube placement  Discussed pt with RN and during ICU rounds. Pt is a poor candidate for ERCP. Pt now with atrial fibrillation, oliguric renal failure. Pt is agitated and confused.  Per MD, plan is for Cortrak placement tomorrow. Consult received for tube feeding initiation and management.  Admit weight: 83 kg  No updated weight since 7/25. Reviewed weight history in chart. Pt's weight has fluctuated between 75-85 kg over the last year with no real trends noted. Pt currently with +3 pitting edema to BUE and BLE.  Medications reviewed and include: vitamin C 500 mg daily, colace, IV solu-cortef, IV protonix, zinc sulfate 220 mg daily, IV abx, remdesivir  Drips: levophed, vasopressin  Labs reviewed: BUN 36, creatinine 2.84, ionized  calcium 0.92, phosphorus 4.7, elevated LFTs, hemoglobin 8.5, HCT 25.0 CBG's: 84-150 x 24 hours  UOP: 525 ml x 24 hours I/O's: +4.0 L since admit  NUTRITION - FOCUSED PHYSICAL EXAM:  Deferred.  Diet Order:   Diet Order             Diet NPO time specified  Diet effective midnight                   EDUCATION NEEDS:   Not appropriate for education at this time  Skin:  Skin Assessment: Skin Integrity Issues: Other: MASD to breast and groin, laceration to R toe  Last BM:  06/29/21  Height:   Ht Readings from Last 1 Encounters:  04/22/21 _0  (1.6 m)    Weight:   Wt Readings from Last 1 Encounters:  06/07/2021 85.9 kg    BMI:  Body mass index is 33.55 kg/m.  Estimated Nutritional Needs:   Kcal:  1650-1850  Protein:  85-100 grams  Fluid:  1.6-1.8 L    Gustavus Bryant, MS, RD, LDN Inpatient Clinical Dietitian Please see AMiON for contact information.

## 2021-06-30 NOTE — Progress Notes (Signed)
[facility] Progress Note Patient Name: Monique Lin DOB: 11-22-1927 MRN: [id number]   Date of Service  06/30/2021  HPI/Events of Note  AFIB with ventricular rate = 140-150. Patient is on Norepinephrine and Vasopressin IV infusions.  eICU Interventions  Plan: Amiodarone IV bolus and infusion. BMP and Mg++ level STAT.     Intervention Category Major Interventions: Arrhythmia - evaluation and management  Monique Lin 06/30/2021, 10:22 PM

## 2021-07-01 ENCOUNTER — Inpatient Hospital Stay (HOSPITAL_COMMUNITY): Payer: Medicare Other

## 2021-07-01 DIAGNOSIS — K8050 Calculus of bile duct without cholangitis or cholecystitis without obstruction: Secondary | ICD-10-CM

## 2021-07-01 DIAGNOSIS — Z66 Do not resuscitate: Secondary | ICD-10-CM

## 2021-07-01 DIAGNOSIS — U071 COVID-19: Secondary | ICD-10-CM | POA: Diagnosis not present

## 2021-07-01 DIAGNOSIS — K8309 Other cholangitis: Secondary | ICD-10-CM

## 2021-07-01 DIAGNOSIS — A419 Sepsis, unspecified organism: Secondary | ICD-10-CM | POA: Diagnosis not present

## 2021-07-01 LAB — CBC WITH DIFFERENTIAL/PLATELET
Abs Immature Granulocytes: 4.54 10*3/uL — ABNORMAL HIGH (ref 0.00–0.07)
Basophils Absolute: 0.1 10*3/uL (ref 0.0–0.1)
Basophils Relative: 0 %
Eosinophils Absolute: 0 10*3/uL (ref 0.0–0.5)
Eosinophils Relative: 0 %
HCT: 26.5 % — ABNORMAL LOW (ref 36.0–46.0)
Hemoglobin: 8.7 g/dL — ABNORMAL LOW (ref 12.0–15.0)
Immature Granulocytes: 14 %
Lymphocytes Relative: 1 %
Lymphs Abs: 0.4 10*3/uL — ABNORMAL LOW (ref 0.7–4.0)
MCH: 31.8 pg (ref 26.0–34.0)
MCHC: 32.8 g/dL (ref 30.0–36.0)
MCV: 96.7 fL (ref 80.0–100.0)
Monocytes Absolute: 0.9 10*3/uL (ref 0.1–1.0)
Monocytes Relative: 3 %
Neutro Abs: 27.1 10*3/uL — ABNORMAL HIGH (ref 1.7–7.7)
Neutrophils Relative %: 82 %
Platelets: 88 10*3/uL — ABNORMAL LOW (ref 150–400)
RBC: 2.74 MIL/uL — ABNORMAL LOW (ref 3.87–5.11)
RDW: 18.1 % — ABNORMAL HIGH (ref 11.5–15.5)
Smear Review: DECREASED
WBC: 33.1 10*3/uL — ABNORMAL HIGH (ref 4.0–10.5)
nRBC: 0.1 % (ref 0.0–0.2)

## 2021-07-01 LAB — GLUCOSE, CAPILLARY
Glucose-Capillary: 95 mg/dL (ref 70–99)
Glucose-Capillary: 125 mg/dL — ABNORMAL HIGH (ref 70–99)
Glucose-Capillary: 122 mg/dL — ABNORMAL HIGH (ref 70–99)

## 2021-07-01 LAB — CULTURE, BLOOD (ROUTINE X 2)
Special Requests: ADEQUATE
Special Requests: ADEQUATE

## 2021-07-01 LAB — D-DIMER, QUANTITATIVE: D-Dimer, Quant: 16.44 ug/mL-FEU — ABNORMAL HIGH (ref 0.00–0.50)

## 2021-07-01 LAB — PHOSPHORUS: Phosphorus: 6.2 mg/dL — ABNORMAL HIGH (ref 2.5–4.6)

## 2021-07-01 LAB — C-REACTIVE PROTEIN: CRP: 20.3 mg/dL — ABNORMAL HIGH (ref <1.0)

## 2021-07-01 LAB — COMPREHENSIVE METABOLIC PANEL
ALT: 56 U/L — ABNORMAL HIGH (ref 0–44)
AST: 60 U/L — ABNORMAL HIGH (ref 15–41)
Albumin: 1.9 g/dL — ABNORMAL LOW (ref 3.5–5.0)
Alkaline Phosphatase: 213 U/L — ABNORMAL HIGH (ref 38–126)
Anion gap: 6 (ref 5–15)
BUN: 44 mg/dL — ABNORMAL HIGH (ref 8–23)
CO2: 20 mmol/L — ABNORMAL LOW (ref 22–32)
Calcium: 8.4 mg/dL — ABNORMAL LOW (ref 8.9–10.3)
Chloride: 113 mmol/L — ABNORMAL HIGH (ref 98–111)
Creatinine, Ser: 3.3 mg/dL — ABNORMAL HIGH (ref 0.44–1.00)
GFR, Estimated: 13 mL/min — ABNORMAL LOW (ref >60)
Glucose, Bld: 133 mg/dL — ABNORMAL HIGH (ref 70–99)
Potassium: 4.5 mmol/L (ref 3.5–5.1)
Sodium: 139 mmol/L (ref 135–145)
Total Bilirubin: 1.6 mg/dL — ABNORMAL HIGH (ref 0.3–1.2)
Total Protein: 4.4 g/dL — ABNORMAL LOW (ref 6.5–8.1)

## 2021-07-01 LAB — FERRITIN: Ferritin: 947 ng/mL — ABNORMAL HIGH (ref 11–307)

## 2021-07-01 LAB — LIPASE, BLOOD: Lipase: 46 U/L (ref 11–51)

## 2021-07-01 LAB — MAGNESIUM: Magnesium: 2.3 mg/dL (ref 1.7–2.4)

## 2021-07-01 MED ORDER — FENTANYL 2500MCG IN NS 250ML (10MCG/ML) PREMIX INFUSION
0.0000 ug/h | INTRAVENOUS | Status: DC
Start: 2021-07-01 — End: 2021-07-02

## 2021-07-04 ENCOUNTER — Telehealth: Payer: Self-pay

## 2021-07-04 LAB — CULTURE, BLOOD (ROUTINE X 2)
Culture: NO GROWTH
Culture: NO GROWTH

## 2021-07-04 NOTE — Death Summary Note (Signed)
DEATH SUMMARY   Patient Details  Name: Monique Lin MRN: [id number] DOB: Sep 14, 1927  Admission/Discharge Information   Admit Date:  07-24-21  Date of Death:  Jul 28, 2021   Time of Death:  02-22-1635  Length of Stay: 4  Referring Physician: [name], MD   Reason(s) for Hospitalization  Sepsis secondary to cholangitis  Diagnoses  Preliminary cause of death:  Secondary Diagnoses (including complications and co-morbidities):  Principal Problem:   Acute hypoxemic respiratory failure due to COVID-19 [facility]) Active Problems:   Essential hypertension   Hypothyroidism   Gastrointestinal stromal tumor (GIST) associated with mutation in KIT gene ([place])   Hypertension   Bell's palsy   Stage 4 chronic kidney disease ([place])   Class 1 obesity due to excess calories with body mass index (BMI) of 32.0 to 32.9 in adult   Acute hypoxemic respiratory failure ([place])   DNR (do not resuscitate)   [facility] (including significant findings, care, treatment, and services provided and events leading to death)  Monique Lin is a 87 y.o. year old female with a past medical history of gastrointestinal stromal tumor, CKD, CHF, HTN, and hypothyroidism .who presented to [facility] on Jul 25, 2023 with malaise, and shortness of breath. Had tested positive home COVID test on day of admission. Noted to be hypoxic in the ED and placed on 2L [place]. Underwent VQ scan for PE with low probability for PE. Confirmed COVID positive in ED, started on remdesivir, and initially admitted to hospitalist service but became hypotensive despite fluid resuscitation and admitted to the ICU on July 24, 2021. Discussed possibility of intubation with daughters on admission to the ICU and patient was made DNR at that time. CXR with bibasilar opacities and small left pleural effusion. Subsequently started on stress dose steroids and pressors. In addition to antibiotics for CAP coverage. Bacterial cultures positive for Klebsiella pneumoniae and E.  Coli and continued on antibiotics. Noted with AKI. US abdomen for abdominal pain and elevated LFTs revealed biliary duct dilation of 13 mm. CT abdomen with choledocholithiasis and biliary dilation.  GI consulted with concern for cholangitis. Discussed high risk of mortality with ERCP and patient underwent IR guided PTC on with placement of cholecystostomy after failure to place PBD due to patient agitation. LFTs improved with biliary drainage however patient unfortunately developed worsening renal failure, worsening leukocytosis. Patient during this time with worsening mental status with hyperactive delirium.Noted to be in afib with RVR during admission receiving intermittent boluses of amiodarone with conversion, however started on amiodarone infusion on 7/28. Developed increased pressor requirements on vasopressin and high hose levophed. Multiple discussion with daughters [name] and [place] concerning severity of patient's illness. Discussed that patient would likely be unable to tolerate ERCP and dialysis would not improve underlying cause of her illness and agreeable to not pursing ERCP or dialysis. Despite supportive medical care on multiple pressors and antibiotics patient continued to decline becoming difficult to arouse with agonal breathing on morning of 2023-07-29. Discussed transition to comfort care with daughter in the morning but daughters unable to come to a decision. Patient developed periods of apnea, hypotension, and bradycardia later in the day and daughters were notified and were able to come to bedside. Agreeable to no further escalation in care. Family expressed to keep patient on current medications in hopes more family could visit prior to placing on comfort care. Fentanyl drip ordered for distress but not started as patient appeared comfortable. Unfortunately patient passed away at 1626 on 2021-07-28 with daughter and grandson  at bedside.    Pertinent Labs and Studies  Significant Diagnostic  Studies CT ABDOMEN PELVIS WO CONTRAST  Result Date: 06/28/2021 CLINICAL DATA:  87 year old with history of GIST tumor with sepsis and cholangitis. Patient is being evaluated for biliary decompression. EXAM: CT ABDOMEN AND PELVIS WITHOUT CONTRAST TECHNIQUE: Multidetector CT imaging of the abdomen and pelvis was performed following the standard protocol without IV contrast. COMPARISON:  08/13/2020 and 06/08/2021 FINDINGS: Lower chest: Small bilateral pleural effusions. Volume loss in both lower lobes. Coronary artery calcifications. Hepatobiliary: There is a chronic low-density structure along the left hepatic dome most compatible with a cyst based on the previous contrast enhanced examination. Evaluation of the liver structures is limited on this exam without intravascular contrast. There are small gallstones. No significant gallbladder dilatation. There is probably mild intrahepatic biliary dilatation. Common bile duct is slightly prominent. Again noted is a large calcification in the distal common bile duct compatible with choledocholithiasis. This common bile duct stone measures up to 9 mm. Pancreas: Limited evaluation because due to GIST tumor and calcifications in this region. Spleen: Normal in size without focal abnormality. Adrenals/Urinary Tract: No gross abnormality. Left renal cysts. The left renal upper pole cyst is slightly hyperdense and poorly characterized on this examination. No acute abnormality to the right kidney. Negative for hydronephrosis. Fluid in the urinary bladder. Stomach/Bowel: Again noted is a poorly defined structure with calcifications around the stomach and this is compatible the patient's known GIST tumor. There is no significant bowel dilatation. No evidence for acute bowel inflammation. Vascular/Lymphatic: Diffuse atherosclerotic calcifications throughout the aorta without aneurysm. No significant abdominopelvic lymphadenopathy but limited on this examination. Reproductive: Again  noted are multiple uterine calcifications probably related to fibroids. No acute abnormality. Other: Mild mesenteric edema with extensive subcutaneous edema. There is periumbilical hernia with containing fat and there is some edema or fluid in this area. Negative for free air. Musculoskeletal: No acute bone abnormality. IMPRESSION: 1. Persistent large calcified stone in the distal common bile duct compatible with choledocholithiasis. Evidence for at least mild intrahepatic and extrahepatic biliary dilatation which is poorly characterized on this examination. Cholelithiasis. 2. Small bilateral pleural effusions with compressive atelectasis in the lower lobes bilaterally. 3. Known GIST tumor in the upper abdomen centered near the stomach and pancreas. This area is poorly characterized on this non contrast examination. 4. Diffuse subcutaneous edema. 5. Periumbilical hernia containing fat and this area appears to be edematous. Electronically Signed   By: Markus Daft M.D.   On: 06/28/2021 17:37   CT ABDOMEN PELVIS WO CONTRAST  Result Date: 06/08/2021 CLINICAL DATA:  Recurrent GI stromal tumor. Evaluate response to therapy. EXAM: CT CHEST, ABDOMEN AND PELVIS WITHOUT CONTRAST TECHNIQUE: Multidetector CT imaging of the chest, abdomen and pelvis was performed following the standard protocol without IV contrast. COMPARISON:  02/23/2021 FINDINGS: CT CHEST FINDINGS Cardiovascular: Limitations secondary to lack of IV contrast. Advanced aortic and branch vessel atherosclerosis. Tortuous thoracic aorta. Moderate cardiomegaly with trace pericardial fluid. Three vessel coronary artery calcification. Probable LAD stent. Mediastinum/Nodes: No mediastinal or definite hilar adenopathy, given limitations of unenhanced CT. The esophagus is moderately dilated and contrast filled throughout. Lungs/Pleura: Left-sided pleural thickening and trace fluid are felt to be similar. Chronic material within the left greater than right endobronchial  tree. Left greater than right lower lobe areas of consolidation are similar. A pleural-based anterior left lower lobe 4 mm nodule on 83/3 is unchanged. Musculoskeletal: Osteopenia.  Remote bilateral rib fractures. CT ABDOMEN PELVIS FINDINGS Hepatobiliary: High left  hepatic lobe cyst. Gallstones at up to 7 mm. The common duct is difficult to delineate, felt to measure on the order of 11 mm on 62/2. Distal common duct stone of 11 mm on 64/2 has enlarged since the prior exam, where it measured 7 mm. Pancreas: Not well evaluated secondary to lack of IV contrast. No gross abnormality identified. Spleen: Normal in size, without focal abnormality. Adrenals/Urinary Tract: Bilateral adrenal thickening. Left greater than right renal cortical thinning. Low-density left renal lesions are likely cysts. No hydronephrosis. Subtle pericystic edema including on 100/2. Stomach/Bowel: Surgical sutures about the gastric body. Apparent gastric antral wall thickening may be partially due to underdistention on 63/2. Scattered colonic diverticula. Proximal small bowel loops measure up to 3.9 cm today versus 3.7 cm on the prior. No focal transition point identified. Vascular/Lymphatic: Aortic atherosclerosis. No abdominopelvic adenopathy. Reproductive: Small calcified uterine fibroids.  No adnexal mass. Other: No significant free fluid.  Pelvic floor laxity. The low-density mass positioned posterior to the stomach is suboptimally evaluated secondary to noncontrast technique. Difficult to differentiate from adjacent pancreas. Felt to measure on the order of 7.2 x 5.4 cm on 56/2 . (Previously 8.2 x 5.5 cm.) Anasarca.  Fat containing ventral pelvic wall hernia. Musculoskeletal: Osteopenia.  Trace L3-4 and L4-5 anterolisthesis. IMPRESSION: 1. The dominant low-density mass positioned adjacent the posterior stomach is suboptimally evaluated secondary to lack of IV contrast. Felt to be similar to slightly decreased in size, given this limitation.  2. No new sites of disease identified. 3. Esophageal dilatation with contrast level throughout. Most consistent with dysmotility. 4. Chronic bibasilar airspace disease with material in the endobronchial tree, highly suspicious for chronic aspiration-given appearance of the esophagus. 5. Enlargement of a distal common duct stone with persistent mild biliary duct dilatation. Concurrent cholelithiasis. 6. Pericystic edema for which cystitis is suspected. 7. New anasarca.  Correlate with fluid overload. 8. Apparent gastric antral wall thickening could be due to underdistention. Correlate with symptoms of gastritis. 9. Grossly similar proximal small bowel dilatation without focal transition point. Favor mild adynamic ileus. 10. Uterine fibroids. Electronically Signed   By: Abigail Miyamoto M.D.   On: 06/08/2021 15:47   DG Chest 1 View  Result Date: 06/14/2021 CLINICAL DATA:  Shortness of breath EXAM: CHEST  1 VIEW COMPARISON:  05/03/2020 radiography and 06/08/2021 chest CT FINDINGS: Overall large lung volumes with streaky density behind the heart and at the medial right base. No edema, effusion, or pneumothorax. Likely normal heart size given technique. IMPRESSION: Atelectasis at the bases, also seen on chest CT 06/08/2021. No interval abnormality. Electronically Signed   By: Monte Fantasia M.D.   On: 06/12/2021 04:27   CT CHEST WO CONTRAST  Result Date: 06/08/2021 CLINICAL DATA:  Recurrent GI stromal tumor. Evaluate response to therapy. EXAM: CT CHEST, ABDOMEN AND PELVIS WITHOUT CONTRAST TECHNIQUE: Multidetector CT imaging of the chest, abdomen and pelvis was performed following the standard protocol without IV contrast. COMPARISON:  02/23/2021 FINDINGS: CT CHEST FINDINGS Cardiovascular: Limitations secondary to lack of IV contrast. Advanced aortic and branch vessel atherosclerosis. Tortuous thoracic aorta. Moderate cardiomegaly with trace pericardial fluid. Three vessel coronary artery calcification. Probable LAD  stent. Mediastinum/Nodes: No mediastinal or definite hilar adenopathy, given limitations of unenhanced CT. The esophagus is moderately dilated and contrast filled throughout. Lungs/Pleura: Left-sided pleural thickening and trace fluid are felt to be similar. Chronic material within the left greater than right endobronchial tree. Left greater than right lower lobe areas of consolidation are similar. A pleural-based anterior left  lower lobe 4 mm nodule on 83/3 is unchanged. Musculoskeletal: Osteopenia.  Remote bilateral rib fractures. CT ABDOMEN PELVIS FINDINGS Hepatobiliary: High left hepatic lobe cyst. Gallstones at up to 7 mm. The common duct is difficult to delineate, felt to measure on the order of 11 mm on 62/2. Distal common duct stone of 11 mm on 64/2 has enlarged since the prior exam, where it measured 7 mm. Pancreas: Not well evaluated secondary to lack of IV contrast. No gross abnormality identified. Spleen: Normal in size, without focal abnormality. Adrenals/Urinary Tract: Bilateral adrenal thickening. Left greater than right renal cortical thinning. Low-density left renal lesions are likely cysts. No hydronephrosis. Subtle pericystic edema including on 100/2. Stomach/Bowel: Surgical sutures about the gastric body. Apparent gastric antral wall thickening may be partially due to underdistention on 63/2. Scattered colonic diverticula. Proximal small bowel loops measure up to 3.9 cm today versus 3.7 cm on the prior. No focal transition point identified. Vascular/Lymphatic: Aortic atherosclerosis. No abdominopelvic adenopathy. Reproductive: Small calcified uterine fibroids.  No adnexal mass. Other: No significant free fluid.  Pelvic floor laxity. The low-density mass positioned posterior to the stomach is suboptimally evaluated secondary to noncontrast technique. Difficult to differentiate from adjacent pancreas. Felt to measure on the order of 7.2 x 5.4 cm on 56/2 . (Previously 8.2 x 5.5 cm.) Anasarca.  Fat  containing ventral pelvic wall hernia. Musculoskeletal: Osteopenia.  Trace L3-4 and L4-5 anterolisthesis. IMPRESSION: 1. The dominant low-density mass positioned adjacent the posterior stomach is suboptimally evaluated secondary to lack of IV contrast. Felt to be similar to slightly decreased in size, given this limitation. 2. No new sites of disease identified. 3. Esophageal dilatation with contrast level throughout. Most consistent with dysmotility. 4. Chronic bibasilar airspace disease with material in the endobronchial tree, highly suspicious for chronic aspiration-given appearance of the esophagus. 5. Enlargement of a distal common duct stone with persistent mild biliary duct dilatation. Concurrent cholelithiasis. 6. Pericystic edema for which cystitis is suspected. 7. New anasarca.  Correlate with fluid overload. 8. Apparent gastric antral wall thickening could be due to underdistention. Correlate with symptoms of gastritis. 9. Grossly similar proximal small bowel dilatation without focal transition point. Favor mild adynamic ileus. 10. Uterine fibroids. Electronically Signed   By: Abigail Miyamoto M.D.   On: 06/08/2021 15:47   NM Pulmonary Perfusion  Result Date: 06/17/2021 CLINICAL DATA:  Chest pain, shortness of breath, emphysema, history of blood clots EXAM: NUCLEAR MEDICINE PERFUSION LUNG SCAN TECHNIQUE: Perfusion images were obtained in multiple projections after intravenous injection of radiopharmaceutical. Ventilation scans intentionally deferred if perfusion scan and chest x-ray adequate for interpretation during COVID 19 epidemic. RADIOPHARMACEUTICALS:  4.4 mCi Tc-31mMAA IV COMPARISON:  Same day chest radiograph FINDINGS: Normal, homogeneous perfusion of the lungs bilaterally. No suspicious filling defects. Cardiomegaly. IMPRESSION: 1. Very low probability for pulmonary embolism by modified perfusion only PIOPED criteria (PE absent). 2.  Cardiomegaly. Electronically Signed   By: AEddie CandleM.D.    On: 06/12/2021 14:28   [place]Abdomen Complete  Result Date: 06/28/2021 CLINICAL DATA:  Sepsis, eval common bile duct EXAM: ABDOMEN ULTRASOUND COMPLETE COMPARISON:  CT abdomen pelvis 06/08/2021 FINDINGS: Gallbladder: Nondilated gallbladder. There is intraluminal sludge and small stones, largest stone measuring 6 mm. Minimal wall thickening. Minimal pericholecystic fluid. Common bile duct: Diameter: 13 mm Liver: Low parenchymal echogenicity. There is intrahepatic biliary ductal dilation. Portal vein is patent on color Doppler imaging with normal direction of blood flow towards the liver. IVC: Intrahepatic portion of visualized. Pancreas: Not  visualized. Spleen: Not visualized. Right Kidney: Length: 10.3 cm.  No hydronephrosis. Left Kidney: Length: Not well visualized. Abdominal aorta: Not visualized due to bowel gas. Other findings: None. IMPRESSION: Intra- and extrahepatic biliary ductal dilation with common bile duct measuring up to 13 mm. Correlate with LFTs and recommend MRCP for further evaluation. Intraluminal sludge and small stones within a nondilated gallbladder. Low hepatic parenchyma echogenicity which can be seen in hepatitis. These results will be called to the ordering clinician or representative by the Radiologist Assistant, and communication documented in the PACS or [employer]. Electronically Signed   By: Maurine Simmering   On: 06/28/2021 12:45   DG CHEST PORT 1 VIEW  Result Date: 06/30/2021 CLINICAL DATA:  Hypoxia. EXAM: PORTABLE CHEST 1 VIEW COMPARISON:  06/29/2021. FINDINGS: Left IJ line noted in stable position. Stable cardiomegaly. Persistent dense left base atelectasis. Persistent bilateral pulmonary infiltrates/edema. Persistent bilateral pleural effusions, left side greater than right. No pneumothorax. IMPRESSION: 1.  Left IJ line stable position. 2.  Stable cardiomegaly. 3. Persistent dense left base atelectasis. Persistent bilateral pulmonary infiltrates/edema. Persistent bilateral  pleural effusions, left side greater than right. Electronically Signed   By: Marcello Moores  Register   On: 06/30/2021 08:35   DG CHEST PORT 1 VIEW  Result Date: 06/29/2021 CLINICAL DATA:  Central line placement. EXAM: PORTABLE CHEST 1 VIEW COMPARISON:  06/28/2021. FINDINGS: Patient is rotated to the left. Left IJ line noted with tip over the upper right atrium. Cardiomegaly. No pulmonary venous congestion. Progressive left base atelectasis and consolidation. Mild right base infiltrate cannot be excluded. Small bilateral pleural effusions, left side greater than right. No pneumothorax. IMPRESSION: 1. Left IJ line noted with tip over the upper right atrium. Cardiomegaly. No pulmonary venous congestion. 2. Progressive left base atelectasis and consolidation. Mild right base infiltrate cannot be excluded. Small bilateral pleural effusions, left side greater than right. Electronically Signed   By: Marcello Moores  Register   On: 06/29/2021 05:59   DG CHEST PORT 1 VIEW  Result Date: 06/28/2021 CLINICAL DATA:  Abnormal respiration EXAM: PORTABLE CHEST 1 VIEW COMPARISON:  06/29/2021, CT 06/08/2021 FINDINGS: Persistent streaky and hazy opacities in the lung bases more coalescent density in the retrocardiac space. No pneumothorax or discernible pleural effusion. No focal consolidative process. Pulmonary vascularity remains normally distributed. Stable cardiomediastinal contours with a calcified aorta. The osseous structures appear diffusely demineralized which may limit detection of small or nondisplaced fractures. No acute osseous abnormality or suspicious osseous lesion. Degenerative changes are present in the imaged spine and shoulders. No worrisome soft tissue abnormality of the chest wall. Telemetry leads overlie the chest. IMPRESSION: Streaky and hazy opacities in the bases favoring atelectasis. More coalescent density in the left lung base, likely further volume loss or early airspace disease. Aortic Atherosclerosis  (ICD10-I70.0). Electronically Signed   By: Lovena Le M.D.   On: 06/28/2021 03:09   DG Abd Portable 1V  Result Date: 2021-07-15 CLINICAL DATA:  Feeding tube placement EXAM: PORTABLE ABDOMEN - 1 VIEW COMPARISON:  CT 06/28/2021 FINDINGS: Limited radiograph of the lower chest and upper abdomen was obtained for the purposes of enteric tube localization. Enteric tube is seen coursing below the diaphragm with distal tip and side port terminating within the expected location of the gastric body. A cholecystostomy tube is seen. IMPRESSION: Enteric tube tip and side port project within the expected location of the gastric body. Electronically Signed   By: Davina Poke D.O.   On: 07-15-21 15:25   IR PERCUTANEOUS TRANSHEPATIC CHOLANGIOGRAM  Result Date: 06/29/2021 INDICATION: 87 year old female with COVID and bacterial sepsis with a known chronic choledocholithiasis but concern for new cholangitis. She is currently too ill for ERCP and so interventional radiology is consulted for attempted percutaneous transhepatic cholangiogram with placement of a biliary drain. EXAM: 1. Percutaneous transhepatic cholangiogram 2. Percutaneous cholecystostomy tube placement MEDICATIONS: In patient currently receiving intravenous antibiotics ANESTHESIA/SEDATION: Moderate (conscious) sedation was employed during this procedure. A total of Versed 4.5 mg and Fentanyl 150 mcg was administered intravenously. Moderate Sedation Time: 130 minutes. The patient's level of consciousness and vital signs were monitored continuously by radiology nursing throughout the procedure under my direct supervision. FLUOROSCOPY TIME:  Fluoroscopy Time: 71 minutes 12 seconds (1694 mGy). COMPLICATIONS: None immediate. PROCEDURE: Informed written consent was obtained from the patient after a thorough discussion of the procedural risks, benefits and alternatives. All questions were addressed. Maximal Sterile Barrier Technique was utilized including caps,  mask, sterile gowns, sterile gloves, sterile drape, hand hygiene and skin antiseptic. A timeout was performed prior to the initiation of the procedure. The right upper quadrant was initially interrogated with ultrasound. The liver is small and relatively high in position. Additionally, colon obscures nearly the entirety of the left hepatic lobe. A suitable inter costal entry site in the right anterior axillary line was selected. Local anesthesia was attained by infiltration with 1% lidocaine. A 21 gauge needle was then passed into the liver and contrast gently injected while the needle was retracted. Ultimately, there was successful opacification of a central bile ducts. Contrast was injected to perform a percutaneous transhepatic cholangiogram. Unfortunately, due to deep breathing with use of accessory muscles and significant excursion of the chest, the needle was displaced from the bile duct resulting in irregular contrast deposition in the hepatic parenchyma partially obscuring the central ducts. Therefore, additional ultrasound was performed. There is a small window into the central aspect of the segment 4 ducts. Local anesthesia was again obtained with 1% lidocaine. Under real-time ultrasound guidance, a 22 gauge Chiba needle was carefully advanced into 1 of the more central segment 4 bile ducts. This allowed for a more thorough percutaneous transhepatic cholangiogram. There is marked dilation of the common bile duct with a large filling defect in the distal common bile duct consistent with the known choledocholithiasis. Due to the large size of the common bile duct, a very large volume of contrast had to be injected before the peripheral ducts could be opacified. There is fairly significant central intrahepatic biliary ductal dilatation, but very minimal dilation of the more peripheral bile ducts. A suitable bile duct in the posterior right lower lobe was identified for percutaneous access. Using a third 22  gauge needle, extensive attempts were made to puncture this duct via traditional techniques. However, despite visibly puncturing the duct on numerous occasions, a wire could not be advanced successfully into the biliary tree. This was due in part to extreme challenges with patient breathing, sedation, agitation and movement. Ultimately, after over 70 minutes of fluoro time further attempts at obtaining percutaneous biliary access were abandoned. In an effort to provide some drainage of the infected bile the decision was made to proceed with placement of a percutaneous transhepatic cholecystostomy tube. The ultrasound was re-prepped and a suitable skin entry site selected for access of the gallbladder lumen. Local anesthesia was again attained by infiltration with 1% lidocaine. Under real-time ultrasound guidance, a 22 gauge Chiba needle was advanced into the gallbladder lumen. The 0.018 wire was advanced in the gallbladder lumen. A small  dermatotomy was made at the needle entry site. The needle was removed and exchanged for the Accustick transitional dilator. The Accustick transitional dilator was advanced to the gallbladder lumen. The 018 wire was then exchanged for a 0.035 wire. The soft tissue tract was dilated to 10 [place]. A Cook 10.2 [place] all-purpose drain was then advanced over the wire and formed in the gallbladder lumen. Aspiration yields a copious volume of bile. Aspiration of the gallbladder lumen does result in some decompression of the biliary tree. The gallbladder drain was gently flushed with saline and connected to gravity bag drainage before being secured to the skin with 0 Prolene suture. IMPRESSION: 1. Successful percutaneous transhepatic cholangiogram demonstrating choledocholithiasis and marked dilation of the common bile duct, moderate dilation of the central intrahepatic ducts and minimal dilation of the peripheral bile ducts. 2. Despite over 70 minutes of fluoroscopy time, ultimately  unsuccessful attempted placement of a percutaneous biliary drain due to challenges with ability to sedate patient, patient agitation, significant respiratory motion and patient movement. 3. Ultimately, a percutaneous transhepatic cholecystostomy tube was placed in an effort to decompress the biliary system. PLAN: Patient would benefit most from ERCP which would provide complete biliary drainage as well as treat the underlying problem. If percutaneous drainage of the gallbladder is sufficient to allow some clinical improvement for the patient in order to allow ERCP to be performed, this would be the best next step. However, if patient fails to improve clinically, a repeat attempt at percutaneous biliary drain placement could be performed. This would require assistance of anesthesiology with either propofol deep sedation, or general anesthesia to allow for successful drain placement in this very challenging patient. Electronically Signed   By: Jacqulynn Cadet M.D.   On: 06/29/2021 07:15   ECHOCARDIOGRAM LIMITED  Result Date: 06/28/2021    ECHOCARDIOGRAM LIMITED REPORT   Patient Name:   Monique Lin Date of Exam: 06/28/2021 Medical Rec #:  [id number]    Height:       63.0 in Accession #:    [id number]   Weight:       189.4 lb Date of Birth:  1927/08/21     BSA:          1.889 m Patient Age:    69 years     BP:           104/44 mmHg Patient Gender: F            HR:           80 bpm. Exam Location:  Inpatient Procedure: Limited Echo, Cardiac Doppler and Color Doppler Indications:    Other abnormalities of the heart R00.8  History:        Patient has prior history of Echocardiogram examinations, most                 recent 01/13/2020. Risk Factors:Hypertension and Non-Smoker.  Sonographer:    Vickie Epley RDCS Referring Phys: [id number] [place] MARSHALL  Sonographer Comments: Covid positive. IMPRESSIONS  1. The left ventricle has normal function. The left ventricle has no regional wall motion abnormalities. There is mild  concentric left ventricular hypertrophy. Left ventricular diastolic parameters are consistent with Grade II diastolic dysfunction (pseudonormalization).  2. The right ventricular size is normal. There is mildly elevated pulmonary artery systolic pressure.  3. The mitral valve is normal in structure. Trivial mitral valve regurgitation.  4. The aortic valve is tricuspid. Aortic valve regurgitation is not visualized. Mild aortic valve sclerosis is present, with  no evidence of aortic valve stenosis.  5. The inferior vena cava is normal in size with greater than 50% respiratory variability, suggesting right atrial pressure of 3 mmHg. FINDINGS  Left Ventricle: The left ventricle has normal function. The left ventricle has no regional wall motion abnormalities. The left ventricular internal cavity size was normal in size. There is mild concentric left ventricular hypertrophy. Left ventricular diastolic parameters are consistent with Grade II diastolic dysfunction (pseudonormalization). Right Ventricle: The right ventricular size is normal. No increase in right ventricular wall thickness. There is mildly elevated pulmonary artery systolic pressure. The tricuspid regurgitant velocity is 3.24 m/s, and with an assumed right atrial pressure  of 3 mmHg, the estimated right ventricular systolic pressure is 83.3 mmHg. Left Atrium: Left atrial size was normal in size. Right Atrium: Right atrial size was normal in size. Pericardium: There is no evidence of pericardial effusion. Mitral Valve: The mitral valve is normal in structure. Trivial mitral valve regurgitation. Tricuspid Valve: The tricuspid valve is normal in structure. Tricuspid valve regurgitation is mild. Aortic Valve: The aortic valve is tricuspid. Aortic valve regurgitation is not visualized. Mild aortic valve sclerosis is present, with no evidence of aortic valve stenosis. Pulmonic Valve: The pulmonic valve was normal in structure. Pulmonic valve regurgitation is not  visualized. Aorta: The aortic root is normal in size and structure. Venous: The inferior vena cava is normal in size with greater than 50% respiratory variability, suggesting right atrial pressure of 3 mmHg. LEFT VENTRICLE PLAX 2D LVIDd:         4.40 cm     Diastology LVIDs:         2.80 cm     LV e' medial:    7.18 cm/s LV PW:         0.70 cm     LV E/e' medial:  16.0 LV IVS:        0.70 cm     LV e' lateral:   8.27 cm/s LVOT diam:     1.70 cm     LV E/e' lateral: 13.9 LV SV:         53 LV SV Index:   28 LVOT Area:     2.27 cm  LV Volumes (MOD) LV vol d, MOD A2C: 77.8 ml LV vol d, MOD A4C: 70.0 ml LV vol s, MOD A2C: 32.5 ml LV vol s, MOD A4C: 34.5 ml LV SV MOD A2C:     45.3 ml LV SV MOD A4C:     70.0 ml LV SV MOD BP:      41.2 ml RIGHT VENTRICLE RV S prime:     12.70 cm/s TAPSE (M-mode): 2.2 cm LEFT ATRIUM         Index LA diam:    3.60 cm 1.91 cm/m  AORTIC VALVE LVOT Vmax:   106.00 cm/s LVOT Vmean:  75.400 cm/s LVOT VTI:    0.232 m  AORTA Ao Root diam: 3.10 cm MITRAL VALVE                TRICUSPID VALVE MV Area (PHT): 3.79 cm     TR Peak grad:   42.0 mmHg MV Decel Time: 200 msec     TR Vmax:        324.00 cm/s MV E velocity: 115.00 cm/s MV A velocity: 88.10 cm/s   SHUNTS MV E/A ratio:  1.31         Systemic VTI:  0.23 m  Systemic Diam: 1.70 cm [name] MD Electronically signed by [name] MD Signature Date/Time: 06/28/2021/3:26:32 PM    Final     Microbiology Recent Results (from the past 240 hour(s))  Resp Panel by RT-PCR (Flu A&B, Covid) Nasopharyngeal Swab     Status: Abnormal   Collection Time: 07/02/2021  8:36 AM   Specimen: Nasopharyngeal Swab; Nasopharyngeal(NP) swabs in vial transport medium  Result Value Ref Range Status   SARS Coronavirus 2 by RT PCR POSITIVE (A) NEGATIVE Final    Comment: RESULT CALLED TO, READ BACK BY AND VERIFIED WITH: RN ZOE CAGLE [id number] AT 1011 BY CM (NOTE) SARS-CoV-2 target nucleic acids are DETECTED.  The SARS-CoV-2 RNA is  generally detectable in upper respiratory specimens during the acute phase of infection. Positive results are indicative of the presence of the identified virus, but do not rule out bacterial infection or co-infection with other pathogens not detected by the test. Clinical correlation with patient history and other diagnostic information is necessary to determine patient infection status. The expected result is Negative.  Fact Sheet for Patients: EntrepreneurPulse.com.au  Fact Sheet for Healthcare Providers: IncredibleEmployment.be  This test is not yet approved or cleared by the [place] FDA and  has been authorized for detection and/or diagnosis of SARS-CoV-2 by FDA under an Emergency Use Authorization (EUA).  This EUA will remain in effect (meaning this test can be  used) for the duration of  the COVID-19 declaration under Section 564(b)(1) of the Act, 21 U.S.C. section 360bbb-3(b)(1), unless the authorization is terminated or revoked sooner.     Influenza A by PCR NEGATIVE NEGATIVE Final   Influenza B by PCR NEGATIVE NEGATIVE Final    Comment: (NOTE) The Xpert Xpress SARS-CoV-2/FLU/RSV plus assay is intended as an aid in the diagnosis of influenza from Nasopharyngeal swab specimens and should not be used as a sole basis for treatment. Nasal washings and aspirates are unacceptable for Xpert Xpress SARS-CoV-2/FLU/RSV testing.  Fact Sheet for Patients: EntrepreneurPulse.com.au  Fact Sheet for Healthcare Providers: IncredibleEmployment.be  This test is not yet approved or cleared by the [place] FDA and has been authorized for detection and/or diagnosis of SARS-CoV-2 by FDA under an Emergency Use Authorization (EUA). This EUA will remain in effect (meaning this test can be used) for the duration of the COVID-19 declaration under Section 564(b)(1) of the Act, 21 U.S.C. section 360bbb-3(b)(1),  unless the authorization is terminated or revoked.  Performed at [facility], [place] [address], [place], [place] [address]   Blood Culture (routine x 2)     Status: Abnormal   Collection Time: 06/26/2021  8:36 AM   Specimen: BLOOD LEFT ARM  Result Value Ref Range Status   Specimen Description BLOOD LEFT ARM  Final   Special Requests   Final    BOTTLES DRAWN AEROBIC AND ANAEROBIC Blood Culture adequate volume   Culture  Setup Time   Final    GRAM NEGATIVE RODS IN BOTH AEROBIC AND ANAEROBIC BOTTLES Organism ID to follow CRITICAL RESULT CALLED TO, READ BACK BY AND VERIFIED WITH: PHARMD LAURA SEAY BY MESSAN H AT 2314 ON 7 25 2022 Performed at [facility], [place] [address], [place], [place] [address]    Culture (A)  Final    KLEBSIELLA PNEUMONIAE ESCHERICHIA COLI SUSCEPTIBILITIES PERFORMED ON PREVIOUS CULTURE WITHIN THE LAST 5 DAYS. ESCHERICHIA COLI    Report Status 08-Jul-2021 FINAL  Final   Organism ID, Bacteria KLEBSIELLA PNEUMONIAE  Final  Susceptibility   Klebsiella pneumoniae - MIC*    AMPICILLIN >=32 RESISTANT Resistant     CEFAZOLIN <=4 SENSITIVE Sensitive     CEFEPIME <=0.12 SENSITIVE Sensitive     CEFTAZIDIME <=1 SENSITIVE Sensitive     CEFTRIAXONE <=0.25 SENSITIVE Sensitive     CIPROFLOXACIN <=0.25 SENSITIVE Sensitive     GENTAMICIN <=1 SENSITIVE Sensitive     IMIPENEM <=0.25 SENSITIVE Sensitive     TRIMETH/SULFA <=20 SENSITIVE Sensitive     AMPICILLIN/SULBACTAM 4 SENSITIVE Sensitive     PIP/TAZO <=4 SENSITIVE Sensitive     * KLEBSIELLA PNEUMONIAE  Blood Culture ID Panel (Reflexed)     Status: Abnormal   Collection Time: 06/05/2021  8:36 AM  Result Value Ref Range Status   Enterococcus faecalis NOT DETECTED NOT DETECTED Final   Enterococcus Faecium NOT DETECTED NOT DETECTED Final   Listeria monocytogenes NOT DETECTED NOT DETECTED Final   Staphylococcus species NOT DETECTED NOT DETECTED Final   Staphylococcus aureus (BCID) NOT DETECTED NOT DETECTED Final    Staphylococcus epidermidis NOT DETECTED NOT DETECTED Final   Staphylococcus lugdunensis NOT DETECTED NOT DETECTED Final   Streptococcus species NOT DETECTED NOT DETECTED Final   Streptococcus agalactiae NOT DETECTED NOT DETECTED Final   Streptococcus pneumoniae NOT DETECTED NOT DETECTED Final   Streptococcus pyogenes NOT DETECTED NOT DETECTED Final   A.calcoaceticus-baumannii NOT DETECTED NOT DETECTED Final   Bacteroides fragilis NOT DETECTED NOT DETECTED Final   Enterobacterales DETECTED (A) NOT DETECTED Final    Comment: CRITICAL RESULT CALLED TO, READ BACK BY AND VERIFIED WITH: PHARMD LAURA SEAY BY MESSAN H AT 2158 ON 7 25 2022    Enterobacter cloacae complex NOT DETECTED NOT DETECTED Final   Escherichia coli DETECTED (A) NOT DETECTED Final    Comment: CRITICAL RESULT CALLED TO, READ BACK BY AND VERIFIED WITH: PHARMD LAURA SEAY BY MESSAN H AT 7276 ON 7 25 2022    Klebsiella aerogenes NOT DETECTED NOT DETECTED Final   Klebsiella oxytoca NOT DETECTED NOT DETECTED Final   Klebsiella pneumoniae DETECTED (A) NOT DETECTED Final    Comment: CRITICAL RESULT CALLED TO, READ BACK BY AND VERIFIED WITH: PHARMD LAURA SEAY BY MESSAN H AT 1848 ON 7 25 2022    Proteus species NOT DETECTED NOT DETECTED Final   Salmonella species NOT DETECTED NOT DETECTED Final   Serratia marcescens NOT DETECTED NOT DETECTED Final   Haemophilus influenzae NOT DETECTED NOT DETECTED Final   Neisseria meningitidis NOT DETECTED NOT DETECTED Final   Pseudomonas aeruginosa NOT DETECTED NOT DETECTED Final   Stenotrophomonas maltophilia NOT DETECTED NOT DETECTED Final   Candida albicans NOT DETECTED NOT DETECTED Final   Candida auris NOT DETECTED NOT DETECTED Final   Candida glabrata NOT DETECTED NOT DETECTED Final   Candida krusei NOT DETECTED NOT DETECTED Final   Candida parapsilosis NOT DETECTED NOT DETECTED Final   Candida tropicalis NOT DETECTED NOT DETECTED Final   Cryptococcus neoformans/gattii NOT DETECTED  NOT DETECTED Final   CTX-M ESBL NOT DETECTED NOT DETECTED Final   Carbapenem resistance IMP NOT DETECTED NOT DETECTED Final   Carbapenem resistance KPC NOT DETECTED NOT DETECTED Final   Carbapenem resistance NDM NOT DETECTED NOT DETECTED Final   Carbapenem resist OXA 48 LIKE NOT DETECTED NOT DETECTED Final   Carbapenem resistance VIM NOT DETECTED NOT DETECTED Final    Comment: Performed at [facility], [address], [place], [place] [address]  Blood Culture (routine x 2)     Status: Abnormal   Collection  Time: 06/07/2021  9:15 AM   Specimen: BLOOD  Result Value Ref Range Status   Specimen Description BLOOD RIGHT ANTECUBITAL  Final   Special Requests   Final    BOTTLES DRAWN AEROBIC AND ANAEROBIC Blood Culture adequate volume   Culture  Setup Time   Final    GRAM NEGATIVE RODS IN BOTH AEROBIC AND ANAEROBIC BOTTLES IDENTIFICATION TO FOLLOW CRITICAL VALUE NOTED.  VALUE IS CONSISTENT WITH PREVIOUSLY REPORTED AND CALLED VALUE.    Culture (A)  Final    KLEBSIELLA PNEUMONIAE ESCHERICHIA COLI SUSCEPTIBILITIES PERFORMED ON PREVIOUS CULTURE WITHIN THE LAST 5 DAYS. KLEBSIELLA PNEUMONIAE    Report Status July 02, 2021 FINAL  Final   Organism ID, Bacteria ESCHERICHIA COLI  Final      Susceptibility   Escherichia coli - MIC*    AMPICILLIN >=32 RESISTANT Resistant     CEFAZOLIN <=4 SENSITIVE Sensitive     CEFEPIME <=0.12 SENSITIVE Sensitive     CEFTAZIDIME <=1 SENSITIVE Sensitive     CEFTRIAXONE <=0.25 SENSITIVE Sensitive     CIPROFLOXACIN <=0.25 SENSITIVE Sensitive     GENTAMICIN <=1 SENSITIVE Sensitive     IMIPENEM <=0.25 SENSITIVE Sensitive     TRIMETH/SULFA <=20 SENSITIVE Sensitive     AMPICILLIN/SULBACTAM 16 INTERMEDIATE Intermediate     PIP/TAZO <=4 SENSITIVE Sensitive     * ESCHERICHIA COLI  MRSA Next Gen by PCR, Nasal     Status: None   Collection Time: 06/19/2021  8:34 PM   Specimen: Nasal Mucosa; Nasal Swab  Result Value Ref Range Status   MRSA by PCR Next Gen NOT DETECTED  NOT DETECTED Final    Comment: (NOTE) The GeneXpert MRSA Assay (FDA approved for NASAL specimens only), is one component of a comprehensive MRSA colonization surveillance program. It is not intended to diagnose MRSA infection nor to guide or monitor treatment for MRSA infections. Test performance is not FDA approved in patients less than 65 years old. Performed at [facility], [place] [address] [place], [place] [address]   Culture, blood (routine x 2)     Status: None (Preliminary result)   Collection Time: 06/29/21  8:06 AM   Specimen: BLOOD RIGHT HAND  Result Value Ref Range Status   Specimen Description BLOOD RIGHT HAND  Final   Special Requests   Final    BOTTLES DRAWN AEROBIC ONLY Blood Culture results may not be optimal due to an inadequate volume of blood received in culture bottles   Culture   Final    NO GROWTH 1 DAY Performed at [facility], [place] [address], [place], [place] [address]    Report Status PENDING  Incomplete  Culture, blood (routine x 2)     Status: None (Preliminary result)   Collection Time: 06/29/21  8:06 AM   Specimen: BLOOD RIGHT HAND  Result Value Ref Range Status   Specimen Description BLOOD RIGHT HAND  Final   Special Requests   Final    BOTTLES DRAWN AEROBIC AND ANAEROBIC Blood Culture results may not be optimal due to an inadequate volume of blood received in culture bottles   Culture   Final    NO GROWTH 1 DAY Performed at [facility], [place] [address], [place],  [address]    Report Status PENDING  Incomplete    Lab Basic Metabolic Panel: Recent Labs  Lab 06/28/21 0410 06/29/21 0259 06/29/21 1809 06/30/21 0507 06/30/21 0912 06/30/21 2300 2021/07/02 0500  NA 141 138 138 138 142 139 139  K 4.1 4.3 3.8 3.9 3.7 4.3 4.5  CL 114* 113* 115* 114*  --  114* 113*  CO2 20* 16* 17* 18*  --  19* 20*  GLUCOSE 90 176* 108* 104*  --  139* 133*  BUN 25* 32* 34* 36*  --  42* 44*  CREATININE 2.19* 2.55* 2.65* 2.84*  --  3.14*  3.30*  CALCIUM 8.1* 7.4* 7.4* 7.8*  --  8.2* 8.4*  MG 1.4* 2.1  --  2.2  --  2.1 2.3  PHOS 5.0* 4.1  --  4.7*  --   --  6.2*   Liver Function Tests: Recent Labs  Lab 06/28/21 0410 06/29/21 0259 06/30/21 0507 07/19/21 0500  AST 221* 147* 97* 60*  ALT 109* 66* 65* 56*  ALKPHOS 354* 325* 264* 213*  BILITOT 1.9* 2.2* 1.8* 1.6*  PROT 4.5* 3.5* 4.3* 4.4*  ALBUMIN 2.1* 1.7* 1.9* 1.9*   Recent Labs  Lab 06/28/21 0410 July 19, 2021 0908  LIPASE 26 46   No results for input(s): AMMONIA in the last 168 hours. CBC: Recent Labs  Lab 06/09/2021 0334 06/28/21 0410 06/29/21 0259 06/30/21 0507 06/30/21 0912 2021/07/19 0500  WBC 3.7* 18.5* 16.9* 30.0*  --  33.1*  NEUTROABS  --  17.2* 14.5* 27.9*  --  27.1*  HGB 9.8* 9.3* 8.5* 8.6* 8.5* 8.7*  HCT 29.0* 27.2* 24.7* 24.5* 25.0* 26.5*  MCV 96.3 94.1 92.2 91.8  --  96.7  PLT PLATELET CLUMPS NOTED ON SMEAR, UNABLE TO ESTIMATE 81* 84* 85*  --  88*   Cardiac Enzymes: Recent Labs  Lab 06/26/2021 2114  CKTOTAL 341*   Sepsis Labs: Recent Labs  Lab 06/06/2021 0902 06/28/21 0410 06/29/21 0259 06/29/21 0806 06/29/21 1046 06/30/21 0507 July 19, 2021 0500  PROCALCITON 4.54  --   --   --   --   --   --   WBC  --  18.5* 16.9*  --   --  30.0* 33.1*  LATICACIDVEN 1.6  --   --  3.6* 1.4  --   --     Procedures/Operations  7/26 PCT with choledocholithiasis, unable to place PBD,  Cholecystostomy tube placed, LIJ placed   Iona Beard 07-19-21, 4:36 PM

## 2021-07-04 NOTE — Progress Notes (Signed)
Pt with prolonged periods of apnea and multple pauses noted on the monitor. Dr. Lisabeth Devoid made aware and family notified of  pt condition. Family en route to hospital.

## 2021-07-04 NOTE — Progress Notes (Signed)
OT Cancellation Note  Patient Details Name: Monique Lin MRN: [id number] DOB: 1927-09-04   Cancelled Treatment:    Reason Eval/Treat Not Completed: Medical issues which prohibited therapy; patient with further decline in function/cognition. Not following commands at this time. Family considering comfort care. OT to sign off at this time. Please place new orders if patient becomes appropriate.   Gloris Manchester OTR/L Supplemental OT, Department of rehab services [phone]  Rien Marland R H. 07-23-21, 9:21 AM

## 2021-07-04 NOTE — Progress Notes (Signed)
PT Cancellation Note  Patient Details Name: Monique Lin MRN: [id number] DOB: Nov 26, 1927   Cancelled Treatment:    Reason Eval/Treat Not Completed: Medical issues which prohibited therapy (Pt has had decline.  Considering comfort care. Will sign off and MD please reorder if pt becomes appropriate.)   [name] 07/28/2021, 9:20 AM Emmitte Surgeon M,PT Acute Rehab Services [phone] (pager)

## 2021-07-04 NOTE — Telephone Encounter (Signed)
Dr Marin Olp aware that pt passed away in the hospital on 07/12/2021. Flowers ordered for pt's daughter [name]. dph

## 2021-07-04 NOTE — Progress Notes (Signed)
Daily Progress Note   Patient Name: Monique Lin       Date: 07-15-2021 DOB: 1927-06-19  Age: 87 y.o. MRN#: [id number] Attending Physician: Freddi Starr, MD Primary Care Physician: [name], MD Admit Date: 06/20/2021  Reason for Consultation/Follow-up: Establishing goals of care  Subjective: [name] for followup. She was in patient's room.  On my arrival to room RN informed me that patient was actively dying.  Stood by family and offered supportive presence.  Patient appeared comfortable- no symptom management needs.   Review of Systems  Unable to perform ROS: Acuity of condition   Length of Stay: 4  Current Medications: Scheduled Meds:   artificial tears   Left Eye QHS   vitamin C  500 mg Per Tube Daily   atorvastatin  20 mg Per Tube Daily   Chlorhexidine Gluconate Cloth  6 each Topical Daily   docusate sodium  100 mg Oral BID   feeding supplement (PROSource TF)  45 mL Per Tube Daily   heparin injection (subcutaneous)  5,000 Units Subcutaneous Q8H   hydrocortisone sodium succinate  50 mg Intravenous Q12H   levothyroxine  25 mcg Intravenous Daily   mouth rinse  15 mL Mouth Rinse BID   pantoprazole (PROTONIX) IV  40 mg Intravenous Q12H   polyvinyl alcohol  1 drop Left Eye TID   sodium chloride flush  10-40 mL Intracatheter Q12H   sodium chloride flush  3 mL Intravenous Q12H   sodium chloride flush  3 mL Intravenous Q12H   sodium chloride flush  5 mL Intracatheter Q12H   zinc sulfate  220 mg Per Tube Daily    Continuous Infusions:  sodium chloride     sodium chloride Stopped (06/28/21 0100)   amiodarone 30 mg/hr (07/15/21 1005)   cefTRIAXone (ROCEPHIN)  IV 2 g (06/30/21 1409)   feeding supplement (OSMOLITE 1.5 CAL)     fentaNYL infusion INTRAVENOUS      metronidazole 500 mg (07-15-21 0753)   norepinephrine (LEVOPHED) Adult infusion 37 mcg/min (07-15-2021 1018)   vasopressin 0.03 Units/min (2021-07-15 0955)    PRN Meds: sodium chloride, acetaminophen, albuterol, albuterol, artificial tears, bisacodyl, chlorpheniramine-HYDROcodone, fentaNYL (SUBLIMAZE) injection, guaiFENesin-dextromethorphan, haloperidol lactate, ondansetron **OR** ondansetron (ZOFRAN) IV, polyethylene glycol, sodium chloride flush, sodium chloride flush, sodium phosphate  Physical Exam Vitals and nursing note reviewed.  Constitutional:      Appearance: She is ill-appearing and toxic-appearing.     Comments: Actively dying  Cardiovascular:     Rate and Rhythm: Rhythm irregular.     Comments: Diffuse anasarca, in and out of asystole Pulmonary:     Comments: Respirations shallow, periods of apnea Skin:    Coloration: Skin is jaundiced.  Neurological:     Comments: unresponsive            Vital Signs: BP (!) 61/33   Pulse (!) 186   Temp (!) 97.5 F (36.4 C) (Axillary)   Resp (!) 6   Wt 85.9 kg   SpO2 98%   BMI 33.55 kg/m  SpO2: SpO2: 98 % O2 Device: O2 Device: Nasal Cannula O2 Flow Rate: O2 Flow Rate (L/min): 2 L/min  Intake/output summary:  Intake/Output Summary (Last 24 hours) at 07/27/21 1635 Last data filed at 2021/07/27 0923 Gross per 24 hour  Intake --  Output 140 ml  Net -140 ml   LBM: Last BM Date: 06/29/21 Baseline Weight: Weight: 83 kg Most recent weight: Weight: 85.9 kg       Palliative Assessment/Data: PPS: 10%      Patient Active Problem List   Diagnosis Date Noted   DNR (do not resuscitate) 06/29/2021   Acute hypoxemic respiratory failure due to COVID-19 (HCC) 06/26/2021   Stage 4 chronic kidney disease ([place]) 06/18/2021   Class 1 obesity due to excess calories with body mass index (BMI) of 32.0 to 32.9 in adult 06/09/2021   Acute hypoxemic respiratory failure (HCC) 06/05/2021   Hypertension 02/01/2021   Bell's palsy 02/01/2021    Facial droop    Gastrointestinal stromal tumor (GIST) associated with mutation in KIT gene ([place]) 03/10/2020   Pressure injury of skin 01/14/2020   Palliative care by specialist    Goals of care, counseling/discussion    Transaminitis    Acute gallstone pancreatitis 01/09/2020   Acute renal failure (ARF) () 01/09/2020   Essential hypertension 01/09/2020   Hypothyroidism 01/09/2020   Acute GI bleeding 12/11/2019   CAP (community acquired pneumonia)    Empyema (HCC)    Pleural effusion, left    Left lower lobe pneumonia 04/01/2018   Bronchitis     Palliative Care Assessment & Plan   Patient Profile: 87 y.o. female  with past medical history of GIST- stable currently on Gleevec last scan on 06/08/21 without progression, anemia (recently transfused at cancer center) admitted on 06/16/2021 with COVID+, septic shock- requiring IV pressors- bacteremia growing E. Coli and Klebsiella. Workup has revealed possible GI source of infection with increased biliary ductal dilation, cholelithiasis and sludge. Her Cr is trending up. Palliative consulted for [place].   Assessment/Recommendations/Plan  Actively dying- likely to transition within the next minutes, she appears comfortable- no symptom mgmt needs, family present and understanding  Code Status: DNR  Discharge Planning: Anticipated Hospital Death  Care plan was discussed with family and care team.  Thank you for allowing the Palliative Medicine Team to assist in the care of this patient.   Total time: 18 mins Greater than 50%  of this time was spent counseling and coordinating care related to the above assessment and plan.  Mariana Kaufman, AGNP-C Palliative Medicine   Please contact Palliative Medicine Team phone at [phone] for questions and concerns.

## 2021-07-04 NOTE — Progress Notes (Addendum)
NAME:  Monique Lin, MRN:  [id number], DOB:  [birth year], LOS: 4 ADMISSION DATE:  06/18/2021, CONSULTATION DATE:  7/25 REFERRING MD:  Dr. Lorin Mercy, CHIEF COMPLAINT:  Hypotension, COVID   History of Present Illness:  87 year old female with PMH as below, which is significant for gastrointestinal stromal tumor followed by Dr. Marin Olp on treatement with Gleevec, HTN, CKD, and hypothyroidism. She presented to [facility] ED 7/25 with complaints of malaise, body aches, and shortness of breath. Tested positive for COVID at home on the day of presentation. She is status post the initial series of vaccination in 2021. Upon arrival to the emergency department she was noted to be hypoxemic to the mid 80s on room air. She was placed on 2L nasal cannula and sats improved to the high 90s. COVID testing in the ED was confirmed positive. She was admitted to the hospitalists service. COVID labs were ordered including D. Dimer, which was positive. VQ scan was obtained with low probability of PE. As the day progressed she became hypotensive despite fluid bolus. PCCM consulted.   Pertinent  Medical History   has a past medical history of Anemia, Gastrointestinal stromal tumor (GIST) associated with mutation in KIT gene ([place]) (03/10/2020), Hypertension, and Hypothyroidism.   Significant Hospital Events: Including procedures, antibiotic start and stop dates in addition to other pertinent events   7/25 admit for COVID, hypotension 7/26 PCT with choledocholithiasis, unable to place PBD,  Cholecystostomy tube placed, LIJ placed  Interim History / Subjective:  Afib with RVR overnight, started on amiodarone infusion. Increasing pressor requirements. Ill appearing. Laying in bed maintaining O2 saturations with [place] in mouth for mouth breathing. Minimal vocal response to pain, no purposeful movements.  Objective   Blood pressure (!) 127/43, pulse 78, temperature 97.8 F (36.6 C), temperature source Axillary, resp. rate 13, weight  85.9 kg, SpO2 98 %.        Intake/Output Summary (Last 24 hours) at 07/04/2021 0740 Last data filed at 07-04-21 0973 Gross per 24 hour  Intake 273.98 ml  Output 240 ml  Net 33.98 ml    Filed Weights   06/09/2021 1200 07/02/2021 2029  Weight: 83 kg 85.9 kg   Examination: General: Obese elderly female, ill appearing, sleeping in bed HENT: [place]/AT, mucous membranes moist Lungs: Clear bilateral breath sounds but diminished, no rales or rhonchi, mouth breathing intermittently gasping, [place] in mouth Cardiovascular: regular rate and rhythm, no MRG Abdomen: Soft, active bowel sounds, bilious drainage from cholecystostomy  Extremities: No acute deformity. 2+ edema  Neuro: Sleeping difficult to arouse, minimal response to pain,  no purposeful movements, not following commands Resolved Hospital Problem list     Assessment & Plan:  Septic shock secondary to Klebsiella pneumoniae and E. Coli bacteremia  likely due to cholangitis  BCID with Enterobacterales, E. Coli ,and klebsiella. Cultures positive for ampicillin resistant klebsiella pneumoniae.  LFTs improving but worsening leukocytosis. Increasing pressor requirements - GI and IR consulted - s/p PTC with cholecystostomy - Poor candidate for ERCP  - Levophed as needed to maintain MAP >65 - continue vasopressin  - Continue ceftriaxone and metronidazole - Monitor LFTs  Acute hypoxemic respiratory failure COVID pneumonia - Supplemental O2 to keep SpO2 > 92% - Hydrocortisone 50 q12  - Remdesivir for total of 5 days last day 7/29  Acute encephalopathy Somnolent this morning, not following commands. Likely multifactorial from worsening sepsis, hypoxia, steroids, delirium, and pain. - Cortrak tube placement today - Fentanyl 25-50 q2 - Haldol 2 mg q6  PRN   AKI on CKD Hypomadnesemia No urine output with lasix yesterday. IN acute renal failure - Foley in place - monitor I/Os - monitor BMP, avoid nephrotoxins, renally dose medications  -  Discussed with daughters and would not want dialysis  - replete electrolytes PRN  Atrial fibrillation with RVR Afib with RVR overnight started on amiodarone infusion now in sinus rhythm.  - continue amiodarone - monitor on tele  Anemia: hemoglobin above baseline on admission - Epoetin alfa weekly, last dose 06/28/2021 - Trend hemoglobin  Gastrointestinal stromal tumor: followed by Dr. Marin Olp.  - On Gleevec, tumor stable by recent imaging.   Hypothyroid - Synthroid  Goals of care -palliative care consult -Family considering transitioning to comfort care but unable to make a decision, would not like to pursue dialysis or ERCP - Overall worsening renal failure, altered mental status, and septic shock, will continue to discuss transitioning to comfort care with famiy   Best Practice (right click and "Reselect all SmartList Selections" daily)  Diet/type: NPO plan for cortrak placement DVT prophylaxis: prophylactic heparin  GI prophylaxis: PPI Lines: N/A Foley:  N/A Code Status:  DNR Last date of multidisciplinary goals of care discussion [7/29 called and updated daughters this morning. Sherry and [place] understand severity of their mother's illness. Daughters have been discussion comfort care with pastor and continue to consider it, but currently are unable to come to a decision. Confirmed they would not want dialysis or ERCP.]  Labs   CBC: Recent Labs  Lab 06/18/2021 0334 06/28/21 0410 06/29/21 0259 06/30/21 0507 06/30/21 0912 2021-07-18 0500  WBC 3.7* 18.5* 16.9* 30.0*  --  33.1*  NEUTROABS  --  17.2* 14.5* 27.9*  --  27.1*  HGB 9.8* 9.3* 8.5* 8.6* 8.5* 8.7*  HCT 29.0* 27.2* 24.7* 24.5* 25.0* 26.5*  MCV 96.3 94.1 92.2 91.8  --  96.7  PLT PLATELET CLUMPS NOTED ON SMEAR, UNABLE TO ESTIMATE 81* 84* 85*  --  88*     Basic Metabolic Panel: Recent Labs  Lab 06/28/21 0410 06/29/21 0259 06/29/21 1809 06/30/21 0507 06/30/21 0912 06/30/21 2300 07/18/21 0500  NA 141 138  138 138 142 139 139  K 4.1 4.3 3.8 3.9 3.7 4.3 4.5  CL 114* 113* 115* 114*  --  114* 113*  CO2 20* 16* 17* 18*  --  19* 20*  GLUCOSE 90 176* 108* 104*  --  139* 133*  BUN 25* 32* 34* 36*  --  42* 44*  CREATININE 2.19* 2.55* 2.65* 2.84*  --  3.14* 3.30*  CALCIUM 8.1* 7.4* 7.4* 7.8*  --  8.2* 8.4*  MG 1.4* 2.1  --  2.2  --  2.1 2.3  PHOS 5.0* 4.1  --  4.7*  --   --  6.2*    GFR: Estimated Creatinine Clearance: 11.3 mL/min (A) (by C-G formula based on SCr of 3.3 mg/dL (H)). Recent Labs  Lab 07/02/2021 0902 06/28/21 0410 06/29/21 0259 06/29/21 0806 06/29/21 1046 06/30/21 0507 07/18/2021 0500  PROCALCITON 4.54  --   --   --   --   --   --   WBC  --  18.5* 16.9*  --   --  30.0* 33.1*  LATICACIDVEN 1.6  --   --  3.6* 1.4  --   --      Liver Function Tests: Recent Labs  Lab 06/28/21 0410 06/29/21 0259 06/30/21 0507 2021/07/18 0500  AST 221* 147* 97* 60*  ALT 109* 66* 65* 56*  ALKPHOS 354* 325*  264* 213*  BILITOT 1.9* 2.2* 1.8* 1.6*  PROT 4.5* 3.5* 4.3* 4.4*  ALBUMIN 2.1* 1.7* 1.9* 1.9*    Recent Labs  Lab 06/28/21 0410  LIPASE 26    No results for input(s): AMMONIA in the last 168 hours.  ABG    Component Value Date/Time   PHART 7.472 (H) 04/02/2018 0158   PCO2ART 23.8 (L) 04/02/2018 0158   PO2ART 191.0 (H) 04/02/2018 0158   HCO3 17.2 (L) 06/30/2021 0912   TCO2 18 (L) 06/30/2021 0912   ACIDBASEDEF 9.0 (H) 06/30/2021 0912   O2SAT 56.0 06/30/2021 0912      Coagulation Profile: Recent Labs  Lab 06/28/21 1525 06/30/21 1046  INR 1.8* 2.4*     Cardiac Enzymes: Recent Labs  Lab 06/10/2021 2114  CKTOTAL 341*     HbA1C: No results found for: HGBA1C  CBG: Recent Labs  Lab 06/30/21 1557 06/30/21 1940 06/30/21 2328 July 02, 2021 0351 2021/07/02 0737  GLUCAP 96 100* 114* 95 125*      Past Medical History:  She,  has a past medical history of Anemia, Gastrointestinal stromal tumor (GIST) associated with mutation in KIT gene ([place]) (03/10/2020),  Hypertension, and Hypothyroidism.   Surgical History:   Past Surgical History:  Procedure Laterality Date   BIOPSY  01/12/2020   Procedure: BIOPSY;  Surgeon: Ronald Lobo, MD;  Location: [place];  Service: Endoscopy;;   ESOPHAGOGASTRODUODENOSCOPY (EGD) WITH PROPOFOL N/A 01/12/2020   Procedure: ESOPHAGOGASTRODUODENOSCOPY (EGD) WITH PROPOFOL;  Surgeon: Ronald Lobo, MD;  Location: [place];  Service: Endoscopy;  Laterality: N/A;   ESOPHAGOGASTRODUODENOSCOPY (EGD) WITH PROPOFOL N/A 01/15/2020   Procedure: ESOPHAGOGASTRODUODENOSCOPY (EGD) WITH PROPOFOL;  Surgeon: Arta Silence, MD;  Location: [place];  Service: Endoscopy;  Laterality: N/A;   EUS N/A 01/15/2020   Procedure: UPPER ENDOSCOPIC ULTRASOUND (EUS) RADIAL;  Surgeon: Arta Silence, MD;  Location: [place];  Service: Endoscopy;  Laterality: N/A;   FINE NEEDLE ASPIRATION  01/15/2020   Procedure: FINE NEEDLE ASPIRATION (FNA) LINEAR;  Surgeon: Arta Silence, MD;  Location: [place];  Service: Endoscopy;;   IR CATHETER TUBE CHANGE  04/14/2018   IR PERCUTANEOUS TRANSHEPATIC CHOLANGIOGRAM  06/28/2021   IR THORACENTESIS ASP PLEURAL SPACE W/IMG GUIDE  04/05/2018   NASAL SINUS SURGERY     stomach tumor removal       Social History:   reports that she has never smoked. She has never used smokeless tobacco. She reports that she does not drink alcohol and does not use drugs.   Family History:  Her Family history is unknown by patient.   Allergies Allergies  Allergen Reactions   Codeine Other (See Comments)    Stomach cramps   Penicillins Other (See Comments)    "Bad stomach cramps" Has patient had a PCN reaction causing immediate rash, facial/tongue/throat swelling, SOB or lightheadedness with hypotension: Unk Has patient had a PCN reaction causing severe rash involving mucus membranes or skin necrosis: Unk Has patient had a PCN reaction that required hospitalization: Unk Has patient had a PCN reaction occurring  within the last 10 years: No If all of the above answers are "NO", then may proceed with Cephalosporin use.       Home Medications  Prior to Admission medications   Medication Sig Start Date End Date Taking? Authorizing Provider  acetaminophen (TYLENOL) 650 MG CR tablet Take 650 mg by mouth every 8 (eight) hours as needed for pain.   Yes [provider]  albuterol (PROVENTIL) (2.5 MG/3ML) 0.083% nebulizer solution Take 2.5 mg by  nebulization in the morning, at noon, in the evening, and at bedtime.   Yes [provider]  albuterol (VENTOLIN HFA) 108 (90 Base) MCG/ACT inhaler Inhale 1 puff into the lungs every 6 (six) hours as needed for wheezing or shortness of breath.   Yes [provider]  ALLEGRA ALLERGY 180 MG tablet Take 180 mg by mouth in the morning.   Yes [provider]  alum & mag hydroxide-simeth (MAALOX/MYLANTA) 200-200-20 MG/5ML suspension Take 30 mLs by mouth every 6 (six) hours as needed for indigestion, heartburn or flatulence. 12/22/19  Yes [name], MD  artificial tears (LACRILUBE) OINT ophthalmic ointment Place into the left eye at bedtime. 02/01/21  Yes Elodia Florence., MD  atorvastatin (LIPITOR) 20 MG tablet Take 20 mg by mouth daily.   Yes [provider]  bisacodyl (DULCOLAX) 5 MG EC tablet Take 1 tablet (5 mg total) by mouth daily as needed for moderate constipation. 12/22/19  Yes [name], MD  carvedilol (COREG) 6.25 MG tablet Take 1 tablet (6.25 mg total) by mouth 2 (two) times daily with a meal. 01/30/20  Yes Ennever, Rudell Cobb, MD  Cholecalciferol (VITAMIN D-3) 125 MCG (5000 UT) TABS Take 5,000 Units by mouth daily.    Yes [provider]  Cyanocobalamin (VITAMIN B-12 PO) Take 500 mcg by mouth daily.    Yes [provider]  folic acid (FOLVITE) 884 MCG tablet Take 800 mcg by mouth daily.    Yes [provider]  gabapentin (NEURONTIN) 300 MG capsule Take 1 capsule (300 mg total) by mouth  2 (two) times daily. 01/16/20  Yes Mercy Riding, MD  hydroxypropyl methylcellulose / hypromellose (ISOPTO TEARS / GONIOVISC) 2.5 % ophthalmic solution Place 1 drop into the left eye 3 (three) times daily. Patient taking differently: Place 1 drop into the left eye 3 (three) times daily. Per daughter, she also uses it in the right eye but not often. Just as needed. 02/01/21  Yes Elodia Florence., MD  imatinib (GLEEVEC) 400 MG tablet Take 1 tablet (400 mg total) by mouth daily. Take with meals and large glass of water.Caution:Chemotherapy 04/22/21  Yes Volanda Napoleon, MD  losartan (COZAAR) 25 MG tablet Take 25 mg by mouth daily.   Yes [provider]  ondansetron (ZOFRAN) 8 MG tablet Take 1 tablet (8 mg total) by mouth every 8 (eight) hours as needed for nausea or vomiting. 03/15/20  Yes Ennever, Rudell Cobb, MD  OVER THE COUNTER MEDICATION Apply 1 application topically See admin instructions. HempVana cream - as needed for knee and shoulder cream   Yes [provider]  pantoprazole (PROTONIX) 40 MG tablet TAKE 1 TABLET(40 MG) BY MOUTH TWICE DAILY Patient taking differently: Take 40 mg by mouth in the morning and at bedtime. 09/22/20  Yes Ennever, Rudell Cobb, MD  SYNTHROID 50 MCG tablet Take 50 mcg by mouth daily before breakfast.   Yes [provider]  trolamine salicylate (ASPERCREME) 10 % cream Apply 1 application topically as needed for muscle pain.   Yes [provider]  vitamin E 180 MG (400 UNITS) capsule Take 400 Units by mouth daily.   Yes [provider]  zinc gluconate 50 MG tablet Take 50 mg by mouth daily.   Yes [provider]    Iona Beard, MD Internal medicine, PGY-2 2021/07/11 7:40 AM Pager [phone]

## 2021-07-04 NOTE — Progress Notes (Signed)
Physical Therapy Discharge Patient Details Name: Monique Lin MRN: [id number] DOB: May 15, 1927 Today's Date: 07/28/21 Time:  -     Patient discharged from PT services secondary to medical decline - will need to re-order PT to resume therapy services.  Please see latest therapy progress note for current level of functioning and progress toward goals.    Progress and discharge plan discussed with patient and/or caregiver: Patient unable to participate in discharge planning and no caregivers available  GP     [name] 2021-07-28, 9:21 AM  Hudson Lehmkuhl M,PT Acute Rehab Services [phone] (pager)

## 2021-07-04 NOTE — Plan of Care (Signed)
Performing ERCP remains highly risky enterprise, and even if done not sure patient will sustain any type of meaningful quality of life afterwards, notwithstanding the multiple other issues.  Based on slow downtrend of LFTs, appears that GB drain may be decompressing things some, though her WBC continues to rise.  I relayed to daughter yesterday to let us know whether she wanted to consider ERCP (and PCCM team talked about whether she wanted to pursue dialysis), and daughter was undecided and I asked her to let us know when/if she wanted [place] to further consider pursuing these options.  At this time, I am more inclined to pursue ongoing medical management and, especially given her evolving renal failure, I would be more inclinded to introduce possible palliative care options.    [facility] will follow.

## 2021-07-04 NOTE — Procedures (Signed)
Cortrak  Person Inserting Tube:  Esaw Dace, RD Tube Type:  Cortrak - 43 inches Tube Size:  10 Tube Location:  Right nare Initial Placement:  Stomach Secured by: Bridle Technique Used to Measure Tube Placement:  Marking at nare/corner of mouth Cortrak Secured At:  70 cm  .Cortrak Tube Team Note:  Consult received to place a Cortrak feeding tube.   X-ray is required, abdominal x-ray has been ordered by the Cortrak team. Please confirm tube placement before using the Cortrak tube.   If the tube becomes dislodged please keep the tube and contact the Cortrak team at [email or web address] (password [email or web address]) for replacement.  If after hours and replacement cannot be delayed, place a NG tube and confirm placement with an abdominal x-ray.    Kerman Passey MS, RDN, LDN, CNSC Registered Dietitian III Clinical Nutrition RD Pager and On-Call Pager Number Located in [place]

## 2021-07-04 DEATH — deceased

## 2021-07-15 ENCOUNTER — Inpatient Hospital Stay: Payer: Medicare Other | Admitting: Hematology & Oncology

## 2021-07-15 ENCOUNTER — Inpatient Hospital Stay: Payer: Medicare Other

## 2021-10-19 ENCOUNTER — Ambulatory Visit: Payer: Medicare Other | Admitting: Adult Health

## 2022-07-27 IMAGING — CT CT CHEST-ABD-PELV W/ CM
3 of 5 series · 15 of 36 positions shown, 17 images · IV contrast (Omnipaque)
Comparison: CT 08/13/2020

CLINICAL DATA: Recurrent gastrointestinal stromal tumor

EXAM:
CT CHEST, ABDOMEN, AND PELVIS WITH CONTRAST
TECHNIQUE: Multidetector CT imaging of the chest, abdomen and pelvis was
performed following the standard protocol during bolus
administration of intravenous contrast.
CONTRAST:  90mL OMNIPAQUE IOHEXOL 300 MG/ML  SOLN

[Series 3: cap with 2 · axial · 0.84mm/px · z∈[-368,+98]mm · 9 of 117 slices shown, 11 images]
[im 12/117  mediastinal]
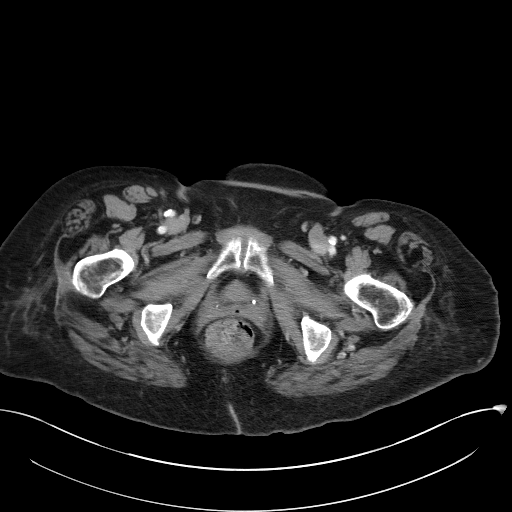
[im 12/117  bone]
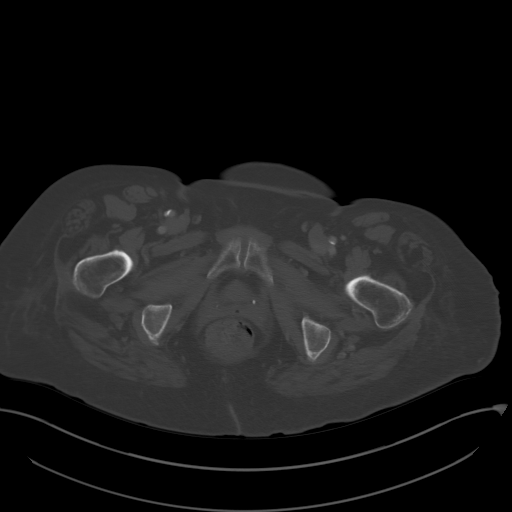
[im 24/117  mediastinal]
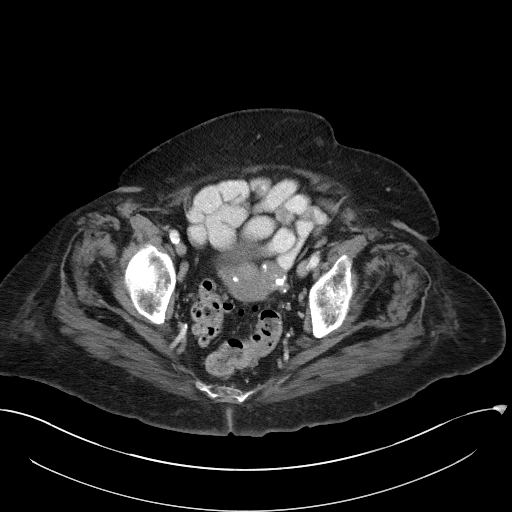
[im 35/117  mediastinal]
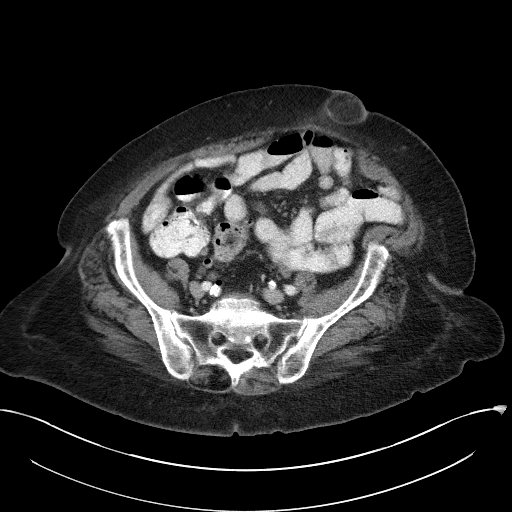
[im 47/117  mediastinal]
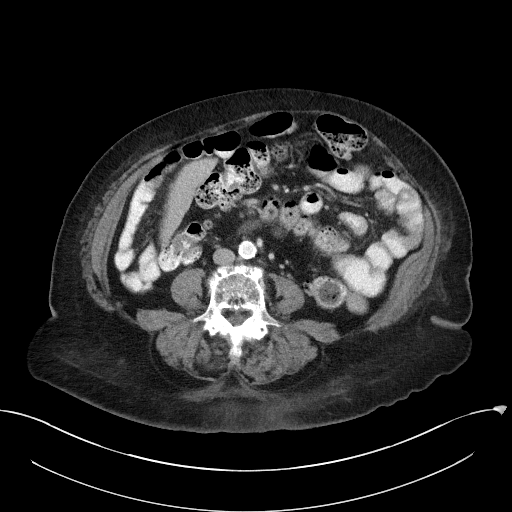
[im 59/117  mediastinal]
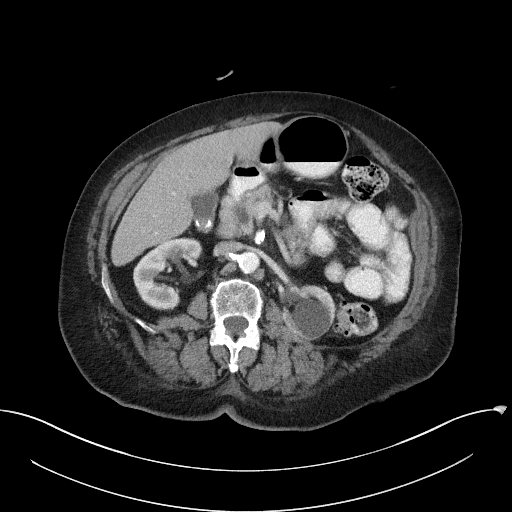
[im 70/117  mediastinal]
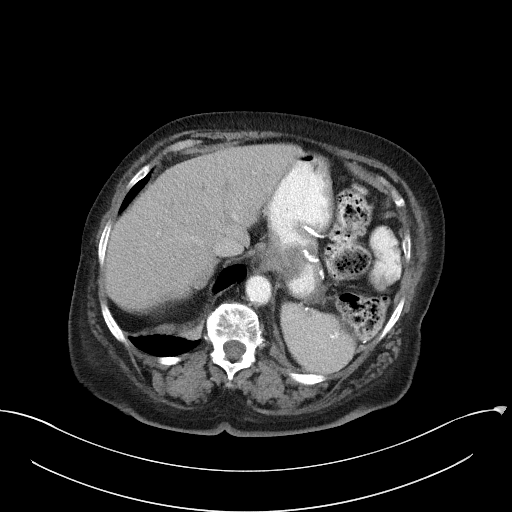
[im 82/117  mediastinal]
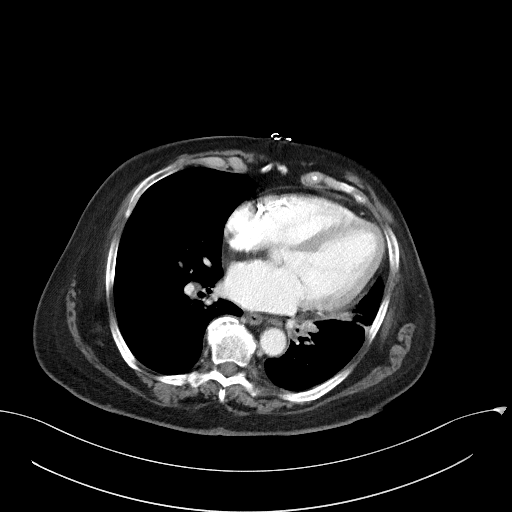
[im 93/117  mediastinal]
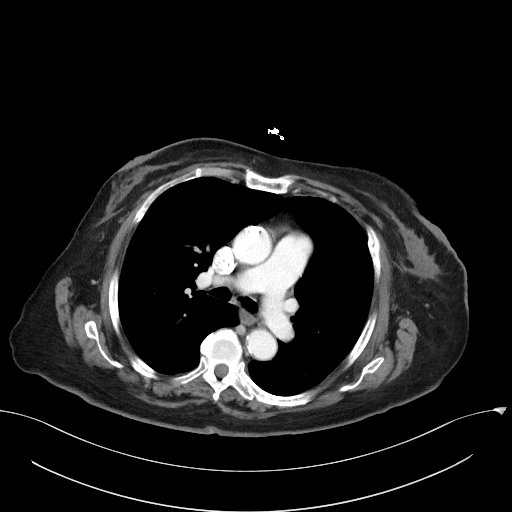
[im 105/117  mediastinal]
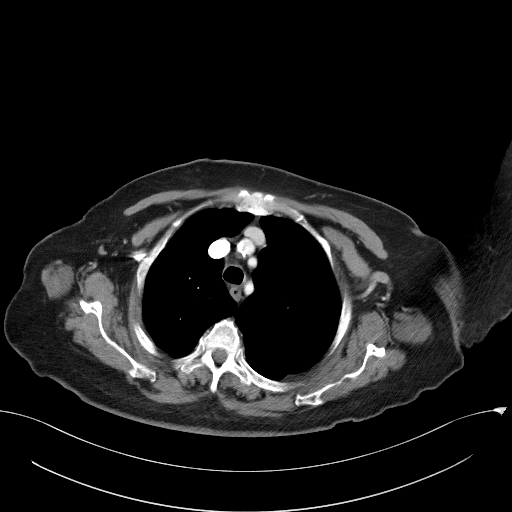
[im 105/117  bone]
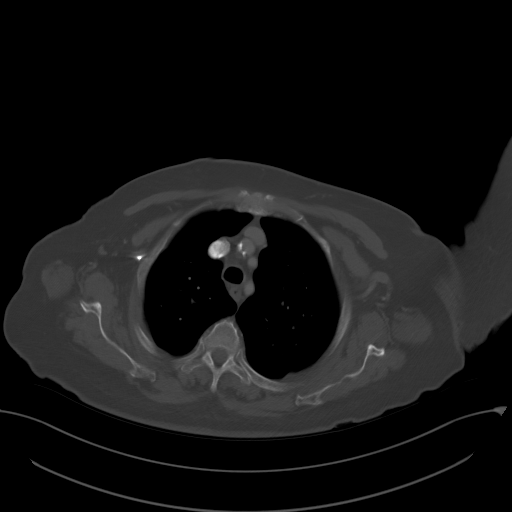

[Series 5: coronals · coronal · 1.00mm/px · 3 of 155 slices shown]
[im 31/155  mediastinal]
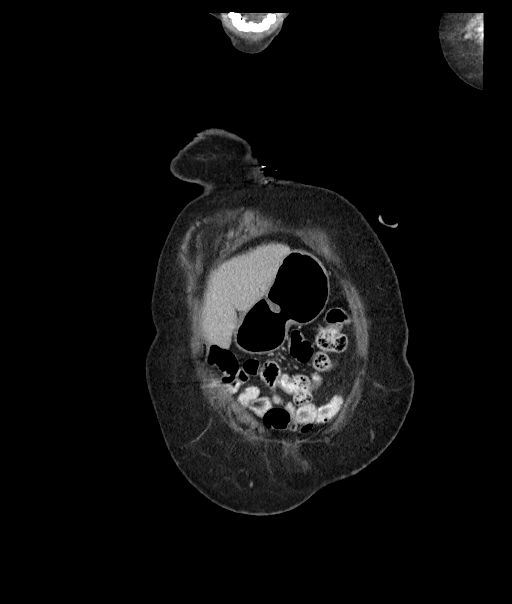
[im 62/155  mediastinal]
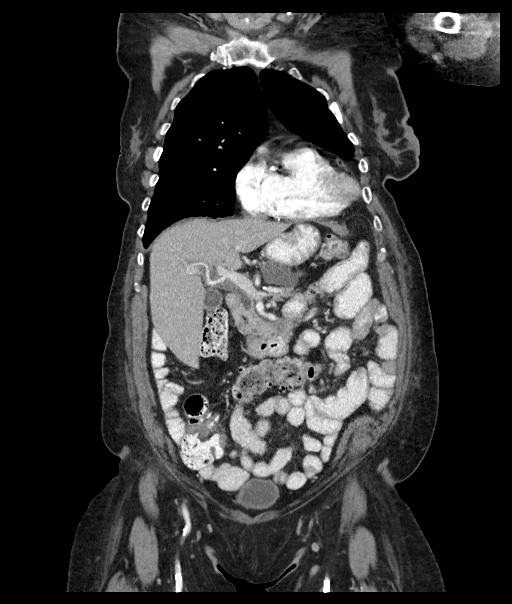
[im 93/155  mediastinal]
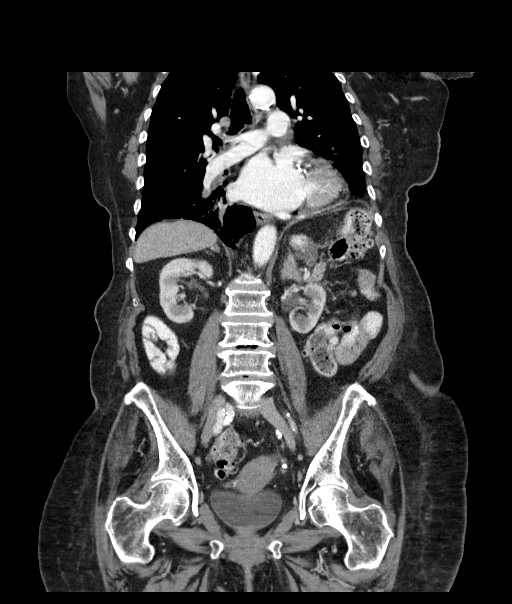

[Series 6: lung · axial · 0.84mm/px · z∈[-128,-56]mm · 3 of 155 slices shown]
[im 12/155  bone]
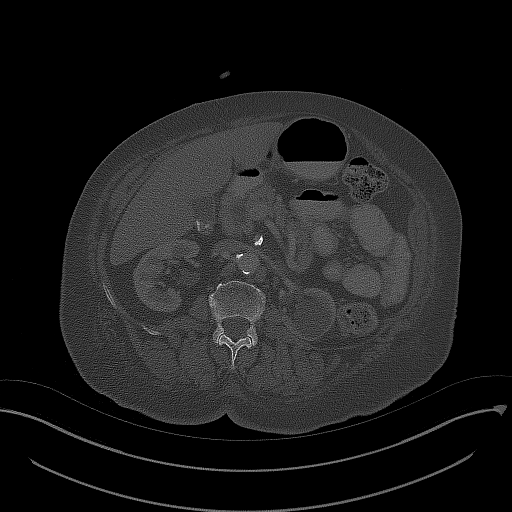
[im 36/155  bone]
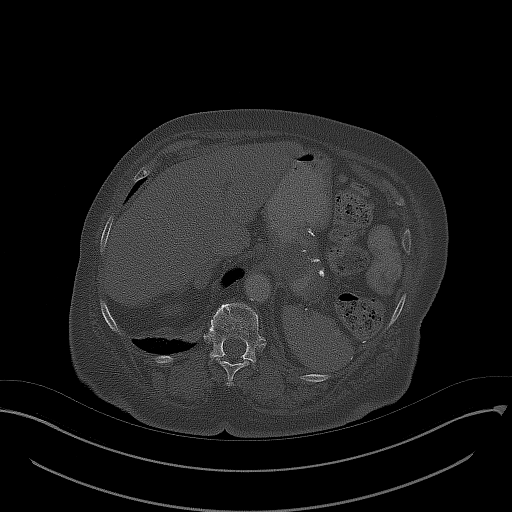
[im 48/155  bone]
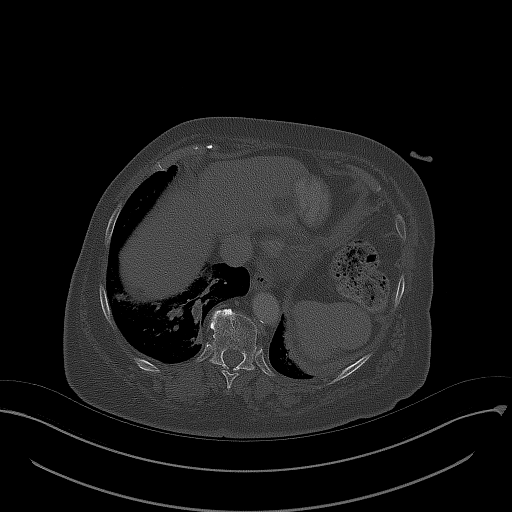

[15 of 36 positions shown; findings below may reference images not displayed]

FINDINGS: CT CHEST FINDINGS

Cardiovascular: Coronary artery calcification and aortic
atherosclerotic calcification.

Mediastinum/Nodes: No axillary or supraclavicular adenopathy. No
mediastinal or hilar adenopathy. No pericardial fluid. Esophagus
normal.

Lungs/Pleura: Peribronchial thickening and atelectasis in the LEFT
lower lobe is similar prior. Bibasilar mucous plugging additionally
a similar prior. (Image 104/6)

Musculoskeletal: No aggressive osseous lesion.

CT ABDOMEN AND PELVIS FINDINGS

Hepatobiliary: No focal hepatic lesion. Small gallstones. No biliary
duct dilatation. Within the distal aspect of the common bile duct
there is a round density measuring 6 mm (image 62/3). This is not
changed from comparison exam no pancreatic duct dilatation.

Pancreas: No pancreatic duct dilatation in the pancreas. There is an
oblong mass along the body of the pancreas measuring 7.8 by 5.0 cm
compared to 7.1 by 5.6 cm. Visually lesion appears very similar.
This lesion abuts the greater curvature of the stomach as well as
the pancreas.

Spleen: Normal spleen

Adrenals/urinary tract: Thickened adrenal glands similar prior.
Benign-appearing cyst of the LEFT kidney. No obstructive uropathy.
Bladder normal

Stomach/Bowel: Along the greater curvature of stomach there is a
mass which appears intimately associated with the serosal surface of
the stomach (image 81/5). The mass approximates the pancreasbut does
not appear to involve the pancreas seen on coronal imaging 79/35.
Postsurgical changes in the stomach. No evidence of gastric outlet
obstruction. Stomach, duodenum small-bowel normal. The colon and
rectosigmoid colon normal. No bowel obstruction.

Vascular/Lymphatic: Abdominal aorta is normal caliber. There is no
retroperitoneal or periportal lymphadenopathy. No pelvic
lymphadenopathy.

Reproductive: Uterus and adnexa unremarkable.

Other: Small fat filled ventral hernia.  No change from prior

Musculoskeletal: No aggressive osseous lesion.
IMPRESSION: Chest Impression:

1. Bibasilar mucous plugging, bronchiectasis and LEFT lobe
atelectasis not changed from prior. Findings suggest chronic
pulmonary infection or aspiration.
2. No metastatic adenopathy.
3. Coronary artery calcification and Aortic Atherosclerosis
([id number]).

Abdomen / Pelvis Impression:

1. Oblong mass along the greater curvature the stomach not changed
significantly from comparison exam.
2. No evidence of metastatic adenopathy in the abdomen pelvis.
3. Round lesion in the distal common bile duct suggests a common
bile duct stone. Recommend correlation with bilirubin levels. No
change from prior.
4. Overall no interval change from comparison exam 08/13/2020.

## 2022-10-08 IMAGING — CT CT HEAD W/O CM
3 series · 15 of 47 positions shown, 18 images · non-contrast
Comparison: 04/01/2018

CLINICAL DATA: Facial droop

EXAM:
CT HEAD WITHOUT CONTRAST
TECHNIQUE: Contiguous axial images were obtained from the base of the skull
through the vertex without intravenous contrast.

[Series 3: head 5.0 h30s · axial · 0.45mm/px · z∈[-90,+35]mm · 9 of 31 slices shown, 12 images]
[im 3/31  brain]
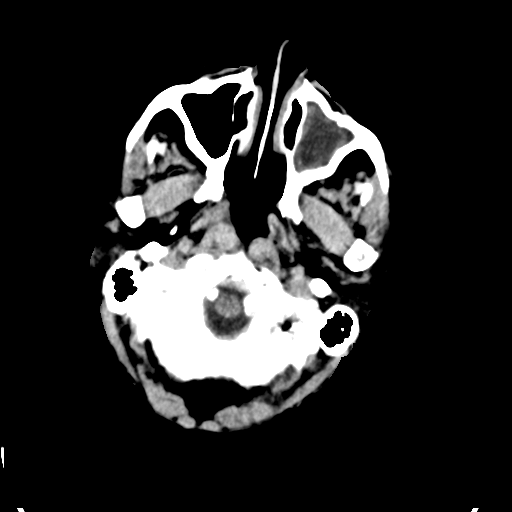
[im 3/31  bone]
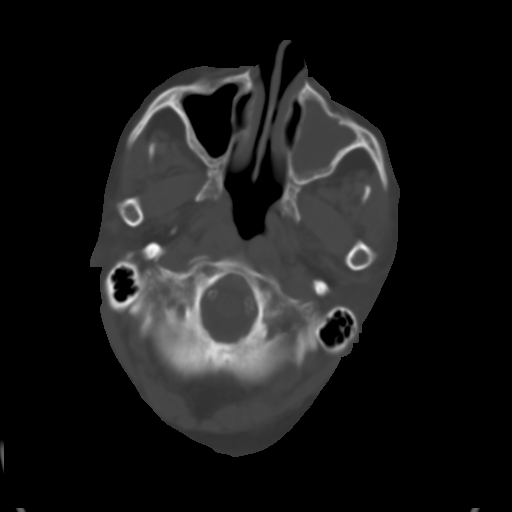
[im 6/31  brain]
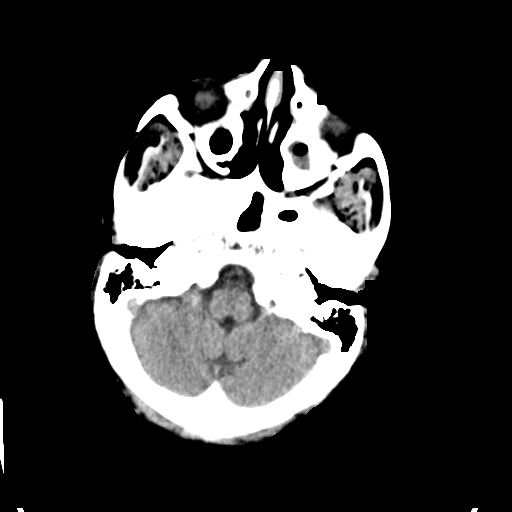
[im 9/31  brain]
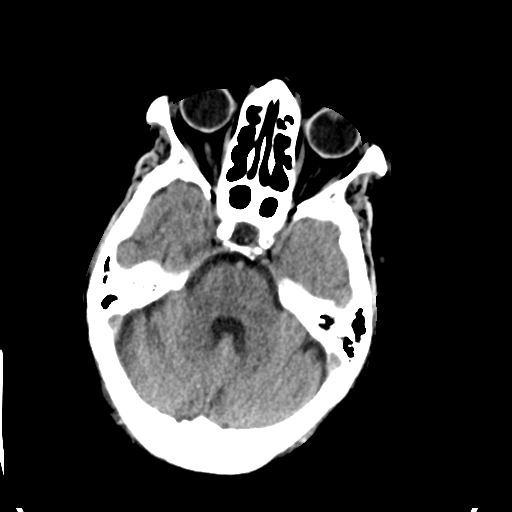
[im 12/31  brain]
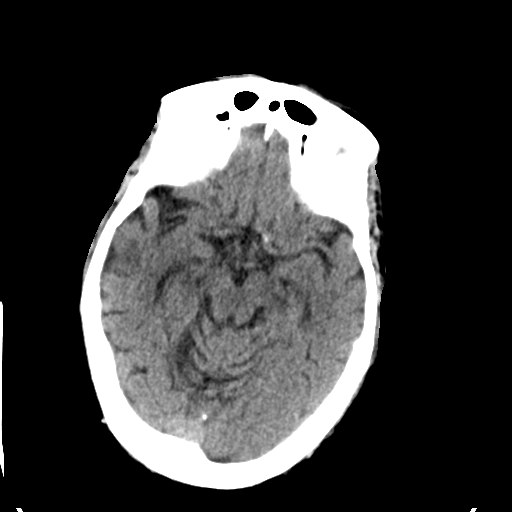
[im 16/31  brain]
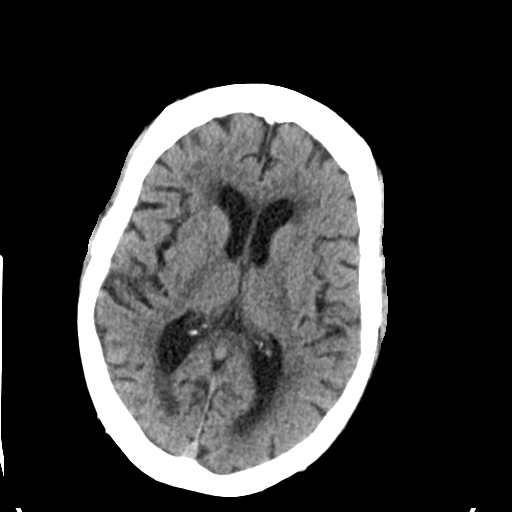
[im 16/31  bone]
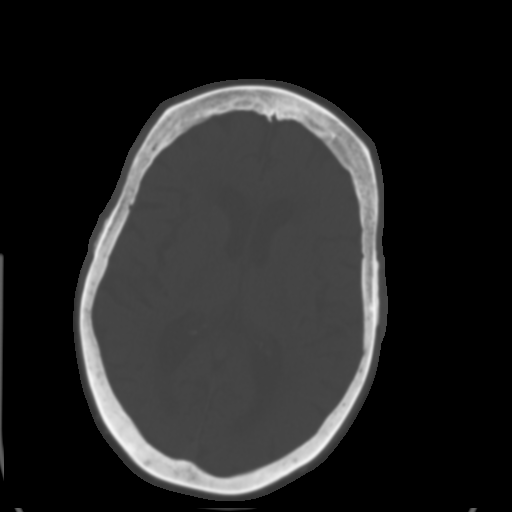
[im 19/31  brain]
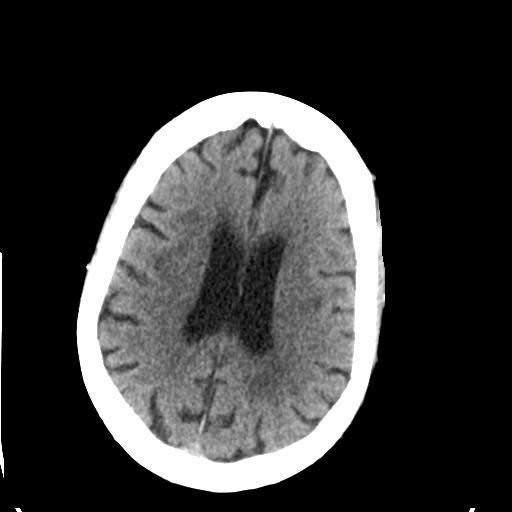
[im 22/31  brain]
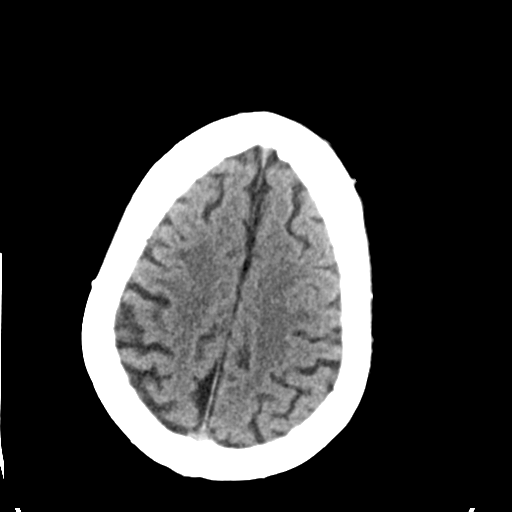
[im 25/31  brain]
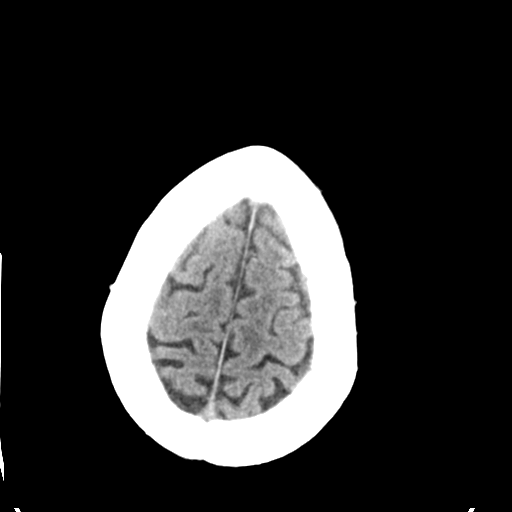
[im 28/31  brain]
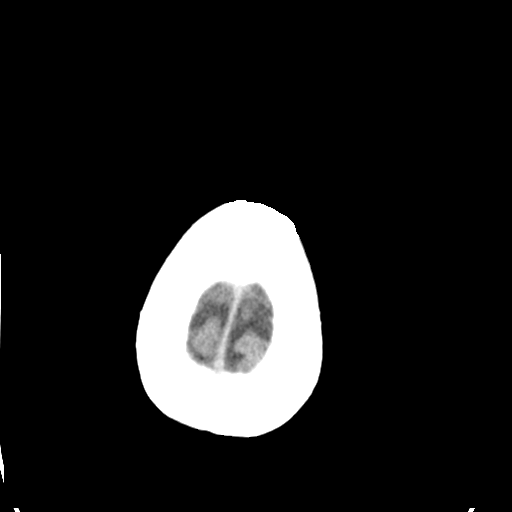
[im 28/31  bone]
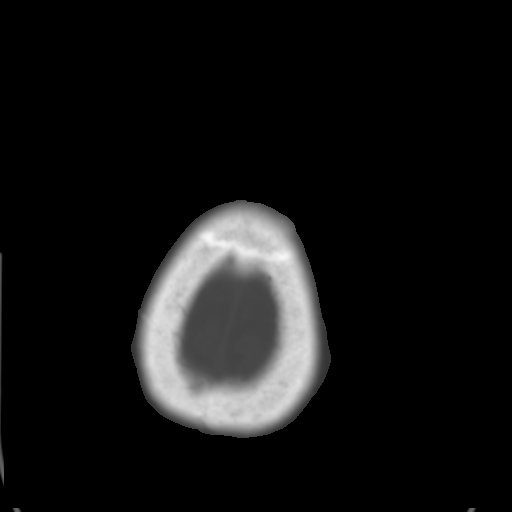

[Series 5: head 3.0 mpr cor · coronal · 0.30mm/px · 3 of 67 slices shown]
[im 23/67  brain]
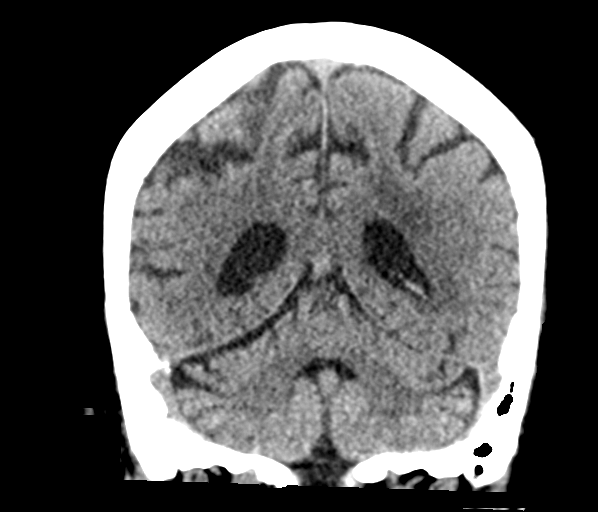
[im 30/67  brain]
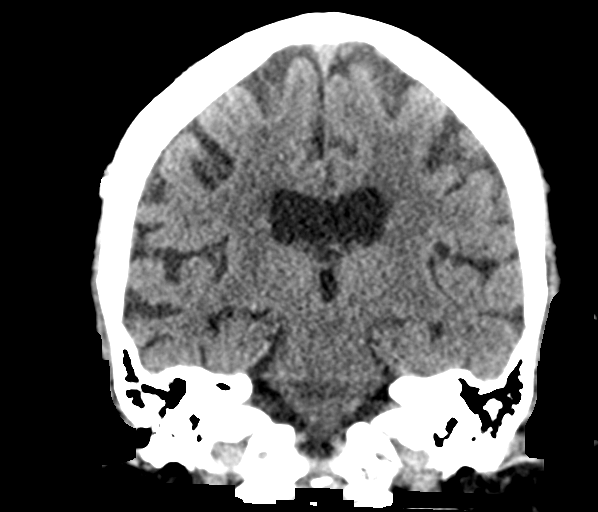
[im 37/67  brain]
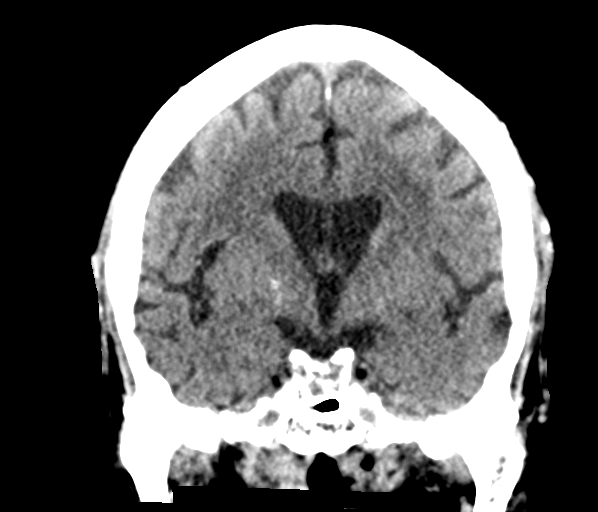

[Series 6: head 3.0 mpr sag · sagittal · 0.30mm/px · 3 of 66 slices shown]
[im 22/66  brain]
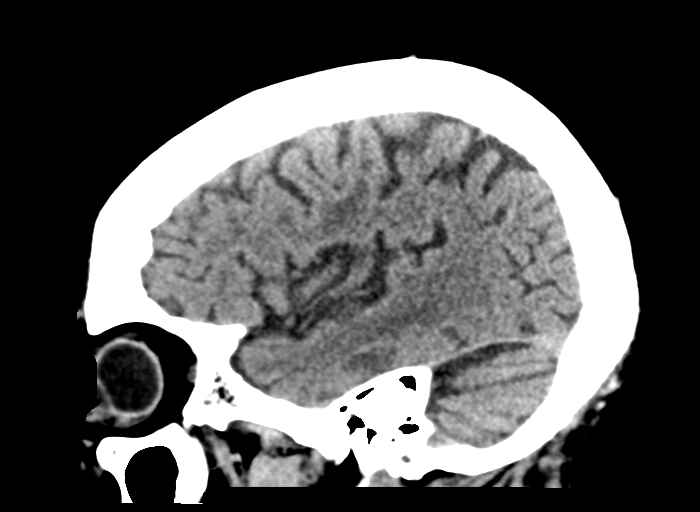
[im 33/66  brain]
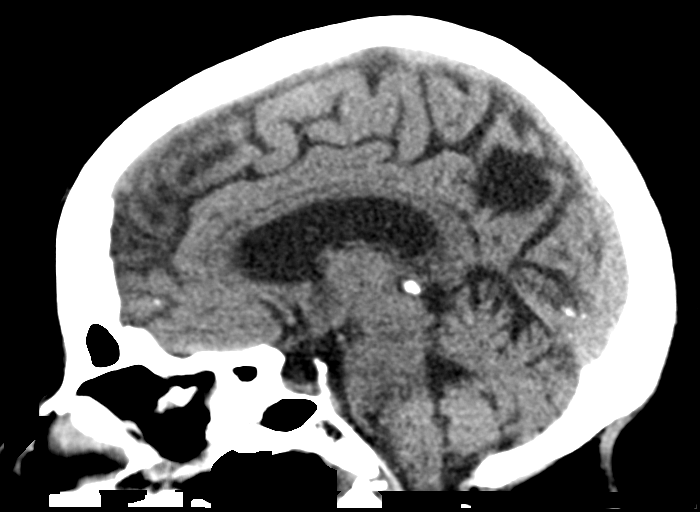
[im 44/66  brain]
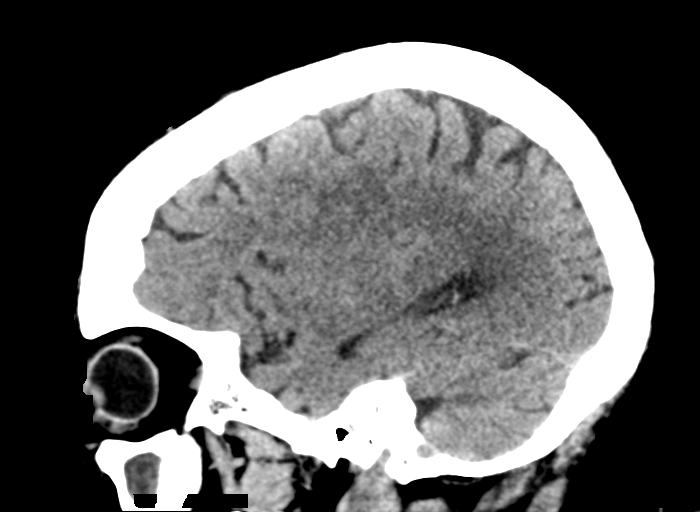

[15 of 47 positions shown; findings below may reference images not displayed]

FINDINGS: Brain: There is atrophy and chronic small vessel disease changes. No
acute intracranial abnormality. Specifically, no hemorrhage,
hydrocephalus, mass lesion, acute infarction, or significant
intracranial injury.

Vascular: No hyperdense vessel or unexpected calcification.

Skull: No acute calvarial abnormality.

Sinuses/Orbits: Near complete opacification of the left maxillary
sinus. Mucosal thickening throughout the paranasal sinuses. No acute
findings. Mastoid air cells clear.

Other: None
IMPRESSION: Atrophy, chronic microvascular disease.

No acute intracranial abnormality.

Chronic sinusitis.
